# Patient Record
Sex: Female | Born: 1940 | Race: White | Hispanic: No | Smoking: Former smoker
Health system: Southern US, Community
[De-identification: ages and names within clinical notes are randomized; demographics above are authoritative.]

## PROBLEM LIST (undated history)

## (undated) DIAGNOSIS — J439 Emphysema, unspecified: Secondary | ICD-10-CM

## (undated) DIAGNOSIS — Z9981 Dependence on supplemental oxygen: Secondary | ICD-10-CM

## (undated) DIAGNOSIS — F329 Major depressive disorder, single episode, unspecified: Secondary | ICD-10-CM

## (undated) DIAGNOSIS — I10 Essential (primary) hypertension: Secondary | ICD-10-CM

## (undated) DIAGNOSIS — IMO0002 Reserved for concepts with insufficient information to code with codable children: Secondary | ICD-10-CM

## (undated) DIAGNOSIS — H9190 Unspecified hearing loss, unspecified ear: Secondary | ICD-10-CM

## (undated) DIAGNOSIS — C52 Malignant neoplasm of vagina: Secondary | ICD-10-CM

## (undated) DIAGNOSIS — K219 Gastro-esophageal reflux disease without esophagitis: Secondary | ICD-10-CM

## (undated) DIAGNOSIS — K449 Diaphragmatic hernia without obstruction or gangrene: Secondary | ICD-10-CM

## (undated) DIAGNOSIS — J449 Chronic obstructive pulmonary disease, unspecified: Secondary | ICD-10-CM

## (undated) DIAGNOSIS — R569 Unspecified convulsions: Secondary | ICD-10-CM

## (undated) DIAGNOSIS — D649 Anemia, unspecified: Secondary | ICD-10-CM

## (undated) DIAGNOSIS — E669 Obesity, unspecified: Secondary | ICD-10-CM

## (undated) DIAGNOSIS — F32A Depression, unspecified: Secondary | ICD-10-CM

## (undated) DIAGNOSIS — IMO0001 Reserved for inherently not codable concepts without codable children: Secondary | ICD-10-CM

## (undated) DIAGNOSIS — E785 Hyperlipidemia, unspecified: Secondary | ICD-10-CM

## (undated) DIAGNOSIS — I639 Cerebral infarction, unspecified: Secondary | ICD-10-CM

## (undated) DIAGNOSIS — K759 Inflammatory liver disease, unspecified: Secondary | ICD-10-CM

## (undated) DIAGNOSIS — K2270 Barrett's esophagus without dysplasia: Secondary | ICD-10-CM

## (undated) DIAGNOSIS — J189 Pneumonia, unspecified organism: Secondary | ICD-10-CM

## (undated) DIAGNOSIS — Z5189 Encounter for other specified aftercare: Secondary | ICD-10-CM

## (undated) DIAGNOSIS — M1990 Unspecified osteoarthritis, unspecified site: Secondary | ICD-10-CM

## (undated) DIAGNOSIS — G4730 Sleep apnea, unspecified: Secondary | ICD-10-CM

## (undated) DIAGNOSIS — T7840XA Allergy, unspecified, initial encounter: Secondary | ICD-10-CM

## (undated) HISTORY — DX: Gastro-esophageal reflux disease without esophagitis: K21.9

## (undated) HISTORY — DX: Essential (primary) hypertension: I10

## (undated) HISTORY — DX: Unspecified convulsions: R56.9

## (undated) HISTORY — DX: Chronic obstructive pulmonary disease, unspecified: J44.9

## (undated) HISTORY — DX: Depression, unspecified: F32.A

## (undated) HISTORY — DX: Diaphragmatic hernia without obstruction or gangrene: K44.9

## (undated) HISTORY — DX: Barrett's esophagus without dysplasia: K22.70

## (undated) HISTORY — DX: Inflammatory liver disease, unspecified: K75.9

## (undated) HISTORY — DX: Major depressive disorder, single episode, unspecified: F32.9

## (undated) HISTORY — PX: CHOLECYSTECTOMY: SHX55

## (undated) HISTORY — DX: Hyperlipidemia, unspecified: E78.5

## (undated) HISTORY — PX: HEMORRHOID SURGERY: SHX153

## (undated) HISTORY — DX: Reserved for concepts with insufficient information to code with codable children: IMO0002

## (undated) HISTORY — DX: Emphysema, unspecified: J43.9

## (undated) HISTORY — DX: Encounter for other specified aftercare: Z51.89

## (undated) HISTORY — DX: Pneumonia, unspecified organism: J18.9

## (undated) HISTORY — DX: Unspecified osteoarthritis, unspecified site: M19.90

## (undated) HISTORY — PX: ABDOMINAL HYSTERECTOMY: SHX81

## (undated) HISTORY — DX: Obesity, unspecified: E66.9

## (undated) HISTORY — DX: Cerebral infarction, unspecified: I63.9

## (undated) HISTORY — PX: JOINT REPLACEMENT: SHX530

## (undated) HISTORY — PX: TOTAL HIP ARTHROPLASTY: SHX124

## (undated) HISTORY — PX: OTHER SURGICAL HISTORY: SHX169

## (undated) HISTORY — DX: Sleep apnea, unspecified: G47.30

## (undated) HISTORY — DX: Reserved for inherently not codable concepts without codable children: IMO0001

## (undated) HISTORY — DX: Malignant neoplasm of vagina: C52

## (undated) HISTORY — PX: ORIF HIP FRACTURE: SHX2125

## (undated) HISTORY — DX: Allergy, unspecified, initial encounter: T78.40XA

## (undated) HISTORY — DX: Anemia, unspecified: D64.9

---

## 1996-07-28 HISTORY — PX: OTHER SURGICAL HISTORY: SHX169

## 2003-05-23 ENCOUNTER — Inpatient Hospital Stay (HOSPITAL_COMMUNITY): Admission: EM | Admit: 2003-05-23 | Discharge: 2003-06-02 | Payer: Self-pay | Admitting: *Deleted

## 2003-07-04 ENCOUNTER — Ambulatory Visit (HOSPITAL_COMMUNITY): Admission: RE | Admit: 2003-07-04 | Discharge: 2003-07-04 | Payer: Self-pay | Admitting: Endocrinology

## 2003-10-27 ENCOUNTER — Emergency Department (HOSPITAL_COMMUNITY): Admission: EM | Admit: 2003-10-27 | Discharge: 2003-10-27 | Payer: Self-pay | Admitting: Emergency Medicine

## 2004-07-18 ENCOUNTER — Ambulatory Visit: Payer: Self-pay | Admitting: Internal Medicine

## 2004-07-18 ENCOUNTER — Inpatient Hospital Stay (HOSPITAL_COMMUNITY): Admission: AD | Admit: 2004-07-18 | Discharge: 2004-07-21 | Payer: Self-pay | Admitting: Internal Medicine

## 2004-07-19 ENCOUNTER — Encounter (INDEPENDENT_AMBULATORY_CARE_PROVIDER_SITE_OTHER): Payer: Self-pay | Admitting: *Deleted

## 2004-07-19 ENCOUNTER — Encounter: Payer: Self-pay | Admitting: Gastroenterology

## 2004-07-19 ENCOUNTER — Encounter: Payer: Self-pay | Admitting: Internal Medicine

## 2004-07-19 DIAGNOSIS — K2270 Barrett's esophagus without dysplasia: Secondary | ICD-10-CM

## 2004-07-19 HISTORY — DX: Barrett's esophagus without dysplasia: K22.70

## 2004-07-20 ENCOUNTER — Encounter: Payer: Self-pay | Admitting: Internal Medicine

## 2004-08-01 ENCOUNTER — Ambulatory Visit: Payer: Self-pay | Admitting: Internal Medicine

## 2004-08-23 ENCOUNTER — Ambulatory Visit: Payer: Self-pay | Admitting: Internal Medicine

## 2004-11-22 ENCOUNTER — Ambulatory Visit: Payer: Self-pay | Admitting: Internal Medicine

## 2004-11-28 ENCOUNTER — Ambulatory Visit: Payer: Self-pay | Admitting: Internal Medicine

## 2004-12-16 ENCOUNTER — Ambulatory Visit: Payer: Self-pay | Admitting: Internal Medicine

## 2005-01-02 ENCOUNTER — Encounter: Admission: RE | Admit: 2005-01-02 | Discharge: 2005-01-02 | Payer: Self-pay | Admitting: Orthopedic Surgery

## 2005-01-08 ENCOUNTER — Ambulatory Visit: Payer: Self-pay | Admitting: Internal Medicine

## 2005-01-16 ENCOUNTER — Encounter: Admission: RE | Admit: 2005-01-16 | Discharge: 2005-01-16 | Payer: Self-pay | Admitting: Orthopedic Surgery

## 2005-02-21 ENCOUNTER — Ambulatory Visit: Payer: Self-pay | Admitting: Internal Medicine

## 2005-03-25 ENCOUNTER — Ambulatory Visit: Payer: Self-pay | Admitting: Internal Medicine

## 2005-05-29 ENCOUNTER — Ambulatory Visit: Payer: Self-pay | Admitting: Internal Medicine

## 2005-06-30 ENCOUNTER — Ambulatory Visit: Payer: Self-pay | Admitting: Internal Medicine

## 2005-07-02 ENCOUNTER — Encounter: Admission: RE | Admit: 2005-07-02 | Discharge: 2005-07-02 | Payer: Self-pay | Admitting: Internal Medicine

## 2005-07-11 ENCOUNTER — Ambulatory Visit: Payer: Self-pay | Admitting: Internal Medicine

## 2005-08-13 ENCOUNTER — Ambulatory Visit: Payer: Self-pay | Admitting: Internal Medicine

## 2005-08-22 ENCOUNTER — Ambulatory Visit: Payer: Self-pay | Admitting: Internal Medicine

## 2005-09-10 ENCOUNTER — Ambulatory Visit: Payer: Self-pay | Admitting: Internal Medicine

## 2005-09-24 ENCOUNTER — Ambulatory Visit: Payer: Self-pay | Admitting: Internal Medicine

## 2005-11-25 ENCOUNTER — Ambulatory Visit: Payer: Self-pay | Admitting: Internal Medicine

## 2005-11-28 ENCOUNTER — Ambulatory Visit: Payer: Self-pay

## 2005-12-04 ENCOUNTER — Ambulatory Visit (HOSPITAL_COMMUNITY): Admission: RE | Admit: 2005-12-04 | Discharge: 2005-12-04 | Payer: Self-pay | Admitting: Internal Medicine

## 2005-12-12 ENCOUNTER — Ambulatory Visit: Payer: Self-pay | Admitting: Internal Medicine

## 2005-12-12 ENCOUNTER — Inpatient Hospital Stay (HOSPITAL_COMMUNITY): Admission: RE | Admit: 2005-12-12 | Discharge: 2005-12-24 | Payer: Self-pay | Admitting: Orthopedic Surgery

## 2006-02-27 ENCOUNTER — Ambulatory Visit: Payer: Self-pay | Admitting: Internal Medicine

## 2006-04-21 ENCOUNTER — Ambulatory Visit: Payer: Self-pay | Admitting: Internal Medicine

## 2006-05-04 ENCOUNTER — Ambulatory Visit: Payer: Self-pay | Admitting: Internal Medicine

## 2006-05-04 DIAGNOSIS — M810 Age-related osteoporosis without current pathological fracture: Secondary | ICD-10-CM

## 2006-05-04 DIAGNOSIS — K219 Gastro-esophageal reflux disease without esophagitis: Secondary | ICD-10-CM

## 2006-05-04 DIAGNOSIS — I10 Essential (primary) hypertension: Secondary | ICD-10-CM | POA: Insufficient documentation

## 2006-05-04 DIAGNOSIS — R569 Unspecified convulsions: Secondary | ICD-10-CM | POA: Insufficient documentation

## 2006-05-04 DIAGNOSIS — E785 Hyperlipidemia, unspecified: Secondary | ICD-10-CM

## 2006-05-04 DIAGNOSIS — F329 Major depressive disorder, single episode, unspecified: Secondary | ICD-10-CM

## 2006-07-14 ENCOUNTER — Ambulatory Visit: Payer: Self-pay | Admitting: Internal Medicine

## 2006-07-14 LAB — CONVERTED CEMR LAB
ALT: 10 units/L (ref 0–40)
AST: 18 units/L (ref 0–37)
Albumin: 3.4 g/dL — ABNORMAL LOW (ref 3.5–5.2)
Alkaline Phosphatase: 75 units/L (ref 39–117)
BUN: 13 mg/dL (ref 6–23)
Basophils Absolute: 0.1 10*3/uL (ref 0.0–0.1)
Basophils Relative: 0.6 % (ref 0.0–1.0)
Bilirubin, Direct: 0.1 mg/dL (ref 0.0–0.3)
CO2: 34 meq/L — ABNORMAL HIGH (ref 19–32)
Calcium: 9.2 mg/dL (ref 8.4–10.5)
Chloride: 94 meq/L — ABNORMAL LOW (ref 96–112)
Chol/HDL Ratio, serum: 7.9
Cholesterol: 254 mg/dL (ref 0–200)
Creatinine, Ser: 0.8 mg/dL (ref 0.4–1.2)
Eosinophil percent: 1.6 % (ref 0.0–5.0)
GFR calc non Af Amer: 77 mL/min
Glomerular Filtration Rate, Af Am: 93 mL/min/{1.73_m2}
Glucose, Bld: 102 mg/dL — ABNORMAL HIGH (ref 70–99)
HCT: 37.7 % (ref 36.0–46.0)
HDL: 32.2 mg/dL — ABNORMAL LOW (ref >39.0)
Hemoglobin: 12.1 g/dL (ref 12.0–15.0)
LDL DIRECT: 141.5 mg/dL
Lymphocytes Relative: 33.7 % (ref 12.0–46.0)
MCHC: 32.2 g/dL (ref 30.0–36.0)
MCV: 92.4 fL (ref 78.0–100.0)
Monocytes Absolute: 0.8 10*3/uL — ABNORMAL HIGH (ref 0.2–0.7)
Monocytes Relative: 8.2 % (ref 3.0–11.0)
Neutro Abs: 5.2 10*3/uL (ref 1.4–7.7)
Neutrophils Relative %: 55.9 % (ref 43.0–77.0)
Platelets: 330 10*3/uL (ref 150–400)
Potassium: 3 meq/L — ABNORMAL LOW (ref 3.5–5.1)
RBC: 4.08 M/uL (ref 3.87–5.11)
RDW: 14 % (ref 11.5–14.6)
Sodium: 139 meq/L (ref 135–145)
Total Bilirubin: 0.5 mg/dL (ref 0.3–1.2)
Total Protein: 6.6 g/dL (ref 6.0–8.3)
Triglyceride fasting, serum: 384 mg/dL (ref 0–149)
VLDL: 77 mg/dL — ABNORMAL HIGH (ref 0–40)
WBC: 9.5 10*3/uL (ref 4.5–10.5)

## 2006-11-10 ENCOUNTER — Ambulatory Visit: Payer: Self-pay | Admitting: Internal Medicine

## 2006-12-18 ENCOUNTER — Ambulatory Visit: Payer: Self-pay | Admitting: Internal Medicine

## 2007-02-06 ENCOUNTER — Emergency Department (HOSPITAL_COMMUNITY): Admission: EM | Admit: 2007-02-06 | Discharge: 2007-02-06 | Payer: Self-pay | Admitting: Emergency Medicine

## 2007-03-08 ENCOUNTER — Encounter: Payer: Self-pay | Admitting: Internal Medicine

## 2007-03-15 ENCOUNTER — Ambulatory Visit: Payer: Self-pay | Admitting: Internal Medicine

## 2007-03-18 ENCOUNTER — Ambulatory Visit: Payer: Self-pay | Admitting: Cardiology

## 2007-07-08 ENCOUNTER — Ambulatory Visit: Payer: Self-pay | Admitting: Internal Medicine

## 2007-07-13 LAB — CONVERTED CEMR LAB
ALT: 10 units/L (ref 0–35)
AST: 17 units/L (ref 0–37)
Albumin: 3.8 g/dL (ref 3.5–5.2)
Alkaline Phosphatase: 70 units/L (ref 39–117)
BUN: 17 mg/dL (ref 6–23)
Basophils Absolute: 0.1 10*3/uL (ref 0.0–0.1)
Basophils Relative: 0.6 % (ref 0.0–1.0)
Bilirubin, Direct: 0.1 mg/dL (ref 0.0–0.3)
CO2: 34 meq/L — ABNORMAL HIGH (ref 19–32)
Calcium: 9.3 mg/dL (ref 8.4–10.5)
Chloride: 99 meq/L (ref 96–112)
Cholesterol: 243 mg/dL (ref 0–200)
Creatinine, Ser: 0.7 mg/dL (ref 0.4–1.2)
Direct LDL: 142.6 mg/dL
Eosinophils Absolute: 0.1 10*3/uL (ref 0.0–0.6)
Eosinophils Relative: 1.4 % (ref 0.0–5.0)
GFR calc Af Amer: 108 mL/min
GFR calc non Af Amer: 90 mL/min
Glucose, Bld: 70 mg/dL (ref 70–99)
HCT: 32.2 % — ABNORMAL LOW (ref 36.0–46.0)
HDL: 27.1 mg/dL — ABNORMAL LOW (ref >39.0)
Hemoglobin: 10.4 g/dL — ABNORMAL LOW (ref 12.0–15.0)
Lymphocytes Relative: 44.2 % (ref 12.0–46.0)
MCHC: 32.2 g/dL (ref 30.0–36.0)
MCV: 84.1 fL (ref 78.0–100.0)
Monocytes Absolute: 0.7 10*3/uL (ref 0.2–0.7)
Monocytes Relative: 8.5 % (ref 3.0–11.0)
Neutro Abs: 3.8 10*3/uL (ref 1.4–7.7)
Neutrophils Relative %: 45.3 % (ref 43.0–77.0)
Platelets: 412 10*3/uL — ABNORMAL HIGH (ref 150–400)
Potassium: 4.1 meq/L (ref 3.5–5.1)
RBC: 3.82 M/uL — ABNORMAL LOW (ref 3.87–5.11)
RDW: 16.3 % — ABNORMAL HIGH (ref 11.5–14.6)
Sodium: 143 meq/L (ref 135–145)
TSH: 1.84 microintl units/mL (ref 0.35–5.50)
Total Bilirubin: 0.4 mg/dL (ref 0.3–1.2)
Total CHOL/HDL Ratio: 9
Total Protein: 7.3 g/dL (ref 6.0–8.3)
Triglycerides: 338 mg/dL (ref 0–149)
VLDL: 68 mg/dL — ABNORMAL HIGH (ref 0–40)
WBC: 8.4 10*3/uL (ref 4.5–10.5)

## 2007-08-06 ENCOUNTER — Ambulatory Visit: Payer: Self-pay | Admitting: Internal Medicine

## 2007-08-06 LAB — CONVERTED CEMR LAB
Ferritin: 11.7 ng/mL (ref 10.0–291.0)
Iron: 28 ug/dL — ABNORMAL LOW (ref 42–145)
Saturation Ratios: 5.5 % — ABNORMAL LOW (ref 20.0–50.0)
Vitamin B-12: 150 pg/mL — ABNORMAL LOW (ref 211–911)

## 2007-08-13 ENCOUNTER — Telehealth: Payer: Self-pay | Admitting: Internal Medicine

## 2007-08-13 ENCOUNTER — Ambulatory Visit: Payer: Self-pay | Admitting: Internal Medicine

## 2007-08-13 DIAGNOSIS — E538 Deficiency of other specified B group vitamins: Secondary | ICD-10-CM | POA: Insufficient documentation

## 2007-08-16 ENCOUNTER — Ambulatory Visit: Payer: Self-pay | Admitting: Family Medicine

## 2007-08-16 ENCOUNTER — Encounter: Payer: Self-pay | Admitting: Internal Medicine

## 2007-08-27 ENCOUNTER — Ambulatory Visit: Payer: Self-pay | Admitting: Internal Medicine

## 2007-09-22 ENCOUNTER — Ambulatory Visit: Payer: Self-pay | Admitting: Internal Medicine

## 2007-12-09 ENCOUNTER — Encounter: Payer: Self-pay | Admitting: Internal Medicine

## 2007-12-24 ENCOUNTER — Ambulatory Visit: Payer: Self-pay | Admitting: Internal Medicine

## 2007-12-24 LAB — CONVERTED CEMR LAB
Bilirubin Urine: NEGATIVE
Nitrite: NEGATIVE
Specific Gravity, Urine: 1.015
Urobilinogen, UA: NEGATIVE

## 2008-01-20 ENCOUNTER — Ambulatory Visit: Payer: Self-pay | Admitting: Internal Medicine

## 2008-01-20 LAB — CONVERTED CEMR LAB
Basophils Absolute: 0.1 10*3/uL (ref 0.0–0.1)
Calcium: 9.3 mg/dL (ref 8.4–10.5)
Eosinophils Absolute: 0.1 10*3/uL (ref 0.0–0.7)
Folate: 5.1 ng/mL
GFR calc Af Amer: 93 mL/min
GFR calc non Af Amer: 77 mL/min
Lymphocytes Relative: 35.8 % (ref 12.0–46.0)
MCHC: 32.2 g/dL (ref 30.0–36.0)
MCV: 90.1 fL (ref 78.0–100.0)
Neutrophils Relative %: 53.5 % (ref 43.0–77.0)
Platelets: 343 10*3/uL (ref 150–400)
Sodium: 143 meq/L (ref 135–145)
Vitamin B-12: 256 pg/mL (ref 211–911)

## 2008-04-28 ENCOUNTER — Encounter: Payer: Self-pay | Admitting: Internal Medicine

## 2008-05-23 ENCOUNTER — Ambulatory Visit: Payer: Self-pay | Admitting: Internal Medicine

## 2008-05-25 LAB — CONVERTED CEMR LAB
ALT: 13 units/L (ref 0–35)
Albumin: 3.7 g/dL (ref 3.5–5.2)
Bilirubin, Direct: 0.1 mg/dL (ref 0.0–0.3)
Cholesterol: 278 mg/dL (ref 0–200)
HCT: 34.8 % — ABNORMAL LOW (ref 36.0–46.0)
MCHC: 32.7 g/dL (ref 30.0–36.0)
MCV: 87.7 fL (ref 78.0–100.0)
Platelets: 344 10*3/uL (ref 150–400)
RDW: 16.9 % — ABNORMAL HIGH (ref 11.5–14.6)
Total Protein: 8.4 g/dL — ABNORMAL HIGH (ref 6.0–8.3)
Triglycerides: 483 mg/dL (ref 0–149)
WBC: 10.4 10*3/uL (ref 4.5–10.5)

## 2008-06-13 ENCOUNTER — Ambulatory Visit: Payer: Self-pay | Admitting: Family Medicine

## 2008-06-13 LAB — CONVERTED CEMR LAB
Glucose, Urine, Semiquant: NEGATIVE
pH: 5.5

## 2008-06-27 ENCOUNTER — Ambulatory Visit: Payer: Self-pay | Admitting: Family Medicine

## 2008-06-27 LAB — CONVERTED CEMR LAB
Nitrite: NEGATIVE
WBC Urine, dipstick: NEGATIVE

## 2008-09-15 ENCOUNTER — Telehealth: Payer: Self-pay | Admitting: Internal Medicine

## 2008-10-09 ENCOUNTER — Ambulatory Visit: Payer: Self-pay | Admitting: Internal Medicine

## 2008-10-11 LAB — CONVERTED CEMR LAB
Alkaline Phosphatase: 70 units/L (ref 39–117)
Bilirubin, Direct: 0.1 mg/dL (ref 0.0–0.3)
GFR calc Af Amer: 93 mL/min
GFR calc non Af Amer: 77 mL/min
HDL: 29.4 mg/dL — ABNORMAL LOW (ref >39.0)
Potassium: 3.8 meq/L (ref 3.5–5.1)
Sodium: 142 meq/L (ref 135–145)
VLDL: 49 mg/dL — ABNORMAL HIGH (ref 0–40)

## 2009-01-10 ENCOUNTER — Ambulatory Visit: Payer: Self-pay | Admitting: Internal Medicine

## 2009-01-10 LAB — CONVERTED CEMR LAB
Bilirubin Urine: NEGATIVE
Glucose, Urine, Semiquant: NEGATIVE
pH: 5.5

## 2009-04-11 ENCOUNTER — Ambulatory Visit: Payer: Self-pay | Admitting: Internal Medicine

## 2009-04-11 LAB — CONVERTED CEMR LAB
Blood in Urine, dipstick: NEGATIVE
Glucose, Urine, Semiquant: NEGATIVE
Ketones, urine, test strip: NEGATIVE
Specific Gravity, Urine: 1.025
WBC Urine, dipstick: NEGATIVE
pH: 5

## 2009-04-17 ENCOUNTER — Encounter: Admission: RE | Admit: 2009-04-17 | Discharge: 2009-05-17 | Payer: Self-pay | Admitting: Internal Medicine

## 2009-04-17 ENCOUNTER — Encounter: Payer: Self-pay | Admitting: Internal Medicine

## 2009-04-25 LAB — CONVERTED CEMR LAB
BUN: 20 mg/dL (ref 6–23)
Basophils Absolute: 0.1 10*3/uL (ref 0.0–0.1)
Basophils Relative: 0.9 % (ref 0.0–3.0)
CO2: 32 meq/L (ref 19–32)
Calcium: 9.1 mg/dL (ref 8.4–10.5)
Chloride: 104 meq/L (ref 96–112)
Cholesterol: 204 mg/dL — ABNORMAL HIGH (ref 0–200)
Creatinine, Ser: 1 mg/dL (ref 0.4–1.2)
Direct LDL: 127.2 mg/dL
Eosinophils Absolute: 0.2 10*3/uL (ref 0.0–0.7)
Eosinophils Relative: 2.6 % (ref 0.0–5.0)
GFR calc non Af Amer: 58.96 mL/min (ref >60)
Glucose, Bld: 85 mg/dL (ref 70–99)
HCT: 31 % — ABNORMAL LOW (ref 36.0–46.0)
HDL: 33.7 mg/dL — ABNORMAL LOW (ref >39.00)
Hemoglobin: 9.7 g/dL — ABNORMAL LOW (ref 12.0–15.0)
Lymphocytes Relative: 40.4 % (ref 12.0–46.0)
Lymphs Abs: 3.2 10*3/uL (ref 0.7–4.0)
MCHC: 31.4 g/dL (ref 30.0–36.0)
MCV: 85 fL (ref 78.0–100.0)
Monocytes Absolute: 0.7 10*3/uL (ref 0.1–1.0)
Monocytes Relative: 8.8 % (ref 3.0–12.0)
Neutro Abs: 3.7 10*3/uL (ref 1.4–7.7)
Neutrophils Relative %: 47.3 % (ref 43.0–77.0)
Platelets: 292 10*3/uL (ref 150.0–400.0)
Potassium: 4.1 meq/L (ref 3.5–5.1)
RBC: 3.65 M/uL — ABNORMAL LOW (ref 3.87–5.11)
RDW: 19 % — ABNORMAL HIGH (ref 11.5–14.6)
Sodium: 143 meq/L (ref 135–145)
Total CHOL/HDL Ratio: 6
Triglycerides: 236 mg/dL — ABNORMAL HIGH (ref 0.0–149.0)
VLDL: 47.2 mg/dL — ABNORMAL HIGH (ref 0.0–40.0)
WBC: 7.9 10*3/uL (ref 4.5–10.5)

## 2009-04-30 ENCOUNTER — Ambulatory Visit: Payer: Self-pay | Admitting: Internal Medicine

## 2009-05-07 DIAGNOSIS — D509 Iron deficiency anemia, unspecified: Secondary | ICD-10-CM

## 2009-05-07 LAB — CONVERTED CEMR LAB
Eosinophils Absolute: 0.2 10*3/uL (ref 0.0–0.7)
HCT: 31 % — ABNORMAL LOW (ref 36.0–46.0)
Iron: 18 ug/dL — ABNORMAL LOW (ref 42–145)
Lymphs Abs: 3.5 10*3/uL (ref 0.7–4.0)
MCHC: 31.9 g/dL (ref 30.0–36.0)
MCV: 85.4 fL (ref 78.0–100.0)
Monocytes Absolute: 0.7 10*3/uL (ref 0.1–1.0)
Neutrophils Relative %: 52.7 % (ref 43.0–77.0)
Platelets: 374 10*3/uL (ref 150.0–400.0)
Transferrin: 326.5 mg/dL (ref 212.0–360.0)

## 2009-06-04 ENCOUNTER — Ambulatory Visit: Payer: Self-pay | Admitting: Gastroenterology

## 2009-06-04 DIAGNOSIS — K2270 Barrett's esophagus without dysplasia: Secondary | ICD-10-CM

## 2009-06-05 ENCOUNTER — Encounter: Payer: Self-pay | Admitting: Gastroenterology

## 2009-06-05 ENCOUNTER — Telehealth: Payer: Self-pay | Admitting: Gastroenterology

## 2009-06-05 ENCOUNTER — Ambulatory Visit: Payer: Self-pay | Admitting: Gastroenterology

## 2009-06-07 ENCOUNTER — Encounter: Payer: Self-pay | Admitting: Gastroenterology

## 2009-06-08 ENCOUNTER — Encounter: Payer: Self-pay | Admitting: Gastroenterology

## 2009-06-12 ENCOUNTER — Ambulatory Visit: Payer: Self-pay | Admitting: Internal Medicine

## 2009-06-12 LAB — CONVERTED CEMR LAB: Blood Glucose, Fingerstick: 98

## 2009-06-15 ENCOUNTER — Telehealth: Payer: Self-pay | Admitting: Gastroenterology

## 2009-06-19 ENCOUNTER — Ambulatory Visit: Payer: Self-pay | Admitting: Gastroenterology

## 2009-06-20 ENCOUNTER — Ambulatory Visit: Payer: Self-pay | Admitting: Gastroenterology

## 2009-06-20 LAB — CONVERTED CEMR LAB: Fecal Occult Bld: NEGATIVE

## 2009-06-25 ENCOUNTER — Telehealth: Payer: Self-pay | Admitting: Internal Medicine

## 2009-06-25 ENCOUNTER — Ambulatory Visit: Payer: Self-pay | Admitting: Internal Medicine

## 2009-06-25 LAB — CONVERTED CEMR LAB
Bilirubin Urine: NEGATIVE
Ketones, urine, test strip: NEGATIVE
Nitrite: NEGATIVE
Specific Gravity, Urine: 1.025
Urobilinogen, UA: 0.2

## 2009-06-26 ENCOUNTER — Encounter: Payer: Self-pay | Admitting: Internal Medicine

## 2009-06-27 ENCOUNTER — Telehealth: Payer: Self-pay | Admitting: Gastroenterology

## 2009-06-28 ENCOUNTER — Encounter: Payer: Self-pay | Admitting: Gastroenterology

## 2009-07-06 ENCOUNTER — Ambulatory Visit: Payer: Self-pay | Admitting: Gastroenterology

## 2009-07-16 ENCOUNTER — Ambulatory Visit: Payer: Self-pay | Admitting: Internal Medicine

## 2009-07-25 ENCOUNTER — Ambulatory Visit: Payer: Self-pay | Admitting: Gastroenterology

## 2009-07-25 LAB — CONVERTED CEMR LAB
Basophils Relative: 1.4 % (ref 0.0–3.0)
Eosinophils Relative: 1.7 % (ref 0.0–5.0)
Hemoglobin: 10.5 g/dL — ABNORMAL LOW (ref 12.0–15.0)
Lymphocytes Relative: 40.5 % (ref 12.0–46.0)
MCHC: 32.1 g/dL (ref 30.0–36.0)
Neutro Abs: 5.1 10*3/uL (ref 1.4–7.7)
Neutrophils Relative %: 47.2 % (ref 43.0–77.0)
RBC: 3.81 M/uL — ABNORMAL LOW (ref 3.87–5.11)
WBC: 10.9 10*3/uL — ABNORMAL HIGH (ref 4.5–10.5)

## 2009-09-11 ENCOUNTER — Ambulatory Visit: Payer: Self-pay | Admitting: Internal Medicine

## 2009-10-15 ENCOUNTER — Ambulatory Visit: Payer: Self-pay | Admitting: Internal Medicine

## 2009-11-12 ENCOUNTER — Ambulatory Visit: Payer: Self-pay | Admitting: Internal Medicine

## 2009-12-12 ENCOUNTER — Ambulatory Visit: Payer: Self-pay | Admitting: Internal Medicine

## 2009-12-27 ENCOUNTER — Telehealth: Payer: Self-pay | Admitting: Internal Medicine

## 2009-12-28 ENCOUNTER — Ambulatory Visit: Payer: Self-pay | Admitting: Internal Medicine

## 2009-12-29 ENCOUNTER — Encounter: Payer: Self-pay | Admitting: Internal Medicine

## 2010-01-31 ENCOUNTER — Ambulatory Visit: Payer: Self-pay | Admitting: Internal Medicine

## 2010-01-31 LAB — CONVERTED CEMR LAB
Bilirubin Urine: NEGATIVE
Ketones, urine, test strip: NEGATIVE
Nitrite: NEGATIVE
Urobilinogen, UA: 0.2

## 2010-02-01 ENCOUNTER — Encounter: Payer: Self-pay | Admitting: Internal Medicine

## 2010-02-03 LAB — CONVERTED CEMR LAB
Alkaline Phosphatase: 58 units/L (ref 39–117)
BUN: 23 mg/dL (ref 6–23)
Basophils Relative: 0.6 % (ref 0.0–3.0)
Bilirubin, Direct: 0.1 mg/dL (ref 0.0–0.3)
CO2: 28 meq/L (ref 19–32)
Chloride: 104 meq/L (ref 96–112)
Eosinophils Absolute: 0.2 10*3/uL (ref 0.0–0.7)
GFR calc non Af Amer: 64.76 mL/min (ref >60)
Glucose, Bld: 86 mg/dL (ref 70–99)
HCT: 28.9 % — ABNORMAL LOW (ref 36.0–46.0)
HDL: 36.4 mg/dL — ABNORMAL LOW (ref >39.00)
Hemoglobin: 9.1 g/dL — ABNORMAL LOW (ref 12.0–15.0)
Lymphocytes Relative: 45.1 % (ref 12.0–46.0)
Lymphs Abs: 3.4 10*3/uL (ref 0.7–4.0)
MCHC: 31.3 g/dL (ref 30.0–36.0)
Monocytes Relative: 8.9 % (ref 3.0–12.0)
Neutro Abs: 3.2 10*3/uL (ref 1.4–7.7)
Potassium: 4.6 meq/L (ref 3.5–5.1)
RBC: 3.54 M/uL — ABNORMAL LOW (ref 3.87–5.11)
Sodium: 143 meq/L (ref 135–145)
Total CHOL/HDL Ratio: 7
Total Protein: 7.3 g/dL (ref 6.0–8.3)
VLDL: 77.4 mg/dL — ABNORMAL HIGH (ref 0.0–40.0)

## 2010-02-05 ENCOUNTER — Ambulatory Visit: Payer: Self-pay | Admitting: Internal Medicine

## 2010-02-07 ENCOUNTER — Encounter: Payer: Self-pay | Admitting: Internal Medicine

## 2010-02-07 LAB — CONVERTED CEMR LAB
Ferritin: 5.9 ng/mL — ABNORMAL LOW (ref 10.0–291.0)
Iron: 27 ug/dL — ABNORMAL LOW (ref 42–145)
Saturation Ratios: 6.1 % — ABNORMAL LOW (ref 20.0–50.0)
Vitamin B-12: 739 pg/mL (ref 211–911)

## 2010-02-14 ENCOUNTER — Encounter (HOSPITAL_COMMUNITY): Admission: RE | Admit: 2010-02-14 | Discharge: 2010-02-15 | Payer: Self-pay | Admitting: Internal Medicine

## 2010-03-04 ENCOUNTER — Ambulatory Visit: Payer: Self-pay | Admitting: Internal Medicine

## 2010-03-15 ENCOUNTER — Telehealth: Payer: Self-pay | Admitting: Internal Medicine

## 2010-03-25 ENCOUNTER — Encounter: Payer: Self-pay | Admitting: Internal Medicine

## 2010-04-04 ENCOUNTER — Ambulatory Visit: Payer: Self-pay | Admitting: Internal Medicine

## 2010-04-08 ENCOUNTER — Telehealth: Payer: Self-pay | Admitting: Internal Medicine

## 2010-04-16 ENCOUNTER — Encounter: Payer: Self-pay | Admitting: Internal Medicine

## 2010-04-24 ENCOUNTER — Ambulatory Visit: Payer: Self-pay | Admitting: Family Medicine

## 2010-04-24 LAB — CONVERTED CEMR LAB
Nitrite: NEGATIVE
Urobilinogen, UA: 0.2
pH: 5

## 2010-04-25 ENCOUNTER — Encounter: Payer: Self-pay | Admitting: Internal Medicine

## 2010-06-04 ENCOUNTER — Ambulatory Visit: Payer: Self-pay | Admitting: Internal Medicine

## 2010-06-07 ENCOUNTER — Ambulatory Visit: Payer: Self-pay | Admitting: Cardiology

## 2010-06-10 ENCOUNTER — Encounter: Payer: Self-pay | Admitting: Internal Medicine

## 2010-06-11 LAB — CONVERTED CEMR LAB
ALT: 11 units/L (ref 0–35)
Basophils Absolute: 0.1 10*3/uL (ref 0.0–0.1)
Bilirubin, Direct: 0 mg/dL (ref 0.0–0.3)
Calcium: 9.8 mg/dL (ref 8.4–10.5)
Eosinophils Absolute: 0.1 10*3/uL (ref 0.0–0.7)
GFR calc non Af Amer: 74.93 mL/min (ref >60)
Glucose, Bld: 82 mg/dL (ref 70–99)
HCT: 38 % (ref 36.0–46.0)
HDL: 36.4 mg/dL — ABNORMAL LOW (ref >39.00)
Lymphs Abs: 2.7 10*3/uL (ref 0.7–4.0)
MCHC: 31.6 g/dL (ref 30.0–36.0)
MCV: 90.9 fL (ref 78.0–100.0)
Monocytes Absolute: 0.8 10*3/uL (ref 0.1–1.0)
Platelets: 333 10*3/uL (ref 150.0–400.0)
RDW: 18 % — ABNORMAL HIGH (ref 11.5–14.6)
Sodium: 144 meq/L (ref 135–145)
Total Protein: 7.1 g/dL (ref 6.0–8.3)
Triglycerides: 320 mg/dL — ABNORMAL HIGH (ref 0.0–149.0)

## 2010-07-03 ENCOUNTER — Ambulatory Visit: Payer: Self-pay | Admitting: Internal Medicine

## 2010-07-31 ENCOUNTER — Ambulatory Visit
Admission: RE | Admit: 2010-07-31 | Discharge: 2010-07-31 | Payer: Self-pay | Source: Home / Self Care | Attending: Internal Medicine | Admitting: Internal Medicine

## 2010-08-17 ENCOUNTER — Encounter: Payer: Self-pay | Admitting: Endocrinology

## 2010-08-27 NOTE — Op Note (Signed)
Summary: [facility]/[place] [facility]  [facility]/[place] [facility]   Imported By: Maryln Gottron 02/11/2010 15:02:03  _____________________________________________________________________  External Attachment:    Type:   Image     Comment:   External Document

## 2010-08-27 NOTE — Assessment & Plan Note (Signed)
Summary: ?bladder inf/sinus problem/njr/pt rescd per dr Halley Shepheard//ccm   Vital Signs:  Patient profile:   70 year old female Weight:      181 pounds Temp:     97.8 degrees F oral BP sitting:   120 / 70  (left arm)  Vitals Entered By: Kathrynn Speed CMA (December 28, 2009 9:55 AM) CC: Bladder Inf / Sinus Problems / COPD breathing problems   Primary Care Provider:  Birdie Sons, MD  CC:  Bladder Inf / Sinus Problems / COPD breathing problems.  History of Present Illness: several complaints Sinus congestion and wheezing for one week. no SOB. Says that her breathing feels "cut off at times".  no fever , chills cough, non productive  Complains of dysuria, urgency for one week no back pain, feer, chills  All other systems reviewed and were negative   Current Problems (verified): 1)  Barretts Esophagus  (ICD-530.85) 2)  Anemia, Iron Deficiency  (ICD-280.9) 3)  Vitamin B12 Deficiency  (ICD-266.2) 4)  Seizure Disorder  (ICD-780.39) 5)  Osteoporosis  (ICD-733.00) 6)  Hypertension  (ICD-401.9) 7)  Hyperlipidemia  (ICD-272.4) 8)  Gerd  (ICD-530.81) 9)  Depression  (ICD-311) 10)  COPD  (ICD-496)  Current Medications (verified): 1)  Lisinopril-Hydrochlorothiazide 20-25 Mg Tabs (Lisinopril-Hydrochlorothiazide) .Marland Kitchen.. 1 By Mouth Once Daily 2)  Omeprazole 20 Mg Cpdr (Omeprazole) .Marland Kitchen.. 1 By Mouth Once Daily 3)  Albuterol 90 Mcg/act Aers (Albuterol) .... Inhale 2 Puff As Directed Four Times A  Day As Needed 4)  Trazodone Hcl 100 Mg Tabs (Trazodone Hcl) .Marland Kitchen.. 1 By Mouth Once Daily 5)  Boniva 150 Mg Tabs (Ibandronate Sodium) .... Take 1 Tab By One Time Monthly 6)  Simvastatin 20 Mg Tabs (Simvastatin) .... One Tab Every Other Day 7)  Ferrous Sulfate 325 (65 Fe) Mg Tbec (Ferrous Sulfate) .... Take 1 Tablet By Mouth Three Times A Day 8)  Cyanocobalamin 1000 Mcg/ml Soln (Cyanocobalamin) .... Inject 1 Cc Evefry Month At Dr Cato Mulligan' Office 9)  Fluticasone Propionate 50 Mcg/act  Susp (Fluticasone  Propionate) .... 2 Sprays Each Nostril Once Daily  Allergies (verified): No Known Drug Allergies  Past History:  Past Medical History: Last updated: 06/04/2009 Allergic rhinitis COPD Depression GERD/barrett's esophagus Hyperlipidemia Hypertension Osteoporosis Seizure disorder ovarian cancer barrett's esophagus 07/19/2004 Anemia Obesity Pneumonia Stroke Sleep Apnea Other Hepatits  Past Surgical History: Last updated: 05/04/2006 hip fracture ORIF Cholecystectomy Hemorrhoidectomy Total hip replacement Hysterectomy Surgery for ? ovarian CA/cervical CA  Family History: Last updated: 07/25/2009 [place] Family History of Esophageal Cancer:Brother Family History of Stomach Cancer:Father Family History of Heart Disease: Mother Family History of Diabetes:Mother, Brother, Sister ,sons x2 No FH of Colon Cancer:  Social History: Last updated: 06/04/2009 Former Smoker Occupation: Retired Alcohol Use - no Daily Caffeine Use -6 Illicit Drug Use - no  Risk Factors: Smoking Status: quit > 6 months (09/11/2009)  Physical Exam  General:  Well-developed,well-nourished,in no acute distress; alert,appropriate and cooperative throughout examination Head:  normocephalic and atraumatic.   Eyes:  pupils equal and pupils round.   Ears:  R ear normal and L ear normal.   Neck:  No deformities, masses, or tenderness noted. Chest Wall:  No deformities, masses, or tenderness noted. Lungs:  normal respiratory effort and no accessory muscle use.   Heart:  normal rate and regular rhythm.   Abdomen:  Bowel sounds positive,abdomen soft and non-tender without masses, organomegaly or hernias noted. Genitalia:  no cva or suprapubic tenderness Msk:  No deformity or scoliosis noted of thoracic or  lumbar spine.   Neurologic:  cranial nerves II-XII intact and gait normal.     Impression & Recommendations:  Problem # 1:  UTI (ICD-599.0)  by hx trial ABX side effects discussed  Her updated  medication list for this problem includes:    Septra Ds 800-160 Mg Tabs (Sulfamethoxazole-trimethoprim) .Marland Kitchen... 1 by mouth twice daily  Orders: T-Culture, Urine ([id number])  Problem # 2:  URI (ICD-465.9) no evidence of bacterial infection. call for any concerns, increased sxs, fever, persistence of sxs, wheeze, SOB.    Complete Medication List: 1)  Lisinopril-hydrochlorothiazide 20-25 Mg Tabs (Lisinopril-hydrochlorothiazide) .Marland Kitchen.. 1 by mouth once daily 2)  Omeprazole 20 Mg Cpdr (Omeprazole) .Marland Kitchen.. 1 by mouth once daily 3)  Albuterol 90 Mcg/act Aers (Albuterol) .... Inhale 2 puff as directed four times a  day as needed 4)  Trazodone Hcl 100 Mg Tabs (Trazodone hcl) .Marland Kitchen.. 1 by mouth once daily 5)  Boniva 150 Mg Tabs (Ibandronate sodium) .... Take 1 tab by one time monthly 6)  Simvastatin 20 Mg Tabs (Simvastatin) .... One tab every other day 7)  Ferrous Sulfate 325 (65 Fe) Mg Tbec (Ferrous sulfate) .... Take 1 tablet by mouth three times a day 8)  Cyanocobalamin 1000 Mcg/ml Soln (Cyanocobalamin) .... Inject 1 cc evefry month at dr Lynda Wanninger' office 9)  Fluticasone Propionate 50 Mcg/act Susp (Fluticasone propionate) .... 2 sprays each nostril once daily 10)  Septra Ds 800-160 Mg Tabs (Sulfamethoxazole-trimethoprim) .Marland Kitchen.. 1 by mouth twice daily  Other Orders: UA Dipstick w/o Micro (automated)  (81003) Prescriptions: BONIVA 150 MG TABS (IBANDRONATE SODIUM) Take 1 tab by one time monthly  #3 x 3   Entered and Authorized by:   Birdie Sons MD   Signed by:   Birdie Sons MD on 12/28/2009   Method used:   Electronically to        [facility]  [address] [facility]* (retail)       [address]       [place], [place]  [address]       Ph: [phone] or [phone]       Fax: [phone]   RxID:   [id number] ALBUTEROL 90 MCG/ACT AERS (ALBUTEROL) Inhale 2 puff as directed four times a  day as needed  #1 x 0   Entered and Authorized by:   Birdie Sons MD   Signed by:   Birdie Sons  MD on 12/28/2009   Method used:   Electronically to        [facility]  [address] [facility]* (retail)       [address]       [place], [place]  [address]       Ph: [phone] or [phone]       Fax: [phone]   RxID:   [id number] SEPTRA DS 800-160 MG TABS (SULFAMETHOXAZOLE-TRIMETHOPRIM) 1 by mouth twice daily  #10 x 0   Entered and Authorized by:   Birdie Sons MD   Signed by:   Birdie Sons MD on 12/28/2009   Method used:   Electronically to        [facility]  [address] [facility]* (retail)       [address]       [place], [place]  [address]       Ph: [phone] or [phone]       Fax: [phone]  RxID:   [id number]   Appended Document: ?bladder inf/sinus problem/njr/pt rescd per dr Jazmarie Biever//ccm  Laboratory Results   Urine Tests    Routine Urinalysis   Color: yellow Appearance: Clear Glucose: negative   (Normal Range: Negative) Bilirubin: negative   (Normal Range: Negative) Ketone: negative   (Normal Range: Negative) Spec. Gravity: 1.020   (Normal Range: 1.003-1.035) Blood: negative   (Normal Range: Negative) pH: 5.0   (Normal Range: 5.0-8.0) Protein: negative   (Normal Range: Negative) Urobilinogen: 0.2   (Normal Range: 0-1) Nitrite: negative   (Normal Range: Negative) Leukocyte Esterace: negative   (Normal Range: Negative)    Comments: Rita Ohara  December 28, 2009 10:18 AM

## 2010-08-27 NOTE — Assessment & Plan Note (Signed)
Summary: B12 INJ/CB  Nurse Visit   Allergies: No Known Drug Allergies  Medication Administration  Injection # 1:    Medication: Vit B12 1000 mcg    Diagnosis: VITAMIN B12 DEFICIENCY (ICD-266.2)    Route: IM    Site: R deltoid    Exp Date: 08/29/2011    Lot #: [id number]    Mfr: American Regent    Patient tolerated injection without complications    Given by: Gladis Riffle, RN (March 04, 2010 10:04 AM)  Orders Added: 1)  Vit B12 1000 mcg [J3420] 2)  Admin of Therapeutic Inj  intramuscular or subcutaneous [96372]   Medication Administration  Injection # 1:    Medication: Vit B12 1000 mcg    Diagnosis: VITAMIN B12 DEFICIENCY (ICD-266.2)    Route: IM    Site: R deltoid    Exp Date: 08/29/2011    Lot #: [id number]    Mfr: American Regent    Patient tolerated injection without complications    Given by: Gladis Riffle, RN (March 04, 2010 10:04 AM)  Orders Added: 1)  Vit B12 1000 mcg [J3420] 2)  Admin of Therapeutic Inj  intramuscular or subcutaneous [[address]]

## 2010-08-27 NOTE — Miscellaneous (Signed)
Summary: Flu shot/[facility]  Flu shot/[facility]   Imported By: Maryln Gottron 05/02/2010 10:53:22  _____________________________________________________________________  External Attachment:    Type:   Image     Comment:   External Document

## 2010-08-27 NOTE — Assessment & Plan Note (Signed)
Summary: Follow up/cb   Vital Signs:  Patient profile:   70 year old female Weight:      180 pounds Temp:     97.9 degrees F oral Pulse rate:   68 / minute BP sitting:   126 / 74  (left arm) Cuff size:   large  Vitals Entered By: Alfred Levins, CMA (June 04, 2010 9:55 AM) CC: f/u, fasting   Primary Care Provider:  Birdie Sons, MD  CC:  f/u and fasting.  History of Present Illness:  Follow-Up Visit      This is a 70 year old woman who presents for Follow-up visit.  The patient denies chest pain and palpitations.  Since the last visit the patient notes no new problems or concerns except fro chronic pain of back. States she has collapsed vertebra resulting in chronic pain.  The patient reports taking meds as prescribed.  When questioned about possible medication side effects, the patient notes none.    All other systems reviewed and were negative except she has chronic SOB, related to COPD. Also has sensation of chronic sinus congestion---[place]. Also reports waxing and waning hearing.   Current Problems (verified): 1)  Barretts Esophagus  (ICD-530.85) 2)  Anemia, Iron Deficiency  (ICD-280.9) 3)  Vitamin B12 Deficiency  (ICD-266.2) 4)  Seizure Disorder  (ICD-780.39) 5)  Osteoporosis  (ICD-733.00) 6)  Hypertension  (ICD-401.9) 7)  Hyperlipidemia  (ICD-272.4) 8)  Gerd  (ICD-530.81) 9)  Depression  (ICD-311) 10)  COPD  (ICD-496)  Current Medications (verified): 1)  Lisinopril-Hydrochlorothiazide 20-25 Mg Tabs (Lisinopril-Hydrochlorothiazide) .Marland Kitchen.. 1 By Mouth Once Daily 2)  Omeprazole 20 Mg Cpdr (Omeprazole) .Marland Kitchen.. 1 By Mouth Once Daily 3)  Albuterol 90 Mcg/act Aers (Albuterol) .... Inhale 2 Puff As Directed Four Times A  Day As Needed 4)  Trazodone Hcl 100 Mg Tabs (Trazodone Hcl) .Marland Kitchen.. 1 By Mouth Once Daily 5)  Boniva 150 Mg Tabs (Ibandronate Sodium) .... Take 1 Tab By One Time Monthly 6)  Simvastatin 20 Mg Tabs (Simvastatin) .... One Tab Every  Other Day 7)  Ferrous Sulfate 325 (65 Fe) Mg Tbec (Ferrous Sulfate) .... Take 1 Tablet By Mouth Three Times A Day 8)  Cyanocobalamin 1000 Mcg/ml Soln (Cyanocobalamin) .... Inject 1 Cc Evefry Month At Dr Cato Mulligan' Office 9)  Fluticasone Propionate 50 Mcg/act  Susp (Fluticasone Propionate) .... 2 Sprays Each Nostril Once Daily 10)  Acetaminophen 500 Mg Caps (Acetaminophen) .... As Needed 11)  Lexapro 10 Mg Tabs (Escitalopram Oxalate) .Marland Kitchen.. 1 Once Daily 12)  Proair Hfa 108 (90 Base) Mcg/act  Aers (Albuterol Sulfate) .... 2 Inh Q4h As Needed Shortness of Breath  Allergies (verified): No Known Drug Allergies  Past History:  Past Medical History: Last updated: 06/04/2009 Allergic rhinitis COPD Depression GERD/barrett's esophagus Hyperlipidemia Hypertension Osteoporosis Seizure disorder ovarian cancer barrett's esophagus 07/19/2004 Anemia Obesity Pneumonia Stroke Sleep Apnea Other Hepatits  Past Surgical History: Last updated: 05/04/2006 hip fracture ORIF Cholecystectomy Hemorrhoidectomy Total hip replacement Hysterectomy Surgery for ? ovarian CA/cervical CA  Family History: Last updated: 07/25/2009 [place] Family History of Esophageal Cancer:Brother Family History of Stomach Cancer:Father Family History of Heart Disease: Mother Family History of Diabetes:Mother, Brother, Sister ,sons x2 No FH of Colon Cancer:  Social History: Last updated: 06/04/2009 Former Smoker Occupation: Retired Alcohol Use - no Daily Caffeine Use -6 Illicit Drug Use - no  Risk Factors: Smoking Status: quit > 6 months (01/31/2010)  Physical Exam  General:  elderly female in no  acute distress. HEENT exam atraumatic, normocephalic symmetric her muscles are intact. Tympanic membranes appear normal. External auditory canals are normal. Neck is supple without lymphadenopathy, thyromegaly. Chest clear to auscultation cardiac exam S1-S2 are regular. Extremities is no clubbing cyanosis or  edema.   Impression & Recommendations:  Problem # 1:  COPD (ICD-496) likely because of her shortness of breath. No wheezing currently. Continue current medications. May need PFTs. Her updated medication list for this problem includes:    Albuterol 90 Mcg/act Aers (Albuterol) ..... Inhale 2 puff as directed four times a  day as needed  Problem # 2:  ANEMIA, IRON DEFICIENCY (ICD-280.9)  check labs today Her updated medication list for this problem includes:    Ferrous Sulfate 325 (65 Fe) Mg Tbec (Ferrous sulfate) .Marland Kitchen... Take 1 tablet by mouth three times a day    Cyanocobalamin 1000 Mcg/ml Soln (Cyanocobalamin) ..... Inject 1 cc evefry month at dr Murriel Eidem' office  Hgb: 9.1 (01/31/2010)   Hct: 28.9 (01/31/2010)   Platelets: 355.0 (01/31/2010) RBC: 3.54 (01/31/2010)   RDW: 20.3 (01/31/2010)   WBC: 7.5 (01/31/2010) MCV: 81.8 (01/31/2010)   MCHC: 31.3 (01/31/2010) Ferritin: 5.9 (02/05/2010) Iron: 27 (02/05/2010)   % Sat: 6.1 (02/05/2010) B12: 739 (02/05/2010)   Folate: 6.7 (04/30/2009)   TSH: 3.55 (01/31/2010)  Orders: Venipuncture (95621) TLB-B12 + Folate Pnl (30865_78469-G29/BMW) TLB-CBC Platelet - w/Differential (85025-CBCD)  Problem # 3:  HYPERTENSION (ICD-401.9) controlled. Continue current medications. Her updated medication list for this problem includes:    Lisinopril-hydrochlorothiazide 20-25 Mg Tabs (Lisinopril-hydrochlorothiazide) .Marland Kitchen... 1 by mouth once daily  BP today: 126/74 Prior BP: 140/70 (04/24/2010)  Labs Reviewed: K+: 4.6 (01/31/2010) Creat: : 0.9 (01/31/2010)   Chol: 237 (01/31/2010)   HDL: 36.40 (01/31/2010)   LDL: DEL (10/09/2008)   TG: 387.0 (01/31/2010)  Orders: TLB-BMP (Basic Metabolic Panel-BMET) (80048-METABOL)  Problem # 4:  HYPERLIPIDEMIA (ICD-272.4)  Her updated medication list for this problem includes:    Simvastatin 20 Mg Tabs (Simvastatin) ..... One tab every other day  Labs Reviewed: SGOT: 19 (01/31/2010)   SGPT: 13 (01/31/2010)   HDL:36.40  (01/31/2010), 33.70 (04/11/2009)  LDL:DEL (10/09/2008), DEL (05/23/2008)  Chol:237 (01/31/2010), 204 (04/11/2009)  Trig:387.0 (01/31/2010), 236.0 (04/11/2009)  Orders: TLB-Lipid Panel (80061-LIPID) TLB-Hepatic/Liver Function Pnl (80076-HEPATIC)  Problem # 5:  HEARING LOSS, UNSPEC. (ICD-389.9)  Orders: Audiology (Audio)  Problem # 6:  SINUSITIS, CHRONIC (ICD-473.9)  The following medications were removed from the medication list:    Cipro 250 Mg Tabs (Ciprofloxacin hcl) ..... By mouth two times a day for 7 days Her updated medication list for this problem includes:    Fluticasone Propionate 50 Mcg/act Susp (Fluticasone propionate) .Marland Kitchen... 2 sprays each nostril once daily  Orders: Radiology Referral (Radiology)  Complete Medication List: 1)  Lisinopril-hydrochlorothiazide 20-25 Mg Tabs (Lisinopril-hydrochlorothiazide) .Marland Kitchen.. 1 by mouth once daily 2)  Omeprazole 20 Mg Cpdr (Omeprazole) .Marland Kitchen.. 1 by mouth once daily 3)  Albuterol 90 Mcg/act Aers (Albuterol) .... Inhale 2 puff as directed four times a  day as needed 4)  Trazodone Hcl 100 Mg Tabs (Trazodone hcl) .Marland Kitchen.. 1 by mouth once daily 5)  Boniva 150 Mg Tabs (Ibandronate sodium) .... Take 1 tab by one time monthly 6)  Simvastatin 20 Mg Tabs (Simvastatin) .... One tab every other day 7)  Ferrous Sulfate 325 (65 Fe) Mg Tbec (Ferrous sulfate) .... Take 1 tablet by mouth three times a day 8)  Cyanocobalamin 1000 Mcg/ml Soln (Cyanocobalamin) .... Inject 1 cc evefry month at dr Danyel Tobey' office 9)  Fluticasone  Propionate 50 Mcg/act Susp (Fluticasone propionate) .... 2 sprays each nostril once daily 10)  Acetaminophen 500 Mg Caps (Acetaminophen) .... As needed 11)  Lexapro 10 Mg Tabs (Escitalopram oxalate) .Marland Kitchen.. 1 once daily 12)  Proair Hfa 108 (90 Base) Mcg/act Aers (Albuterol sulfate) .... 2 inh q4h as needed shortness of breath  Patient Instructions: 1)  Please schedule a follow-up appointment in 1 month.   Orders Added: 1)  Radiology Referral  [Radiology] 2)  Audiology [Audio] 3)  Venipuncture [64403] 4)  TLB-B12 + Folate Pnl [82746_82607-B12/FOL] 5)  TLB-CBC Platelet - w/Differential [85025-CBCD] 6)  TLB-BMP (Basic Metabolic Panel-BMET) [80048-METABOL] 7)  TLB-Lipid Panel [80061-LIPID] 8)  TLB-Hepatic/Liver Function Pnl [80076-HEPATIC] 9)  Est. Patient Level IV [47425]  Appended Document: Orders Update    Clinical Lists Changes  Orders: Added new Service order of Specimen Handling (95638) - Signed      Appended Document: Orders Update    Clinical Lists Changes  Orders: Added new Service order of Vit B12 1000 mcg (V5643) - Signed Added new Service order of Admin of Therapeutic Inj  intramuscular or subcutaneous (32951) - Signed       Medication Administration  Injection # 1:    Medication: Vit B12 1000 mcg    Diagnosis: VITAMIN B12 DEFICIENCY (ICD-266.2)    Route: IM    Site: L deltoid    Exp Date: 7/13    Lot #: [id number]    Mfr: American Regent    Patient tolerated injection without complications    Given by: Alfred Levins, CMA (June 04, 2010 11:03 AM)  Orders Added: 1)  Vit B12 1000 mcg [J3420] 2)  Admin of Therapeutic Inj  intramuscular or subcutaneous [88416]

## 2010-08-27 NOTE — Consult Note (Signed)
Summary: [facility]  [facility]   Imported By: Maryln Gottron 06/14/2010 11:10:49  _____________________________________________________________________  External Attachment:    Type:   Image     Comment:   External Document

## 2010-08-27 NOTE — Assessment & Plan Note (Signed)
Summary: B12 INJ//CCM  Nurse Visit   Allergies: No Known Drug Allergies  Medication Administration  Injection # 1:    Medication: Vit B12 1000 mcg    Diagnosis: VITAMIN B12 DEFICIENCY (ICD-266.2)    Route: IM    Site: L deltoid    Exp Date: 03/29/2011    Lot #: [id number]    Mfr: [employer] Regent    Patient tolerated injection without complications    Given by: Gladis Riffle, RN (November 12, 2009 9:04 AM)  Orders Added: 1)  Vit B12 1000 mcg [J3420] 2)  Admin of Therapeutic Inj  intramuscular or subcutaneous [96372]   Medication Administration  Injection # 1:    Medication: Vit B12 1000 mcg    Diagnosis: VITAMIN B12 DEFICIENCY (ICD-266.2)    Route: IM    Site: L deltoid    Exp Date: 03/29/2011    Lot #: [id number]    Mfr: American Regent    Patient tolerated injection without complications    Given by: Gladis Riffle, RN (November 12, 2009 9:04 AM)  Orders Added: 1)  Vit B12 1000 mcg [J3420] 2)  Admin of Therapeutic Inj  intramuscular or subcutaneous [54098]

## 2010-08-27 NOTE — Miscellaneous (Signed)
Summary: BONE DENSITY  Clinical Lists Changes  Orders: Added new Test order of T-Bone Densitometry (77080) - Signed Added new Test order of T-Lumbar Vertebral Assessment (77082) - Signed 

## 2010-08-27 NOTE — Assessment & Plan Note (Signed)
Summary: POSSIBLE UTI//[facility]   Vital Signs:  Patient profile:   70 year old female Weight:      179 pounds Temp:     97.7 degrees F oral BP sitting:   140 / 70  (left arm) Cuff size:   regular  Vitals Entered By: Sid Falcon LPN (April 24, 2010 10:14 AM)  History of Present Illness: patient seen with 2 week history of some urine frequency and burning with urination. Small volume urination during the day and urine flow seems better at night. She denies any fever but has had some intermittent chills. Chronic low back pain unchanged. She reports history of some type of cancer (presumably ovarian) treated with pelvic radiation many years ago. No history of known urethral stricture.  Allergies (verified): No Known Drug Allergies  Past History:  Past Medical History: Last updated: 06/04/2009 Allergic rhinitis COPD Depression GERD/barrett's esophagus Hyperlipidemia Hypertension Osteoporosis Seizure disorder ovarian cancer barrett's esophagus 07/19/2004 Anemia Obesity Pneumonia Stroke Sleep Apnea Other Hepatits  Review of Systems  The patient denies anorexia, fever, weight loss, abdominal pain, and hematuria.    Physical Exam  General:  Well-developed,well-nourished,in no acute distress; alert,appropriate and cooperative throughout examination Lungs:  Normal respiratory effort, chest expands symmetrically. Lungs are clear to auscultation, no crackles or wheezes. Heart:  normal rate and regular rhythm.   Msk:  no CVA tenderness   Impression & Recommendations:  Problem # 1:  DYSURIA (ICD-788.1)  rule out UTI.  If urine culture negative consider urology referral. Question urethral stricture or other abnormality related to prior radiation Her updated medication list for this problem includes:    Cipro 250 Mg Tabs (Ciprofloxacin hcl) ..... By mouth two times a day for 7 days  Orders: UA Dipstick w/o Micro (manual) ([address]) T-Culture, Urine ([id number])  Complete  Medication List: 1)  Lisinopril-hydrochlorothiazide 20-25 Mg Tabs (Lisinopril-hydrochlorothiazide) .Marland Kitchen.. 1 by mouth once daily 2)  Omeprazole 20 Mg Cpdr (Omeprazole) .Marland Kitchen.. 1 by mouth once daily 3)  Albuterol 90 Mcg/act Aers (Albuterol) .... Inhale 2 puff as directed four times a  day as needed 4)  Trazodone Hcl 100 Mg Tabs (Trazodone hcl) .Marland Kitchen.. 1 by mouth once daily 5)  Boniva 150 Mg Tabs (Ibandronate sodium) .... Take 1 tab by one time monthly 6)  Simvastatin 20 Mg Tabs (Simvastatin) .... One tab every other day 7)  Ferrous Sulfate 325 (65 Fe) Mg Tbec (Ferrous sulfate) .... Take 1 tablet by mouth three times a day 8)  Cyanocobalamin 1000 Mcg/ml Soln (Cyanocobalamin) .... Inject 1 cc evefry month at dr swords' office 9)  Fluticasone Propionate 50 Mcg/act Susp (Fluticasone propionate) .... 2 sprays each nostril once daily 10)  Acetaminophen 500 Mg Caps (Acetaminophen) .... As needed 11)  Cipro 250 Mg Tabs (Ciprofloxacin hcl) .... By mouth two times a day for 7 days 12)  Lexapro 10 Mg Tabs (Escitalopram oxalate) .Marland Kitchen.. 1 once daily  Patient Instructions: 1)  Drink plenty of fluids up to 3-4 quarts a day. Cranberry juice is especially recommended in addition to large amounts of water. Avoid caffeine & carbonated drinks, they tend to irritate the bladder, Return in 3-5 days if you're not better: sooner if you're feeling worse.  Prescriptions: CIPRO 250 MG TABS (CIPROFLOXACIN HCL) by mouth two times a day for 7 days  #14 x 0   Entered and Authorized by:   Evelena Peat MD   Signed by:   Evelena Peat MD on 04/24/2010   Method used:   Electronically to  [facility]* (retail)       [address]       [address]       [place], [place]  [address]       Ph: [phone]       Fax: [phone]   RxID:   [id number]   Laboratory Results   Urine Tests    Routine Urinalysis   Color: lt. yellow Appearance: Hazy Glucose: negative   (Normal Range: Negative) Bilirubin:  negative   (Normal Range: Negative) Ketone: negative   (Normal Range: Negative) Spec. Gravity: <1.005   (Normal Range: 1.003-1.035) Blood: small   (Normal Range: Negative) pH: 5.0   (Normal Range: 5.0-8.0) Protein: trace   (Normal Range: Negative) Urobilinogen: 0.2   (Normal Range: 0-1) Nitrite: negative   (Normal Range: Negative) Leukocyte Esterace: .signmoderate   (Normal Range: Negative)

## 2010-08-27 NOTE — Progress Notes (Signed)
Summary: Returned call  Phone Note Call from Patient   Caller: Patient Summary of Call: Pt returning call she can be reached at [phone] Initial call taken by: Kathrynn Speed CMA,  December 27, 2009 9:57 AM  Follow-up for Phone Call        Pt told results of bone density.  Will continue boniva. Follow-up by: Gladis Riffle, RN,  December 27, 2009 11:02 AM

## 2010-08-27 NOTE — Assessment & Plan Note (Signed)
Summary: follow up/pt fasting/per Ellen/cjr   Vital Signs:  Patient profile:   70 year old female Weight:      176 pounds Temp:     98.4 degrees F oral Pulse rate:   80 / minute Pulse rhythm:   regular Resp:     14 per minute BP sitting:   100 / 58  (left arm) Cuff size:   regular  Vitals Entered By: Gladis Riffle, RN (January 31, 2010 8:30 AM) CC: FU , has had small amount coke only--c/o dribbling, burning, bladder pressure x 1 week and getting worse Is Patient Diabetic? No   Primary Care Provider:  Birdie Sons, MD  CC:  FU , has had small amount coke only--c/o dribbling, burning, and bladder pressure x 1 week and getting worse.  History of Present Illness:  Follow-Up Visit      This is a 70 year old woman who presents for Follow-up visit.  The patient denies chest pain and palpitations.  Since the last visit the patient notes no new problems or concerns---except has noted recurrent dyuria and urinary hesitancy.  The patient reports taking meds as prescribed.  When questioned about possible medication side effects, the patient notes none.    All other systems reviewed and were negative   Preventive Screening-Counseling & Management  Alcohol-Tobacco     Smoking Status: quit > 6 months     Year Started: 1958     Year Quit: 2004  Current Problems (verified): 1)  Uti  (ICD-599.0) 2)  Barretts Esophagus  (ICD-530.85) 3)  Anemia, Iron Deficiency  (ICD-280.9) 4)  Vitamin B12 Deficiency  (ICD-266.2) 5)  Seizure Disorder  (ICD-780.39) 6)  Osteoporosis  (ICD-733.00) 7)  Hypertension  (ICD-401.9) 8)  Hyperlipidemia  (ICD-272.4) 9)  Gerd  (ICD-530.81) 10)  Depression  (ICD-311) 11)  COPD  (ICD-496)  Current Medications (verified): 1)  Lisinopril-Hydrochlorothiazide 20-25 Mg Tabs (Lisinopril-Hydrochlorothiazide) .Marland Kitchen.. 1 By Mouth Once Daily 2)  Omeprazole 20 Mg Cpdr (Omeprazole) .Marland Kitchen.. 1 By Mouth Once Daily 3)  Albuterol 90 Mcg/act Aers (Albuterol) .... Inhale 2 Puff As Directed Four  Times A  Day As Needed 4)  Trazodone Hcl 100 Mg Tabs (Trazodone Hcl) .Marland Kitchen.. 1 By Mouth Once Daily 5)  Boniva 150 Mg Tabs (Ibandronate Sodium) .... Take 1 Tab By One Time Monthly 6)  Simvastatin 20 Mg Tabs (Simvastatin) .... One Tab Every Other Day 7)  Ferrous Sulfate 325 (65 Fe) Mg Tbec (Ferrous Sulfate) .... Take 1 Tablet By Mouth Three Times A Day 8)  Cyanocobalamin 1000 Mcg/ml Soln (Cyanocobalamin) .... Inject 1 Cc Evefry Month At Dr Cato Mulligan' Office 9)  Fluticasone Propionate 50 Mcg/act  Susp (Fluticasone Propionate) .... 2 Sprays Each Nostril Once Daily 10)  Acetaminophen 500 Mg Caps (Acetaminophen) .... As Needed  Allergies (verified): No Known Drug Allergies  Past History:  Past Medical History: Last updated: 06/04/2009 Allergic rhinitis COPD Depression GERD/barrett's esophagus Hyperlipidemia Hypertension Osteoporosis Seizure disorder ovarian cancer barrett's esophagus 07/19/2004 Anemia Obesity Pneumonia Stroke Sleep Apnea Other Hepatits  Past Surgical History: Last updated: 05/04/2006 hip fracture ORIF Cholecystectomy Hemorrhoidectomy Total hip replacement Hysterectomy Surgery for ? ovarian CA/cervical CA  Family History: Last updated: 07/25/2009 [place] Family History of Esophageal Cancer:Brother Family History of Stomach Cancer:Father Family History of Heart Disease: Mother Family History of Diabetes:Mother, Brother, Sister ,sons x2 No FH of Colon Cancer:  Social History: Last updated: 06/04/2009 Former Smoker Occupation: Retired Alcohol Use - no Daily Caffeine Use -6 Illicit Drug Use - no  Risk Factors: Smoking Status: quit > 6 months (01/31/2010)  Physical Exam  General:  alert and well-developed.   Head:  atraumatic and no abnormalities observed.   Eyes:  pupils equal and pupils round.   Ears:  R ear normal and L ear normal.   Neck:  No deformities, masses, or tenderness noted. Chest Wall:  No deformities, masses, or tenderness noted. Lungs:   normal respiratory effort and no accessory muscle use.   Heart:  normal rate and regular rhythm.   Abdomen:  Bowel sounds positive,abdomen soft and non-tender without masses, organomegaly or hernias noted. Msk:  No deformity or scoliosis noted of thoracic or lumbar spine.   Neurologic:  cranial nerves II-XII intact and gait normal.   Skin:  turgor normal and color normal.     Impression & Recommendations:  Problem # 1:  UTI (ICD-599.0)  check culture treat with antibiotic side effects discussed Orders: UA Dipstick w/o Micro (automated)  (81003) T-Culture, Urine ([id number])  Her updated medication list for this problem includes:    Cephalexin 500 Mg Tabs (Cephalexin) .Marland Kitchen... Take one by mouth 3 times daily  Problem # 2:  OSTEOPOROSIS (ICD-733.00) continue current medications  Her updated medication list for this problem includes:    Boniva 150 Mg Tabs (Ibandronate sodium) .Marland Kitchen... Take 1 tab by one time monthly  Problem # 3:  ANEMIA, IRON DEFICIENCY (ICD-280.9)  Her updated medication list for this problem includes:    Ferrous Sulfate 325 (65 Fe) Mg Tbec (Ferrous sulfate) .Marland Kitchen... Take 1 tablet by mouth three times a day    Cyanocobalamin 1000 Mcg/ml Soln (Cyanocobalamin) ..... Inject 1 cc evefry month at dr swords' office  Hgb: 10.5 (07/25/2009)   Hct: 32.6 (07/25/2009)   Platelets: 412.0 (07/25/2009) RBC: 3.81 (07/25/2009)   RDW: 16.5 (07/25/2009)   WBC: 10.9 (07/25/2009) MCV: 85.6 (07/25/2009)   MCHC: 32.1 (07/25/2009) Ferritin: 6.4 (04/30/2009) Iron: 18 (04/30/2009)   % Sat: 3.9 (04/30/2009) B12: 159 (04/30/2009)   Folate: 6.7 (04/30/2009)   TSH: 1.84 (10/09/2008)  Orders: TLB-CBC Platelet - w/Differential (85025-CBCD)  Problem # 4:  GERD (ICD-530.81) controlled continue current medications  Her updated medication list for this problem includes:    Omeprazole 20 Mg Cpdr (Omeprazole) .Marland Kitchen... 1 by mouth once daily  Problem # 5:  COPD (ICD-496) no meds needed at this  time Her updated medication list for this problem includes:    Albuterol 90 Mcg/act Aers (Albuterol) ..... Inhale 2 puff as directed four times a  day as needed  Complete Medication List: 1)  Lisinopril-hydrochlorothiazide 20-25 Mg Tabs (Lisinopril-hydrochlorothiazide) .Marland Kitchen.. 1 by mouth once daily 2)  Omeprazole 20 Mg Cpdr (Omeprazole) .Marland Kitchen.. 1 by mouth once daily 3)  Albuterol 90 Mcg/act Aers (Albuterol) .... Inhale 2 puff as directed four times a  day as needed 4)  Trazodone Hcl 100 Mg Tabs (Trazodone hcl) .Marland Kitchen.. 1 by mouth once daily 5)  Boniva 150 Mg Tabs (Ibandronate sodium) .... Take 1 tab by one time monthly 6)  Simvastatin 20 Mg Tabs (Simvastatin) .... One tab every other day 7)  Ferrous Sulfate 325 (65 Fe) Mg Tbec (Ferrous sulfate) .... Take 1 tablet by mouth three times a day 8)  Cyanocobalamin 1000 Mcg/ml Soln (Cyanocobalamin) .... Inject 1 cc evefry month at dr swords' office 9)  Fluticasone Propionate 50 Mcg/act Susp (Fluticasone propionate) .... 2 sprays each nostril once daily 10)  Acetaminophen 500 Mg Caps (Acetaminophen) .... As needed 11)  Cephalexin 500 Mg Tabs (Cephalexin) .... Take one by  mouth 3 times daily  Other Orders: Venipuncture ([address]109) TLB-BMP (Basic Metabolic Panel-BMET) (80048-METABOL) TLB-Lipid Panel (80061-LIPID) TLB-Hepatic/Liver Function Pnl (80076-HEPATIC) TLB-TSH (Thyroid Stimulating Hormone) (60454-UJW)  Patient Instructions: 1)  Please schedule a follow-up appointment in 4 months. Prescriptions: CEPHALEXIN 500 MG  TABS (CEPHALEXIN) take one by mouth 3 times daily  #21 x 0   Entered and Authorized by:   Birdie Sons MD   Signed by:   Birdie Sons MD on 01/31/2010   Method used:   Electronically to        [facility]  [address] # [facility]* (retail)       [address]       [place], [place]  [address]       Ph: [phone] or [phone]       Fax: [phone]   RxID:   [id number]   Laboratory Results   Urine  Tests    Routine Urinalysis   Color: yellow Appearance: Clear Glucose: negative   (Normal Range: Negative) Bilirubin: negative   (Normal Range: Negative) Ketone: negative   (Normal Range: Negative) Spec. Gravity: >=1.030   (Normal Range: 1.003-1.035) Blood: trace-lysed   (Normal Range: Negative) pH: 5.0   (Normal Range: 5.0-8.0) Protein: 1+   (Normal Range: Negative) Urobilinogen: 0.2   (Normal Range: 0-1) Nitrite: negative   (Normal Range: Negative) Leukocyte Esterace: 1+   (Normal Range: Negative)    Comments: Rita Ohara  January 31, 2010 8:48 AM     Appended Document: follow up/pt fasting/per Ellen/cjr   Medication Administration  Injection # 1:    Medication: Vit B12 1000 mcg    Diagnosis: VITAMIN B12 DEFICIENCY (ICD-266.2)    Route: IM    Site: L deltoid    Exp Date: 03/29/2011    Lot #: [id number]    Mfr: [employer] Regent    Patient tolerated injection without complications    Given by: Gladis Riffle, RN (January 31, 2010 10:43 AM)  Orders Added: 1)  Vit B12 1000 mcg [J3420] 2)  Admin of Therapeutic Inj  intramuscular or subcutaneous [56387]

## 2010-08-27 NOTE — Assessment & Plan Note (Signed)
Summary: 3 MNTH ROV//SLM.Marland KitchenPT R/S..SLM   Vital Signs:  Patient profile:   70 year old female Weight:      177 pounds BMI:     31.47 O2 Sat:      84 % on Room air Temp:     97.7 degrees F Pulse rate:   68 / minute Resp:     16 per minute BP sitting:   126 / 74  (left arm)  Vitals Entered By: Gladis Riffle, RN (September 11, 2009 10:10 AM)  Nutrition Counseling: Patient's BMI is greater than 25 and therefore counseled on weight management options.  O2 Flow:  Room air CC: 3 month rov-multiple medical problems Is Patient Diabetic? No Comments will restart B12 injections   Primary Care Provider:  Birdie Sons, MD  CC:  3 month rov-multiple medical problems.  History of Present Illness:  Follow-Up Visit      This is a 70 year old woman who presents for Follow-up visit.  The patient denies chest pain and palpitations.  Since the last visit the patient notes no new problems or concerns.  The patient reports taking meds as prescribed.  When questioned about possible medication side effects, the patient notes none.    All other systems reviewed and were negative . she has chronic SOB---unchanged she has noted some nasal congestion  Preventive Screening-Counseling & Management  Alcohol-Tobacco     Smoking Status: quit > 6 months     Year Started: 1958     Year Quit: 2004  Current Problems (verified): 1)  Barretts Esophagus  (ICD-530.85) 2)  Anemia, Iron Deficiency  (ICD-280.9) 3)  Back Pain  (ICD-724.5) 4)  Vitamin B12 Deficiency  (ICD-266.2) 5)  Seizure Disorder  (ICD-780.39) 6)  Osteoporosis  (ICD-733.00) 7)  Hypertension  (ICD-401.9) 8)  Hyperlipidemia  (ICD-272.4) 9)  Gerd  (ICD-530.81) 10)  Depression  (ICD-311) 11)  COPD  (ICD-496)  Medications Prior to Update: 1)  Lexapro 10 Mg Tabs (Escitalopram Oxalate) .... Take 1 Tablet By Mouth Once A Day 2)  Lisinopril-Hydrochlorothiazide 20-25 Mg Tabs (Lisinopril-Hydrochlorothiazide) .Marland Kitchen.. 1 By Mouth Once Daily 3)  Omeprazole  20 Mg Cpdr (Omeprazole) .Marland Kitchen.. 1 By Mouth Once Daily 4)  Albuterol 90 Mcg/act Aers (Albuterol) .... Inhale 2 Puff As Directed Four Times A  Day As Needed 5)  Trazodone Hcl 100 Mg Tabs (Trazodone Hcl) .Marland Kitchen.. 1 By Mouth Once Daily 6)  Boniva 150 Mg Tabs (Ibandronate Sodium) .... Take 1 Tab By One Time Monthly 7)  Simvastatin 20 Mg Tabs (Simvastatin) .... One Tab Every Other Day 8)  Fluticasone Propionate 50 Mcg/act  Susp (Fluticasone Propionate) .... 2 Sprays Each Nostril Once Daily 9)  Ferrous Sulfate 325 (65 Fe) Mg Tbec (Ferrous Sulfate) .... Take 1 Tablet By Mouth Three Times A Day 10)  Cyanocobalamin 1000 Mcg/ml Soln (Cyanocobalamin) .... Inject 1 Cc Evefry Month At Dr Cato Mulligan' Office  Allergies (verified): No Known Drug Allergies  Past History:  Past Medical History: Last updated: 06/04/2009 Allergic rhinitis COPD Depression GERD/barrett's esophagus Hyperlipidemia Hypertension Osteoporosis Seizure disorder ovarian cancer barrett's esophagus 07/19/2004 Anemia Obesity Pneumonia Stroke Sleep Apnea Other Hepatits  Past Surgical History: Last updated: 05/04/2006 hip fracture ORIF Cholecystectomy Hemorrhoidectomy Total hip replacement Hysterectomy Surgery for ? ovarian CA/cervical CA  Family History: Last updated: 07/25/2009 [place] Family History of Esophageal Cancer:Brother Family History of Stomach Cancer:Father Family History of Heart Disease: Mother Family History of Diabetes:Mother, Brother, Sister ,sons x2 No FH of Colon Cancer:  Social History: Last updated: 06/04/2009  Former Smoker Occupation: Retired Alcohol Use - no Daily Caffeine Use -6 Illicit Drug Use - no  Risk Factors: Smoking Status: quit > 6 months (09/11/2009)  Physical Exam  General:  Well-developed,well-nourished,in no acute distress; alert,appropriate and cooperative throughout examination Head:  normocephalic and atraumatic.   Eyes:  pupils equal and pupils round.   Ears:  R ear normal and  L ear normal.   Neck:  No deformities, masses, or tenderness noted. Chest Wall:  No deformities, masses, or tenderness noted. Lungs:  Normal respiratory effort, chest expands symmetrically. Lungs are clear to auscultation, no crackles or wheezes. Heart:  normal rate and regular rhythm.   Abdomen:  Bowel sounds positive,abdomen soft and non-tender without masses, organomegaly or hernias noted. Msk:  No deformity or scoliosis noted of thoracic or lumbar spine.   Pulses:  R radial normal and L radial normal.   Neurologic:  cranial nerves II-XII intact and gait normal.   Skin:  turgor normal and color normal.   Cervical Nodes:  no anterior cervical adenopathy and no posterior cervical adenopathy.   Psych:  normally interactive and good eye contact.     Impression & Recommendations:  Problem # 1:  BARRETTS ESOPHAGUS (ICD-530.85)  no sxs  Orders: Prescription Created Electronically [phone])  Problem # 2:  ANEMIA, IRON DEFICIENCY (ICD-280.9) continue current medications  Her updated medication list for this problem includes:    Ferrous Sulfate 325 (65 Fe) Mg Tbec (Ferrous sulfate) .Marland Kitchen... Take 1 tablet by mouth three times a day    Cyanocobalamin 1000 Mcg/ml Soln (Cyanocobalamin) ..... Inject 1 cc evefry month at dr swords' office  Problem # 3:  HYPERTENSION (ICD-401.9) well controlled continue current medications  Her updated medication list for this problem includes:    Lisinopril-hydrochlorothiazide 20-25 Mg Tabs (Lisinopril-hydrochlorothiazide) .Marland Kitchen... 1 by mouth once daily  BP today: 126/74 Prior BP: 128/72 (07/25/2009)  Labs Reviewed: K+: 4.1 (04/11/2009) Creat: : 1.0 (04/11/2009)   Chol: 204 (04/11/2009)   HDL: 33.70 (04/11/2009)   LDL: DEL (10/09/2008)   TG: 236.0 (04/11/2009)  Problem # 4:  OSTEOPOROSIS (ICD-733.00) continue current medications  Her updated medication list for this problem includes:    Boniva 150 Mg Tabs (Ibandronate sodium) .Marland Kitchen... Take 1 tab by one time  monthly  Problem # 5:  GERD (ICD-530.81) no sxs continue current medications  Her updated medication list for this problem includes:    Omeprazole 20 Mg Cpdr (Omeprazole) .Marland Kitchen... 1 by mouth once daily  EGD: DONE (06/05/2009)  Labs Reviewed: Hgb: 10.5 (07/25/2009)   Hct: 32.6 (07/25/2009)  Problem # 6:  COPD (ICD-496)  Her updated medication list for this problem includes:    Albuterol 90 Mcg/act Aers (Albuterol) ..... Inhale 2 puff as directed four times a  day as needed  Complete Medication List: 1)  Lexapro 10 Mg Tabs (Escitalopram oxalate) .... Take 1 tablet by mouth once a day 2)  Lisinopril-hydrochlorothiazide 20-25 Mg Tabs (Lisinopril-hydrochlorothiazide) .Marland Kitchen.. 1 by mouth once daily 3)  Omeprazole 20 Mg Cpdr (Omeprazole) .Marland Kitchen.. 1 by mouth once daily 4)  Albuterol 90 Mcg/act Aers (Albuterol) .... Inhale 2 puff as directed four times a  day as needed 5)  Trazodone Hcl 100 Mg Tabs (Trazodone hcl) .Marland Kitchen.. 1 by mouth once daily 6)  Boniva 150 Mg Tabs (Ibandronate sodium) .... Take 1 tab by one time monthly 7)  Simvastatin 20 Mg Tabs (Simvastatin) .... One tab every other day 8)  Ferrous Sulfate 325 (65 Fe) Mg Tbec (Ferrous sulfate) .... Take  1 tablet by mouth three times a day 9)  Cyanocobalamin 1000 Mcg/ml Soln (Cyanocobalamin) .... Inject 1 cc evefry month at dr swords' office 10)  Fluticasone Propionate 50 Mcg/act Susp (Fluticasone propionate) .... 2 sprays each nostril once daily  Other Orders: Vit B12 1000 mcg (J3420) Admin of Therapeutic Inj  intramuscular or subcutaneous (04540)  Patient Instructions: 1)  Please schedule a follow-up appointment in 4 months. 2)  lipids 272.4 3)  liver 995.2 4)  bmet--995.2 5)  cbcdiff---285.9 6)  esr---285.9 Prescriptions: FLUTICASONE PROPIONATE 50 MCG/ACT  SUSP (FLUTICASONE PROPIONATE) 2 sprays each nostril once daily  #1 vial x 3   Entered and Authorized by:   Birdie Sons MD   Signed by:   Birdie Sons MD on 09/11/2009   Method used:    Electronically to        [facility]  [address] # [facility]* (retail)       [address]       [place], [place]  [address]       Ph: [phone] or [phone]       Fax: [phone]   RxID:   [id number] TRAZODONE HCL 100 MG TABS (TRAZODONE HCL) 1 by mouth once daily  #90 x 3   Entered and Authorized by:   Birdie Sons MD   Signed by:   Birdie Sons MD on 09/11/2009   Method used:   Electronically to        [facility]  [address] # [facility]* (retail)       [address]       [place], [place]  [address]       Ph: [phone] or [phone]       Fax: [phone]   RxID:   [id number]    Medication Administration  Injection # 1:    Medication: Vit B12 1000 mcg    Diagnosis: VITAMIN B12 DEFICIENCY (ICD-266.2)    Route: IM    Site: L deltoid    Exp Date: 06/27/2010    Lot #: [id number]    Mfr: [employer] Regent    Patient tolerated injection without complications    Given by: Gladis Riffle, RN (September 11, 2009 10:19 AM)  Orders Added: 1)  Vit B12 1000 mcg [J3420] 2)  Admin of Therapeutic Inj  intramuscular or subcutaneous [96372] 3)  Prescription Created Electronically [G8553] 4)  Est. Patient Level IV [06237]   Prevention & Chronic Care Immunizations   Influenza vaccine: Fluvax 3+  (06/12/2009)    Tetanus booster: Not documented    Pneumococcal vaccine: Not documented    H. zoster vaccine: Not documented  Colorectal Screening   Hemoccult: Not documented    Colonoscopy: DONE  (06/20/2009)  Other Screening   Pap smear: Not documented    Mammogram: Not documented    DXA bone density scan: Not documented   Smoking status: quit > 6 months  (09/11/2009)  Lipids   Total Cholesterol: 204  (04/11/2009)   LDL: DEL  (10/09/2008)   LDL Direct: 127.2  (04/11/2009)   HDL: 33.70  (04/11/2009)   Triglycerides: 236.0  (04/11/2009)    SGOT (AST): 16  (10/09/2008)   SGPT (ALT): 10  (10/09/2008)   Alkaline phosphatase: 70   (10/09/2008)   Total bilirubin: 0.6  (10/09/2008)  Hypertension   Last Blood Pressure: 126 / 74  (09/11/2009)   Serum creatinine: 1.0  (04/11/2009)   Serum potassium 4.1  (  04/11/2009)  Self-Management Support :    Hypertension self-management support: Not documented    Lipid self-management support: Not documented

## 2010-08-27 NOTE — Progress Notes (Signed)
  Phone Note Call from Patient   Caller: Lab Call For: Birdie Sons MD Summary of Call: Urine culture correction......Marland Kitchenoriginal prelim was 30, 000 col gram Neg rods, and correct reading is 30, 000 colonies Multi-Species. Initial call taken by: Lynann Beaver CMA,  April 08, 2010 8:08 AM

## 2010-08-27 NOTE — Assessment & Plan Note (Signed)
Summary: B-12 INJ//ALP appt 130pm/njr  Nurse Visit   Allergies: No Known Drug Allergies  Medication Administration  Injection # 1:    Medication: Vit B12 1000 mcg    Diagnosis: VITAMIN B12 DEFICIENCY (ICD-266.2)    Route: IM    Site: L deltoid    Exp Date: 03/29/2011    Lot #: [id number]    Mfr: [employer] Regent    Patient tolerated injection without complications    Given by: Gladis Riffle, RN (Dec 12, 2009 11:58 AM)  Orders Added: 1)  Vit B12 1000 mcg [J3420] 2)  Admin of Therapeutic Inj  intramuscular or subcutaneous [96372]   Medication Administration  Injection # 1:    Medication: Vit B12 1000 mcg    Diagnosis: VITAMIN B12 DEFICIENCY (ICD-266.2)    Route: IM    Site: L deltoid    Exp Date: 03/29/2011    Lot #: [id number]    Mfr: American Regent    Patient tolerated injection without complications    Given by: Gladis Riffle, RN (Dec 12, 2009 11:58 AM)  Orders Added: 1)  Vit B12 1000 mcg [J3420] 2)  Admin of Therapeutic Inj  intramuscular or subcutaneous [54098]

## 2010-08-27 NOTE — Progress Notes (Signed)
Summary: REQUEST FOR LETTER  Phone Note Call from Patient   Caller: Patient Call For: Birdie Sons MD  /  Alvino Chapel, RN Reason for Call: Talk to Nurse, Talk to Doctor Summary of Call: Pt called to adv that she needs a letter (on [facility] letterhead) that provides the medical necessity /  documentation required for pt to be eligible for [employer] ([employer]).... Pt can be reached at [phone].  Initial call taken by: Debbra Riding,  March 15, 2010 12:01 PM  Follow-up for Phone Call        i will need to know what the requirements are. Follow-up by: Birdie Sons MD,  March 17, 2010 6:50 AM  Additional Follow-up for Phone Call Additional follow up Details #1::        unable to reach pt and no answering machine.  Will try again another time.Gladis Riffle, RN  March 18, 2010 2:40 PM  See next note for what required. Additional Follow-up by: Gladis Riffle, RN,  March 20, 2010 2:33 PM    Additional Follow-up for Phone Call Additional follow up Details #2::    Needs describe in detail her disability and why annot use city bus and if permanent. 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  She does use a cane at times, uses oxygen as needed, and does not drive, but needs transport to doctors' visits. Follow-up by: Gladis Riffle, RN,  March 20, 2010 2:36 PM  Additional Follow-up for Phone Call Additional follow up Details #3:: Details for Additional Follow-up Action Taken: ok to include in letter Additional Follow-up by: Birdie Sons MD,  March 20, 2010 4:34 PM   see letter.  Will be done on letterhead and pt notified when ready.

## 2010-08-27 NOTE — Assessment & Plan Note (Signed)
Summary: B12 INJ // RS  Nurse Visit  CC: Vitamin B-12 inj and pt c/o UTI./kb   Allergies: No Known Drug Allergies  Medication Administration  Injection # 1:    Medication: Vit B12 1000 mcg    Diagnosis: VITAMIN B12 DEFICIENCY (ICD-266.2)    Route: IM    Site: R deltoid    Exp Date: 01/26/2012    Lot #: [id number]    Mfr: American Regent    Patient tolerated injection without complications    Given by: Lucious Groves CMA (April 04, 2010 1:27 PM)  Orders Added: 1)  TLB-Udip w/ Micro [81001-URINE] 2)  T-Urine Culture (Spectrum Order) [[id number]] 3)  Vit B12 1000 mcg [J3420] 4)  Admin of Therapeutic Inj  intramuscular or subcutaneous [96372]  Appended Document: Orders Update    Clinical Lists Changes  Orders: Added new Service order of Specimen Handling (09811) - Signed Observations: Added new observation of COMMENTS: Wynona Canes, CMA  April 04, 2010 2:46 PM  (04/04/2010 13:29) Added new observation of PH URINE: 5.5  (04/04/2010 13:29) Added new observation of SPEC GR URIN: 1.025  (04/04/2010 13:29) Added new observation of APPEARANCE U: Clear  (04/04/2010 13:29) Added new observation of UA COLOR: yellow  (04/04/2010 13:29) Added new observation of WBC DIPSTK U: negative  (04/04/2010 13:29) Added new observation of NITRITE URN: negative  (04/04/2010 13:29) Added new observation of UROBILINOGEN: 0.2  (04/04/2010 13:29) Added new observation of PROTEIN, URN: negative  (04/04/2010 13:29) Added new observation of BLOOD UR DIP: negative  (04/04/2010 13:29) Added new observation of KETONES URN: negative  (04/04/2010 13:29) Added new observation of BILIRUBIN UR: negative  (04/04/2010 13:29) Added new observation of GLUCOSE, URN: negative  (04/04/2010 13:29)      Laboratory Results   Urine Tests  Date/Time Recieved: April 04, 2010 2:46 PM  Date/Time Reported: April 04, 2010 2:46 PM   Routine Urinalysis   Color: yellow Appearance: Clear Glucose:  negative   (Normal Range: Negative) Bilirubin: negative   (Normal Range: Negative) Ketone: negative   (Normal Range: Negative) Spec. Gravity: 1.025   (Normal Range: 1.003-1.035) Blood: negative   (Normal Range: Negative) pH: 5.5   (Normal Range: 5.0-8.0) Protein: negative   (Normal Range: Negative) Urobilinogen: 0.2   (Normal Range: 0-1) Nitrite: negative   (Normal Range: Negative) Leukocyte Esterace: negative   (Normal Range: Negative)    Comments: Wynona Canes, CMA  April 04, 2010 2:46 PM

## 2010-08-27 NOTE — Assessment & Plan Note (Signed)
Summary: b12 inj//ccm  Nurse Visit   Allergies: No Known Drug Allergies  Medication Administration  Injection # 1:    Medication: Vit B12 1000 mcg    Diagnosis: VITAMIN B12 DEFICIENCY (ICD-266.2)    Route: IM    Site: L deltoid    Exp Date: 03/29/2011    Lot #: [id number]    Mfr: American Regent    Patient tolerated injection without complications    Given by: Kern Reap CMA Duncan Dull) (October 15, 2009 2:09 PM)  Orders Added: 1)  Vit B12 1000 mcg [J3420] 2)  Admin of Therapeutic Inj  intramuscular or subcutaneous [[id number]]

## 2010-08-27 NOTE — Letter (Signed)
Summary: Generic Letter  [place] [facility]  [address] [place], [place] [address]   Phone: [phone]  Fax: [phone]    03/25/2010  Pamela Hampton [address] [place], [place]  [address]  To Whom It May Concern,  The above patient is in need of the [place] transportation as she is unable to walk to the city bus line.  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  She uses a cane at times and does not drive.  All her conditions are permanent.           Sincerely,     Sharaya Boruff H. Jaevian Shean, MD

## 2010-08-29 NOTE — Assessment & Plan Note (Signed)
Summary: b12 inj/njr  Nurse Visit   Allergies: No Known Drug Allergies  Medication Administration  Injection # 1:    Medication: Vit B12 1000 mcg    Diagnosis: ANEMIA, IRON DEFICIENCY (ICD-280.9)    Route: IM    Site: L deltoid    Exp Date: 7/13    Lot #: [id number]    Mfr: American Regent    Patient tolerated injection without complications    Given by: Alfred Levins, CMA (August 05, 2010 8:35 AM)  Orders Added: 1)  Vit B12 1000 mcg [J3420] 2)  Admin of Therapeutic Inj  intramuscular or subcutaneous [[address]]

## 2010-08-29 NOTE — Assessment & Plan Note (Signed)
Summary: 1 MONTH ROV/NJR ALSO B12 INJ/NJR   Vital Signs:  Patient profile:   70 year old female Height:      63 inches Weight:      180 pounds Temp:     97.9 degrees F oral Pulse rate:   72 / minute BP sitting:   130 / 72  (left arm)  Vitals Entered By: Jeremy Johann CMA (July 03, 2010 11:52 AM) CC: 1 month F/U, B12 injection   Primary Care Provider:  Birdie Sons, MD  CC:  1 month F/U and B12 injection.  History of Present Illness: She feels well she has started walking sinus congestion improved she has seen ENT---considering hearing aids  review of systems: No chest pain, shortness breath on PND or any other significant complaints.  Current Medications (verified): 1)  Lisinopril-Hydrochlorothiazide 20-25 Mg Tabs (Lisinopril-Hydrochlorothiazide) .Marland Kitchen.. 1 By Mouth Once Daily 2)  Omeprazole 20 Mg Cpdr (Omeprazole) .Marland Kitchen.. 1 By Mouth Once Daily 3)  Albuterol 90 Mcg/act Aers (Albuterol) .... Inhale 2 Puff As Directed Four Times A  Day As Needed 4)  Trazodone Hcl 100 Mg Tabs (Trazodone Hcl) .Marland Kitchen.. 1 By Mouth Once Daily 5)  Boniva 150 Mg Tabs (Ibandronate Sodium) .... Take 1 Tab By One Time Monthly 6)  Simvastatin 20 Mg Tabs (Simvastatin) .... One Tab Every Other Day 7)  Ferrous Sulfate 325 (65 Fe) Mg Tbec (Ferrous Sulfate) .... Take 1 Tablet By Mouth Three Times A Day 8)  Cyanocobalamin 1000 Mcg/ml Soln (Cyanocobalamin) .... Inject 1 Cc Evefry Month At Dr Cato Mulligan' Office 9)  Fluticasone Propionate 50 Mcg/act  Susp (Fluticasone Propionate) .... 2 Sprays Each Nostril Once Daily 10)  Acetaminophen 500 Mg Caps (Acetaminophen) .... As Needed 11)  Lexapro 10 Mg Tabs (Escitalopram Oxalate) .Marland Kitchen.. 1 Once Daily 12)  Proair Hfa 108 (90 Base) Mcg/act  Aers (Albuterol Sulfate) .... 2 Inh Q4h As Needed Shortness of Breath  Allergies (verified): No Known Drug Allergies  Past History:  Past Medical History: Last updated: 06/04/2009 Allergic rhinitis COPD Depression GERD/barrett's  esophagus Hyperlipidemia Hypertension Osteoporosis Seizure disorder ovarian cancer barrett's esophagus 07/19/2004 Anemia Obesity Pneumonia Stroke Sleep Apnea Other Hepatits  Past Surgical History: Last updated: 05/04/2006 hip fracture ORIF Cholecystectomy Hemorrhoidectomy Total hip replacement Hysterectomy Surgery for ? ovarian CA/cervical CA  Family History: Last updated: 07/25/2009 [place] Family History of Esophageal Cancer:Brother Family History of Stomach Cancer:Father Family History of Heart Disease: Mother Family History of Diabetes:Mother, Brother, Sister ,sons x2 No FH of Colon Cancer:  Social History: Last updated: 06/04/2009 Former Smoker Occupation: Retired Alcohol Use - no Daily Caffeine Use -6 Illicit Drug Use - no  Risk Factors: Smoking Status: quit > 6 months (01/31/2010)  Physical Exam  General:  overweight female in no acute distress. HEENT exam atraumatic, normocephalic symmetric her muscles are intact. Neck is supple without lymphadenopathy or jugular venous distention. Chest clear to auscultation abdominal exam active bowel sounds, soft, overweight. Extremities no edema.   Impression & Recommendations:  Problem # 1:  HYPERTENSION (ICD-401.9) reasonable control. Continue current medications. Her updated medication list for this problem includes:    Lisinopril-hydrochlorothiazide 20-25 Mg Tabs (Lisinopril-hydrochlorothiazide) .Marland Kitchen... 1 by mouth once daily  BP today: 130/72 Prior BP: 126/74 (06/04/2010)  Labs Reviewed: K+: 4.8 (06/04/2010) Creat: : 0.8 (06/04/2010)   Chol: 222 (06/04/2010)   HDL: 36.40 (06/04/2010)   LDL: DEL (10/09/2008)   TG: 320.0 (06/04/2010)  Problem # 2:  COPD (ICD-496) doing reasonably well The following medications were removed from the medication  list:    Albuterol 90 Mcg/act Aers (Albuterol) ..... Inhale 2 puff as directed four times a  day as needed Her updated medication list for this problem includes:     Proair Hfa 108 (90 Base) Mcg/act Aers (Albuterol sulfate) .Marland Kitchen... 2 inh q4h as needed shortness of breath  Complete Medication List: 1)  Lisinopril-hydrochlorothiazide 20-25 Mg Tabs (Lisinopril-hydrochlorothiazide) .Marland Kitchen.. 1 by mouth once daily 2)  Omeprazole 20 Mg Cpdr (Omeprazole) .Marland Kitchen.. 1 by mouth once daily 3)  Trazodone Hcl 100 Mg Tabs (Trazodone hcl) .Marland Kitchen.. 1 by mouth once daily 4)  Boniva 150 Mg Tabs (Ibandronate sodium) .... Take 1 tab by one time monthly 5)  Simvastatin 20 Mg Tabs (Simvastatin) .... One tab every other day 6)  Ferrous Sulfate 325 (65 Fe) Mg Tbec (Ferrous sulfate) .... Take 1 tablet by mouth three times a day 7)  Cyanocobalamin 1000 Mcg/ml Soln (Cyanocobalamin) .... Inject 1 cc evefry month at dr swords' office 8)  Fluticasone Propionate 50 Mcg/act Susp (Fluticasone propionate) .... 2 sprays each nostril once daily 9)  Acetaminophen 500 Mg Caps (Acetaminophen) .... As needed 10)  Lexapro 10 Mg Tabs (Escitalopram oxalate) .Marland Kitchen.. 1 once daily 11)  Proair Hfa 108 (90 Base) Mcg/act Aers (Albuterol sulfate) .... 2 inh q4h as needed shortness of breath  Other Orders: Admin of Therapeutic Inj  intramuscular or subcutaneous ([address]) Vit B12 1000 mcg (U0454)  Patient Instructions: 1)  Please schedule a follow-up appointment in 2 months. 2)  lipids 272.4 3)  liver 995.2   Medication Administration  Injection # 1:    Medication: Vit B12 1000 mcg    Diagnosis: VITAMIN B12 DEFICIENCY (ICD-266.2)    Route: IM    Site: R deltoid    Exp Date: 01/26/2012    Lot #: [id number]    Mfr: American Regent    Patient tolerated injection without complications    Given by: Jeremy Johann CMA (July 03, 2010 12:05 PM)  Orders Added: 1)  Admin of Therapeutic Inj  intramuscular or subcutaneous [96372] 2)  Vit B12 1000 mcg [J3420] 3)  Est. Patient Level III [09811]

## 2010-09-04 ENCOUNTER — Other Ambulatory Visit (INDEPENDENT_AMBULATORY_CARE_PROVIDER_SITE_OTHER): Payer: Medicare Other | Admitting: Internal Medicine

## 2010-09-04 DIAGNOSIS — E538 Deficiency of other specified B group vitamins: Secondary | ICD-10-CM

## 2010-09-04 DIAGNOSIS — E785 Hyperlipidemia, unspecified: Secondary | ICD-10-CM

## 2010-09-04 DIAGNOSIS — T887XXA Unspecified adverse effect of drug or medicament, initial encounter: Secondary | ICD-10-CM

## 2010-09-04 LAB — LDL CHOLESTEROL, DIRECT: Direct LDL: 170.6 mg/dL

## 2010-09-04 LAB — HEPATIC FUNCTION PANEL
ALT: 11 U/L (ref 0–35)
Albumin: 3.7 g/dL (ref 3.5–5.2)
Bilirubin, Direct: 0.1 mg/dL (ref 0.0–0.3)
Total Protein: 7.3 g/dL (ref 6.0–8.3)

## 2010-09-04 LAB — LIPID PANEL
Cholesterol: 282 mg/dL — ABNORMAL HIGH (ref 0–200)
HDL: 36.3 mg/dL — ABNORMAL LOW (ref >39.00)
Triglycerides: 355 mg/dL — ABNORMAL HIGH (ref 0.0–149.0)

## 2010-09-04 MED ORDER — CYANOCOBALAMIN 1000 MCG/ML IJ SOLN
1000.0000 ug | Freq: Once | INTRAMUSCULAR | Status: AC
  Administered 2010-09-04: 1000 ug via INTRAMUSCULAR

## 2010-09-04 NOTE — Progress Notes (Signed)
Addended byAlfred Levins on: 09/04/2010 09:28 AM   Modules accepted: Orders

## 2010-09-10 ENCOUNTER — Encounter: Payer: Self-pay | Admitting: Internal Medicine

## 2010-09-11 ENCOUNTER — Encounter: Payer: Self-pay | Admitting: Internal Medicine

## 2010-09-11 ENCOUNTER — Ambulatory Visit (INDEPENDENT_AMBULATORY_CARE_PROVIDER_SITE_OTHER): Payer: Medicare Other | Admitting: Internal Medicine

## 2010-09-11 DIAGNOSIS — E785 Hyperlipidemia, unspecified: Secondary | ICD-10-CM

## 2010-09-11 DIAGNOSIS — K219 Gastro-esophageal reflux disease without esophagitis: Secondary | ICD-10-CM

## 2010-09-11 DIAGNOSIS — K2270 Barrett's esophagus without dysplasia: Secondary | ICD-10-CM

## 2010-09-11 DIAGNOSIS — J449 Chronic obstructive pulmonary disease, unspecified: Secondary | ICD-10-CM

## 2010-09-11 DIAGNOSIS — J4489 Other specified chronic obstructive pulmonary disease: Secondary | ICD-10-CM

## 2010-09-11 DIAGNOSIS — I10 Essential (primary) hypertension: Secondary | ICD-10-CM

## 2010-09-11 MED ORDER — ROSUVASTATIN CALCIUM 20 MG PO TABS: 20.0000 mg | ORAL_TABLET | Freq: Every day | ORAL | Status: DC

## 2010-09-11 MED ORDER — IBANDRONATE SODIUM 150 MG PO TABS: ORAL_TABLET | ORAL | Status: DC

## 2010-09-11 MED ORDER — ESCITALOPRAM OXALATE 10 MG PO TABS: 10.0000 mg | ORAL_TABLET | Freq: Every day | ORAL | Status: DC

## 2010-09-11 NOTE — Assessment & Plan Note (Signed)
Controlled Continue meds 

## 2010-09-11 NOTE — Assessment & Plan Note (Signed)
Current non smoker She is tring to ride an exercise bike daily

## 2010-09-11 NOTE — Assessment & Plan Note (Signed)
Controlled Continue current meds 

## 2010-09-11 NOTE — Assessment & Plan Note (Signed)
Not controlled Will change meds---see list Side effects discussed

## 2010-09-15 NOTE — Progress Notes (Signed)
  Subjective:    Patient ID: Pamela Hampton, female    DOB: Aug 10, 1940, 70 y.o.   MRN: [id number]  HPI  F/u multiple medical problems including lipids, htn, copd She is generally doing well, SOB is chronic but unchanged. She is complaint with meds Not exercising  Past Medical History  Diagnosis Date  . Allergy     Rhinitis  . COPD (chronic obstructive pulmonary disease)   . Depression   . GERD (gastroesophageal reflux disease)     Barrett's esophagus  . Hyperlipidemia   . Hypertension   . Osteoporosis   . Seizures   . Cancer     Ovarian  . Barrett's esophagus 07/19/2004  . Anemia   . Obesity   . Pneumonia   . Stroke   . Sleep apnea   . Hepatitis    Past Surgical History  Procedure Date  . Orif hip fracture   . Cholecystectomy   . Hemorrhoid surgery   . Total hip arthroplasty   . Abdominal hysterectomy   . Surgery for ? ovarian cancer/cervical cancer     reports that she has quit smoking. She does not have any smokeless tobacco history on file. She reports that she does not drink alcohol or use illicit drugs. family history includes Cancer in her brother and father; Diabetes in her mother, sister, and sons; and Heart disease in her mother.    No Known Allergies    Review of Systems  patient denies chest pain, , orthopnea. Denies lower extremity edema, abdominal pain, change in appetite, change in bowel movements. Patient denies rashes, musculoskeletal complaints. No other specific complaints in a complete review of systems.      Objective:   Physical Exam  Well-developed well-nourished female in no acute distress. HEENT exam atraumatic, normocephalic, extraocular muscles are intact. Neck is supple. No jugular venous distention no thyromegaly. Chest clear to auscultation (prolonged expiratory time) without increased work of breathing. Cardiac exam S1 and S2 are regular. Abdominal exam active bowel sounds, soft, nontender. Extremities no edema. Neurologic exam she  is alert without any motor sensory deficits. Gait is normal.        Assessment & Plan:

## 2010-09-15 NOTE — Assessment & Plan Note (Signed)
Continue PPI ?

## 2010-09-23 ENCOUNTER — Other Ambulatory Visit: Payer: Self-pay | Admitting: Internal Medicine

## 2010-09-23 MED ORDER — FLUTICASONE PROPIONATE 50 MCG/ACT NA SUSP: 2.0000 | Freq: Every day | NASAL | Status: DC

## 2010-10-23 ENCOUNTER — Other Ambulatory Visit: Payer: Self-pay | Admitting: Internal Medicine

## 2010-10-23 ENCOUNTER — Other Ambulatory Visit (INDEPENDENT_AMBULATORY_CARE_PROVIDER_SITE_OTHER): Payer: Medicare Other | Admitting: Internal Medicine

## 2010-10-23 DIAGNOSIS — E785 Hyperlipidemia, unspecified: Secondary | ICD-10-CM

## 2010-10-23 LAB — LIPID PANEL
HDL: 38.8 mg/dL — ABNORMAL LOW (ref >39.00)
Total CHOL/HDL Ratio: 4
VLDL: 40.2 mg/dL — ABNORMAL HIGH (ref 0.0–40.0)

## 2010-10-23 LAB — HEPATIC FUNCTION PANEL
Albumin: 3.5 g/dL (ref 3.5–5.2)
Alkaline Phosphatase: 54 U/L (ref 39–117)
Bilirubin, Direct: 0 mg/dL (ref 0.0–0.3)
Total Bilirubin: 0.1 mg/dL — ABNORMAL LOW (ref 0.3–1.2)

## 2010-10-30 ENCOUNTER — Ambulatory Visit (INDEPENDENT_AMBULATORY_CARE_PROVIDER_SITE_OTHER): Payer: Medicare Other | Admitting: Internal Medicine

## 2010-10-30 ENCOUNTER — Encounter: Payer: Self-pay | Admitting: Internal Medicine

## 2010-10-30 VITALS — BP 116/62 | HR 84 | Temp 98.0°F | Wt 178.0 lb

## 2010-10-30 DIAGNOSIS — E538 Deficiency of other specified B group vitamins: Secondary | ICD-10-CM

## 2010-10-30 DIAGNOSIS — E785 Hyperlipidemia, unspecified: Secondary | ICD-10-CM

## 2010-10-30 DIAGNOSIS — N390 Urinary tract infection, site not specified: Secondary | ICD-10-CM

## 2010-10-30 DIAGNOSIS — R300 Dysuria: Secondary | ICD-10-CM

## 2010-10-30 LAB — POCT URINALYSIS DIPSTICK
Ketones, UA: NEGATIVE
Leukocytes, UA: NEGATIVE
Urobilinogen, UA: 0.2
pH, UA: 5

## 2010-10-30 MED ORDER — POTASSIUM CHLORIDE CRYS ER 20 MEQ PO TBCR: 20.0000 meq | EXTENDED_RELEASE_TABLET | Freq: Every day | ORAL | Status: DC | PRN

## 2010-10-30 MED ORDER — CYANOCOBALAMIN 1000 MCG/ML IJ SOLN
1000.0000 ug | INTRAMUSCULAR | Status: DC
  Administered 2010-10-30 – 2011-04-03 (×2): 1000 ug via INTRAMUSCULAR

## 2010-10-30 NOTE — Assessment & Plan Note (Signed)
Much improved. Continue current meds 

## 2010-10-30 NOTE — Progress Notes (Signed)
  Subjective:    Patient ID: Pamela Hampton, female    DOB: 09-12-1940, 70 y.o.   MRN: [id number]  HPI  F/u lipids---reviewed last note. She is tolerating her medications without difficulty.  Reviewed medication changes  Hypertension. Tolerating medications without difficulty. No home blood pressures are monitored.  COPD. Patient has a long history of COPD. She is doing reasonably well on current medications.    Past Medical History  Diagnosis Date  . Allergy     Rhinitis  . COPD (chronic obstructive pulmonary disease)   . Depression   . GERD (gastroesophageal reflux disease)     Barrett's esophagus  . Hyperlipidemia   . Hypertension   . Osteoporosis   . Seizures   . Cancer     Ovarian  . Barrett's esophagus 07/19/2004  . Anemia   . Obesity   . Pneumonia   . Stroke   . Sleep apnea   . Hepatitis    Past Surgical History  Procedure Date  . Orif hip fracture   . Cholecystectomy   . Hemorrhoid surgery   . Total hip arthroplasty   . Abdominal hysterectomy   . Surgery for ? ovarian cancer/cervical cancer     reports that she has quit smoking. She does not have any smokeless tobacco history on file. She reports that she does not drink alcohol or use illicit drugs. family history includes Cancer in her brother and father; Diabetes in her mother, sister, and sons; and Heart disease in her mother. No Known Allergies    Review of Systems  patient denies chest pain, shortness of breath, orthopnea. Denies lower extremity edema, abdominal pain, change in appetite, change in bowel movements. Patient denies rashes, musculoskeletal complaints. No other specific complaints in a complete review of systems.      Objective:   Physical Exam Elderly female in no acute distress. HEENT exam: Atraumatic, normocephalic, edentulous. Neck is supple without jugular venous distention. Chest clear to auscultation without increased work of breathing. There may be some decreased breath sounds  in both bases bilaterally. Prolonged expiratory time. Abdominal exam overweight, and bowel sounds, soft. Extremities no edema.       Assessment & Plan:  dysuria---check culture.

## 2010-11-01 LAB — URINE CULTURE

## 2010-11-04 ENCOUNTER — Encounter: Payer: Self-pay | Admitting: Internal Medicine

## 2010-11-27 ENCOUNTER — Ambulatory Visit: Payer: Medicare Other | Admitting: Internal Medicine

## 2010-12-05 ENCOUNTER — Ambulatory Visit (INDEPENDENT_AMBULATORY_CARE_PROVIDER_SITE_OTHER): Payer: Medicare Other | Admitting: Internal Medicine

## 2010-12-05 DIAGNOSIS — E538 Deficiency of other specified B group vitamins: Secondary | ICD-10-CM

## 2010-12-05 NOTE — Progress Notes (Signed)
B12 given

## 2010-12-13 NOTE — Discharge Summary (Signed)
NAME:  Pamela Hampton, Pamela Hampton                         ACCOUNT NO.:  [id number]   MEDICAL RECORD NO.:  [id number]                   PATIENT TYPE:  INP   LOCATION:  5024                                 FACILITY:  [facility]   PHYSICIAN:  Madlyn Frankel. Charlann Boxer, M.D.               DATE OF BIRTH:  1940/11/25   DATE OF ADMISSION:  05/23/2003  DATE OF DISCHARGE:  05/31/2003                                 DISCHARGE SUMMARY   ADMISSION DIAGNOSES:  1. Intertrochanteric fracture of the right hip with subtrochanteric     extension.  2. Gastroesophageal reflux disease.  3. Hypercholesterolemia.  4. Hypertension.  5. Depression.  6. Chronic obstructive pulmonary disease.  7. History of seizures.  8. History of ovarian cancer.  9. History of cerebrovascular accident.   DISCHARGE DIAGNOSES:  1. Intertrochanteric fracture of the right hip with subtrochanteric     extension status post long gamma nail to the right hip and femur.  2. Gastroesophageal reflux disease.  3. Hypercholesterolemia.  4. Hypertension.  5. Depression.  6. Chronic obstructive pulmonary disease.  7. History of seizures.  8. History of ovarian cancer.  9. History of cerebrovascular accident.  10.      Postop hemorrhagic anemia.   PROCEDURES:  The patient was taken to the operating room on May 25, 2003, and underwent long gamma nail to the right hip.  Surgeon was Dr.  Raylene Everts.  Assistant was [employer].  Surgery was done under  general anesthesia.   CONSULTATIONS:  1. Dr. Crista Curb with [facility] hospitalists.  2. Physical medicine rehabilitation with Dr. Riley Kill.  3. Physical therapy, occupational therapy and social work case management.   BRIEF HISTORY:  The patient is a 70 year old female status post a fall on  May 23, 2003.  She states that she kind of lost her balance and fell.  She fell and had immediate onset of right hip pain.  She was subsequently  brought to the [facility] emergency department  where she was noted to have  right intertrochanteric fracture with subtrochanteric extension.  Dr. Charlann Boxer  felt it was best to proceed with surgery.  Risks and benefits were discussed  with the patient and her family and they agreed to proceed.   LABORATORY DATA:  CBC on admission:  Hemoglobin 13.0, hematocrit 39.7, white  blood cell count 7.4, red blood cell count 4.20.  Serial H&H's were followed  throughout the hospital stay.  Hemoglobin and hematocrit did decline to 7.0  and 21.3 on May 27, 2003.  However, was back up to 9.7 and 28.6,  respectively, status post two units of packed red blood cells.  On May 30, 2003, hemoglobin and hematocrit declined again to 8.6 and 26.2.  Hemoglobin and hematocrit on November 3 are pending at this time.  Differential on admission was within normal limits.  Coagulation studies on  admission showed PT high at  15.2, PTT high at 49.  Routine chemistry on  admission showed sodium slightly low at 134, potassium low at 2.6, chloride  low at 94 and BUN high at 43.  Serial chemistries were followed throughout  hospital stay.  Potassium did decline to 3.6 on May 24, 2003, prior to  surgery.  Chloride remained low at 91.  CO2 high at 35 and BUN high at 32.  Sodium returned to normal at 136.  Follow up chemistry on May 26, 2003,  showed potassium stayed within normal limits.  Chloride returned to normal  limits at 104.  Glucose became high at 135, and BUN returned to normal at  10.  Cardiac monitors on May 23, 2003, showed CK 75, CK MB 1.5, troponin  less than 0.01.  Depakote level on May 23, 2003, was high at 141.  On  May 29, 2003, Depakote level was at 32.3.  Urinalysis on admission  showed 15 mg/dL of ketones, otherwise, normal.  Follow up urinalysis on  May 27, 2003, showed color amber, appearance cloudy, small amount of  hemoglobin, small amount of bilirubin, 15 mg/dL of ketones, 30 mg/dL of  protein, a small amount of  leukocyte esterase.  The patient's blood type was  O positive and antibody screen negative.  EKG on admission showed normal  sinus rhythm with nonspecific ST-T wave abnormality.  Preop x-rays of the  chest revealed no evidence of acute coronary or pulmonary disease.  Preop  head CT revealed no evidence of acute intracranial abnormality.  Preop x-  rays of the right hip revealed subtrochanteric right proximal femur  fracture.  Portable chest done on May 27, 2003, revealed bibasilar  atelectasis.   HOSPITAL COURSE:  The patient was admitted to [facility] and taken  to the operating room.  She underwent the above stated procedure without  complications.  The patient tolerated the procedure well, allowed to return  to recovery floor on orthopedic floor and continue postoperative care.  Dr.  Lendell Caprice of [facility] followed along with [place] monitoring the patient's  medications and adjusting them appropriately until she was stable for  discharge.  On May 24, 2003, the patient was to go to the OR after  having her potassium and electrolytes stabilized.  On May 25, 2003,  after being unable to stabilize her electrolytes on the previous day, the  patient's electrolytes were finally stabilized and she was able to proceed  to the operating room on May 25, 2003.  On October 29, postop day #1,  the patient was doing well, neurovascularly intact left lower extremity.  Lovenox for DVT prophylaxis.  Continued vancomycin for antibiotic  prophylaxis.  The patient was weightbearing as tolerated left lower  extremity.  On October 29, physical management and rehabilitation consult  was given with the recommendation that the patient return to skilled nursing  facility for placement.  On May 27, 2003, the patient was resting  comfortably, had a T max of 100.1.  The patient was continued on Lovenox.  She was neurovascularly intact to the right lower extremity.  Dressing was changed.   Incision was clean, dry and intact.  Hemoglobin and hematocrit  were noted to be 7.0 and 21.3.  She was subsequently transfused two units of  packed red blood cells.  The patient also had an episode of increased white  blood cell count and fever and hypotension.  The patient was subsequently  transferred to 3300 step-down unit per Dr. Rito Ehrlich and started on  IV Avelox  for prophylaxis.  On October 31, postop day #3, the patient's hemoglobin and  hematocrit rose to 9.5 and 28.1, respectively, status post two units of  packed red blood cells.  Incision remained clean, dry and intact.  The  patient's neurovascular status remained intact.  The patient seemed to be  improving after transferred to stepdown unit.  The patient was to continue  working with physical therapy/occupational therapy with disposition to  skilled nursing facility later in the week.  May 29, 2003, postop day  #4, the patient is resting comfortably, looking much better.  However,  hemoglobin and hematocrit were 8.9 and 26.6.  the patient was  neurovascularly intact right lower extremity.  Incision remained clean, dry  and intact.  The patient was to continue working physical therapy and  occupational therapy.  Social work consult was obtained for a skilled  nursing facility placement, as the patient is not a candidate for  rehabilitation or subacute unit.  On May 30, 2003, postop day #5, the  patient was awake.  Minimal confusion.  Was orthopedically stable at this  point. Neurovascularly intact in the right lower extremity.  Incisions were  clean, dry and intact.  The patient was waiting on transfer to the skilled  nursing facility.  On May 31, 2003, postop day #6, the patient was  resting comfortably, was disoriented to place and situation.  Neurovascularly intact right lower extremity.  Incisions were clean, dry and  intact.  Spoke with the medical doctor, Dr. Lendell Caprice, who states the patient  is okay for  transfer today.  All narcotics were discontinued at this point.  Pending CBC and hemoglobin and hematocrit, the patient will be transferred  to [facility] on this day.   DISPOSITION:  The patient was discharged to [facility] skilled nursing  facility on May 31, 2003.   DISCHARGE MEDICATIONS:  1. Lexapro 10 mg one p.o. daily.  2. Pravachol 40 mg one p.o. q.1800 hours.  3. Lisinopril 5 mg one p.o. daily.  4. Ventolin inhaler 2.5 mg per 3 mm nebulized solution, inhaled q.6h.  5. Atrovent 0.5 mg per 2.5 ml nebulized solution, inhaled q.6h.  6. Nicotine patch, 21 mg for 24 hours, transdermal.  7. Aspirin 81 mg p.o. daily.  8. Protonix 40 mg one p.o. daily.  9. Colace 100 mg one p.o. b.i.d.  10.      Trinsicon one caplet p.o. t.i.d.  11.      Depakote 500 mg one p.o. daily.  12.      Protective Barrier cream one application topical daily.  13.      Laxative of choice one unit p.o. p.r.n.  14.      Enema of choice one unit p.r.n. 15.      Reglan 10 mg p.o. q.8h. p.r.n. nausea.  16.      Phenergan 25 mg p.o. q.6h. p.r.n. nausea.  17.      Phenergan suppositories 25 mg q.6h. p.r.n. nausea.  18.      Tylenol 325-650 mg p.o. q.4-6h. p.r.n. pain.   DIET:  As tolerated.   ACTIVITY:  The patient is weightbearing as tolerated to the right lower  extremity.  No need for total hip precautions.  Wound care.  Continue daily  dressing changes until no drainage.  The patient may shower on postop day  #5.   FOLLOWUP:  The patient is to follow up with Dr. Charlann Boxer two weeks from the day  of  surgery.  The office is to be called for an appointment at [phone].   CONDITION ON DISCHARGE:  Stable and improved.      Clarene Reamer, P.A.-C.                   Madlyn Frankel Charlann Boxer, M.D.    SW/MEDQ  D:  05/31/2003  T:  05/31/2003  Job:  [id number]

## 2010-12-13 NOTE — H&P (Signed)
NAMEJOETTA, DELPRADO               ACCOUNT NO.:  [id number]   MEDICAL RECORD NO.:  [id number]          PATIENT TYPE:  INP   LOCATION:  4705                         FACILITY:  [facility]   PHYSICIAN:  Valetta Mole. Swords, M.D. [facility] OF BIRTH:  1941/05/29   DATE OF ADMISSION:  07/18/2004  DATE OF DISCHARGE:                                HISTORY & PHYSICAL   CHIEF COMPLAINT:  Weakness.   HISTORY OF PRESENT ILLNESS:  Ms. Nine is a 70 year old female with multiple  medical problems who comes in with a 2-3 week history of progressive  shortness of breath and weakness.  She has chronic obstructive pulmonary  disease, O2 dependent.  She, however, does not feel that this is a typical  exacerbation or COPD because there is no significant wheezing, no cough.  She denies any abdominal pain, nausea or vomiting.  She states she has had a  poor appetite, but no other specific complaints other than just weakness and  a feeling of lightheadedness when she stands.   PAST MEDICAL HISTORY:  Extensive.  Significant for:  1.  A seizure disorder.  2.  COPD.  3.  Depression.  4.  Gastroesophageal reflux disease.  5.  Hyperlipidemia.  6.  Allergic rhinitis.  7.  Osteoporosis.  8.  She has a history of ovarian or cervical cancer.  She cannot remember.      She is status post total abdominal hysterectomy and a bilateral salpingo-      oophorectomy.  She did receive radiation therapy and chemotherapy.  She      does not know the details.  9.  She has also had a hip fracture requiring surgery.  10. Cholecystectomy.  11. Hemorrhoidectomy.  12. Total abdominal hysterectomy.   CURRENT MEDICATIONS:  1.  Actonel 35 mg p.o. every week.  2.  Lexapro 10 mg p.o. daily.  3.  Trazodone 50 mg p.o. q.h.s.  4.  Aciphex 20 mg p.o. daily.  5.  Depakote-ER 500 mg 3 p.o. daily.   ALLERGIES:  She has no known drug allergies.   She denies any recent nonsteroidal anti-inflammatory drug use.   SOCIAL HISTORY:  She lives  in an apartment.  She is an ex-smoker.  She is  divorced.  She has four kids, all healthy.  She quit smoking approximately  one year ago.  She does not drink any alcohol, currently.   FAMILY HISTORY:  Mother deceased with an MI at 51.  Father deceased with  stomach cancer at age 66.  Brothers:  She has three brothers, one alive with  diabetes, one alive with  head and neck cancer.  She has four sisters, all  alive and well.   PHYSICAL EXAMINATION:  VITAL SIGNS:  Temperature 98.  Pulse 74.  Respirations 16.  Blood pressure 122/68.  GENERAL:  She appears as a chronically ill, elderly female in no acute  distress.  She appears older than her stated age of 42.  HEENT:  Atraumatic and normocephalic.  Extraocular muscles are intact.  Conjunctivae appear pale as does oral mucosa.  Oral mucosa is, otherwise  moist and without exudates.  NECK:  Supple without lymphadenopathy, thyromegaly, jugular vein distention.  CHEST:  Clear to auscultation without any increased work at breathing.  CARDIAC:  S1 and S2 are normal without murmurs or gallops.  ABDOMEN:  Active bowel sounds, soft, nontender.  There is no  hepatosplenomegaly.  No masses are palpated.  EXTREMITIES:  There is no clubbing, cyanosis or edema.  NEUROLOGIC:  She is alert and oriented without any motor or sensory  deficits.   LABORATORIES:  The only thing done in the office was a hemoglobin which is  8.5.   EKG demonstrates normal sinus rhythm.   ASSESSMENT AND PLAN:  1.  Anemia compared to previous hemoglobins which was found upon chart      review and computer review is significantly lower.  Her baseline      hemoglobin is 12.6.  I think she probably has an upper gastrointestinal      bleed (note, she states she had a colonoscopy approximately one to two      years ago in [place]).  She needs to be hospitalized.  I think she      ought to be transfused given her symptoms and especially in light of her      chronic  obstructive pulmonary disease.  I also think she needs further      gastroenterology evaluation.  We will put her empirically on Protonix 40      mg IV.  2.  Chronic obstructive pulmonary disease.  Her pulse oximetry in the office      ranged from 89 to 97%.  I do not think she needs anything other than her      usual oxygen.  3.  Fluid/electrolyte nutrition.  We will allow her to have a clear-liquid      diet tonight.  We will keep NPO after midnight.      BHS/MEDQ  D:  07/18/2004  T:  07/18/2004  Job:  [id number]   cc:   Barbette Hair. Arlyce Dice, M.D. [facility]

## 2010-12-13 NOTE — Op Note (Signed)
Pamela Hampton, Pamela Hampton               ACCOUNT NO.:  [id number]   MEDICAL RECORD NO.:  [id number]          PATIENT TYPE:  INP   LOCATION:  0159                         FACILITY:  [facility]   PHYSICIAN:  Madlyn Frankel. Charlann Boxer, M.D.  DATE OF BIRTH:  [birth year]   DATE OF PROCEDURE:  12/12/2005  DATE OF DISCHARGE:                                 OPERATIVE REPORT   PREOPERATIVE DIAGNOSIS:  Right hip femoral head avascular necrosis status  post open reduction and internal fixation of a right intertrochanteric femur  fracture with a long Gamma nail.  Note, that the intertrochanteric femur  fracture had healed and is approximately 89-month-old with late development  and onset of avascular necrosis of the femoral head.   POSTOPERATIVE DIAGNOSIS:  Right hip femoral head avascular necrosis status  post open reduction and internal fixation of a right intertrochanteric femur  fracture with a long Gamma nail.  Note, that the intertrochanteric femur  fracture had healed and is approximately 37-month-old with late development  and onset of avascular necrosis of the femoral head.   PROCEDURE:  Conversion of previous right hip surgery to a right total hip  replacement.   COMPONENTS USED:  DePuy hip system with a size 50 mm pinnacle cup, two  cancellous bone screws, 36 neutral metal liner.  S-ROM femoral component, 20  by 15 extra extra long stem with 20D small sleeve and a 36 plus 0 ball.  I  did utilize a femoral strut graft and four Osteonics cables distally.   SURGEON:  Madlyn Frankel. Charlann Boxer, M.D.   ASSISTANT:  Alexzandrew L. Perkins, P.A.-C.   ANESTHESIA:  Regional   BLOOD LOSS:  1 liter.   COMPLICATIONS:  The patient was noted to have an interoperative distal femur  fracture upon removal of the nail.   IV FLUIDS:  Included 3 units of packed red blood cells to maintain  hemoglobin greater than 11.   INDICATIONS:  Ms. Fenstermaker is a very pleasant 70 year old female who has been a  patient of mine since an  intertrochanteric femur fracture.  She had a long  Gamma nail fixation of this infection and subsequently developed healed  intertrochanteric fracture.  This was complicated by some lateral hip pain  felt due to significant compression through her lag screw.  This seemed to  dissipate.  She has also had some problems with some sciatic symptoms down  the right lower extremity.  Late developments were the onset of some groin  pain, a difficult time with mobility, pain with motion, and pain with weight  bearing.  Radiographs at this time revealed that she had femoral head  avascular necrosis with femoral head collapse and over the top of the lag  screw.   After these findings, we discussed the implications.  Nonoperative  interventions, at this point, would provide very little benefit to her at  all and so we discussed converting her to a total hip replacement.  The  risks and benefits were discussed including infection, dislocation,  component failure, persistent discomfort postoperatively.  Consent was  obtained.   PROCEDURE IN DETAIL:  The  patient was brought to the operative theater.  Once adequate anesthesia which was a spinal regional anesthesia, the patient  was positioned in the left lateral decubitus position with the right side  up.  The right lower extremity was prepped and draped in a sterile fashion  from the hip to the knee to remove lag screws.  Following this, a lateral  based incision was made at the hip for posterior approach to the hip.  The  hip exposure was obtained.  The hip was dislocated.  The lag screw hole was  identified.  At this point, I opened up the gluteus medius tendon at its  attachment on the trochanter to allow for exposure of the top portion of the  long nail.  Once this was identified, using an S-ROM box osteotome, I was  able to expose the proximal femur enough to loosen the screw that held the  lag screw in place.  Once this was done, I was able to  remove the lag screw  and the lag screw was removed through this same incision without  complication.   At this point, attention was redirected to the proximal femur to make sure  that I could attach the extraction device.  Once this was done, attention  was directed distally.  At this point, I removed a single lag screw. The  patient did have two lag screws in place, but I only removed one.  This was  not planned.  Following the removal of a single lag screw, I tried to remove  the nail and met resistance. It was at this point that we noticed that there  was a distal femur split as a result of the distal interlock screw that  remained.   Given this finding, I went ahead and extended the incision distally to  expose the fracture site.  The fracture site was exposed without  complication.  The remaining interlock screw was removed, the fracture site  identified, and held in position with wires initially.  Once this was  carried out, the plan was to strut this with a strut allograft and place  cables but utilize a longer stem to help support the fracture.  Once the  fracture was reduced anatomically and 16 gauge wires placed around the  fracture, attention was redirected back to the proximal femur.   At this point, with a stable femur, the hip was dislocated.  A neck  osteotomy was made based off the tip of the trochanter for an S-ROM.  The  proximal femoral debridement was carried out as necessary to allow for  exposure of the proximal femur.  I then sequentially reamed up to a 15 mm  straight reamer.  This helped to determine the size of my proximal femur for  position of the stem.  Following this, I went ahead and put a guidewire  distally and reamed distally with flexible reamers up to a 16 mm reamer.  This gave good chatter distally.  This would allow for stabilization of the fracture site, as well.  At this point, proximal reaming was carried out up  to an 20D.  I then milled the  proximal femur to an 20D small.  The 20D small  trial was then impacted and the trial reduced first with the extra long and  then the extra extra long.  Once I confirmed that this passed without  complication and without any problems at the distal femur, we proceeded to  the acetabulum.  Please  note that the D small sleeve was positioned in about  20-25 degrees of anteversion based on the perpendicular tibia.   Acetabular exposure was obtained routinely without complicating features.  Labrectomy was carried out.  Reaming commenced with a 43 reamer and then was  carried up sequentially to a 49 reamer.  I was then able to impact a 50 mm  pinnacle cup with excellent good initial scratch fit.  Following this, two  cancellous screws were placed and a trial liner was positioned.   At this point, trial reduction was carried out with at first the extra long  stem.  This was a 36 plus 8 offset.  The leg lengths looked equal and there  was some tightness of the anterior structures related to some anterior  capsular contracture from her previous surgical procedures.  This was noted  and then later a capsulotomy was carried out anteriorly to allow for some  further mobilization and decreased tension anteriorly.  I did feel leg  lengths were equal.  The hip was very stable.   I did obtain an interoperative radiograph this point which identified a  stable reduced fracture distally.  I chose to increase my stem length to the  extra extra long stem to bypass the screw holes to prevent stress riser.  Given this, the trial components were removed.  A central hole eliminator  was placed into the cup and the final 36 metal liner was impacted into the  cup.  At this point, the trial sleeve was removed and the final 20D small  sleeve was impacted to the level where the trial was placed. The final 20 x  15 extra extra long stem was then placed into a neutral orientation to the  sleeve, again at about 25  degrees of anteversion.  This was done under  direct visualization distally.  The fracture did not move its orientation  due to the wires present.   Following position of the stem and impacting it down without complication,  the hip was reduced again with trial reduction to make sure we were OK.  The  final 36 plus 0 ball was impacted on a clean and dry trunnion.  The hip was  reduced.  Note that a capsulotomy was carried out prior to placing the final  component.   At this point, attention was directed distally.  In order to stabilize the  fracture and provide a little bit more support to this area, I did place a  femoral cortical strut allograft on the lateral aspect of the femur.  Two of  the wires remained in place to hold the fracture together.  Following this  position, the use of a Verbrugge clamp was used to hold the graft in place and four cables were placed, two proximal and two distal to the fracture  area.  These were tensioned to 100 pounds pressure and crimped and cut.   Throughout the case and again at this point, the wound was copiously  irrigated with normal saline solution.  She had received a second dose of  prophylactic antibiotics interoperatively.  At this point, due to the  elevation of the vastus lateralis off of the iliotibial band, I  reapproximated the iliotibial band with #1 running Vicryl from the distal  aspect of the thigh to the vastus ridge.  Proximally at the hip, I did  reapproximate the posterior capsule using #1 Ethilon.  The gluteal fascia  and the remaining portion of the tensor fascia  and the iliotibial band were  reapproximation #1 Vicryl.  The remainder of the wound was closed in layers  with Vicryl and staples on the skin.  The hip was cleaned, dried, and  dressed sterilely with Adaptic dressing sponges and tape.  A medium Hemovac  drain was placed deep to allow for drainage of the hip joint.  The patient  was then brought to the recovery room  without complication in stable  condition.   POSTOPERATIVE PLAN:  Will call for the patient to be touchdown weight  bearing for the first six weeks with progression from that point.  Postoperative radiographs were reviewed in the recovery room and found that  the hip center looked anatomic, fracture site was anatomic, and components  distally including the allograft were positioned without further problems.      Madlyn Frankel Charlann Boxer, M.D.  Electronically Signed     MDO/MEDQ  D:  12/12/2005  T:  12/12/2005  Job:  [id number]

## 2010-12-13 NOTE — Discharge Summary (Signed)
Pamela Hampton, Pamela Hampton               ACCOUNT NO.:  [id number]   MEDICAL RECORD NO.:  [id number]          PATIENT TYPE:  INP   LOCATION:  4705                         FACILITY:  [facility]   PHYSICIAN:  Valetta Mole. Swords, M.D. [facility]   DATE OF BIRTH:   DATE OF ADMISSION:  07/18/2004  DATE OF DISCHARGE:  07/22/2004                                 DISCHARGE SUMMARY   ADMISSION DIAGNOSIS:  Severe anemia.   DISCHARGE DIAGNOSES:  1.  Severe anemia, corrected, after the transfusion of 2 units of packed red      blood cells.  2.  Status post colonoscopy, which was basically normal, and an      esophagogastroduodenoscopy which showed erosions in the hiatal hernia      and possibly Barrett's esophagus.  3.  Other medical problems are stable.   HISTORY OF PRESENT ILLNESS:  The patient is a 70 year old white female, with  multiple medical problems, who presented to Dr. Valetta Mole. Swords with a two  to three-week-history of progressive shortness of breath, felt not to be a  regular chronic obstructive pulmonary disease exacerbation, as the patient  was not having any significant respiratory problems.   PAST MEDICAL/SURGICAL HISTORY:  1.  Seizure disorder.  2.  Chronic obstructive pulmonary disease.  3.  Depression.  4.  Gastroesophageal reflux disease.  5.  High cholesterol.  6.  Allergic rhinitis.  7.  Osteoporosis.  8.  Gynecological cancer which she cannot remember exactly what it was.  9.  Hip fracture.  10. Cholecystectomy.  11. Hemorrhoidectomy.  12. Total abdominal hysterectomy.   PHYSICAL EXAMINATION:  VITAL SIGNS:  Temperature 98 degrees, pulse 74,  respirations 16, blood pressure 122/68.  GENERAL:  She appears chronically ill, an elderly female, in no acute  distress.  CHEST:  Clear to a auscultation bilaterally without any increased work of  breathing.  HEART:  Regular.  ABDOMEN:  Active bowel sounds, nontender.  No organomegaly.  EXTREMITIES:  No edema.   LABORATORY/X-RAY DATA:  A  portable chest x-ray showed borderline  cardiomegaly with some bronchitis changes.  Initial hemoglobin 7.8, with white count of 6.9, platelets 318.  Potassium  4.0, creatinine 0.6. AST and ALT normal.  Calcium slightly low at 8.2.  PT  was normal, PTT was also normal.  Hemoglobin after transfusion was stable at  around 11.   HOSPITAL COURSE:  #1 - SEVERE SYMPTOMATIC ANEMIA:  GI was consulted.  They  did a colonoscopy which was normal, and also an EGD that showed erosions in  the hiatal hernia sac and possible Barrett's esophagus.  After the  endoscopy, really no cause has been identified for anemia; however, we  suspect that this is due to chronic NSAID use that could also cause some  small bowel lesions.  At this point, we think that she got maximal hospital  benefit, and if she is able to tolerate her food, she will be able to go  home later on today.  The patient indeed said that she would be ready to go  home.  #2 - OTHER MEDICAL PROBLEMS:  The rest of her medical problems seem to be  stable at this time.   DISCHARGE INSTRUCTIONS/FOLLOWUP:  1.  Continue taking all her medications as before.  2.  Hold Actonel until she talks with her primary care doctor.  It may be a      good idea to hold it for a few weeks.  3.  Do not take any form of NSAID, including Advil, aspirin, Motrin or BC's.  4.  Start iron pills t.i.d.  5.  The patient was taking Aciphex once daily.  She is to take Aciphex      b.i.d.  6.  Come back to the hospital any time that there is fever, nausea,      vomiting, abdominal pain, or blood in the stools.  7.  Call Dr. Valetta Mole. Swords next week for followup within a few days.      JEP/MEDQ  D:  07/20/2004  T:  07/22/2004  Job:  [id number]

## 2010-12-13 NOTE — H&P (Signed)
NAME:  Pamela Hampton, Pamela Hampton                         ACCOUNT NO.:  [id number]   MEDICAL RECORD NO.:  [id number]                   PATIENT TYPE:  INP   LOCATION:  5524                                 FACILITY:  [facility]   PHYSICIAN:  Madlyn Frankel. Charlann Boxer, M.D.               DATE OF BIRTH:  [birth year]   DATE OF ADMISSION:  05/23/2003  DATE OF DISCHARGE:                                HISTORY & PHYSICAL   CHIEF COMPLAINT:  Pain in the right hip.   HISTORY OF PRESENT ILLNESS:  The patient is a 70 year old female status post  a fall on October 26.  However, she is a poor historian as to why she fell.  She does state though that she kind of lost her balance and fell.  She she  fell she had immediate onset of right hip pain.  She was subsequently  brought to the [facility] Emergency Department where it was noted that she  had a right intertrochanteric fracture with subtrochanteric extension.  Dr.  Charlann Boxer felt that it was best to proceed with surgery.  The patient and her  family agreed.  The risks and benefits of surgery have been discussed with  the patient and her family and they wish to proceed.   PAST MEDICAL HISTORY:  1. Gastroesophageal reflux disease.  2. Hypercholesterolemia.  3. Hypertension.  4. Depression.  5. COPD.  6. History of seizures.  7. Ovarian cancer.  8. History of cerebrovascular accident.   PAST SURGICAL HISTORY:  1. Cholecystectomy.  2. Splenectomy.  3. Exploratory laparotomies.  4. Hysterectomy.   MEDICATIONS:  1. Depakote 500 mg 1 p.o. t.i.d.  2. Aciphex 20 mg 1 p.o. daily.  3. Lexapro 10 mg 1 p.o. daily  4. Pravachol 40 mg 1 p.o. q.h.s.  5. Lisinopril 10 mg 1/2 p.o. daily.  6. Aspirin 1 p.o. daily.  7. Lasix dosage and schedule not known.   ALLERGIES:  No known drug allergies.   SOCIAL HISTORY:  The patient is positive for tobacco.  Smokes 2-3 packs per  day.  Denies any alcohol.  She is a resident of [facility].   FAMILY HISTORY:  Mother deceased with  hypertension and a myocardial  infarction.  Father deceased with lung disease.   REVIEW OF SYSTEMS:  Review of systems was unable to be accomplished due to  the patient being a poor historian.   PHYSICAL EXAMINATION:  VITAL SIGNS:  Blood pressure 106/58, pulse 70,  respirations 26.  GENERAL:  A well-developed, well-nourished, 70 year old female who is  slightly demented and a poor historian.  HEENT:  Normocephalic, atraumatic.  Pupils are equal, round, and reacted to  light.  NECK:  Supple.  No carotid bruits noted.  CHEST:  Equal breath sounds bilaterally and positive crackles in bilateral  lung bases.  CARDIOVASCULAR:  Heart regular rate and rhythm.  No murmurs, rubs, or  gallops.  ABDOMEN:  Soft,  nontender, nondistended.  Positive bowel sounds.  EXTREMITIES:  Right lower extremity was externally rotated and shortened.  NEUROLOGIC:  The patient is neurovascularly intact distally and has good  pulses.  SKIN:  No rashes or lesions.   X-ray reveals intertrochanteric fracture of the right hip with a  subtrochanteric extension.   IMPRESSION:  1. Intertrochanteric fracture of the right hip with subtrochanteric     extension.  2. Gastroesophageal reflux disease.  3. Hypercholesterolemia.  4. Hypertension.  5. Depression.  6. Chronic obstructive pulmonary disease.  7. History of seizures.  8. History of ovarian cancer.  9. History of cerebrovascular accident.   PLAN:  The patient will be admitted to [facility] for an IM rod to  the right hip by Dr. Durene Romans.      Clarene Reamer, P.A.-C.                   Madlyn Frankel Charlann Boxer, M.D.    SW/MEDQ  D:  05/26/2003  T:  05/26/2003  Job:  [id number]

## 2010-12-13 NOTE — H&P (Signed)
Pamela Hampton, Pamela Hampton               ACCOUNT NO.:  [id number]   MEDICAL RECORD NO.:  [id number]           PATIENT TYPE:   LOCATION:                                 FACILITY:   PHYSICIAN:  Madlyn Frankel. Charlann Boxer, M.D.  DATE OF BIRTH:  12-13-1940   DATE OF ADMISSION:  DATE OF DISCHARGE:                                HISTORY & PHYSICAL   DATE OF ADMISSION TO [facility]:  Dec 12, 2005   CHIEF COMPLAINT:  Pain in my right hip.   PRESENT ILLNESS:  This 70 year old lady has undergone an open reduction  internal fixation for intertrochanteric femoral fracture with an  intermedullary device in the not-too-distant past.  She has now developed an  osteoporotic insufficiency fracture with AVN of her right hip.  The  compression screw has backed out to the point where it is quite prominent  under the skin and she really can not even sleep on that side.  The lag  screw has penetrated the femoral head on x-ray.  After much discussion  including the risks and benefits of surgery and the fact this lady is having  nearly constant pain and finds her whole life affected by this problem, that  she would benefit with surgical intervention and being admitted for removal  of all the hardware and revision of current status to total hip replacement  arthroplasty of the right hip.   PAST MEDICAL HISTORY:  This patient is currently under the presurgical  workup and approval by Dr. Cato Mulligan.  He plans to do pulmonary studies as well  as stress test.  He also has the patient information form which I am told by  the patient he will fill out.  As I understand from the patient, this will  be faxed to [facility].  This, or the patient will bring it with her at the  time of her preoperative visit.   What past medical history I can glean from today:  The patient has COPD,  history of cervical cancer, hypertension, osteoporosis, hypokalemia.  She  also has had a hysterectomy in the past and a splenectomy secondary  to  complications from cholecystectomy.  She is currently taking various  medications for hypertension, osteoporosis and hypokalemia but does not have  that list with her today.  She cannot recall them as well.   SOCIAL HISTORY:  The patient neither smokes and drinks.   ALLERGIES:  No medical allergies.   FAMILY HISTORY:  Noncontributory.   REVIEW OF SYSTEMS:  CNS:  No seizure disorder, paralysis, numbness, or  double vision.  RESPIRATORY:  The patient has dyspnea on exertion, no  orthopnea.  GASTROINTESTINAL:  No nausea, vomiting, melena, bloody stools.  GENITOURINARY:  No discharge, dysuria, hematuria.  MUSCULOSKELETAL:  Primarily in present illness.   PHYSICAL EXAMINATION:  GENERAL:  Alert and cooperative, friendly 70 year old  white female who walks very carefully during examination, has extreme  difficulty coming to a standing position, favoring her right hip.  VITAL SIGNS:  Blood pressure 100/52 seated left arm, pulse 72 regular,  respirations 12.  HEENT:  Normocephalic  PERRLA, EOM intact, oropharynx is clear.  CHEST:  Clear to auscultation.  No rhonchi, no rales, no wheezes.  Breath  sounds are distant.  HEART:  Regular rate and rhythm.  No murmurs are heard.  She has somewhat of  a barrel chest.  ABDOMEN:  Soft, nontender, liver and spleen not felt.  GENITALIA, RECTAL, PELVIC, BREAST:  Not done, not pertinent to present  illness.  EXTREMITIES:  Painful internal and external rotation of the right hip.  She  also has pain with flexion.  Neurovascular is intact to the right lower  extremity.   ADMISSION DIAGNOSES:  1.  Avascular necrosis right hip status post open reduction internal      fixation with an intermedullary device.  2.  Hypertension.  3.  Hypokalemia.   PLAN:  After clearance from Dr. Cato Mulligan for the pulmonary studies as well as  the stress test, the patient will undergo right total hip replacement  arthroplasty.  In all probability she will probably need  skilled nursing  facility after her regular surgery as she lives alone.  After skilled  nursing rehabilitation then perhaps she can go home with Genevieve Norlander helping  with her further rehabilitation.      Dooley L. Cherlynn June.      Madlyn Frankel Charlann Boxer, M.D.  Electronically Signed    DLU/MEDQ  D:  11/27/2005  T:  11/27/2005  Job:  [id number]   cc:   Valetta Mole. Swords, M.D. [facility]  [address] [place]  [place] [address]

## 2010-12-13 NOTE — Consult Note (Signed)
NAME:  Pamela Hampton, Pamela Hampton                         ACCOUNT NO.:  [id number]   MEDICAL RECORD NO.:  [id number]                   PATIENT TYPE:  EMS   LOCATION:  MAJO                                 FACILITY:  [facility]   PHYSICIAN:  Madlyn Frankel. Charlann Boxer, M.D.               DATE OF BIRTH:  January 28, 1941   DATE OF CONSULTATION:  05/23/2003  DATE OF DISCHARGE:                                   CONSULTATION   CHIEF COMPLAINT:  Right hip fracture.   HISTORY OF PRESENT ILLNESS:  [name] is a 70 year old female with known  medical history including COPD, depression, history of smoking, and  questionable seizure disorder as she is on Depakote.  The patient is a  current resident in the [facility] with family living  locally as well as in [place] per report.  She was apparently trying to  go to the restroom from her bed when she fell, sustaining a right hip  fracture. She was brought to the [facility] Emergency Room for evaluation.   Evaluation in the emergency room reveals a female patient who is in no acute  distress.  She is rather slow from a mental standpoint with responses to  questions somewhat delayed.  She does report that she felt dizzy at the time  of her fall.  This will be worked up by Programme researcher, broadcasting/film/video as well as Lexicographer.  She has no other complaints of pain other than the right hip.  Her right hip  is positioned in a shortened and externally rotated position.  She has no  other complaints at this time.   PAST MEDICAL HISTORY:  1. COPD.  2. Depression.  3. Smoking history.  4. She requires 3 liters of oxygen.  5. Probable seizure disorder.  6. Hyperkalemia.  7. Gastroesophageal reflux disease.   PAST SURGICAL HISTORY:  Previous surgical history unknown.   SOCIAL HISTORY:  She is a resident of [facility].   MEDICATIONS:  Include Depakote, Aciphex, Lexapro, Pravachol, and either  fosinopril or lisinopril.   PHYSICAL  EXAMINATION:  GENERAL:  White female in no acute distress.  She is  awake, alert, and cooperative, though somewhat slow responses and apparently  a little confused with question answering, either that or decreased hearing.  EXTREMITIES:  Evaluation of her upper extremities reveals no injury,  deformity or bruising.  She has full motion of her wrists, elbows, and  shoulders.  Evaluation of left lower extremity reveals no bruising or  deformity.  She has no pain with ankle, hip, or knee motion.  Evaluation of  the right hip reveals a shortened and externally rotated extremities with  palpable pulses.  She has pain with any motion, though she has symmetric  pulses bilaterally.  There is some evidence of minor venous pooling in the  lower extremities with discoloration and venous engorgement.   LABORATORY DATA:  Radiographs AP and lateral of the hip reveal a  intratrochanteric fracture with subtrochanteric extension of the right hip.   ASSESSMENT:  Right hip intertrochanteric fracture with subtrochanteric  extension.   PLAN:  The patient will be evaluated by the hospitalists staff on call for  admission secondary to her medical condition including chronic obstructive  pulmonary disease as well as hypokalemia presented on emergency room  evaluation.  She will require surgical fixation of the hip fracture.  This  will need to be discussed with family when they are around.  Attempts at  contacting the family have been initiated and will try to be arranged for  the night of her admission provided she is stable enough for surgical  intervention.  At this time, she will be n.p.o.  Following surgical fixation  of her fracture, she will be weightbearing as tolerated.                                               Madlyn Frankel Charlann Boxer, M.D.    MDO/MEDQ  D:  05/23/2003  T:  05/23/2003  Job:  [id number]

## 2010-12-13 NOTE — Consult Note (Signed)
NAME:  Pamela Hampton, Pamela Hampton                         ACCOUNT NO.:  [id number]   MEDICAL RECORD NO.:  [id number]                   PATIENT TYPE:  INP   LOCATION:  5024                                 FACILITY:  [facility]   PHYSICIAN:  Deanna Artis. Sharene Skeans, M.D.           DATE OF BIRTH:  07/31/1940   DATE OF CONSULTATION:  06/01/2003  DATE OF DISCHARGE:                                   CONSULTATION   CHIEF COMPLAINT:  Altered mental status.   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  She  suffered a right subtrochanteric fracture with intertrochanteric extension  in the right femur.  The patient had a closed reduction, intramedallary  nailing of the femur with screw fixation.  She did not have a total hip  replacement.   The patient in the postoperative period, has had confusional state and has  had significant sundowning.  I had an opportunity to speak to her son who  suggested that there were times that she asked him whether she is being  measured for a burial shroud.  She said other things that were  inappropriate, and clearly result of not having insight into her current  situation.   This seems to have improved with time. Whether this represents increased  sleep, removal of the narcotic analgesics which were being used, or some  other factor is not clear.   PAST MEDICAL HISTORY:  Remarkable for gastroesophageal reflux disease,  hypercholesterolemia, hypertension, depression, chronic obstructive  pulmonary disease from long tobacco abuse, history of seizures which were  not well defined, ovarian cancer and a remote cerebrovascular accident.   PAST SURGICAL HISTORY:  1. Cholecystectomy.  2. Splenectomy.  3. Exploratory laparotomies.  4. Hysterectomy, oophorectomy.   REVIEW OF SYSTEMS:  The patient has not  had fever.  She has not had any  seizures in the hospital.  She has not shown any focal deficits other than  weakness in her right leg which was operated on.  Today she complains of  pain in her right big toe.  Prior to admission, the patient had problems  with irritable bowel syndrome.  She was edentulous and had dentures but did  not wear them as they do not fit well.  History of depression was also  suggested.  A 12-system review was otherwise unremarkable.   CURRENT MEDICATIONS:  1. Lexapro 10 mg per day.  2. Pravachol 40 mg in the evening.  3. Prinivil 5 mg per day.  4. Ventolin 2.5 mg per 3 cc q.6h.  5. Atrovent 0.5 mg or 2.5 cc q.6h.  6. Nicotine patch 21 mg.  7. Aspirin 81 mg.  8. Protonix 40 mg per day.  9. Colace 100 mg twice a day.  10.  Trinsicon one caplet three times a day.  11.      Lovenox 30 mg twice daily.  12.      Depakote 500 mg ER, one per day.  13.      P.r.n. medications included protective barrier to her skin, Lasix,     Ativan, Robaxin, and docusate.  The patient has been taken off of     morphine.   ALLERGIES:  PENICILLIN.   FAMILY HISTORY:  Unremarkable for this particular problem.  Mother had  hypertension and myocardial infarction.  Father had cancer of the lung.   SOCIAL HISTORY:  The patient is a resident of [facility].  I know the plans for now are to send her to [facility] and then possibly then back to [facility].  Her son has been  helping her with finances, but he says that she is independent and signs her  checks.  They just send them by mail to creditors.  He does not think that  she has had any significant problems with her memory over time and was  surprised and upset by the mental status changes that he noted in the  hospital in her.  For that reason, I was asked to see her.   PHYSICAL EXAMINATION:  VITAL SIGNS:  Temperature 97.3, pulse 76,  respirations 20, blood pressure 137/75,  pulse oximetry 95% on three liters.  HEENT:  No signs of infection, no bruits.  NECK:  Supple.  Slight decreased range of motion.  LUNGS:  Clear.  Distant breath sounds.  Barrel chest.  HEART:  No murmurs.  Pulses are normal.  ABDOMEN:  Soft, bowel sounds are normal.  No hepatosplenomegaly.  EXTREMITIES:  Painful right hip.  Tender right toe.  This made examination  of the right leg difficult.  NEUROLOGIC:  Mini-mental status examination 20 with 3 off for her  intermediate memory, 3 off for orientation, day, date and season, 3 off for  being unable to spell the word world backwards beyond DL.  She could not do  any calculations.  She was not able to tell me how many nickels were in a  dollar and a quarter.  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  Cranial nerves, left  eye amblyopia.  Extraocular movements are full but not conjugate.  Visual  fields are full.  Symmetric facial strength, edentulous.  Air conduction  greater than bone conduction bilaterally.  Motor examination, upper  extremities 5/5 without drift.  Fine motor movements were acceptable.  Left  lower extremity 5/5.  The right lower extremity could not be tested due to  pain.  She has an essential tremor in her upper extremities.  Sensation,  peripheral stocking neuropathy.  Good stereognosis and cerebellar  examination.  Good finger to nose. Gait can not be tested.  Deep tendon  reflexes are diminished.  The patient had bilateral flexor plantar  responses.   IMPRESSION:  1. Delirium.  239.0.  This is multifactorial.  It may be related to sleep     deprivation or medications.  2. Organic brain syndrome.  294.9.  I can not be certain how pervasive this     is.  The patient is having difficulty with calculations, but I think she     would be best served by reassessment of her mental status when she has    recovered.  If she continues to have mini mental status  in the 20 to 25     range, treatment with Aricept would be appropriate.  3. Remote infarction, left external capsule.  This is asymptomatic.  4. Remote history of seizures.   PLANS:  1. We will obtain labs for treatable causes of dementia and also uric acid.     The patient's valproic acid level is 32.  This does not need to be     checked.  It is subtherapeutic based on the population.  Since she has     not had any seizures, I would not try to make any changes at this time.     If she remained in delirium, an ammonia level would be appropriate, but     since it has been proved, it is not necessary.  2. She needs and EEG to screen for seizures and also look at the background     for assistance with her underlying encephalopathy.  3. We will review the CT scan of the brain.  4. I would use Haldol 1-2 mg and Risperdal 0.5 to 1 mg at bedtime for     sundowning behavior rather than Ativan.  5. Finally, I would hold her discharge until we can complete this workup.  I     appreciate the opportunity to participate in her care.  If you have     questions, do not hesitate to contact me.                                               Deanna Artis. Sharene Skeans, M.D.    [facility]  D:  06/01/2003  T:  06/01/2003  Job:  [id number]   cc:   Madlyn Frankel Charlann Boxer, M.D.  Fax: [phone]   Janae Bridgeman. Eloise Harman., M.D.  [address]  [place]  [place] [address]  Fax: [phone]

## 2010-12-13 NOTE — Discharge Summary (Signed)
NAMEBEVELY, HACKBART               ACCOUNT NO.:  [id number]   MEDICAL RECORD NO.:  [id number]          PATIENT TYPE:  INP   LOCATION:  1618                         FACILITY:  [facility]   PHYSICIAN:  Madlyn Frankel. Charlann Boxer, M.D.  DATE OF BIRTH:  Feb 01, 1941   DATE OF ADMISSION:  12/12/2005  DATE OF DISCHARGE:                                 DISCHARGE SUMMARY   ADMISSION DIAGNOSES:  1.  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  2.  Hypertension.  3.  Hypokalemia.   DISCHARGE DIAGNOSES:  1.  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  2.  Hypertension.  3.  Hypokalemia.  4.  Postoperative anemia treated with 9 units packed red blood cells.  5.  Postoperative confusion secondary to analgesics.  6.  Hyperkalemia (resolved).  7.  Chronic obstructive pulmonary disease.  8.  Hypovolemia (resolved).   OPERATION:  On Dec 12, 2005, the patient underwent inversion of previous  right hip surgery to right total hip replacement arthroplasty with  complication of intraoperative distal femoral fracture with removal of the  previously placed long gamma nail.  Avel Peace, P.A.-C. assisted.   CONSULTS:  Dr. Sherene Sires, pulmonary medicine.   BRIEF HISTORY:  This 70 year old lady underwent open reduction internal  fixation with gamma nail for an intertrochanteric femoral fracture about 18  months prior to this surgery.  She underwent considerable compression  through the lag screw of the gamma nail with lateral hip pain.  X-rays had  shown femoral head avascular necrosis with femoral head collapse.  After Dr.  Charlann Boxer discussed with the patient the risks and benefits of surgery, it was  decided she would benefit with the above procedure and was admitted for  same.   COURSE IN THE HOSPITAL:  The patient tolerated the surgical procedure quite  well but had some postoperative  complications secondary to cumulative  analgesics.  She is very confused.  Haldol had to be used for calming  effect.  Dr. Sherene Sires was called as the specialist for pulmonary as well as  critical care unit physician.  We fully evaluated the patient, started her  on some pulmonary toilet.  Hypovolemia was taken care of as well as her  acute postoperative hemorrhagic anemia with transfusions and careful  monitoring of I&Os as well as hematocrit and hemoglobin.   The patient had a history of COPD so Dr. Sherene Sires had her continuation with  oxygen which was used intermittently.  She had a mild spike in hyperkalemia.  This was checked several times and returned to normal.   The patient was placed on postoperative Coumadin for the prevention of DVT  as well as pulsed calf stockings as well as TED stockings.  Eventually, the  patient was transferred to the regular floor, participated in physical  therapy.  She was ambulating in her bedroom to the bathroom.  Bedside  commode was used early on and she was able to do that.  We did have to  restrict the weightbearing to the right lower extremity to touchdown  weightbearing only due to the fragility of the femoral fracture encountered  in surgery as mentioned above (please see operative report).   The patient had some tape blisters postoperatively on the thigh.  This was  covered with Tegaderm after verbal consult with skin care team.   We also had a nutritional consult who recommended diet of choice with  supplement feedings.   Dr. Sherene Sires felt the patient was stable.  We did as well.  It was thought that  she might go home with home health.  However, due to her very slow progress  with her physical therapy and with the lack of confidence with the right  lower extremity and the fact that she still had to have touchdown  weightbearing, it was felt that skilled nursing facility with the  rehabilitation service would be indicated.  We therefore contacted the   discharge planners and they worked diligently with the patient, patient's  family, as well as all concerned for skilled nursing facility.  The patient  was in agreement with that.   She had a moderate amount of swelling to the right thigh, a moderate amount  of erythema as well.  A large dissection had to be done for the repair of  the femur.  A moderate amount of serous drainage was noted from the proximal  wound.  Tegaderm was on the blisters.  Neurovascular is intact in the  operative extremity.  Urinalysis was pending at the time of this dictation.  I have ordered to have the results of the urinalysis sent to the nursing  facility.  We will also have a two-view chest x-ray which was recommended by  Dr. Kennith Center, radiologist, for an ill-defined right paratracheal density  probably related to vascular ectasia.  Those results, too, need to be sent  to the nursing home facility.   Dr. Birdie Sons is this patient's internal medicine physician and any  medical concerns, particularly as listed above, should be directed towards  him or the medical physician at the skilled nursing facility.   Laboratory values in the hospital showed that the C. difficile toxin was  negative (this was done for continuing diarrhea).  CBC preoperatively showed  a hemoglobin of 11.6, hematocrit was 35.5.  White count was 6.7.  Final  hematocrit and hemoglobin showed white count of 11.5, hemoglobin was 10.3,  hematocrit was 30.9.  For the transfusions which were done intraoperatively  as well as postoperatively, was done for a hemoglobin of 8.5 and then for  one when it dropped again to 8.4.  Blood chemistry showed a normal routine  chemistry preoperatively.  She did have some hyponatremia, dropping to 133,  134, 131 respectively; final was 135.  Her potassium dropped to 2.6 at one  point, then it came up to 5.3, and eventually settled in with a 3.9. Cortisol levels were 11.5.  Preoperative urinalysis was  negative.  One done  on Dec 12, 2005, showed a trace of hemoglobin and some hyaline casts (this  is repeated and results to be sent to nursing home facility).  Blood  cultures that were done showed no growth x5 days.  Urine culture showed no  growth.  A chest x-ray preoperatively showed hyperinflation without focal  consolidation or edema and a hiatal hernia.  Postoperative x-rays showed  satisfactory appearance of right total hip replacement/total hip conversion.  When repeated, it was same.  Chest x-ray done on  Dec 15, 2005, showed no  focal infiltrate or pulmonary edema but recommended again as mentioned above  by Dr. Molli Posey, followup two-view x-ray recommended for further evaluate  right paratracheal density and this was ordered with results to be sent to  the nursing care facility and Dr. Sherene Sires.  Electrocardiogram from Dec 12, 2005, showed normal ECG; that was done in Dr. Cato Mulligan' office.   CONDITION ON DISCHARGE:  Improved, stable.   PLAN:  The patient is transferred to skilled nursing facility with a  rehabilitation program so that she can continue with her postoperative  rehabilitation.  Due to the nature of the intraoperative fracture at the  removal of the gamma nail and cerclage wires of the right femur, as well as  large dissection, she will be touchdown weightbearing for at least 6-8 weeks  after the date of surgery.   Staples may be removed at 2 to 2-and-a-half weeks after surgery and Steri-  Strips applied.   DVT/PE prophylaxis needs to be continued for at least 4 weeks after the date  of surgery utilizing Coumadin protocol to keep the INR  at 2.0.  Continue  with TED hose as well until the patient is fully ambulated, which will  probably be several months.  Skin care is imperative.   Dry dressing may be used to the right hip on an as-needed basis.  Tegaderm  to be used over the tape blister sites until healed.   Follow up with Dr. Charlann Boxer at [phone] at 2 to  2-and-a-half weeks after the  date of surgery.   Any medical questions should be directed towards Dr. Birdie Sons for his  medical consideration and expertise.   Medication at discharge, and can be continued at Dr. Cato Mulligan or the medical  physician at the skilled nursing facility.  She is on:  1.  Ferrous sulfate 325 mg one b.i.d.  2.  Evista 60 mg tablet daily.  3.  Lexapro 10 mg tablet daily.  4.  Desyrel 100 mg tablet q.h.s.  5.  Protonix 10 mg EC tablet b.i.d. w.c.  6.  Spiriva Aerolizer 18 mcg capsule daily.  7.  Multivitamin daily.  8.  Hydrocodone p.r.n. discomfort.  9.  Robaxin 500 mg p.r.n. muscle spasm.  10. Maalox p.r.n.  11. Imodium 2 mg capsule b.i.d. p.r.n. diarrhea.      Dooley L. Cherlynn June.      Madlyn Frankel Charlann Boxer, M.D.  Electronically Signed    DLU/MEDQ  D:  12/24/2005  T:  12/24/2005  Job:  [id number]   cc:   Valetta Mole. Swords, M.D. [facility]  [address] [place]  [place] [address]  Charlaine Dalton. Sherene Sires, M.D. [facility]  [address]  [place]  [place] [address]

## 2010-12-13 NOTE — Op Note (Signed)
NAME:  Pamela Hampton, Pamela Hampton                         ACCOUNT NO.:  [id number]   MEDICAL RECORD NO.:  [id number]                   PATIENT TYPE:  INP   LOCATION:  5524                                 FACILITY:  [facility]   PHYSICIAN:  Madlyn Frankel. Charlann Boxer, M.D.               DATE OF BIRTH:  Feb 19, 1941   DATE OF PROCEDURE:  05/25/2003  DATE OF DISCHARGE:                                 OPERATIVE REPORT   PREOPERATIVE DIAGNOSIS:  Right intertrochanteric with subtrochanteric femur  fracture.   POSTOPERATIVE DIAGNOSIS:  Right intertrochanteric with subtrochanteric femur  fracture.   PROCEDURE:  Closed reduction and intramedullary nailing of right femur with  a Halmatica Gamma nail 11 x 360 mm with two distal screw fixation and a 95  mm lag screw.   SURGEON:  Madlyn Frankel. Charlann Boxer, M.D.   ASSISTANT:  Clarene Reamer, P.A.-C.   ANESTHESIA:  General.   ESTIMATED BLOOD LOSS:  100 mL.   IV FLUIDS:  600 mL.   DRAINS:  None.   COMPLICATIONS:  None apparent.   INDICATIONS FOR PROCEDURE:  The patient is a pleasant 70 year old white  female with a significant medical history including seizure disorder, who  fell while at home.  She was brought to the [facility] emergency  room where evaluation with radiographs and clinical examination revealed  shortened and externally rotated extremity with pain with movement with  radiographs revealing a subtrochanteric fracture with intertrochanteric  extension of the right femur.  After discussing the risks and benefits with  her and her son, her son, who is her guardian consented for surgical  intervention.   DESCRIPTION OF PROCEDURE:  The patient was brought to the operating room.  Once adequate anesthesia and preoperative antibiotics were administered, the  patient was positioned supine on the fracture table.  The right lower  extremity was placed into the fracture boot for traction.  The left lower  extremity was then flexed, abducted, and externally  rotated in position for  fluoroscopy.  Fracture was reduced under fluoroscopic guidance.  Once the  fracture was reduced, intramedullary nailing was carried out per protocol  without complications.  Reaming was carried out to a 12.5 reamer.  Distal  measuring was to a 360 mm nail.  An 11 x 360 mm Gamma nail was then inserted  without difficulty.  The fracture was nicely reduced on the fluoroscopy.  At  this point, the proximal lag screw was placed per protocol without  difficulty. It was measured to be a 95 mm screw.  We used 125 mm Gamma nail  and the screw was placed in the center in the lateral view and the inferior  center portion of the AP view.  Following this, the nail was locked in this  position until it would not allow for any slide as this had  intertrochanteric extension.  At this point, attention was directed to the  two  distal interlocking screws which were performed under appropriate  surgical technique with fluoroscopy.  The proximal screws were 40 and the  distal screws were 45.  At this point, all of the wounds were irrigated.  The deep fascia was reapproximated with a 0 Vicryl proximally.  The  subcutaneous layers with 2-0 Vicryl and the skin was reapproximated using  staples.  The patient tolerated the procedure without complications and was  extubated.  Her wounds were clean, dry, and dressed sterilely with Adaptic  dressing, sponges, and tape.                                               Madlyn Frankel Charlann Boxer, M.D.    MDO/MEDQ  D:  05/25/2003  T:  05/26/2003  Job:  [id number]

## 2010-12-13 NOTE — Discharge Summary (Signed)
   NAME:  Pamela Hampton, Pamela Hampton                         ACCOUNT NO.:  [id number]   MEDICAL RECORD NO.:  [id number]                   PATIENT TYPE:  INP   LOCATION:  5024                                 FACILITY:  [facility]   PHYSICIAN:  Madlyn Frankel. Charlann Boxer, M.D.               DATE OF BIRTH:  13-Nov-1940   DATE OF ADMISSION:  05/23/2003  DATE OF DISCHARGE:  06/02/2003                                 DISCHARGE SUMMARY   ADDENDUM:  Could you please add to the consults Dr. Ellison Carwin,  neurology. 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   FURTHER HOSPITAL COURSE:  The patient was delayed discharge on May 31, 2003 due to increased bout of confusion for which we felt there needed to be  an evaluation of. We subsequently consulted neurology for this. 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 Otherwise, he felt that this was  stable and that the patient would be able to be discharged on June 02, 2003. No further tests are warranted at this time for the confusion. She is  alert and oriented with no problems, and she is ready to be discharged to  [facility] nursing facility on this date.   DISPOSITION:  The patient will be discharged today to [facility] nursing  facility on June 02, 2003.   FOLLOW UP:  Followup remains the same. She is to followup with Dr. Charlann Boxer two  weeks from the date of surgery. She is to be set up with an appointment in  the office, number [phone].   CONDITION ON DISCHARGE:  Her condition on discharge remains stable and  improved.      Clarene Reamer, P.A.-C.                   Madlyn Frankel Charlann Boxer, M.D.    SW/MEDQ  D:  06/02/2003  T:  06/02/2003  Job:  [id number]

## 2010-12-30 ENCOUNTER — Encounter: Payer: Self-pay | Admitting: Internal Medicine

## 2011-01-02 ENCOUNTER — Ambulatory Visit: Payer: Medicare Other | Admitting: Internal Medicine

## 2011-01-31 ENCOUNTER — Encounter: Payer: Self-pay | Admitting: Internal Medicine

## 2011-01-31 ENCOUNTER — Ambulatory Visit (INDEPENDENT_AMBULATORY_CARE_PROVIDER_SITE_OTHER): Payer: Medicare Other | Admitting: Internal Medicine

## 2011-01-31 VITALS — BP 100/60 | Temp 98.3°F

## 2011-01-31 DIAGNOSIS — R350 Frequency of micturition: Secondary | ICD-10-CM

## 2011-01-31 DIAGNOSIS — E538 Deficiency of other specified B group vitamins: Secondary | ICD-10-CM

## 2011-01-31 DIAGNOSIS — J449 Chronic obstructive pulmonary disease, unspecified: Secondary | ICD-10-CM

## 2011-01-31 DIAGNOSIS — I10 Essential (primary) hypertension: Secondary | ICD-10-CM

## 2011-01-31 LAB — POCT URINALYSIS DIPSTICK
Spec Grav, UA: 1.03
Urobilinogen, UA: 1

## 2011-01-31 MED ORDER — CIPROFLOXACIN HCL 500 MG PO TABS: 500.0000 mg | ORAL_TABLET | Freq: Two times a day (BID) | ORAL | Status: AC

## 2011-01-31 NOTE — Progress Notes (Signed)
  Subjective:    Patient ID: Pamela Hampton, female    DOB: 1940-08-15, 70 y.o.   MRN: [id number]  HPI  70 year old patient who has a history of advanced COPD oxygen dependent as well as hypertension. She has had a long history of recurrent UTIs. The past several days she has had increasing urinary frequency dysuria and urgency. No fever chills or flank pain. No constitutional symptoms.   Review of Systems  Constitutional: Negative.   HENT: Negative for hearing loss, congestion, sore throat, rhinorrhea, dental problem, sinus pressure and tinnitus.   Eyes: Negative for pain, discharge and visual disturbance.  Respiratory: Positive for shortness of breath. Negative for cough.   Cardiovascular: Negative for chest pain, palpitations and leg swelling.  Gastrointestinal: Negative for nausea, vomiting, abdominal pain, diarrhea, constipation, blood in stool and abdominal distention.  Genitourinary: Positive for dysuria, urgency and frequency. Negative for hematuria, flank pain, vaginal bleeding, vaginal discharge, difficulty urinating, vaginal pain and pelvic pain.  Musculoskeletal: Negative for joint swelling, arthralgias and gait problem.  Skin: Negative for rash.  Neurological: Negative for dizziness, syncope, speech difficulty, weakness, numbness and headaches.  Hematological: Negative for adenopathy.  Psychiatric/Behavioral: Negative for behavioral problems, dysphoric mood and agitation. The patient is not nervous/anxious.        Objective:   Physical Exam  Constitutional: She is oriented to person, place, and time. She appears well-developed and well-nourished.  HENT:  Head: Normocephalic.  Right Ear: External ear normal.  Left Ear: External ear normal.  Mouth/Throat: Oropharynx is clear and moist.  Eyes: Conjunctivae and EOM are normal. Pupils are equal, round, and reactive to light.  Neck: Normal range of motion. Neck supple. No thyromegaly present.  Cardiovascular: Normal rate,  regular rhythm, normal heart sounds and intact distal pulses.   Pulmonary/Chest: Effort normal and breath sounds normal.       O2 saturation at rest 87% Place on supplemental O2 up to 94%. Pulse rate 88  Abdominal: Soft. Bowel sounds are normal. She exhibits no mass. There is no tenderness.  Musculoskeletal: Normal range of motion.  Lymphadenopathy:    She has no cervical adenopathy.  Neurological: She is alert and oriented to person, place, and time.  Skin: Skin is warm and dry. No rash noted.  Psychiatric: She has a normal mood and affect. Her behavior is normal.          Assessment & Plan:   UTI. Will treat with Cipro for 7 days Oxygen-dependent COPD. Compliance stressed Hypertension well controlled

## 2011-01-31 NOTE — Patient Instructions (Signed)
Limit your sodium (Salt) intake  Use oxygen therapy at all times  Call or return to clinic prn if these symptoms worsen or fail to improve as anticipated.

## 2011-02-06 ENCOUNTER — Ambulatory Visit: Payer: Medicare Other | Admitting: Internal Medicine

## 2011-03-19 ENCOUNTER — Other Ambulatory Visit: Payer: Self-pay | Admitting: *Deleted

## 2011-03-19 MED ORDER — LISINOPRIL-HYDROCHLOROTHIAZIDE 20-25 MG PO TABS: 1.0000 | ORAL_TABLET | Freq: Every day | ORAL | Status: DC

## 2011-04-03 ENCOUNTER — Ambulatory Visit (INDEPENDENT_AMBULATORY_CARE_PROVIDER_SITE_OTHER): Payer: Medicare Other | Admitting: Internal Medicine

## 2011-04-03 DIAGNOSIS — D509 Iron deficiency anemia, unspecified: Secondary | ICD-10-CM

## 2011-04-03 DIAGNOSIS — E538 Deficiency of other specified B group vitamins: Secondary | ICD-10-CM

## 2011-04-30 ENCOUNTER — Ambulatory Visit: Payer: Medicare Other | Admitting: Internal Medicine

## 2011-05-07 ENCOUNTER — Other Ambulatory Visit: Payer: Self-pay | Admitting: *Deleted

## 2011-05-07 MED ORDER — LISINOPRIL-HYDROCHLOROTHIAZIDE 20-25 MG PO TABS: 1.0000 | ORAL_TABLET | Freq: Every day | ORAL | Status: DC

## 2011-05-07 MED ORDER — FLUTICASONE PROPIONATE 50 MCG/ACT NA SUSP: 2.0000 | Freq: Every day | NASAL | Status: DC

## 2011-05-09 ENCOUNTER — Ambulatory Visit: Payer: Medicare Other | Admitting: Internal Medicine

## 2011-05-09 ENCOUNTER — Encounter: Payer: Self-pay | Admitting: Internal Medicine

## 2011-05-09 ENCOUNTER — Ambulatory Visit (INDEPENDENT_AMBULATORY_CARE_PROVIDER_SITE_OTHER): Payer: Medicare Other | Admitting: Internal Medicine

## 2011-05-09 DIAGNOSIS — J449 Chronic obstructive pulmonary disease, unspecified: Secondary | ICD-10-CM

## 2011-05-09 DIAGNOSIS — K219 Gastro-esophageal reflux disease without esophagitis: Secondary | ICD-10-CM

## 2011-05-09 DIAGNOSIS — E785 Hyperlipidemia, unspecified: Secondary | ICD-10-CM

## 2011-05-09 DIAGNOSIS — I10 Essential (primary) hypertension: Secondary | ICD-10-CM

## 2011-05-09 MED ORDER — BUDESONIDE-FORMOTEROL FUMARATE 160-4.5 MCG/ACT IN AERO: 2.0000 | INHALATION_SPRAY | Freq: Two times a day (BID) | RESPIRATORY_TRACT | Status: DC

## 2011-05-09 NOTE — Progress Notes (Signed)
  Subjective:    Patient ID: Pamela Hampton, female    DOB: [birth year], 70 y.o.   MRN: [id number]  HPI  70 year old patient who is seen today for followup. She has a history of advanced COPD she is requesting a pulmonary referral. Pulmonary medications include albuterol only as she takes on the average of only twice daily. It is fairly high also his requirements at 4 L per minute. O2 saturation was only 80% after ambulating to the examining room. Denies any active pulmonary complaints. She has had no increase in cough but does have intermittent wheezing she has occasional ankle edema but not chronically. Stable medical problems include dyslipidemia and gastroesophageal reflux disease with Barrett's esophagus    Review of Systems  Constitutional: Positive for fatigue.  HENT: Negative for hearing loss, congestion, sore throat, rhinorrhea, dental problem, sinus pressure and tinnitus.   Eyes: Negative for pain, discharge and visual disturbance.  Respiratory: Positive for shortness of breath and wheezing. Negative for cough.   Cardiovascular: Negative for chest pain, palpitations and leg swelling.  Gastrointestinal: Negative for nausea, vomiting, abdominal pain, diarrhea, constipation, blood in stool and abdominal distention.  Genitourinary: Negative for dysuria, urgency, frequency, hematuria, flank pain, vaginal bleeding, vaginal discharge, difficulty urinating, vaginal pain and pelvic pain.  Musculoskeletal: Negative for joint swelling, arthralgias and gait problem.  Skin: Negative for rash.  Neurological: Negative for dizziness, syncope, speech difficulty, weakness, numbness and headaches.  Hematological: Negative for adenopathy.  Psychiatric/Behavioral: Negative for behavioral problems, dysphoric mood and agitation. The patient is not nervous/anxious.        Objective:   Physical Exam  Constitutional: She is oriented to person, place, and time. She appears well-developed and well-nourished.  No distress.       Overweight Nasal oxygen in place Blood pressure 126/80  HENT:  Head: Normocephalic.  Right Ear: External ear normal.  Left Ear: External ear normal.  Mouth/Throat: Oropharynx is clear and moist.  Eyes: Conjunctivae and EOM are normal. Pupils are equal, round, and reactive to light.  Neck: Normal range of motion. Neck supple. No thyromegaly present.  Cardiovascular: Normal rate, regular rhythm, normal heart sounds and intact distal pulses.   Pulmonary/Chest: Effort normal.       Breath sounds are diminished with a prolonged expiratory phase A few rhonchi noted on the right No active wheezing  O2 saturation at rest 88% on 4 L of oxygen  Abdominal: Soft. Bowel sounds are normal. She exhibits no mass. There is no tenderness.  Musculoskeletal: Normal range of motion. She exhibits no edema.  Lymphadenopathy:    She has no cervical adenopathy.  Neurological: She is alert and oriented to person, place, and time.  Skin: Skin is warm and dry. No rash noted.  Psychiatric: She has a normal mood and affect. Her behavior is normal.          Assessment & Plan:   Advanced oxygen-dependent COPD. We'll set up for pulmonary referral will place on Symbicort twice daily and ask the patient to take albuterol every 6 hours. Pulmonary will consider nebulizer treatments and/or Spiriva Gastroesophageal reflux disease. Will continue omeprazole Dyslipidemia. We'll continue Crestor 20 mg daily  Flu vaccine will be encouraged

## 2011-05-09 NOTE — Patient Instructions (Signed)
Limit your sodium (Salt) intake  Pulmonary referral as discussed  Use albuterol every 6 hours  Return in 4 months for follow-up

## 2011-05-13 LAB — CBC
HCT: 36.7
Platelets: 322
RBC: 4.06
WBC: 9.8

## 2011-05-13 LAB — DIFFERENTIAL
Eosinophils Relative: 2
Lymphocytes Relative: 32
Lymphs Abs: 3.1
Monocytes Relative: 6

## 2011-05-13 LAB — PROTIME-INR
INR: 0.9
Prothrombin Time: 12.6

## 2011-05-29 ENCOUNTER — Encounter: Payer: Self-pay | Admitting: Internal Medicine

## 2011-05-29 ENCOUNTER — Ambulatory Visit (INDEPENDENT_AMBULATORY_CARE_PROVIDER_SITE_OTHER): Payer: Medicare Other | Admitting: Internal Medicine

## 2011-05-29 VITALS — BP 120/72 | HR 80 | Temp 98.8°F | Ht 64.0 in | Wt 186.4 lb

## 2011-05-29 DIAGNOSIS — R0609 Other forms of dyspnea: Secondary | ICD-10-CM

## 2011-05-29 DIAGNOSIS — J449 Chronic obstructive pulmonary disease, unspecified: Secondary | ICD-10-CM

## 2011-05-29 DIAGNOSIS — R0600 Dyspnea, unspecified: Secondary | ICD-10-CM

## 2011-05-29 NOTE — Progress Notes (Signed)
Subjective:    Patient ID: Pamela Hampton, female    DOB: [birth year], 70 y.o.   MRN: [id number]  HPI  IOV 05/29/11: 70 year old female. 60 pack smoker quit 2004. Frequent UTI. 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 > 30# weight gain since past 2 years  Carries diagnosis of copd. Seen by Dr Lesia Hausen 05/09/11 and referred here for copd with hypoxemia needing 4L O2. Patient main complaint is dyspnea. Dyspnea onset was in 2002 and diagnosed with copd at that time. Initially started on nocutrnal o2 at that time. This is chronic, insidious onset, and progressive past 5 months. Since 5 months using o2 all the time. Rates dyspnea as severe and sometimes associated with panic. Denies associated cough other than very mild occassional cough and mild white sputum. Has associated mild pedal edema on and off past few months. Last spirometry 2007 per hx: details not known. CT scan sinus Nov 2011: normal. CXR may 2008 - clear but large hiatal hernia +  Reports associated chest pain - describes it as ache in back of sternum for past 3-4 months. Insidious onset. Has gotten better past few months. More at rest. Less with exertion. Denies known associated CAD. Recollects chemical cardiac stress test 3 years ago which she thinks was normal  Noted she is on ace inhibitor.    Review of Systems  Constitutional: Positive for unexpected weight change. Negative for fever.  HENT: Positive for nosebleeds, congestion, rhinorrhea, sneezing, postnasal drip and sinus pressure. Negative for sore throat, trouble swallowing and dental problem.   Eyes: Negative for redness and itching.  Respiratory: Positive for cough, chest tightness, shortness of breath and wheezing.   Cardiovascular: Negative for palpitations and leg swelling.  Gastrointestinal: Negative for nausea and vomiting.  Genitourinary: Negative for dysuria.  Musculoskeletal: Positive for joint swelling.  Skin: Negative for rash.  Neurological: Positive for headaches.    Hematological: Bruises/bleeds easily.  Psychiatric/Behavioral: Positive for dysphoric mood. The patient is not nervous/anxious.        Objective:   Physical Exam  Vitals reviewed. Constitutional: She is oriented to person, place, and time. She appears well-developed and well-nourished. No distress.       Obese Looks chronically unwell  HENT:  Head: Normocephalic and atraumatic.  Right Ear: External ear normal.  Left Ear: External ear normal.  Mouth/Throat: Oropharynx is clear and moist. No oropharyngeal exudate.  Eyes: Conjunctivae and EOM are normal. Pupils are equal, round, and reactive to light. Right eye exhibits no discharge. Left eye exhibits no discharge. No scleral icterus.  Neck: Normal range of motion. Neck supple. No JVD present. No tracheal deviation present. No thyromegaly present.  Cardiovascular: Normal rate, regular rhythm, normal heart sounds and intact distal pulses.  Exam reveals no gallop and no friction rub.   No murmur heard. Pulmonary/Chest: Effort normal and breath sounds normal. No respiratory distress. She has no wheezes. She has no rales. She exhibits no tenderness.       barrell chest Prolonged expiration No wheeze No resp distress Chest is tight Hyper-resonant percussion  Abdominal: Soft. Bowel sounds are normal. She exhibits no distension and no mass. There is no tenderness. There is no rebound and no guarding.  Musculoskeletal: Normal range of motion. She exhibits no edema and no tenderness.  Lymphadenopathy:    She has no cervical adenopathy.  Neurological: She is alert and oriented to person, place, and time. She has normal reflexes. No cranial nerve deficit. She exhibits normal muscle tone. Coordination normal.  Skin: Skin is warm and dry. No rash noted. She is not diaphoretic. No erythema. No pallor.  Psychiatric: She has a normal mood and affect. Her behavior is normal. Judgment and thought content normal.          Assessment & Plan:

## 2011-05-29 NOTE — Patient Instructions (Addendum)
Please have full PFT breathing test and return Please have CXR PA and lateral at time of followup Please have alpha 1 genetic test for copd at followup Your worsening shortness of breath could be due to copd, weight gain, deconditioning, medication issues, other related causes like heart, ace inhibitor intake After breathing test we can determine how to proceed

## 2011-05-30 ENCOUNTER — Encounter: Payer: Self-pay | Admitting: Internal Medicine

## 2011-05-30 NOTE — Assessment & Plan Note (Deleted)
Please have full PFT breathing test and return Please have CXR PA and lateral at time of followup Please have alpha 1 genetic test for copd at followup Your worsening shortness of breath could be due to copd, weight gain, deconditioning, medication issues, other related causes like heart, ace inhibitor intake After breathing test we can determine how to proceed 

## 2011-05-30 NOTE — Assessment & Plan Note (Signed)
Your worsening shortness of breath could be due to copd, weight gain, deconditioning, medication issues, other related causes like heart, ace inhibitor intake  PLAN Please have full PFT breathing test and return Please have CXR PA and lateral at time of followup Please have alpha 1 genetic test for copd at followup After breathing test we can determine how to proceed

## 2011-06-25 ENCOUNTER — Ambulatory Visit: Payer: Medicare Other | Admitting: Internal Medicine

## 2011-06-26 ENCOUNTER — Encounter: Payer: Self-pay | Admitting: Gastroenterology

## 2011-07-07 ENCOUNTER — Encounter: Payer: Self-pay | Admitting: Internal Medicine

## 2011-07-07 ENCOUNTER — Ambulatory Visit (INDEPENDENT_AMBULATORY_CARE_PROVIDER_SITE_OTHER): Payer: Medicare Other | Admitting: Internal Medicine

## 2011-07-07 VITALS — BP 110/70 | HR 71 | Temp 98.2°F | Ht 64.0 in | Wt 184.0 lb

## 2011-07-07 DIAGNOSIS — R0600 Dyspnea, unspecified: Secondary | ICD-10-CM

## 2011-07-07 DIAGNOSIS — Z23 Encounter for immunization: Secondary | ICD-10-CM

## 2011-07-07 DIAGNOSIS — J4489 Other specified chronic obstructive pulmonary disease: Secondary | ICD-10-CM

## 2011-07-07 DIAGNOSIS — R0609 Other forms of dyspnea: Secondary | ICD-10-CM

## 2011-07-07 DIAGNOSIS — J449 Chronic obstructive pulmonary disease, unspecified: Secondary | ICD-10-CM

## 2011-07-07 LAB — PULMONARY FUNCTION TEST

## 2011-07-07 MED ORDER — LOSARTAN POTASSIUM 25 MG PO TABS: 25.0000 mg | ORAL_TABLET | Freq: Every day | ORAL | Status: DC

## 2011-07-07 MED ORDER — TIOTROPIUM BROMIDE MONOHYDRATE 18 MCG IN CAPS: 18.0000 ug | ORAL_CAPSULE | Freq: Every day | RESPIRATORY_TRACT | Status: DC

## 2011-07-07 NOTE — Patient Instructions (Signed)
You have moderate-severe copd We will change your oxygen to portable system Please continue symbicort Start spiriva - take sample, script and show technique Continue oxygen Will make referral to pulmonary rehab Have pneumonia vaccine AT fu do alpha  1 test STop lisinopril - can make you cough. Take losartan generic 25mg once daily instead; take script Returin in 2 months  

## 2011-07-07 NOTE — Progress Notes (Signed)
PFT done today. 

## 2011-07-07 NOTE — Progress Notes (Signed)
  Subjective:    Patient ID: Pamela Hampton, female    DOB: 1940/12/15, 70 y.o.   MRN: [id number]  HPI OV 05/29/11: 70 year old female. 60 pack smoker quit 2004. Frequent UTI. 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 > 30# weight gain since past 2 years  Carries diagnosis of copd. Seen by Dr Lesia Hausen 05/09/11 and referred here for copd with hypoxemia needing 4L O2. Patient main complaint is dyspnea. Dyspnea onset was in 2002 and diagnosed with copd at that time. Initially started on nocutrnal o2 at that time. This is chronic, insidious onset, and progressive past 5 months. Since 5 months using o2 all the time. Rates dyspnea as severe and sometimes associated with panic. Denies associated cough other than very mild occassional cough and mild white sputum. Has associated mild pedal edema on and off past few months. Last spirometry 2007 per hx: details not known. CT scan sinus Nov 2011: normal. CXR may 2008 - clear but large hiatal hernia +  Reports associated chest pain - describes it as ache in back of sternum for past 3-4 months. Insidious onset. Has gotten better past few months. More at rest. Less with exertion. Denies known associated CAD. Recollects chemical cardiac stress test 3 years ago which she thinks was normal  Noted she is on ace inhibitor.   OV 07/07/2011 Followup after PFT. PFTs shows fev1 1.23L/60% with 12%BD response oon FVC. Ratip53, TLC 125%.,  No new symptoms  Review of Systems    Positive for dyspnea, panic, cough, ace inhibitor intake Rest 11 point ROS neative Objective:   Physical Exam  Body mass index is 31.58 kg/(m^2). Obese Looks well On o2 - wants prortable system Expiration prolonged. No distress.. No wheeze. No accessory muscle use. No crackles Normal percussion Normal S1S2+ Abd: obese, soft, no tenderness Ext: no cyanosis, no clubbing, no edema CNS: AxOX3. Movesl all4s. Speech normal      Assessment & Plan:

## 2011-07-08 ENCOUNTER — Encounter: Payer: Self-pay | Admitting: Internal Medicine

## 2011-07-08 NOTE — Assessment & Plan Note (Signed)
You have moderate-severe copd We will change your oxygen to portable system Please continue symbicort Start spiriva - take sample, script and show technique Continue oxygen Will make referral to pulmonary rehab Have pneumonia vaccine AT fu do alpha  1 test STop lisinopril - can make you cough. Take losartan generic 25mg  once daily instead; take script Returin in 2 months

## 2011-07-09 ENCOUNTER — Telehealth: Payer: Self-pay | Admitting: Internal Medicine

## 2011-07-09 NOTE — Telephone Encounter (Signed)
[id number] x1 

## 2011-07-10 NOTE — Telephone Encounter (Signed)
Called and spoke with pt.  Pt states she had PFTs on 07/07/11 and had f/u with MR afterwards to discuss results.  Pt states she know she has COPD but wanted to know the results of the PFT in "detail that she could understand."  MR, please advise. Thanks.

## 2011-07-10 NOTE — Telephone Encounter (Signed)
Clarified results and counseled

## 2011-07-11 ENCOUNTER — Other Ambulatory Visit: Payer: Self-pay | Admitting: *Deleted

## 2011-07-11 MED ORDER — OMEPRAZOLE 20 MG PO CPDR: 20.0000 mg | DELAYED_RELEASE_CAPSULE | Freq: Every day | ORAL | Status: DC

## 2011-08-04 ENCOUNTER — Other Ambulatory Visit: Payer: Self-pay | Admitting: *Deleted

## 2011-08-04 MED ORDER — ESCITALOPRAM OXALATE 10 MG PO TABS: 10.0000 mg | ORAL_TABLET | Freq: Every day | ORAL | Status: DC

## 2011-08-04 MED ORDER — ROSUVASTATIN CALCIUM 20 MG PO TABS: 20.0000 mg | ORAL_TABLET | ORAL | Status: DC

## 2011-08-11 ENCOUNTER — Other Ambulatory Visit (INDEPENDENT_AMBULATORY_CARE_PROVIDER_SITE_OTHER): Payer: Medicare Other

## 2011-08-11 DIAGNOSIS — N390 Urinary tract infection, site not specified: Secondary | ICD-10-CM

## 2011-08-11 LAB — POCT URINALYSIS DIPSTICK
Bilirubin, UA: NEGATIVE
Ketones, UA: NEGATIVE
pH, UA: 6

## 2011-08-13 ENCOUNTER — Telehealth: Payer: Self-pay | Admitting: *Deleted

## 2011-08-13 NOTE — Telephone Encounter (Signed)
See note

## 2011-08-13 NOTE — Telephone Encounter (Signed)
Urine for culture and then she can start taking septra ds bid for 7 days

## 2011-08-13 NOTE — Telephone Encounter (Signed)
Pt had UA on 08/11/2011, and is still having dysuria and frequency.  Would like RX called to pharmacy.

## 2011-08-14 ENCOUNTER — Telehealth: Payer: Self-pay | Admitting: *Deleted

## 2011-08-14 MED ORDER — SULFAMETHOXAZOLE-TRIMETHOPRIM 800-160 MG PO TABS: 1.0000 | ORAL_TABLET | Freq: Two times a day (BID) | ORAL | Status: AC

## 2011-08-14 NOTE — Telephone Encounter (Signed)
Needs Urine culture and meds have been called in.

## 2011-08-14 NOTE — Telephone Encounter (Signed)
Left pt message to come by and leave [place] another UA, and then she can take the Septra.

## 2011-08-14 NOTE — Telephone Encounter (Signed)
Pt need to bring in Urine for Culture.

## 2011-09-04 ENCOUNTER — Other Ambulatory Visit: Payer: Self-pay | Admitting: *Deleted

## 2011-09-05 ENCOUNTER — Other Ambulatory Visit: Payer: Self-pay

## 2011-09-05 NOTE — Telephone Encounter (Signed)
This was dc's 07/07/11 at [facility] appt. - placed on losartan Refill denied - faxed back to [facility]

## 2011-09-09 ENCOUNTER — Encounter: Payer: Self-pay | Admitting: Internal Medicine

## 2011-09-09 ENCOUNTER — Ambulatory Visit (INDEPENDENT_AMBULATORY_CARE_PROVIDER_SITE_OTHER): Payer: Medicare Other | Admitting: Internal Medicine

## 2011-09-09 VITALS — BP 140/80 | HR 79 | Temp 99.1°F | Ht 64.0 in | Wt 189.6 lb

## 2011-09-09 DIAGNOSIS — R0609 Other forms of dyspnea: Secondary | ICD-10-CM

## 2011-09-09 DIAGNOSIS — R0600 Dyspnea, unspecified: Secondary | ICD-10-CM

## 2011-09-09 DIAGNOSIS — Z129 Encounter for screening for malignant neoplasm, site unspecified: Secondary | ICD-10-CM

## 2011-09-09 DIAGNOSIS — I10 Essential (primary) hypertension: Secondary | ICD-10-CM

## 2011-09-09 DIAGNOSIS — J449 Chronic obstructive pulmonary disease, unspecified: Secondary | ICD-10-CM

## 2011-09-09 DIAGNOSIS — R0602 Shortness of breath: Secondary | ICD-10-CM

## 2011-09-09 NOTE — Patient Instructions (Signed)
#  COPD  - You have moderate-severe copd - continue oxygen portable system, symbicort and spiriva - have asked pulmonary rehab why they have not contacted you; once I find out will call you back - will ensure your pneumonia vaccine and flu vaccine status is updated  - today or at followup do alpha  1 test #BP - conitnue losartan generic 25mg  once daily  #Shortness of breath  - will refer you to cardiology given concern that some of your shortness of breath might be due to heart issue #Cancer risk and screening  - will do CT chest without contrast (low radiation dose protocol) for lung cancer screening; might need insurance approval #Followup Returin in 2 months

## 2011-09-09 NOTE — Progress Notes (Signed)
Subjective:    Patient ID: Pamela Hampton, female    DOB: [birth year], 71 y.o.   MRN: [id number]  HPI IOV 05/29/11: 71 year old female. 60 pack smoker quit 2004. Frequent UTI. 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 > 30# weight gain since past 2 years  Carries diagnosis of copd. Seen by Dr Lesia Hausen 05/09/11 and referred here for copd with hypoxemia needing 4L O2. Patient main complaint is dyspnea. Dyspnea onset was in 2002 and diagnosed with copd at that time. Initially started on nocutrnal o2 at that time. This is chronic, insidious onset, and progressive past 5 months. Since 5 months using o2 all the time. Rates dyspnea as severe and sometimes associated with panic. Denies associated cough other than very mild occassional cough and mild white sputum. Has associated mild pedal edema on and off past few months. Last spirometry 2007 per hx: details not known. CT scan sinus Nov 2011: normal. CXR may 2008 - clear but large hiatal hernia +  Reports associated chest pain - describes it as ache in back of sternum for past 3-4 months. Insidious onset. Has gotten better past few months. More at rest. Less with exertion. Denies known associated CAD. Recollects chemical cardiac stress test 3 years ago which she thinks was normal  Noted she is on ace inhibitor.   REC You have moderate-severe copd  We will change your oxygen to portable system  Please continue symbicort  Start spiriva - take sample, script and show technique  Continue oxygen  Will make referral to pulmonary rehab  Have pneumonia vaccine  AT fu do alpha 1 test  STop lisinopril - can make you cough. Take losartan generic 25mg  once daily instead; take script  Returin in 2 months    OV 09/09/2011  FU COPD.   Was advised to continue symbicort and spiriva added at last visit. She is using o2 all the time. She has stopped lisinopril. PFT 07/06/12 showed Gold stage 2 copd. FEv1 1.2L/60% with 8%BD response. But still not feeling any better in terms  of dyspnea. FEels that there is thick mucus inside chest and unable to bring it out. IN terms of chest pain, this is resolved but she is concerned that dyspnea could be due to heart issues She is yet to hear from rehab and very concerned about it  Past, Family, Social reviewed: She is concerned about family hx and is expressing it again - cancer and mI. Mom - died of MI. Father died of stomach cancer. Brother died of throat cancer. Immediate Elder sister died of lung cancer. Baby brother died of brain cancer. Eldest sister recently diagnosed with lung cancer - prognosis of 4-5 months. She is asking about her odds for lung cancer (esp with her past hx of hysterectomy 1978, and had cancer in 1998 in spot of uterus - s/p XRT). Very keen to know this and wants any test possible for it.   Review of Systems  Constitutional: Negative for fever and unexpected weight change.  HENT: Negative for ear pain, nosebleeds, congestion, sore throat, rhinorrhea, sneezing, trouble swallowing, dental problem, postnasal drip and sinus pressure.   Eyes: Negative for redness and itching.  Respiratory: Positive for shortness of breath. Negative for cough, chest tightness and wheezing.   Cardiovascular: Negative for palpitations and leg swelling.  Gastrointestinal: Negative for nausea and vomiting.  Genitourinary: Negative for dysuria.  Musculoskeletal: Negative for joint swelling.  Skin: Negative for rash.  Neurological: Negative for headaches.  Hematological: Does not  bruise/bleed easily.  Psychiatric/Behavioral: Negative for dysphoric mood. The patient is not nervous/anxious.        Objective:   Physical Exam  Counseling visiit Oriented and communicative      Assessment & Plan:

## 2011-09-10 ENCOUNTER — Encounter: Payer: Self-pay | Admitting: Internal Medicine

## 2011-09-10 DIAGNOSIS — Z129 Encounter for screening for malignant neoplasm, site unspecified: Secondary | ICD-10-CM | POA: Insufficient documentation

## 2011-09-10 NOTE — Assessment & Plan Note (Signed)
#  COPD  - You have moderate-severe copd - continue oxygen portable system, symbicort and spiriva - have asked pulmonary rehab why they have not contacted you; once I find out will call you back - will ensure your pneumonia vaccine and flu vaccine status is updated  - today or at followup do alpha  1 test

## 2011-09-10 NOTE — Assessment & Plan Note (Signed)
#  BP - conitnue losartan generic 25mg  once daily

## 2011-09-10 NOTE — Assessment & Plan Note (Signed)
#  Shortness of breath  - will refer you to cardiology given concern that some of your shortness of breath might be due to heart issue especially given family history

## 2011-09-10 NOTE — Assessment & Plan Note (Addendum)
Multiple family members with cancer. She is at high risk for lung cancer due to age, smoking hx, copd hx and family hx. Good evidence in recent literature for role for CT in early lung cancer detection and mortality prevention. Will get Non contrast ct chest. I did warn however about false positives and she is ok with that  > 50% of this > 25 min visit spent in face to face counseling (15 min visit converted to >25 min visit) including components of dyspnea, cancer screening and copd

## 2011-09-15 ENCOUNTER — Ambulatory Visit
Admission: RE | Admit: 2011-09-15 | Payer: Medicare Other | Source: Ambulatory Visit | Attending: Internal Medicine | Admitting: Internal Medicine

## 2011-09-15 ENCOUNTER — Ambulatory Visit (INDEPENDENT_AMBULATORY_CARE_PROVIDER_SITE_OTHER): Payer: Medicare Other | Admitting: Internal Medicine

## 2011-09-15 ENCOUNTER — Encounter: Payer: Self-pay | Admitting: Internal Medicine

## 2011-09-15 VITALS — BP 122/70 | HR 83 | Temp 97.9°F | Wt 189.0 lb

## 2011-09-15 DIAGNOSIS — R350 Frequency of micturition: Secondary | ICD-10-CM

## 2011-09-15 DIAGNOSIS — IMO0001 Reserved for inherently not codable concepts without codable children: Secondary | ICD-10-CM

## 2011-09-15 LAB — POCT URINALYSIS DIPSTICK
Bilirubin, UA: NEGATIVE
Blood, UA: NEGATIVE
Glucose, UA: NEGATIVE
Ketones, UA: NEGATIVE
Spec Grav, UA: 1.025

## 2011-09-15 NOTE — Progress Notes (Signed)
Patient ID: Pamela Hampton, female   DOB: 1941/02/08, 71 y.o.   MRN: [id number] 1 week hx of urniary frequency and urgency.. Some better past 2 days No fever or back pain She has hx of UTI-recurrent i questioned her about GYN cancer and surgery---updated PMH  Past Medical History  Diagnosis Date  . Allergy     Rhinitis  . COPD (chronic obstructive pulmonary disease)   . Depression   . GERD (gastroesophageal reflux disease)     Barrett's esophagus  . Hyperlipidemia   . Hypertension   . Osteoporosis   . Seizures   . Cancer     Ovarian  . Barrett's esophagus 07/19/2004  . Anemia   . Obesity   . Pneumonia   . Stroke   . Sleep apnea   . Hepatitis     History   Social History  . Marital Status: Divorced    Spouse Name: N/A    Number of Children: 4  . Years of Education: N/A   Occupational History  .     Social History Main Topics  . Smoking status: Former Smoker -- 2.0 packs/day for 30 years    Quit date: 07/28/2002  . Smokeless tobacco: Never Used  . Alcohol Use: No  . Drug Use: No  . Sexually Active: Not on file   Other Topics Concern  . Not on file   Social History Narrative  . No narrative on file    Past Surgical History  Procedure Date  . Orif hip fracture   . Cholecystectomy   . Hemorrhoid surgery   . Total hip arthroplasty   . Abdominal hysterectomy   . 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 Heart disease Mother   . Diabetes Mother   . Cancer Father     Stomach  . Diabetes Sister   . Cancer Brother     Esophageal   . Diabetes Son   . Diabetes Son     No Known Allergies  Current Outpatient Prescriptions on File Prior to Visit  Medication Sig Dispense Refill  . albuterol (PROAIR HFA) 108 (90 BASE) MCG/ACT inhaler Inhale 2 puffs into the lungs every 4 (four) hours as needed. For SOB       . budesonide-formoterol (SYMBICORT)  160-4.5 MCG/ACT inhaler Inhale 2 puffs into the lungs 2 (two) times daily.  1 Inhaler  12  . cyanocobalamin (,VITAMIN B-12,) 1000 MCG/ML injection Inject 1,000 mcg into the muscle once. Inject 1 cc every month at Dr Cato Mulligan office       . escitalopram (LEXAPRO) 10 MG tablet Take 1 tablet (10 mg total) by mouth daily.  30 tablet  5  . ferrous sulfate 325 (65 FE) MG tablet Take 325 mg by mouth 3 (three) times daily.        . fluticasone (FLONASE) 50 MCG/ACT nasal spray Place 2 sprays into the nose daily as needed.        . ibandronate (BONIVA) 150 MG tablet Take in the morning with a full glass of water, on an empty stomach,  4 tablet  3  . losartan (COZAAR) 25 MG tablet Take 1 tablet (25 mg total) by mouth daily.  30 tablet  6  . omeprazole (PRILOSEC) 20 MG capsule Take 1 capsule (20 mg total) by mouth daily.  30 capsule  5  . potassium chloride SA (K-DUR,KLOR-CON) 20  MEQ tablet Take 1 tablet (20 mEq total) by mouth daily as needed (muscle cramping).  60 tablet  11  . rosuvastatin (CRESTOR) 20 MG tablet Take 1 tablet (20 mg total) by mouth every other day.  30 tablet  5  . tiotropium (SPIRIVA HANDIHALER) 18 MCG inhalation capsule Place 1 capsule (18 mcg total) into inhaler and inhale daily.  30 capsule  6  . traZODone (DESYREL) 100 MG tablet Take 100 mg by mouth at bedtime.         Current Facility-Administered Medications on File Prior to Visit  Medication Dose Route Frequency Provider Last Rate Last Dose  . cyanocobalamin ((VITAMIN B-12)) injection 1,000 mcg  1,000 mcg Intramuscular Q30 days Judie Petit, MD   1,000 mcg at 04/03/11 1144     patient denies chest pain, shortness of breath, orthopnea. Denies lower extremity edema, abdominal pain, change in appetite, change in bowel movements. Patient denies rashes, musculoskeletal complaints. No other specific complaints in a complete review of systems.   BP 122/70  Pulse 83  Temp(Src) 97.9 F (36.6 C) (Oral)  Wt 189 lb (85.73 kg)  SpO2  93% Elderly female in no acute distress. Wearing oxygen. HEENT exam atraumatic, normocephalic, extraocular muscles are intact. Neck is supple. No jugular venous distention no thyromegaly. Chest clear to auscultation without increased work of breathing. Cardiac exam S1 and S2 are regular. Abdominal exam active bowel sounds, soft, nontender.   ? UTI---send for culture. If culture negative then consider urology evaluation

## 2011-09-16 ENCOUNTER — Ambulatory Visit (INDEPENDENT_AMBULATORY_CARE_PROVIDER_SITE_OTHER)
Admission: RE | Admit: 2011-09-16 | Discharge: 2011-09-16 | Disposition: A | Discharge status: Home / Self Care | Payer: Medicare Other | Source: Ambulatory Visit | Attending: Internal Medicine | Admitting: Internal Medicine

## 2011-09-16 DIAGNOSIS — R0602 Shortness of breath: Secondary | ICD-10-CM

## 2011-09-17 LAB — URINE CULTURE: Organism ID, Bacteria: NO GROWTH

## 2011-09-19 ENCOUNTER — Telehealth: Payer: Self-pay | Admitting: Internal Medicine

## 2011-09-19 NOTE — Telephone Encounter (Signed)
Pt scheduled to see Dr. Arlyce Dice 10/20/11@3pm . Pt aware of appt date and time.

## 2011-09-19 NOTE — Telephone Encounter (Signed)
Bonita Quin, Let's make an appointment for this patient.

## 2011-09-19 NOTE — Telephone Encounter (Signed)
Pamela Hampton   Please let her know that ct chest does not show any evidence of lung cancer. Shows emphysema/copd. Will see her in 2 months as scheduled  CT does shows hiatal hernia which means the stomach is pulled up into chest. This can cause acid reflux. We do not know much about this other than mentioned. She will need to discuss this with PMD. But, I will copy this message to dR Arlyce Dice who is her GI doc in case he needs to see her for this  Thanks MR

## 2011-09-22 ENCOUNTER — Ambulatory Visit (INDEPENDENT_AMBULATORY_CARE_PROVIDER_SITE_OTHER): Payer: Medicare Other | Admitting: Cardiovascular Disease

## 2011-09-22 ENCOUNTER — Encounter: Payer: Self-pay | Admitting: Cardiovascular Disease

## 2011-09-22 DIAGNOSIS — I2789 Other specified pulmonary heart diseases: Secondary | ICD-10-CM

## 2011-09-22 DIAGNOSIS — J4489 Other specified chronic obstructive pulmonary disease: Secondary | ICD-10-CM

## 2011-09-22 DIAGNOSIS — I272 Pulmonary hypertension, unspecified: Secondary | ICD-10-CM

## 2011-09-22 DIAGNOSIS — J449 Chronic obstructive pulmonary disease, unspecified: Secondary | ICD-10-CM

## 2011-09-22 DIAGNOSIS — E785 Hyperlipidemia, unspecified: Secondary | ICD-10-CM

## 2011-09-22 DIAGNOSIS — K219 Gastro-esophageal reflux disease without esophagitis: Secondary | ICD-10-CM

## 2011-09-22 DIAGNOSIS — R0602 Shortness of breath: Secondary | ICD-10-CM

## 2011-09-22 DIAGNOSIS — I10 Essential (primary) hypertension: Secondary | ICD-10-CM

## 2011-09-22 NOTE — Progress Notes (Addendum)
71 yo referred by pulmonary.,  Long standing dyspnea on oxgyen since 2002.  Recently worse the last 6 months after UTI.  CT shows no PE but bad emphysema.  Pulmonary note indicates SSCP atypical but she denies this to me today.  ? Normal myovue 3 years ago No history of CAD.  Has not had recent echo.  Dyspnea chronic Now on 3L oxygen.  No cough sputum or hemoptysis.  Compliant with meds  ROS: Denies fever, malais, weight loss, blurry vision, decreased visual acuity, cough, sputum, SOB, hemoptysis, pleuritic pain, palpitaitons, heartburn, abdominal pain, melena, lower extremity edema, claudication, or rash.  All other systems reviewed and negative   General: Affect appropriate Chronically ill on oxygen HEENT: normal Neck supple with no adenopathy JVP normal no bruits no thyromegaly Lungs Poor breath sounds no wheezing and good diaphragmatic motion Heart:  S1/S2 no murmur,rub, gallop or click PMI normal Abdomen: benighn, BS positve, no tenderness, no AAA no bruit.  No HSM or HJR Distal pulses intact with no bruits No edema Neuro non-focal Skin warm and dry No muscular weakness  Medications Current Outpatient Prescriptions  Medication Sig Dispense Refill  . albuterol (PROAIR HFA) 108 (90 BASE) MCG/ACT inhaler Inhale 2 puffs into the lungs every 4 (four) hours as needed. For SOB       . budesonide-formoterol (SYMBICORT) 160-4.5 MCG/ACT inhaler Inhale 2 puffs into the lungs 2 (two) times daily.  1 Inhaler  12  . escitalopram (LEXAPRO) 10 MG tablet Take 1 tablet (10 mg total) by mouth daily.  30 tablet  5  . fluticasone (FLONASE) 50 MCG/ACT nasal spray Place 2 sprays into the nose daily as needed.        . ibandronate (BONIVA) 150 MG tablet Take in the morning with a full glass of water, on an empty stomach,  4 tablet  3  . losartan (COZAAR) 25 MG tablet Take 1 tablet (25 mg total) by mouth daily.  30 tablet  6  . omeprazole (PRILOSEC) 20 MG capsule Take 1 capsule (20 mg total) by mouth  daily.  30 capsule  5  . potassium chloride SA (K-DUR,KLOR-CON) 20 MEQ tablet Take 1 tablet (20 mEq total) by mouth daily as needed (muscle cramping).  60 tablet  11  . rosuvastatin (CRESTOR) 20 MG tablet Take 1 tablet (20 mg total) by mouth every other day.  30 tablet  5  . tiotropium (SPIRIVA HANDIHALER) 18 MCG inhalation capsule Place 1 capsule (18 mcg total) into inhaler and inhale daily.  30 capsule  6  . traZODone (DESYREL) 100 MG tablet Take 100 mg by mouth at bedtime.         Current Facility-Administered Medications  Medication Dose Route Frequency Provider Last Rate Last Dose  . cyanocobalamin ((VITAMIN B-12)) injection 1,000 mcg  1,000 mcg Intramuscular Q30 days Judie Petit, MD   1,000 mcg at 04/03/11 1144    Allergies Review of patient's allergies indicates no known allergies.  Family History: Family History  Problem Relation Age of Onset  . Heart disease Mother   . Diabetes Mother   . Cancer Father     Stomach  . Diabetes Sister   . Cancer Brother     Esophageal   . Diabetes Son   . Diabetes Son     Social History: History   Social History  . Marital Status: Divorced    Spouse Name: N/A    Number of Children: 4  . Years of Education: N/A  Occupational History  .     Social History Main Topics  . Smoking status: Former Smoker -- 2.0 packs/day for 30 years    Quit date: 07/28/2002  . Smokeless tobacco: Never Used  . Alcohol Use: No  . Drug Use: No  . Sexually Active: Not on file   Other Topics Concern  . Not on file   Social History Narrative  . No narrative on file    Electrocardiogram:  NSR rate 85 nonspecific inferior ST changes from artifact.  Computer incorrectly reads as flutter  Assessment and Plan

## 2011-09-22 NOTE — Assessment & Plan Note (Signed)
This is primary issue.  F/U echo to R/O component of pulmonary HTN and make sure EF normal

## 2011-09-22 NOTE — Assessment & Plan Note (Signed)
Well controlled.  Continue current medications and low sodium Dash type diet.    

## 2011-09-22 NOTE — Assessment & Plan Note (Signed)
Large hiatal hernia on CT  Has F/U with Arlyce Dice already

## 2011-09-22 NOTE — Assessment & Plan Note (Signed)
Cholesterol is at goal.  Continue current dose of statin and diet Rx.  No myalgias or side effects.  F/U  LFT's in 6 months. No results found for this basename: LDLCALC             

## 2011-09-22 NOTE — Patient Instructions (Signed)
Your physician recommends that you schedule a follow-up appointment in: AS NEEDED Your physician has requested that you have an echocardiogram. Echocardiography is a painless test that uses sound waves to create images of your heart. It provides your doctor with information about the size and shape of your heart and how well your heart's chambers and valves are working. This procedure takes approximately one hour. There are no restrictions for this procedure. DX PULMONARY HYPERTENSION Your physician recommends that you continue on your current medications as directed. Please refer to the Current Medication list given to you today.

## 2011-09-22 NOTE — Telephone Encounter (Signed)
Pt aware. Jennifer Castillo, CMA  

## 2011-10-01 ENCOUNTER — Other Ambulatory Visit: Payer: Self-pay

## 2011-10-01 ENCOUNTER — Ambulatory Visit (HOSPITAL_COMMUNITY): Payer: Medicare Other | Attending: Cardiology

## 2011-10-01 DIAGNOSIS — E669 Obesity, unspecified: Secondary | ICD-10-CM | POA: Insufficient documentation

## 2011-10-01 DIAGNOSIS — Z9981 Dependence on supplemental oxygen: Secondary | ICD-10-CM | POA: Insufficient documentation

## 2011-10-01 DIAGNOSIS — R0609 Other forms of dyspnea: Secondary | ICD-10-CM | POA: Insufficient documentation

## 2011-10-01 DIAGNOSIS — R0989 Other specified symptoms and signs involving the circulatory and respiratory systems: Secondary | ICD-10-CM | POA: Insufficient documentation

## 2011-10-01 DIAGNOSIS — E785 Hyperlipidemia, unspecified: Secondary | ICD-10-CM | POA: Insufficient documentation

## 2011-10-01 DIAGNOSIS — I10 Essential (primary) hypertension: Secondary | ICD-10-CM | POA: Insufficient documentation

## 2011-10-01 DIAGNOSIS — R0602 Shortness of breath: Secondary | ICD-10-CM

## 2011-10-01 DIAGNOSIS — I272 Pulmonary hypertension, unspecified: Secondary | ICD-10-CM

## 2011-10-03 ENCOUNTER — Other Ambulatory Visit: Payer: Self-pay | Admitting: *Deleted

## 2011-10-03 MED ORDER — IBANDRONATE SODIUM 150 MG PO TABS: ORAL_TABLET | ORAL | Status: DC

## 2011-10-03 MED ORDER — TRAZODONE HCL 100 MG PO TABS: 100.0000 mg | ORAL_TABLET | Freq: Every day | ORAL | Status: DC

## 2011-10-20 ENCOUNTER — Ambulatory Visit (INDEPENDENT_AMBULATORY_CARE_PROVIDER_SITE_OTHER): Payer: Medicare Other | Admitting: Gastroenterology

## 2011-10-20 ENCOUNTER — Encounter: Payer: Self-pay | Admitting: Gastroenterology

## 2011-10-20 VITALS — BP 114/60 | HR 88 | Ht 64.0 in | Wt 191.4 lb

## 2011-10-20 DIAGNOSIS — K219 Gastro-esophageal reflux disease without esophagitis: Secondary | ICD-10-CM

## 2011-10-20 DIAGNOSIS — K222 Esophageal obstruction: Secondary | ICD-10-CM | POA: Insufficient documentation

## 2011-10-20 MED ORDER — RABEPRAZOLE SODIUM 20 MG PO TBEC: 20.0000 mg | DELAYED_RELEASE_TABLET | Freq: Every day | ORAL | Status: DC

## 2011-10-20 NOTE — Assessment & Plan Note (Signed)
She is symptomatic despite therapy with omeprazole.  Recommendations #1 trial of AcipHex 20 mg daily

## 2011-10-20 NOTE — Patient Instructions (Signed)
Upper GI Endoscopy Upper GI endoscopy means using a flexible scope to look at the esophagus, stomach, and upper small bowel. This is done to make a diagnosis in people with heartburn, abdominal pain, or abnormal bleeding. Sometimes an endoscope is needed to remove foreign bodies or food that become stuck in the esophagus; it can also be used to take biopsy samples. For the best results, do not eat or drink for 8 hours before having your upper endoscopy.  To perform the endoscopy, you will probably be sedated and your throat will be numbed with a special spray. The endoscope is then slowly passed down your throat (this will not interfere with your breathing). An endoscopy exam takes 15 to 30 minutes to complete and there is no real pain. Patients rarely remember much about the procedure. The results of the test may take several days if a biopsy or other test is taken.  You may have a sore throat after an endoscopy exam. Serious complications are very rare. Stick to liquids and soft foods until your pain is better. Do not drive a car or operate any dangerous equipment for at least 24 hours after being sedated. SEEK IMMEDIATE MEDICAL CARE IF:   You have severe throat pain.   You have shortness of breath.   You have bleeding problems.   You have a fever.   You have difficulty recovering from your sedation.  Document Released: 08/21/2004 Document Revised: 07/03/2011 Document Reviewed: 07/16/2008 [employer] Patient Information 2012 [employer]. 

## 2011-10-20 NOTE — Assessment & Plan Note (Signed)
Plan repeat endoscopy with dilatation because of recurrent dysphagia.

## 2011-10-20 NOTE — Progress Notes (Signed)
History of Present Illness:  [name] is a 71 year old white female with oxygen-dependent COPD, Barrett's esophagus,  and esophageal stricture here because of complaints of recurrent dysphagia and regurgitation of gastric contents. In November, 2010 she underwent balloon dilatation of a distal esophageal stricture. Barrett's esophagus was identified. Despite taking omeprazole daily she has frequent regurgitation of gastric contents and throat burning. She is also complaining of dysphagia to solids.   Review of Systems: She complains of pain in her shoulders and neck. She also develops swelling of her legs with prolonged standing. Pertinent positive and negative review of systems were noted in the above HPI section. All other review of systems were otherwise negative.    Current Medications, Allergies, Past Medical History, Past Surgical History, Family History and Social History were reviewed in [employer] electronic medical record  Vital signs were reviewed in today's medical record. Physical Exam: General: Chronically ill-appearing Head: Normocephalic and atraumatic Eyes:  sclerae anicteric, EOMI Ears: Normal auditory acuity Mouth: No deformity or lesions Lungs: Clear throughout to auscultation Heart: Regular rate and rhythm; no murmurs, rubs or bruits Abdomen: Soft, non tender and non distended. No masses, hepatosplenomegaly or hernias noted. Normal Bowel sounds Rectal:deferred Musculoskeletal: Symmetrical with no gross deformities  Pulses:  Normal pulses noted Extremities: No clubbing, cyanosis, edema or deformities noted Neurological: Alert oriented x 4, grossly nonfocal Psychological:  Alert and cooperative. Normal mood and affect

## 2011-10-23 ENCOUNTER — Ambulatory Visit (AMBULATORY_SURGERY_CENTER): Payer: Medicare Other | Admitting: Gastroenterology

## 2011-10-23 ENCOUNTER — Other Ambulatory Visit: Payer: Self-pay | Admitting: Gastroenterology

## 2011-10-23 ENCOUNTER — Encounter: Payer: Self-pay | Admitting: Gastroenterology

## 2011-10-23 VITALS — BP 128/80 | HR 73 | Temp 96.0°F | Resp 22 | Ht 64.0 in | Wt 191.0 lb

## 2011-10-23 DIAGNOSIS — K2270 Barrett's esophagus without dysplasia: Secondary | ICD-10-CM

## 2011-10-23 DIAGNOSIS — K219 Gastro-esophageal reflux disease without esophagitis: Secondary | ICD-10-CM

## 2011-10-23 DIAGNOSIS — K222 Esophageal obstruction: Secondary | ICD-10-CM

## 2011-10-23 MED ORDER — SODIUM CHLORIDE 0.9 % IV SOLN: 500.0000 mL | INTRAVENOUS | Status: DC

## 2011-10-23 NOTE — Patient Instructions (Addendum)
DR. Marzetta Board OFFICE WILL CALL YOU WITH AN APPOINTMENT FOR AN UPPER G.I. SERIES.  CONTINUE YOUR ACIPHEX.  CALL DR. KAPLAN'S OFFICE TO ARRANGE AN APPOINTMENT IN 3 WEEKS.YOU HAD AN ENDOSCOPIC PROCEDURE TODAY AT THE [facility]: Refer to the procedure report that was given to you for any specific questions about what was found during the examination.  If the procedure report does not answer your questions, please call your gastroenterologist to clarify.  If you requested that your care partner not be given the details of your procedure findings, then the procedure report has been included in a sealed envelope for you to review at your convenience later.  YOU SHOULD EXPECT: Some feelings of bloating in the abdomen. Passage of more gas than usual.  Walking can help get rid of the air that was put into your GI tract during the procedure and reduce the bloating. If you had a lower endoscopy (such as a colonoscopy or flexible sigmoidoscopy) you may notice spotting of blood in your stool or on the toilet paper. If you underwent a bowel prep for your procedure, then you may not have a normal bowel movement for a few days.  DIET: Your first meal following the procedure should be a light meal and then it is ok to progress to your normal diet.  A half-sandwich or bowl of soup is an example of a good first meal.  Heavy or fried foods are harder to digest and may make you feel nauseous or bloated.  Likewise meals heavy in dairy and vegetables can cause extra gas to form and this can also increase the bloating.  Drink plenty of fluids but you should avoid alcoholic beverages for 24 hours.  ACTIVITY: Your care partner should take you home directly after the procedure.  You should plan to take it easy, moving slowly for the rest of the day.  You can resume normal activity the day after the procedure however you should NOT DRIVE or use heavy machinery for 24 hours (because of the sedation medicines used during  the test).    SYMPTOMS TO REPORT IMMEDIATELY: A gastroenterologist can be reached at any hour.  During normal business hours, 8:30 AM to 5:00 PM Monday through Friday, call [phone].  After hours and on weekends, please call the GI answering service at [phone] who will take a message and have the physician on call contact you.   Following lower endoscopy (colonoscopy or flexible sigmoidoscopy):  Excessive amounts of blood in the stool  Significant tenderness or worsening of abdominal pains  Swelling of the abdomen that is new, acute  Fever of 100F or higher  Following upper endoscopy (EGD)  Vomiting of blood or coffee ground material  New chest pain or pain under the shoulder blades  Painful or persistently difficult swallowing  New shortness of breath  Fever of 100F or higher  Black, tarry-looking stools  FOLLOW UP: If any biopsies were taken you will be contacted by phone or by letter within the next 1-3 weeks.  Call your gastroenterologist if you have not heard about the biopsies in 3 weeks.  Our staff will call the home number listed on your records the next business day following your procedure to check on you and address any questions or concerns that you may have at that time regarding the information given to you following your procedure. This is a courtesy call and so if there is no answer at the home number and we have not  heard from you through the emergency physician on call, we will assume that you have returned to your regular daily activities without incident.  SIGNATURES/CONFIDENTIALITY: You and/or your care partner have signed paperwork which will be entered into your electronic medical record.  These signatures attest to the fact that that the information above on your After Visit Summary has been reviewed and is understood.  Full responsibility of the confidentiality of this discharge information lies with you and/or your care-partner. YOU HAD AN ENDOSCOPIC  PROCEDURE TODAY AT THE [facility]: Refer to the procedure report that was given to you for any specific questions about what was found during the examination.  If the procedure report does not answer your questions, please call your gastroenterologist to clarify.  If you requested that your care partner not be given the details of your procedure findings, then the procedure report has been included in a sealed envelope for you to review at your convenience later.  YOU SHOULD EXPECT: Some feelings of bloating in the abdomen. Passage of more gas than usual.  Walking can help get rid of the air that was put into your GI tract during the procedure and reduce the bloating. If you had a lower endoscopy (such as a colonoscopy or flexible sigmoidoscopy) you may notice spotting of blood in your stool or on the toilet paper. If you underwent a bowel prep for your procedure, then you may not have a normal bowel movement for a few days.  DIET: Your first meal following the procedure should be a light meal and then it is ok to progress to your normal diet.  A half-sandwich or bowl of soup is an example of a good first meal.  Heavy or fried foods are harder to digest and may make you feel nauseous or bloated.  Likewise meals heavy in dairy and vegetables can cause extra gas to form and this can also increase the bloating.  Drink plenty of fluids but you should avoid alcoholic beverages for 24 hours.  ACTIVITY: Your care partner should take you home directly after the procedure.  You should plan to take it easy, moving slowly for the rest of the day.  You can resume normal activity the day after the procedure however you should NOT DRIVE or use heavy machinery for 24 hours (because of the sedation medicines used during the test).    SYMPTOMS TO REPORT IMMEDIATELY: A gastroenterologist can be reached at any hour.  During normal business hours, 8:30 AM to 5:00 PM Monday through Friday, call [phone].   After hours and on weekends, please call the GI answering service at [phone] who will take a message and have the physician on call contact you.   Following lower endoscopy (colonoscopy or flexible sigmoidoscopy):  Excessive amounts of blood in the stool  Significant tenderness or worsening of abdominal pains  Swelling of the abdomen that is new, acute  Fever of 100F or higher  Following upper endoscopy (EGD)  Vomiting of blood or coffee ground material  New chest pain or pain under the shoulder blades  Painful or persistently difficult swallowing  New shortness of breath  Fever of 100F or higher  Black, tarry-looking stools  FOLLOW UP: If any biopsies were taken you will be contacted by phone or by letter within the next 1-3 weeks.  Call your gastroenterologist if you have not heard about the biopsies in 3 weeks.  Our staff will call the home number listed on your records  the next business day following your procedure to check on you and address any questions or concerns that you may have at that time regarding the information given to you following your procedure. This is a courtesy call and so if there is no answer at the home number and we have not heard from you through the emergency physician on call, we will assume that you have returned to your regular daily activities without incident.  SIGNATURES/CONFIDENTIALITY: You and/or your care partner have signed paperwork which will be entered into your electronic medical record.  These signatures attest to the fact that that the information above on your After Visit Summary has been reviewed and is understood.  Full responsibility of the confidentiality of this discharge information lies with you and/or your care-partner.

## 2011-10-23 NOTE — Progress Notes (Signed)
Patient did not experience any of the following events: a burn prior to discharge; a fall within the facility; wrong site/side/patient/procedure/implant event; or a hospital transfer or hospital admission upon discharge from the facility. (G8907) Patient did not have preoperative order for IV antibiotic SSI prophylaxis. (G8918)  

## 2011-10-23 NOTE — Progress Notes (Signed)
Propofol per s camp crna. ewm

## 2011-10-23 NOTE — Op Note (Signed)
 [facility] [address] [place], [place]  [address]  ENDOSCOPY PROCEDURE REPORT  PATIENT:  Pamela Hampton, Pamela Hampton  MR#:  [id number] BIRTHDATE:  11/19/1940, 70 yrs. old  GENDER:  female  ENDOSCOPIST:  Barbette Hair. Arlyce Dice, MD Referred by:  PROCEDURE DATE:  10/23/2011 PROCEDURE:  EGD with biopsy, 19147 ASA CLASS:  Class II INDICATIONS:  GERD, dysphagia, h/o Barrett's Esophagus  MEDICATIONS:   MAC sedation, administered by CRNA propofol 80mg IV, glycopyrrolate (Robinal) 0.2 mg IV, 0.6cc simethancone 0.6 cc PO TOPICAL ANESTHETIC:  DESCRIPTION OF PROCEDURE:   After the risks and benefits of the procedure were explained, informed consent was obtained.  The Pentax [id number] [id number] endoscope was introduced through the mouth and advanced to the third portion of the duodenum.  The instrument was slowly withdrawn as the mucosa was fully examined. <<PROCEDUREIMAGES>>  Barrett's esophagus was found. Z line at 24cm. Barrett's in mid and distal esophagus. Bxs taken at several different levels (see image5, image6, and image1).  A hiatal hernia was found at the gastroesophageal junction. 12cm hiatal hernia  Otherwise the examination was normal (see image3 and image4).    Retroflexed views revealed no abnormalities.    The scope was then withdrawn from the patient and the procedure completed.  COMPLICATIONS:  None  ENDOSCOPIC IMPRESSION: 1) Barrett's esophagus 2) Hiatal hernia at the gastroesophageal junction 3) Otherwise normal examination RECOMMENDATIONS: 1) continue PPI - aciphex . 2) My office will arrange for you to have a Barium UGI test. This is a radiology test to examine your UGI tract. 3) Call office next 2-3 days to schedule an office appointment for 3 weeks  ______________________________ Barbette Hair. Arlyce Dice, MD  CC:  Lindley Magnus, MD  n. Rosalie DoctorBarbette Hair. Raylee Strehl at 10/23/2011 03:20 PM  Joice Lofts, [phone]

## 2011-10-27 ENCOUNTER — Telehealth: Payer: Self-pay | Admitting: *Deleted

## 2011-10-27 NOTE — Telephone Encounter (Signed)
  Follow up Call-  Call back number 10/23/2011  Post procedure Call Back phone  # [phone] cell   Permission to leave phone message Yes     No answer at # given.  Message left on voicemail.

## 2011-10-30 ENCOUNTER — Other Ambulatory Visit: Payer: Self-pay | Admitting: Gastroenterology

## 2011-10-30 ENCOUNTER — Telehealth: Payer: Self-pay

## 2011-10-30 NOTE — Telephone Encounter (Signed)
Pt scheduled for UGI at [facility] 10/31/11 arrival time 9:15am for a 9:30am appt. Pt aware of appt date and time.

## 2011-10-31 ENCOUNTER — Encounter: Payer: Self-pay | Admitting: Gastroenterology

## 2011-10-31 ENCOUNTER — Ambulatory Visit (HOSPITAL_COMMUNITY)
Admission: RE | Admit: 2011-10-31 | Discharge: 2011-10-31 | Disposition: A | Discharge status: Home / Self Care | Payer: Medicare Other | Source: Ambulatory Visit | Attending: Gastroenterology | Admitting: Gastroenterology

## 2011-10-31 DIAGNOSIS — R1310 Dysphagia, unspecified: Secondary | ICD-10-CM | POA: Insufficient documentation

## 2011-10-31 DIAGNOSIS — R109 Unspecified abdominal pain: Secondary | ICD-10-CM | POA: Insufficient documentation

## 2011-10-31 DIAGNOSIS — K449 Diaphragmatic hernia without obstruction or gangrene: Secondary | ICD-10-CM | POA: Insufficient documentation

## 2011-11-13 ENCOUNTER — Encounter (HOSPITAL_COMMUNITY)
Admission: RE | Admit: 2011-11-13 | Discharge: 2011-11-13 | Disposition: A | Discharge status: Home / Self Care | Payer: Medicare Other | Source: Ambulatory Visit | Attending: Internal Medicine | Admitting: Internal Medicine

## 2011-11-13 ENCOUNTER — Encounter (HOSPITAL_COMMUNITY): Payer: Self-pay

## 2011-11-13 DIAGNOSIS — J449 Chronic obstructive pulmonary disease, unspecified: Secondary | ICD-10-CM | POA: Insufficient documentation

## 2011-11-13 DIAGNOSIS — I10 Essential (primary) hypertension: Secondary | ICD-10-CM | POA: Insufficient documentation

## 2011-11-13 DIAGNOSIS — K219 Gastro-esophageal reflux disease without esophagitis: Secondary | ICD-10-CM | POA: Insufficient documentation

## 2011-11-13 DIAGNOSIS — J4489 Other specified chronic obstructive pulmonary disease: Secondary | ICD-10-CM | POA: Insufficient documentation

## 2011-11-13 DIAGNOSIS — E785 Hyperlipidemia, unspecified: Secondary | ICD-10-CM | POA: Insufficient documentation

## 2011-11-13 DIAGNOSIS — Z5189 Encounter for other specified aftercare: Secondary | ICD-10-CM | POA: Insufficient documentation

## 2011-11-13 NOTE — Progress Notes (Signed)
Pamela Hampton came in today for orientation to Pulmonary Rehab.  We reviewed her health history, medications and did a nurse assessment.  Demonstration and practice of PLB using pulse oximeter.  Patient able to return demonstration satisfactorily. Safety and hand hygiene in the exercise area reviewed with patient.  Patient voices understanding.  A six minute walk test was done, she did experience back pain, after 2 minute walking and needed to sit down at 71min35 sec into the test.  She was able to resume walking and did complete the test.  Oxygen levels were  92 % to 95% with walking.  Her pain level was 9/10 at the rest break.  We discussed goals of being able to build stamina, decrease shortness of breath and to be able to increase walking distance.  We also discussed using a rolling walker so she could walk more, being able to stop to rest when she needed for back pain.  She is relieved when she sits down after a minute and half rest.  We will continue to encourage and support this nice lady.

## 2011-11-17 ENCOUNTER — Encounter: Payer: Self-pay | Admitting: Internal Medicine

## 2011-11-17 ENCOUNTER — Ambulatory Visit (INDEPENDENT_AMBULATORY_CARE_PROVIDER_SITE_OTHER): Payer: Medicare Other | Admitting: Internal Medicine

## 2011-11-17 VITALS — BP 152/80 | HR 103 | Temp 98.0°F | Ht 64.0 in | Wt 191.6 lb

## 2011-11-17 DIAGNOSIS — I10 Essential (primary) hypertension: Secondary | ICD-10-CM

## 2011-11-17 DIAGNOSIS — Z129 Encounter for screening for malignant neoplasm, site unspecified: Secondary | ICD-10-CM

## 2011-11-17 DIAGNOSIS — J449 Chronic obstructive pulmonary disease, unspecified: Secondary | ICD-10-CM

## 2011-11-17 MED ORDER — OMEPRAZOLE 20 MG PO CPDR: 20.0000 mg | DELAYED_RELEASE_CAPSULE | Freq: Every day | ORAL | Status: DC

## 2011-11-17 NOTE — Assessment & Plan Note (Signed)
#  BP - conitnue losartan generic 25mg  once daily   #Cancer risk and screening  - no lung cancer on CT chest feb 2013  - we will discuss feb 2014 if you need another CT chest ; based on NEJM study  #Followup Returin in 4 months or sooner if needed

## 2011-11-17 NOTE — Progress Notes (Signed)
Subjective:    Patient ID: Pamela Hampton, female    DOB: 05/24/1941, 71 y.o.   MRN: [id number]  HPI IOV 05/29/11: 71 year old female. 60 pack smoker quit 2004. Frequent UTI. 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 > 30# weight gain since past 2 years  Carries diagnosis of copd. Seen by Dr Lesia Hausen 05/09/11 and referred here for copd with hypoxemia needing 4L O2. Patient main complaint is dyspnea. Dyspnea onset was in 2002 and diagnosed with copd at that time. Initially started on nocutrnal o2 at that time. This is chronic, insidious onset, and progressive past 5 months. Since 5 months using o2 all the time. Rates dyspnea as severe and sometimes associated with panic. Denies associated cough other than very mild occassional cough and mild white sputum. Has associated mild pedal edema on and off past few months. Last spirometry 2007 per hx: details not known. CT scan sinus Nov 2011: normal. CXR may 2008 - clear but large hiatal hernia +  Reports associated chest pain - describes it as ache in back of sternum for past 3-4 months. Insidious onset. Has gotten better past few months. More at rest. Less with exertion. Denies known associated CAD. Recollects chemical cardiac stress test 3 years ago which she thinks was normal  Noted she is on ace inhibitor.   REC You have moderate-severe copd  We will change your oxygen to portable system  Please continue symbicort  Start spiriva - take sample, script and show technique  Continue oxygen  Will make referral to pulmonary rehab  Have pneumonia vaccine  AT fu do alpha 1 test  STop lisinopril - can make you cough. Take losartan generic 25mg  once daily instead; take script  Returin in 2 months    OV 09/09/2011  FU COPD.   Was advised to continue symbicort and spiriva added at last visit. She is using o2 all the time. She has stopped lisinopril. PFT 07/06/12 showed Gold stage 2 copd. FEv1 1.2L/60% with 8%BD response. But still not feeling any better in terms  of dyspnea. FEels that there is thick mucus inside chest and unable to bring it out. IN terms of chest pain, this is resolved but she is concerned that dyspnea could be due to heart issues She is yet to hear from rehab and very concerned about it  Past, Family, Social reviewed: She is concerned about family hx and is expressing it again - cancer and mI. Mom - died of MI. Father died of stomach cancer. Brother died of throat cancer. Immediate Elder sister died of lung cancer. Baby brother died of brain cancer. Eldest sister recently diagnosed with lung cancer - prognosis of 4-5 months. She is asking about her odds for lung cancer (esp with her past hx of hysterectomy 1978, and had cancer in 1998 in spot of uterus - s/p XRT). Very keen to know this and wants any test possible for it.  #COPD  - You have moderate-severe copd  - continue oxygen portable system, symbicort and spiriva  - have asked pulmonary rehab why they have not contacted you; once I find out will call you back  - will ensure your pneumonia vaccine and flu vaccine status is updated  - today or at followup do alpha 1 test  #BP  - conitnue losartan generic 25mg  once daily  #Shortness of breath  - will refer you to cardiology given concern that some of your shortness of breath might be due to heart issue  #Cancer  risk and screening  - will do CT chest without contrast (low radiation dose protocol) for lung cancer screening; might need insurance approval  #Followup  Returin in 2 months  OV 11/17/2011 Followup Moderate-Severe COPD, Lung cancer screen, and Hypertension.   bp 150 sbp - did not take med today  feels well but still class 3 dyspnea. Will start rehab tomorrow. No cough. Anxious to get better. SAw Dr Eden Emms cards feb 2013 - dyspnea thought to be due to pulm issues  Relieved ct has no cancer; personally reviewed CT Wants stroller with wheels through DME Wants aciphex changed to omeprazole script   ECHO 10/01/11: PASP  slightly elevated 47, Gr 1 diast dysfn  CT chest 09/16/11:  No lung mass is identified.  IMPRESSION:  1. Severe emphysema.  2. Large hiatal hernia.  3. Atherosclerosis.  Original Report Authenticated By: Dellia Cloud, M.D.   Past, Family, Social reviewed: no change since last visit   Current outpatient prescriptions:albuterol (PROAIR HFA) 108 (90 BASE) MCG/ACT inhaler, Inhale 2 puffs into the lungs every 4 (four) hours as needed. For SOB , Disp: , Rfl: ;  budesonide-formoterol (SYMBICORT) 160-4.5 MCG/ACT inhaler, Inhale 2 puffs into the lungs 2 (two) times daily., Disp: 1 Inhaler, Rfl: 12;  escitalopram (LEXAPRO) 10 MG tablet, Take 1 tablet (10 mg total) by mouth daily., Disp: 30 tablet, Rfl: 5 fluticasone (FLONASE) 50 MCG/ACT nasal spray, Place 2 sprays into the nose daily as needed.  , Disp: , Rfl: ;  ibandronate (BONIVA) 150 MG tablet, Take in the morning with a full glass of water, on an empty stomach,, Disp: 4 tablet, Rfl: 3;  losartan (COZAAR) 25 MG tablet, Take 1 tablet (25 mg total) by mouth daily., Disp: 30 tablet, Rfl: 6;  potassium chloride SA (K-DUR,KLOR-CON) 20 MEQ tablet, Take 20 mEq by mouth daily as needed., Disp: , Rfl:  RABEprazole (ACIPHEX) 20 MG tablet, Take 1 tablet (20 mg total) by mouth daily., Disp: 30 tablet, Rfl: 1;  rosuvastatin (CRESTOR) 20 MG tablet, Take 1 tablet (20 mg total) by mouth every other day., Disp: 30 tablet, Rfl: 5;  tiotropium (SPIRIVA HANDIHALER) 18 MCG inhalation capsule, Place 1 capsule (18 mcg total) into inhaler and inhale daily., Disp: 30 capsule, Rfl: 6 traZODone (DESYREL) 100 MG tablet, Take 1 tablet (100 mg total) by mouth at bedtime., Disp: 90 tablet, Rfl: 1;  DISCONTD: potassium chloride SA (K-DUR,KLOR-CON) 20 MEQ tablet, Take 1 tablet (20 mEq total) by mouth daily as needed (muscle cramping)., Disp: 60 tablet, Rfl: 11;  DISCONTD: omeprazole (PRILOSEC) 20 MG capsule, Take 1 capsule (20 mg total) by mouth daily., Disp: 30 capsule, Rfl:  5 Current facility-administered medications:DISCONTD: cyanocobalamin ((VITAMIN B-12)) injection 1,000 mcg, 1,000 mcg, Intramuscular, Q30 days, Lindley Magnus, MD, 1,000 mcg at 04/03/11 1144   Review of Systems  Constitutional: Negative for fever and unexpected weight change.  HENT: Negative for ear pain, nosebleeds, congestion, sore throat, rhinorrhea, sneezing, trouble swallowing, dental problem, postnasal drip and sinus pressure.   Eyes: Negative for redness and itching.  Respiratory: Negative for cough, chest tightness, shortness of breath and wheezing.   Cardiovascular: Negative for palpitations and leg swelling.  Gastrointestinal: Negative for nausea and vomiting.  Genitourinary: Negative for dysuria.  Musculoskeletal: Negative for joint swelling.  Skin: Negative for rash.  Neurological: Negative for headaches.  Hematological: Does not bruise/bleed easily.  Psychiatric/Behavioral: Negative for dysphoric mood. The patient is not nervous/anxious.        Objective:   Physical Exam Body  mass index is 31.58 kg/(m^2). Obese Looks well On o2 - wants prortable system Expiration prolonged. No distress.. No wheeze. No accessory muscle use. No crackles Normal percussion Normal S1S2+ Abd: obese, soft, no tenderness Ext: no cyanosis, no clubbing, no edema CNS: AxOX3. Movesl all4s. Speech normal        Assessment & Plan:

## 2011-11-17 NOTE — Patient Instructions (Signed)
#  COPD and shortness of breath  - You have moderate-severe copd - Dr Eden Emms cardiology thinks is all due to lungs and fitness - continue oxygen portable system, symbicort and spiriva - start pulmonary rehab  - today or at followup do alpha  1 test  #BP - conitnue losartan generic 25mg  once daily   #Cancer risk and screening  - no lung cancer on CT chest feb 2013  - we will discuss feb 2014 if you need another CT chest   #Followup Returin in 4 months or sooner if needed

## 2011-11-17 NOTE — Assessment & Plan Note (Signed)
bp high today. sbp 150. But did not taker her am med  Plan Continue but be compliant with losartan

## 2011-11-17 NOTE — Assessment & Plan Note (Signed)
#  COPD and shortness of breath  - You have moderate-severe copd - Dr Eden Emms cardiology thinks is all due to lungs and fitness - continue oxygen portable system, symbicort and spiriva - start pulmonary rehab  - today or at followup do alpha  1 test

## 2011-11-18 ENCOUNTER — Other Ambulatory Visit: Payer: Self-pay | Admitting: *Deleted

## 2011-11-18 ENCOUNTER — Encounter (HOSPITAL_COMMUNITY)
Admission: RE | Admit: 2011-11-18 | Discharge: 2011-11-18 | Disposition: A | Discharge status: Home / Self Care | Payer: Medicare Other | Source: Ambulatory Visit | Attending: Internal Medicine | Admitting: Internal Medicine

## 2011-11-18 DIAGNOSIS — J302 Other seasonal allergic rhinitis: Secondary | ICD-10-CM

## 2011-11-18 MED ORDER — POTASSIUM CHLORIDE CRYS ER 20 MEQ PO TBCR: 20.0000 meq | EXTENDED_RELEASE_TABLET | Freq: Every day | ORAL | Status: DC | PRN

## 2011-11-18 MED ORDER — FLUTICASONE PROPIONATE 50 MCG/ACT NA SUSP: 2.0000 | Freq: Every day | NASAL | Status: DC | PRN

## 2011-11-18 MED ORDER — ALBUTEROL SULFATE HFA 108 (90 BASE) MCG/ACT IN AERS: 2.0000 | INHALATION_SPRAY | RESPIRATORY_TRACT | Status: DC | PRN

## 2011-11-18 NOTE — Progress Notes (Signed)
Ist day of exercise in Pulm Rehab.  Tolerated well.  She was oriented to equipment use and safety, RPE and dyspnea scale. Demonstration and practice of PLB on each exercise station.  She was able to return demonstration, we will encourage and support.  Vital signs stable, oxygen saturations were above 90 % during exercise.   

## 2011-11-20 ENCOUNTER — Encounter (HOSPITAL_COMMUNITY)
Admission: RE | Admit: 2011-11-20 | Discharge: 2011-11-20 | Disposition: A | Discharge status: Home / Self Care | Payer: Medicare Other | Source: Ambulatory Visit | Attending: Internal Medicine | Admitting: Internal Medicine

## 2011-11-25 ENCOUNTER — Encounter (HOSPITAL_COMMUNITY)
Admission: RE | Admit: 2011-11-25 | Discharge: 2011-11-25 | Disposition: A | Discharge status: Home / Self Care | Payer: Medicare Other | Source: Ambulatory Visit | Attending: Internal Medicine | Admitting: Internal Medicine

## 2011-11-26 ENCOUNTER — Encounter: Payer: Self-pay | Admitting: Gastroenterology

## 2011-11-26 ENCOUNTER — Ambulatory Visit (INDEPENDENT_AMBULATORY_CARE_PROVIDER_SITE_OTHER): Payer: Medicare Other | Admitting: Gastroenterology

## 2011-11-26 ENCOUNTER — Other Ambulatory Visit (INDEPENDENT_AMBULATORY_CARE_PROVIDER_SITE_OTHER): Payer: Medicare Other

## 2011-11-26 ENCOUNTER — Other Ambulatory Visit: Payer: Self-pay | Admitting: Gastroenterology

## 2011-11-26 VITALS — BP 138/64 | HR 88 | Ht 63.0 in | Wt 190.4 lb

## 2011-11-26 DIAGNOSIS — Z8774 Personal history of (corrected) congenital malformations of heart and circulatory system: Secondary | ICD-10-CM

## 2011-11-26 DIAGNOSIS — Z8679 Personal history of other diseases of the circulatory system: Secondary | ICD-10-CM

## 2011-11-26 DIAGNOSIS — D509 Iron deficiency anemia, unspecified: Secondary | ICD-10-CM

## 2011-11-26 DIAGNOSIS — Q279 Congenital malformation of peripheral vascular system, unspecified: Secondary | ICD-10-CM

## 2011-11-26 DIAGNOSIS — K2270 Barrett's esophagus without dysplasia: Secondary | ICD-10-CM

## 2011-11-26 DIAGNOSIS — Q2730 Arteriovenous malformation, site unspecified: Secondary | ICD-10-CM | POA: Insufficient documentation

## 2011-11-26 DIAGNOSIS — D649 Anemia, unspecified: Secondary | ICD-10-CM

## 2011-11-26 LAB — CBC WITH DIFFERENTIAL/PLATELET
Basophils Absolute: 0.1 10*3/uL (ref 0.0–0.1)
Eosinophils Absolute: 0.2 10*3/uL (ref 0.0–0.7)
Lymphocytes Relative: 34 % (ref 12.0–46.0)
MCHC: 30.3 g/dL (ref 30.0–36.0)
Monocytes Relative: 10 % (ref 3.0–12.0)
Neutrophils Relative %: 53.3 % (ref 43.0–77.0)
RDW: 21.3 % — ABNORMAL HIGH (ref 11.5–14.6)

## 2011-11-26 MED ORDER — FERROUS SULFATE DRIED 200 (65 FE) MG PO TABS: 200.0000 mg | ORAL_TABLET | Freq: Every day | ORAL | Status: DC

## 2011-11-26 NOTE — Patient Instructions (Addendum)
Call back as needed You will go to the basement for labs today Please return your IFOB kit within 2 weeks

## 2011-11-26 NOTE — Assessment & Plan Note (Signed)
Probable source for iron deficiency anemia.  Plan follow CBC

## 2011-11-26 NOTE — Progress Notes (Signed)
History of Present Illness:  Pamela Hampton has returned following upper endoscopy. 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 She has a large hiatal hernia. An upper GI series demonstrated a large sliding hiatal hernia.. She has occasional dysphagia. She otherwise is feeling well from a GI standpoint.    Review of Systems: She has oxygen-dependent COPD and baseline dyspnea on exertion Pertinent positive and negative review of systems were noted in the above HPI section. All other review of systems were otherwise negative.    Current Medications, Allergies, Past Medical History, Past Surgical History, Family History and Social History were reviewed in [facility] electronic medical record  Vital signs were reviewed in today's medical record. Physical Exam: General: Well developed , well nourished, no acute distress

## 2011-11-26 NOTE — Assessment & Plan Note (Addendum)
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 Presumably this is the source for chronic GI blood loss.  Recommendations #1 check CBC #2 followup Hemoccults

## 2011-11-26 NOTE — Assessment & Plan Note (Signed)
No dysplasia is seen on recent biopsies.  Recommendations #1 followup endoscopy in 3 years

## 2011-11-27 ENCOUNTER — Encounter (HOSPITAL_COMMUNITY)
Admission: RE | Admit: 2011-11-27 | Discharge: 2011-11-27 | Disposition: A | Discharge status: Home / Self Care | Payer: Medicare Other | Source: Ambulatory Visit | Attending: Internal Medicine | Admitting: Internal Medicine

## 2011-11-27 DIAGNOSIS — J4489 Other specified chronic obstructive pulmonary disease: Secondary | ICD-10-CM | POA: Insufficient documentation

## 2011-11-27 DIAGNOSIS — I10 Essential (primary) hypertension: Secondary | ICD-10-CM | POA: Insufficient documentation

## 2011-11-27 DIAGNOSIS — E785 Hyperlipidemia, unspecified: Secondary | ICD-10-CM | POA: Insufficient documentation

## 2011-11-27 DIAGNOSIS — K219 Gastro-esophageal reflux disease without esophagitis: Secondary | ICD-10-CM | POA: Insufficient documentation

## 2011-11-27 DIAGNOSIS — Z5189 Encounter for other specified aftercare: Secondary | ICD-10-CM | POA: Insufficient documentation

## 2011-11-27 DIAGNOSIS — J449 Chronic obstructive pulmonary disease, unspecified: Secondary | ICD-10-CM | POA: Insufficient documentation

## 2011-12-02 ENCOUNTER — Encounter (HOSPITAL_COMMUNITY)
Admission: RE | Admit: 2011-12-02 | Discharge: 2011-12-02 | Disposition: A | Discharge status: Home / Self Care | Payer: Medicare Other | Source: Ambulatory Visit | Attending: Internal Medicine | Admitting: Internal Medicine

## 2011-12-04 ENCOUNTER — Encounter (HOSPITAL_COMMUNITY)
Admission: RE | Admit: 2011-12-04 | Discharge: 2011-12-04 | Disposition: A | Discharge status: Home / Self Care | Payer: Medicare Other | Source: Ambulatory Visit | Attending: Internal Medicine | Admitting: Internal Medicine

## 2011-12-09 ENCOUNTER — Encounter (HOSPITAL_COMMUNITY)
Admission: RE | Admit: 2011-12-09 | Discharge: 2011-12-09 | Disposition: A | Discharge status: Home / Self Care | Payer: Medicare Other | Source: Ambulatory Visit | Attending: Internal Medicine | Admitting: Internal Medicine

## 2011-12-09 NOTE — Progress Notes (Signed)
Completed home exercise with patient. Reviewed exercise progression, routine, exercising at a comfortable pace, RPE/Dyspnea scales, how important it is to own a pulse oximeter and how to use one, weather conditions, warning signs and symptoms with exercise, and CP/NTG. We discussed when to call MD. Patient voices understanding. Patient has a goal to breathe better while doing ADLs. Will continue to encourage and support.  Kesha Hurrell L. Brown, MS, NASM, CES 

## 2011-12-11 ENCOUNTER — Encounter (HOSPITAL_COMMUNITY): Payer: Medicare Other

## 2011-12-16 ENCOUNTER — Encounter (HOSPITAL_COMMUNITY)
Admission: RE | Admit: 2011-12-16 | Discharge: 2011-12-16 | Disposition: A | Discharge status: Home / Self Care | Payer: Medicare Other | Source: Ambulatory Visit | Attending: Internal Medicine | Admitting: Internal Medicine

## 2011-12-18 ENCOUNTER — Encounter (HOSPITAL_COMMUNITY)
Admission: RE | Admit: 2011-12-18 | Discharge: 2011-12-18 | Disposition: A | Discharge status: Home / Self Care | Payer: Medicare Other | Source: Ambulatory Visit | Attending: Internal Medicine | Admitting: Internal Medicine

## 2011-12-23 ENCOUNTER — Encounter (HOSPITAL_COMMUNITY): Payer: Medicare Other

## 2011-12-23 ENCOUNTER — Telehealth: Payer: Self-pay | Admitting: *Deleted

## 2011-12-23 NOTE — Telephone Encounter (Signed)
Pt called c/o nose bleed-no more infor-called pt back with no answere--Left message on machine To return call

## 2011-12-23 NOTE — Telephone Encounter (Signed)
Stop flonase and no OTC meds per Dr Cato Mulligan

## 2011-12-24 NOTE — Telephone Encounter (Signed)
Pt aware.

## 2011-12-25 ENCOUNTER — Encounter (HOSPITAL_COMMUNITY)
Admission: RE | Admit: 2011-12-25 | Discharge: 2011-12-25 | Disposition: A | Discharge status: Home / Self Care | Payer: Medicare Other | Source: Ambulatory Visit | Attending: Internal Medicine | Admitting: Internal Medicine

## 2011-12-30 ENCOUNTER — Encounter (HOSPITAL_COMMUNITY): Payer: Medicare Other

## 2011-12-31 ENCOUNTER — Encounter: Payer: Self-pay | Admitting: Internal Medicine

## 2011-12-31 ENCOUNTER — Ambulatory Visit (INDEPENDENT_AMBULATORY_CARE_PROVIDER_SITE_OTHER): Payer: Medicare Other | Admitting: Internal Medicine

## 2011-12-31 VITALS — BP 130/74 | Temp 98.0°F | Wt 193.0 lb

## 2011-12-31 DIAGNOSIS — J449 Chronic obstructive pulmonary disease, unspecified: Secondary | ICD-10-CM

## 2011-12-31 DIAGNOSIS — R300 Dysuria: Secondary | ICD-10-CM

## 2011-12-31 DIAGNOSIS — I10 Essential (primary) hypertension: Secondary | ICD-10-CM

## 2011-12-31 LAB — POCT URINALYSIS DIPSTICK
Bilirubin, UA: NEGATIVE
Glucose, UA: NEGATIVE
Urobilinogen, UA: 0.2

## 2011-12-31 MED ORDER — CIPROFLOXACIN HCL 500 MG PO TABS: 500.0000 mg | ORAL_TABLET | Freq: Two times a day (BID) | ORAL | Status: AC

## 2011-12-31 NOTE — Patient Instructions (Signed)
Drink as much fluid as you  can tolerate over the next few days  Take your antibiotic as prescribed until ALL of it is gone, but stop if you develop a rash, swelling, or any side effects of the medication.  Contact our office as soon as possible if  there are side effects of the medication.  Return in 4 months for follow-up  Pulmonary followup as scheduled

## 2011-12-31 NOTE — Progress Notes (Signed)
  Subjective:    Patient ID: Pamela Hampton, female    DOB: [birth year], 71 y.o.   MRN: [id number]  HPI 71 year old patient who is seen today for followup. Medical problems include treated hypertension and COPD. She is on chronic oxygen therapy. She presents with a chief complaint of burning dysuria and frequency over the past few days. No fever chills or flank pain   Review of Systems  Constitutional: Negative for fever, chills and fatigue.  Genitourinary: Positive for dysuria and frequency. Negative for flank pain.       Objective:   Physical Exam  Constitutional: She appears well-developed and well-nourished. No distress.       No distress. Wheelchair-bound. Blood pressure normal. O2 saturation 91%.  Pulmonary/Chest:       Diminished breath sounds          Assessment & Plan:   Acute UTI. Will treat with Cipro for 3 days COPD Hypertension  Continue pulmonary rehabilitation Return office visit 4 months Pulmonary followup as scheduled

## 2012-01-01 ENCOUNTER — Encounter (HOSPITAL_COMMUNITY): Payer: Medicare Other

## 2012-01-05 ENCOUNTER — Other Ambulatory Visit: Payer: Self-pay | Admitting: *Deleted

## 2012-01-05 MED ORDER — ESCITALOPRAM OXALATE 10 MG PO TABS: 10.0000 mg | ORAL_TABLET | Freq: Every day | ORAL | Status: DC

## 2012-01-06 ENCOUNTER — Encounter (HOSPITAL_COMMUNITY): Payer: Medicare Other

## 2012-01-08 ENCOUNTER — Encounter (HOSPITAL_COMMUNITY): Payer: Medicare Other

## 2012-01-13 ENCOUNTER — Encounter (HOSPITAL_COMMUNITY): Payer: Medicare Other

## 2012-01-15 ENCOUNTER — Other Ambulatory Visit: Payer: Self-pay | Admitting: Internal Medicine

## 2012-01-15 ENCOUNTER — Encounter (HOSPITAL_COMMUNITY)
Admission: RE | Admit: 2012-01-15 | Discharge: 2012-01-15 | Disposition: A | Discharge status: Home / Self Care | Payer: Medicare Other | Source: Ambulatory Visit | Attending: Internal Medicine | Admitting: Internal Medicine

## 2012-01-15 DIAGNOSIS — J4489 Other specified chronic obstructive pulmonary disease: Secondary | ICD-10-CM | POA: Insufficient documentation

## 2012-01-15 DIAGNOSIS — E785 Hyperlipidemia, unspecified: Secondary | ICD-10-CM | POA: Insufficient documentation

## 2012-01-15 DIAGNOSIS — J449 Chronic obstructive pulmonary disease, unspecified: Secondary | ICD-10-CM | POA: Insufficient documentation

## 2012-01-15 DIAGNOSIS — I10 Essential (primary) hypertension: Secondary | ICD-10-CM | POA: Insufficient documentation

## 2012-01-15 DIAGNOSIS — K219 Gastro-esophageal reflux disease without esophagitis: Secondary | ICD-10-CM | POA: Insufficient documentation

## 2012-01-15 DIAGNOSIS — Z5189 Encounter for other specified aftercare: Secondary | ICD-10-CM | POA: Insufficient documentation

## 2012-01-15 MED ORDER — TIOTROPIUM BROMIDE MONOHYDRATE 18 MCG IN CAPS: 18.0000 ug | ORAL_CAPSULE | Freq: Every day | RESPIRATORY_TRACT | Status: DC

## 2012-01-15 MED ORDER — LOSARTAN POTASSIUM 25 MG PO TABS: 25.0000 mg | ORAL_TABLET | Freq: Every day | ORAL | Status: DC

## 2012-01-15 NOTE — Telephone Encounter (Signed)
Last ov with MR [id number] Received refill request from [facility] for losartan 25mg  and spiriva.  Pt changed to losartan 25mg  at 07-07-11 ov from lisinopril.  Will fill the losartan 25mg  x1 with a note to pharmacy to defer future refills to PCP.  spiriva refilled.

## 2012-01-20 ENCOUNTER — Telehealth: Payer: Self-pay | Admitting: Internal Medicine

## 2012-01-20 ENCOUNTER — Encounter (HOSPITAL_COMMUNITY)
Admission: RE | Admit: 2012-01-20 | Discharge: 2012-01-20 | Disposition: A | Discharge status: Home / Self Care | Payer: Medicare Other | Source: Ambulatory Visit | Attending: Internal Medicine | Admitting: Internal Medicine

## 2012-01-20 NOTE — Telephone Encounter (Signed)
Pt called and is req to change pcps from Dr Cato Mulligan to Dr Amador Cunas, due to availability issues. Pls advise if ok?

## 2012-01-20 NOTE — Telephone Encounter (Signed)
Okay 

## 2012-01-21 NOTE — Telephone Encounter (Signed)
Still waiting on reply from Dr Cato Mulligan.

## 2012-01-22 ENCOUNTER — Encounter (HOSPITAL_COMMUNITY): Payer: Medicare Other

## 2012-01-27 ENCOUNTER — Encounter (HOSPITAL_COMMUNITY)
Admission: RE | Admit: 2012-01-27 | Discharge: 2012-01-27 | Disposition: A | Discharge status: Home / Self Care | Payer: Medicare Other | Source: Ambulatory Visit | Attending: Internal Medicine | Admitting: Internal Medicine

## 2012-01-27 DIAGNOSIS — I10 Essential (primary) hypertension: Secondary | ICD-10-CM | POA: Insufficient documentation

## 2012-01-27 DIAGNOSIS — Z5189 Encounter for other specified aftercare: Secondary | ICD-10-CM | POA: Insufficient documentation

## 2012-01-27 DIAGNOSIS — K219 Gastro-esophageal reflux disease without esophagitis: Secondary | ICD-10-CM | POA: Insufficient documentation

## 2012-01-27 DIAGNOSIS — J449 Chronic obstructive pulmonary disease, unspecified: Secondary | ICD-10-CM | POA: Insufficient documentation

## 2012-01-27 DIAGNOSIS — E785 Hyperlipidemia, unspecified: Secondary | ICD-10-CM | POA: Insufficient documentation

## 2012-01-27 DIAGNOSIS — J4489 Other specified chronic obstructive pulmonary disease: Secondary | ICD-10-CM | POA: Insufficient documentation

## 2012-01-27 NOTE — Telephone Encounter (Signed)
ok 

## 2012-01-27 NOTE — Telephone Encounter (Signed)
Called Pamela Hampton and notified her that both doctors have agreed to change of pcp. Pts banner has been changed from Dr Cato Mulligan to Dr Amador Cunas as pcp, as noted.

## 2012-01-27 NOTE — Telephone Encounter (Signed)
Pamela Hampton, this one's for you. Thank you!

## 2012-01-29 ENCOUNTER — Encounter (HOSPITAL_COMMUNITY): Payer: Medicare Other

## 2012-02-03 ENCOUNTER — Other Ambulatory Visit: Payer: Self-pay | Admitting: *Deleted

## 2012-02-03 ENCOUNTER — Encounter (HOSPITAL_COMMUNITY)
Admission: RE | Admit: 2012-02-03 | Discharge: 2012-02-03 | Disposition: A | Discharge status: Home / Self Care | Payer: Medicare Other | Source: Ambulatory Visit | Attending: Internal Medicine | Admitting: Internal Medicine

## 2012-02-04 ENCOUNTER — Other Ambulatory Visit: Payer: Self-pay | Admitting: *Deleted

## 2012-02-04 MED ORDER — LOSARTAN POTASSIUM 25 MG PO TABS: 25.0000 mg | ORAL_TABLET | Freq: Every day | ORAL | Status: DC

## 2012-02-05 ENCOUNTER — Encounter (HOSPITAL_COMMUNITY)
Admission: RE | Admit: 2012-02-05 | Discharge: 2012-02-05 | Disposition: A | Discharge status: Home / Self Care | Payer: Medicare Other | Source: Ambulatory Visit | Attending: Internal Medicine | Admitting: Internal Medicine

## 2012-02-09 ENCOUNTER — Ambulatory Visit (INDEPENDENT_AMBULATORY_CARE_PROVIDER_SITE_OTHER): Payer: Medicare Other | Admitting: Internal Medicine

## 2012-02-09 ENCOUNTER — Encounter: Payer: Self-pay | Admitting: Internal Medicine

## 2012-02-09 VITALS — BP 128/80 | Temp 98.3°F | Wt 193.0 lb

## 2012-02-09 DIAGNOSIS — I10 Essential (primary) hypertension: Secondary | ICD-10-CM

## 2012-02-09 DIAGNOSIS — J449 Chronic obstructive pulmonary disease, unspecified: Secondary | ICD-10-CM

## 2012-02-09 DIAGNOSIS — N390 Urinary tract infection, site not specified: Secondary | ICD-10-CM

## 2012-02-09 LAB — POCT URINALYSIS DIPSTICK
Bilirubin, UA: NEGATIVE
Glucose, UA: NEGATIVE
Nitrite, UA: NEGATIVE
Spec Grav, UA: >=1.03
Urobilinogen, UA: 0.2

## 2012-02-09 MED ORDER — NITROFURANTOIN MACROCRYSTAL 25 MG PO CAPS: 50.0000 mg | ORAL_CAPSULE | Freq: Four times a day (QID) | ORAL | Status: AC

## 2012-02-09 NOTE — Patient Instructions (Addendum)
Take your antibiotic as prescribed until ALL of it is gone, but stop if you develop a rash, swelling, or any side effects of the medication.  Contact our office as soon as possible if  there are side effects of the medication.  Call or return to clinic prn if these symptoms worsen or fail to improve as anticipated.  

## 2012-02-09 NOTE — Progress Notes (Signed)
  Subjective:    Patient ID: Pamela Hampton, female    DOB: 05-Apr-1941, 71 y.o.   MRN: [id number]  HPI 71 year old patient who has a history of oxygen-dependent COPD. She has treated hypertension and dyslipidemia. She presents with a 2-3 day history of burning dysuria. She has had UTIs in the past. No known allergies. No fever chills or flank pain.    Review of Systems  Genitourinary: Positive for dysuria and frequency.       Objective:   Physical Exam  Constitutional: She is oriented to person, place, and time. She appears well-developed and well-nourished. No distress.  HENT:  Head: Normocephalic.  Right Ear: External ear normal.  Left Ear: External ear normal.  Mouth/Throat: Oropharynx is clear and moist.  Eyes: Conjunctivae and EOM are normal. Pupils are equal, round, and reactive to light.  Neck: Normal range of motion. Neck supple. No thyromegaly present.  Cardiovascular: Normal rate, regular rhythm, normal heart sounds and intact distal pulses.   Pulmonary/Chest: Effort normal.       Diminished breath sounds Nasal oxygen in place  Abdominal: Soft. Bowel sounds are normal. She exhibits no mass. There is no tenderness.  Musculoskeletal: Normal range of motion.  Lymphadenopathy:    She has no cervical adenopathy.  Neurological: She is alert and oriented to person, place, and time.  Skin: Skin is warm and dry. No rash noted.  Psychiatric: She has a normal mood and affect. Her behavior is normal.          Assessment & Plan:   Acute UTI. Will treat with antibiotic therapy COPD stable Hypertension stable

## 2012-02-10 ENCOUNTER — Encounter (HOSPITAL_COMMUNITY)
Admission: RE | Admit: 2012-02-10 | Discharge: 2012-02-10 | Disposition: A | Discharge status: Home / Self Care | Payer: Medicare Other | Source: Ambulatory Visit | Attending: Internal Medicine | Admitting: Internal Medicine

## 2012-02-12 ENCOUNTER — Encounter (HOSPITAL_COMMUNITY)
Admission: RE | Admit: 2012-02-12 | Discharge: 2012-02-12 | Disposition: A | Discharge status: Home / Self Care | Payer: Medicare Other | Source: Ambulatory Visit | Attending: Internal Medicine | Admitting: Internal Medicine

## 2012-02-17 ENCOUNTER — Encounter (HOSPITAL_COMMUNITY)
Admission: RE | Admit: 2012-02-17 | Discharge: 2012-02-17 | Disposition: A | Discharge status: Home / Self Care | Payer: Medicare Other | Source: Ambulatory Visit | Attending: Internal Medicine | Admitting: Internal Medicine

## 2012-02-19 ENCOUNTER — Encounter (HOSPITAL_COMMUNITY)
Admission: RE | Admit: 2012-02-19 | Discharge: 2012-02-19 | Disposition: A | Discharge status: Home / Self Care | Payer: Medicare Other | Source: Ambulatory Visit | Attending: Internal Medicine | Admitting: Internal Medicine

## 2012-02-24 ENCOUNTER — Encounter (HOSPITAL_COMMUNITY)
Admission: RE | Admit: 2012-02-24 | Discharge: 2012-02-24 | Disposition: A | Discharge status: Home / Self Care | Payer: Medicare Other | Source: Ambulatory Visit | Attending: Internal Medicine | Admitting: Internal Medicine

## 2012-02-26 ENCOUNTER — Encounter (HOSPITAL_COMMUNITY): Payer: Medicare Other

## 2012-03-02 ENCOUNTER — Encounter (HOSPITAL_COMMUNITY)
Admission: RE | Admit: 2012-03-02 | Discharge: 2012-03-02 | Disposition: A | Discharge status: Home / Self Care | Payer: Medicare Other | Source: Ambulatory Visit | Attending: Internal Medicine | Admitting: Internal Medicine

## 2012-03-02 DIAGNOSIS — J4489 Other specified chronic obstructive pulmonary disease: Secondary | ICD-10-CM | POA: Insufficient documentation

## 2012-03-02 DIAGNOSIS — I10 Essential (primary) hypertension: Secondary | ICD-10-CM | POA: Insufficient documentation

## 2012-03-02 DIAGNOSIS — K219 Gastro-esophageal reflux disease without esophagitis: Secondary | ICD-10-CM | POA: Insufficient documentation

## 2012-03-02 DIAGNOSIS — E785 Hyperlipidemia, unspecified: Secondary | ICD-10-CM | POA: Insufficient documentation

## 2012-03-02 DIAGNOSIS — J449 Chronic obstructive pulmonary disease, unspecified: Secondary | ICD-10-CM | POA: Insufficient documentation

## 2012-03-02 DIAGNOSIS — Z5189 Encounter for other specified aftercare: Secondary | ICD-10-CM | POA: Insufficient documentation

## 2012-03-04 ENCOUNTER — Encounter (HOSPITAL_COMMUNITY)
Admission: RE | Admit: 2012-03-04 | Discharge: 2012-03-04 | Disposition: A | Discharge status: Home / Self Care | Payer: Medicare Other | Source: Ambulatory Visit | Attending: Internal Medicine | Admitting: Internal Medicine

## 2012-03-09 ENCOUNTER — Encounter (HOSPITAL_COMMUNITY)
Admission: RE | Admit: 2012-03-09 | Discharge: 2012-03-09 | Disposition: A | Discharge status: Home / Self Care | Payer: Medicare Other | Source: Ambulatory Visit | Attending: Internal Medicine | Admitting: Internal Medicine

## 2012-03-11 ENCOUNTER — Encounter (HOSPITAL_COMMUNITY)
Admission: RE | Admit: 2012-03-11 | Discharge: 2012-03-11 | Disposition: A | Discharge status: Home / Self Care | Payer: Medicare Other | Source: Ambulatory Visit | Attending: Internal Medicine | Admitting: Internal Medicine

## 2012-03-15 ENCOUNTER — Telehealth: Payer: Self-pay | Admitting: Internal Medicine

## 2012-03-15 MED ORDER — CIPROFLOXACIN HCL 500 MG PO TABS: 500.0000 mg | ORAL_TABLET | Freq: Two times a day (BID) | ORAL | Status: AC

## 2012-03-15 NOTE — Telephone Encounter (Signed)
Please advise - call out abx or to see tomorrow- will be a double book appt

## 2012-03-15 NOTE — Telephone Encounter (Signed)
Pt aware rx sent to [facility]

## 2012-03-15 NOTE — Telephone Encounter (Signed)
cipro 500 #14 one BID 

## 2012-03-15 NOTE — Progress Notes (Signed)
Pulmonary Rehabilitation Program Outcomes Report   Orientation:  11/13/2011  Discharge Date:  03/11/2012 # of sessions completed: 24  Pulmonologist: Ramaswamy Class Time:  1:30pm  A.  Exercise Program:  Tolerates exercise @ 1.5 METS for 45 minutes, Walk Test Results:  Pre: 437ft and Post: 41ft, Improved functional capacity  10.00 %, No Change  muscular strength  0.00 %, Improved dyspnea score 57.14 %, Improved education score 33.33 %, Exercise limited by dyspnea, Discharged to home exercise program.  Anticipated compliance:  fair and Discharged  B.  Mental Health:  Health related anxiety and Quality of Life (QOL)  changes:  Overall  -2.25 %, Health/Functioning +0.14 %, Socioeconomics -3.57 %, Psych/Spiritual -12.12 %, Family +15.91 %    C.  Education/Instruction/Skills  Uses Perceived Exertion Scale and/or Dyspnea Scale and Attended 10 education classes  Demonstrates accurate diaphragmatic breathing and pursed lip breathing. Home exercise Given: 12/09/2011   D.  Nutrition/Weight Control/Body Composition:  Pt wt maintained during rehab.  BMI 34.7 Section Completed by: Mickle Plumb, M.Ed, RD, LDN, CDE    E.  Blood Lipids  Lab Results  Component Value Date   CHOL 142 10/23/2010   HDL 38.80* 10/23/2010   LDLDIRECT 73.9 10/23/2010   TRIG 201.0* 10/23/2010   CHOLHDL 4 10/23/2010   F.  Lifestyle Changes:  Making positive lifestyle changes  G.  Symptoms noted with exercise:  Shortness of breath, Dizziness, Fatigue and Exertional hypertension  Report Completed By:  Frederik Schmidt. Manson Passey, MS, NASM, CES  Comments:  Patient has completed the pulmonary rehab program. Patient tolerated well and improved with her PLB and work tolerance. All vital signs stable. Maintained good oxygen sats on 4L NCC.  Patient states "she will miss [place] and all we have done for her". Patient explained that this program helped her understand what COPD really meant and the progression of it.  Patient accomplished all program and personal goals. Great to work with and very compliant with her attendance.  Courtney L. Manson Passey, MS, NASM, CES      Agree with the above note Cathie Olden RN

## 2012-03-15 NOTE — Telephone Encounter (Signed)
Caller: [name]/Patient; Patient Name: Pamela Hampton; PCP: Eleonore Chiquito; Best Callback Phone Number: [phone]. Caller reports she was seen and treated for uti appx 2 weeks ago, 7/15 per EPIC record. Sxs resolved and 2-3 days ago, she developed urgency and freq, pressure and dysuria and low back pain. This am, blood noted in urine. Afebrile. Per Bloody Urine Protocol, Urinary Tract Symptoms and low back pain, See in 4 hrs Protocol. No open appts in EPIC. Caller advised a note will be sent re her being worked in this afternoon. She can be reached at above #. PLEASE CALL [facility] RE AN APPOINTMENT FOR THIS AFTERNOON.

## 2012-03-16 ENCOUNTER — Encounter (HOSPITAL_COMMUNITY): Payer: Medicare Other

## 2012-03-18 ENCOUNTER — Encounter (HOSPITAL_COMMUNITY): Payer: Medicare Other

## 2012-03-23 ENCOUNTER — Encounter (HOSPITAL_COMMUNITY): Payer: Medicare Other

## 2012-03-23 ENCOUNTER — Other Ambulatory Visit: Payer: Self-pay | Admitting: Internal Medicine

## 2012-03-25 ENCOUNTER — Encounter (HOSPITAL_COMMUNITY): Admission: RE | Admit: 2012-03-25 | Payer: Medicare Other | Source: Ambulatory Visit

## 2012-04-14 ENCOUNTER — Ambulatory Visit (INDEPENDENT_AMBULATORY_CARE_PROVIDER_SITE_OTHER): Payer: Medicare Other | Admitting: Internal Medicine

## 2012-04-14 ENCOUNTER — Encounter: Payer: Self-pay | Admitting: Internal Medicine

## 2012-04-14 VITALS — BP 138/62 | HR 82 | Temp 97.9°F | Ht 64.0 in | Wt 193.0 lb

## 2012-04-14 DIAGNOSIS — Z23 Encounter for immunization: Secondary | ICD-10-CM

## 2012-04-14 DIAGNOSIS — J449 Chronic obstructive pulmonary disease, unspecified: Secondary | ICD-10-CM

## 2012-04-14 MED ORDER — PREDNISONE 10 MG PO TABS: ORAL_TABLET | ORAL | Status: DC

## 2012-04-14 MED ORDER — DOXYCYCLINE HYCLATE 100 MG PO TABS: 100.0000 mg | ORAL_TABLET | Freq: Two times a day (BID) | ORAL | Status: DC

## 2012-04-14 NOTE — Progress Notes (Signed)
Subjective:    Patient ID: Pamela Hampton, female    DOB: 11-29-1940, 71 y.o.   MRN: [id number]  HPI  OV 05/29/11: 71 year old female. 60 pack smoker quit 2004. Frequent UTI. 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 > 30# weight gain since past 2 years  Carries diagnosis of copd. Seen by Dr Lesia Hausen 05/09/11 and referred here for copd with hypoxemia needing 4L O2. Patient main complaint is dyspnea. Dyspnea onset was in 2002 and diagnosed with copd at that time. Initially started on nocutrnal o2 at that time. This is chronic, insidious onset, and progressive past 5 months. Since 5 months using o2 all the time. Rates dyspnea as severe and sometimes associated with panic. Denies associated cough other than very mild occassional cough and mild white sputum. Has associated mild pedal edema on and off past few months. Last spirometry 2007 per hx: details not known. CT scan sinus Nov 2011: normal. CXR may 2008 - clear but large hiatal hernia +  Reports associated chest pain - describes it as ache in back of sternum for past 3-4 months. Insidious onset. Has gotten better past few months. More at rest. Less with exertion. Denies known associated CAD. Recollects chemical cardiac stress test 3 years ago which she thinks was normal  Noted she is on ace inhibitor.   REC You have moderate-severe copd  We will change your oxygen to portable system  Please continue symbicort  Start spiriva - take sample, script and show technique  Continue oxygen  Will make referral to pulmonary rehab  Have pneumonia vaccine  AT fu do alpha 1 test  STop lisinopril - can make you cough. Take losartan generic 25mg  once daily instead; take script  Returin in 2 months    OV 09/09/2011  FU COPD.   Was advised to continue symbicort and spiriva added at last visit. She is using o2 all the time. She has stopped lisinopril. PFT 07/06/12 showed Gold stage 2 copd. FEv1 1.2L/60% with 8%BD response. But still not feeling any better in  terms of dyspnea. FEels that there is thick mucus inside chest and unable to bring it out. IN terms of chest pain, this is resolved but she is concerned that dyspnea could be due to heart issues She is yet to hear from rehab and very concerned about it  Past, Family, Social reviewed: She is concerned about family hx and is expressing it again - cancer and mI. Mom - died of MI. Father died of stomach cancer. Brother died of throat cancer. Immediate Elder sister died of lung cancer. Baby brother died of brain cancer. Eldest sister recently diagnosed with lung cancer - prognosis of 4-5 months. She is asking about her odds for lung cancer (esp with her past hx of hysterectomy 1978, and had cancer in 1998 in spot of uterus - s/p XRT). Very keen to know this and wants any test possible for it.  #COPD  - You have moderate-severe copd  - continue oxygen portable system, symbicort and spiriva  - have asked pulmonary rehab why they have not contacted you; once I find out will call you back  - will ensure your pneumonia vaccine and flu vaccine status is updated  - today or at followup do alpha 1 test  #BP  - conitnue losartan generic 25mg  once daily  #Shortness of breath  - will refer you to cardiology given concern that some of your shortness of breath might be due to heart issue  #  Cancer risk and screening  - will do CT chest without contrast (low radiation dose protocol) for lung cancer screening; might need insurance approval  #Followup  Returin in 2 months  OV 11/17/2011 Followup Moderate-Severe COPD, Lung cancer screen, and Hypertension.   bp 150 sbp - did not take med today  feels well but still class 3 dyspnea. Will start rehab tomorrow. No cough. Anxious to get better. SAw Dr Eden Emms cards feb 2013 - dyspnea thought to be due to pulm issues  Relieved ct has no cancer; personally reviewed CT Wants stroller with wheels through DME Wants aciphex changed to omeprazole script   ECHO 10/01/11: PASP  slightly elevated 47, Gr 1 diast dysfn  CT chest 09/16/11:  No lung mass is identified.  IMPRESSION:  1. Severe emphysema.  2. Large hiatal hernia.  3. Atherosclerosis.  Original Report Authenticated By: Dellia Cloud, M.D.   Past, Family, Social reviewed: no change since last visit  REC COPD and shortness of breath  - You have moderate-severe copd - Dr Eden Emms cardiology thinks is all due to lungs and fitness - continue oxygen portable system, symbicort and spiriva - start pulmonary rehab  - today or at followup do alpha  1 test  #BP - conitnue losartan generic 25mg  once daily   #Cancer risk and screening  - no lung cancer on CT chest feb 2013  - we will discuss feb 2014 if you need another CT chest   #Followup Returin in 4 months or sooner if needed   OV 04/14/2012  Son with her today - wants to understand her disease  Last 2 days incereased congestion, sputum volume, wheeze but no change in dyspnea or cough. Relates it to weather. Mild-mod in serverity. Progressive. Not had flu shot yet. CAT score is 21  Wants to know if she should get shingles vaccine   CAT COPD Symptom & Quality of Life Score ([employer] trademark) 0 is no burden. 5 is highest burden 04/14/2012   Never Cough -> Cough all the time 1  No phlegm in chest -> Chest is full of phlegm 5  No chest tightness -> Chest feels very tight 1  No dyspnea for 1 flight stairs/hill -> Very dyspneic for 1 flight of stairs 5  No limitations for ADL at home -> Very limited with ADL at home 3  Confident leaving home -> Not at all confident leaving home 0  Sleep soundly -> Do not sleep soundly because of lung condition 3  Lots of Energy -> No energy at all 3  TOTAL Score (max 40)  21   Past, Family, Social reviewed: UTI month ago and too antibioticsds   Review of Systems  Constitutional: Negative for fever, chills, diaphoresis, activity change, appetite change, fatigue and unexpected weight change.  HENT: Positive  for sore throat and postnasal drip. Negative for hearing loss, ear pain, nosebleeds, congestion, facial swelling, rhinorrhea, sneezing, mouth sores, trouble swallowing, neck pain, neck stiffness, dental problem, voice change, sinus pressure, tinnitus and ear discharge.   Eyes: Negative for photophobia, discharge, itching and visual disturbance.  Respiratory: Positive for shortness of breath and wheezing. Negative for apnea, cough, choking, chest tightness and stridor.   Cardiovascular: Positive for leg swelling. Negative for chest pain and palpitations.  Gastrointestinal: Negative for nausea, vomiting, abdominal pain, constipation, blood in stool and abdominal distention.  Genitourinary: Positive for hematuria ( Pt states 3 weeks ago severe bladder infx---blood in urine). Negative for dysuria, urgency, frequency, flank pain, decreased urine  volume and difficulty urinating.  Musculoskeletal: Positive for back pain ( vetebral problems). Negative for myalgias, joint swelling, arthralgias and gait problem.  Skin: Negative for color change, pallor and rash.  Neurological: Positive for dizziness. Negative for tremors, seizures, syncope, speech difficulty, weakness, light-headedness, numbness and headaches.  Hematological: Negative for adenopathy. Does not bruise/bleed easily.  Psychiatric/Behavioral: Negative for confusion, disturbed wake/sleep cycle and agitation. The patient is not nervous/anxious.        Objective:   Physical Exam  Body mass index is 31.58 kg/(m^2). Obese Looks well On o2 - wants prortable system Expiration prolonged. No distress.. No wheeze. No accessory muscle use. No crackles Normal percussion Normal S1S2+ Abd: obese, soft, no tenderness Ext: no cyanosis, no clubbing, no edema CNS: AxOX3. Movesl all4s. Speech normal       Assessment & Plan:

## 2012-04-14 NOTE — Patient Instructions (Addendum)
#  COPD and shortness of breath  - You have moderate-severe copd - Have flu shot today 04/14/2012 - Today 04/14/2012 there is a mild flare of the disease  - Take doxycycline 100mg po twice daily x 5 days; take after meals and avoid sunlight - Take prednisone 40 mg daily x 2 days, then 20mg daily x 2 days, then 10mg daily x 2 days, then 5mg daily x 2 days and stop - Next time you have copd acting up call us straight away - continue oxygen portable system, symbicort and spiriva -continue pulmonary rehab  - some potin at followup will do alpha  1 test    #Cancer risk and screening  - no lung cancer on CT chest feb 2013  - we will discuss feb 2014 if you need another CT chest   #Health maintenance  - flu shot today 04/14/2012  - The shingles vaccine is recommended for all people age > 60 per my reading. Please talk to [email or web address],PETER FRANK, MD your PMD aboutt his  #Followup  - 6 months or sooner if needed    

## 2012-04-15 ENCOUNTER — Other Ambulatory Visit: Payer: Self-pay

## 2012-04-15 MED ORDER — FLUTICASONE PROPIONATE 50 MCG/ACT NA SUSP: 2.0000 | Freq: Every day | NASAL | Status: DC | PRN

## 2012-04-15 MED ORDER — POTASSIUM CHLORIDE CRYS ER 20 MEQ PO TBCR: 20.0000 meq | EXTENDED_RELEASE_TABLET | Freq: Every day | ORAL | Status: DC | PRN

## 2012-04-15 MED ORDER — ALBUTEROL SULFATE HFA 108 (90 BASE) MCG/ACT IN AERS: 2.0000 | INHALATION_SPRAY | RESPIRATORY_TRACT | Status: DC | PRN

## 2012-04-18 NOTE — Assessment & Plan Note (Signed)
#  COPD and shortness of breath  - You have moderate-severe copd - Have flu shot today 04/14/2012 - Today 04/14/2012 there is a mild flare of the disease  - Take doxycycline 100mg  po twice daily x 5 days; take after meals and avoid sunlight - Take prednisone 40 mg daily x 2 days, then 20mg  daily x 2 days, then 10mg  daily x 2 days, then 5mg  daily x 2 days and stop - Next time you have copd acting up call us straight away - continue oxygen portable system, symbicort and spiriva -continue pulmonary rehab  - some potin at followup will do alpha  1 test    #Cancer risk and screening  - no lung cancer on CT chest feb 2013  - we will discuss feb 2014 if you need another CT chest   #Health maintenance  - flu shot today 04/14/2012  - The shingles vaccine is recommended for all people age > 60 per my reading. Please talk to Rogelia Boga, MD your PMD aboutt his  #Followup  - 6 months or sooner if needed

## 2012-04-19 ENCOUNTER — Ambulatory Visit (INDEPENDENT_AMBULATORY_CARE_PROVIDER_SITE_OTHER): Payer: Medicare Other | Admitting: Internal Medicine

## 2012-04-19 ENCOUNTER — Encounter: Payer: Self-pay | Admitting: Internal Medicine

## 2012-04-19 VITALS — BP 140/82 | Temp 98.4°F | Wt 190.0 lb

## 2012-04-19 DIAGNOSIS — Z23 Encounter for immunization: Secondary | ICD-10-CM

## 2012-04-19 DIAGNOSIS — I10 Essential (primary) hypertension: Secondary | ICD-10-CM

## 2012-04-19 DIAGNOSIS — Z0000 Encounter for general adult medical examination without abnormal findings: Secondary | ICD-10-CM

## 2012-04-19 DIAGNOSIS — J449 Chronic obstructive pulmonary disease, unspecified: Secondary | ICD-10-CM

## 2012-04-19 MED ORDER — ROSUVASTATIN CALCIUM 20 MG PO TABS: 20.0000 mg | ORAL_TABLET | ORAL | Status: DC

## 2012-04-19 NOTE — Progress Notes (Signed)
Subjective:    Patient ID: Pamela Hampton, female    DOB: [birth year], 71 y.o.   MRN: [id number]  HPI  71 year old patient who is seen today for followup. She has a history of hypertension and dyslipidemia. She is followed by pulmonary medicine for severe COPD. Her palmar status has improved since last week when she was evaluated and treated for an acute exacerbation. She is completing antibiotic therapy in a prednisone taper.  Past Medical History  Diagnosis Date  . Allergy     Rhinitis  . Depression   . GERD (gastroesophageal reflux disease)     Barrett's esophagus  . Hyperlipidemia   . Hypertension   . Osteoporosis   . Seizures   . Barrett's esophagus 07/19/2004  . Anemia   . Obesity   . Pneumonia   . Stroke   . Sleep apnea   . Hepatitis   . Arthritis   . Blood transfusion   . COPD (chronic obstructive pulmonary disease)     wears 3 liters of o2 continious  . Emphysema of lung   . Ulcer   . Vaginal cancer   . Hiatal hernia     History   Social History  . Marital Status: Divorced    Spouse Name: N/A    Number of Children: 4  . Years of Education: N/A   Occupational History  . disaled    Social History Main Topics  . Smoking status: Former Smoker -- 2.0 packs/day for 30 years    Types: Cigarettes    Quit date: 07/28/2002  . Smokeless tobacco: Never Used  . Alcohol Use: No  . Drug Use: No  . Sexually Active: Not on file   Other Topics Concern  . Not on file   Social History Narrative  . No narrative on file    Past Surgical History  Procedure Date  . Orif hip fracture     right  . Cholecystectomy   . Hemorrhoid surgery   . Total hip arthroplasty     right  . Abdominal hysterectomy   . 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 Abdominal laproscopy surgery     Family History  Problem Relation Age of Onset  . Heart disease Mother     MI  . Diabetes Mother   . Uterine cancer Mother   .  Anemia Mother   . Hypertension Mother   . Stomach cancer Father   . Esophageal cancer Father   . Hypertension Father   . Diabetes Sister   . Anemia Sister   . Esophageal cancer Brother   . Hypertension Brother   . Diabetes Son   . Diabetes Son   . Brain cancer Brother   . Lung cancer Sister   . Liver cancer Sister   . Colon cancer Neg Hx   . Rectal cancer Neg Hx     No Known Allergies  Current Outpatient Prescriptions on File Prior to Visit  Medication Sig Dispense Refill  . albuterol (PROAIR HFA) 108 (90 BASE) MCG/ACT inhaler Inhale 2 puffs into the lungs every 4 (four) hours as needed. For SOB  3 Inhaler  5  . budesonide-formoterol (SYMBICORT) 160-4.5 MCG/ACT inhaler Inhale 2 puffs into the lungs 2 (two) times daily.  1 Inhaler  12  . doxycycline (VIBRA-TABS) 100 MG tablet Take 1 tablet (100 mg total) by mouth 2 (two) times daily.  10 tablet  0  . escitalopram (LEXAPRO) 10  MG tablet Take 1 tablet (10 mg total) by mouth daily.  30 tablet  5  . Ferrous Sulfate Dried 200 (65 FE) MG TABS Take 200 mg by mouth daily.  30 tablet  6  . fluticasone (FLONASE) 50 MCG/ACT nasal spray Place 2 sprays into the nose daily as needed.  16 g  5  . ibandronate (BONIVA) 150 MG tablet Take in the morning with a full glass of water, on an empty stomach,  4 tablet  3  . losartan (COZAAR) 25 MG tablet Take 1 tablet (25 mg total) by mouth daily.  90 tablet  2  . omeprazole (PRILOSEC) 20 MG capsule Take 1 capsule (20 mg total) by mouth daily.  30 capsule  6  . potassium chloride SA (K-DUR,KLOR-CON) 20 MEQ tablet Take 1 tablet (20 mEq total) by mouth daily as needed.  30 tablet  5  . predniSONE (DELTASONE) 10 MG tablet Take 4 tablets daily for 2 days, then 2 tablets daily x 2 days, then 1 tablet daily x 2 days, then 1/2 tablet daily x 2 days, then stop.  15 tablet  0  . rosuvastatin (CRESTOR) 20 MG tablet Take 1 tablet (20 mg total) by mouth every other day.  30 tablet  5  . tiotropium (SPIRIVA HANDIHALER)  18 MCG inhalation capsule Place 1 capsule (18 mcg total) into inhaler and inhale daily.  30 capsule  6  . traZODone (DESYREL) 100 MG tablet TAKE 1 TABLET BY MOUTH AT BEDTIME.  90 tablet  1    BP 140/82  Temp 98.4 F (36.9 C) (Oral)  Wt 190 lb (86.183 kg)  SpO2 93%       Review of Systems  Constitutional: Negative.   HENT: Negative for hearing loss, congestion, sore throat, rhinorrhea, dental problem, sinus pressure and tinnitus.   Eyes: Negative for pain, discharge and visual disturbance.  Respiratory: Positive for shortness of breath. Negative for cough.   Cardiovascular: Negative for chest pain, palpitations and leg swelling.  Gastrointestinal: Negative for nausea, vomiting, abdominal pain, diarrhea, constipation, blood in stool and abdominal distention.  Genitourinary: Negative for dysuria, urgency, frequency, hematuria, flank pain, vaginal bleeding, vaginal discharge, difficulty urinating, vaginal pain and pelvic pain.  Musculoskeletal: Positive for gait problem. Negative for joint swelling and arthralgias.  Skin: Negative for rash.  Neurological: Negative for dizziness, syncope, speech difficulty, weakness, numbness and headaches.  Hematological: Negative for adenopathy.  Psychiatric/Behavioral: Negative for behavioral problems, dysphoric mood and agitation. The patient is not nervous/anxious.        Objective:   Physical Exam  Constitutional: She is oriented to person, place, and time. She appears well-developed and well-nourished.       No acute distress  Blood pressure 140/80  HENT:  Head: Normocephalic.  Right Ear: External ear normal.  Left Ear: External ear normal.  Mouth/Throat: Oropharynx is clear and moist.  Eyes: Conjunctivae normal and EOM are normal. Pupils are equal, round, and reactive to light.  Neck: Normal range of motion. Neck supple. No thyromegaly present.  Cardiovascular: Normal rate, regular rhythm, normal heart sounds and intact distal pulses.     Pulmonary/Chest: Effort normal. No respiratory distress. She has no wheezes. She has no rales.       Diminished breath sounds O2 saturation 94% on 4 L of oxygen per minute  Abdominal: Soft. Bowel sounds are normal. She exhibits no mass. There is no tenderness.  Musculoskeletal: Normal range of motion.  Lymphadenopathy:    She has no cervical  adenopathy.  Neurological: She is alert and oriented to person, place, and time.  Skin: Skin is warm and dry. No rash noted.  Psychiatric: She has a normal mood and affect. Her behavior is normal.          Assessment & Plan:   COPD. Medicines unchanged and refill Hypertension well controlled Dyslipidemia Crestor refilled samples provided  Followup pulmonary Return here 6 months or as needed

## 2012-04-19 NOTE — Patient Instructions (Signed)
Limit your sodium (Salt) intake  Continue pulmonary rehabilitation  Followup 6 months or as needed

## 2012-05-10 ENCOUNTER — Telehealth: Payer: Self-pay | Admitting: Internal Medicine

## 2012-05-10 MED ORDER — DOXYCYCLINE HYCLATE 100 MG PO TABS: 100.0000 mg | ORAL_TABLET | Freq: Two times a day (BID) | ORAL | Status: DC

## 2012-05-10 MED ORDER — PREDNISONE 10 MG PO TABS: ORAL_TABLET | ORAL | Status: DC

## 2012-05-10 NOTE — Telephone Encounter (Signed)
Take doxycycline 100mg  po twice daily x 5 days; take after meals and avoid sunlight  Please take Take prednisone 40mg  once daily x 3 days, then 30mg  once daily x 3 days, then 20mg  once daily x 3 days, then prednisone 10mg  once daily  x 3 days and stop or go to baseline pred dose if patient is on it

## 2012-05-10 NOTE — Telephone Encounter (Signed)
Last OV 04-14-12 for COPD. Pt is c/o having increased productive cough with clear phlegm, chest tightness, and increased SOB since Saturday. Pt denies any fever, wheezing, or change in color of phlegm at this time. Please advise.Pamela Hampton, CMA No Known Allergies

## 2012-05-10 NOTE — Telephone Encounter (Signed)
I spoke with pt and is aware of MR recs. I have sent RX into the pharmacy. Pt voiced her understanding of rx directions. Nothing further was needed

## 2012-05-19 ENCOUNTER — Other Ambulatory Visit: Payer: Self-pay | Admitting: *Deleted

## 2012-05-19 MED ORDER — OMEPRAZOLE 20 MG PO CPDR: 20.0000 mg | DELAYED_RELEASE_CAPSULE | Freq: Every day | ORAL | Status: DC

## 2012-07-07 ENCOUNTER — Other Ambulatory Visit: Payer: Self-pay | Admitting: *Deleted

## 2012-07-07 MED ORDER — ESCITALOPRAM OXALATE 10 MG PO TABS: 10.0000 mg | ORAL_TABLET | Freq: Every day | ORAL | Status: DC

## 2012-07-07 MED ORDER — ROSUVASTATIN CALCIUM 20 MG PO TABS: 20.0000 mg | ORAL_TABLET | ORAL | Status: DC

## 2012-07-07 NOTE — Telephone Encounter (Signed)
Received fax from pharmacy for refills for Crestor and Lexapro. Rx's sent to pharmacy.

## 2012-07-19 ENCOUNTER — Encounter: Payer: Self-pay | Admitting: Internal Medicine

## 2012-07-19 ENCOUNTER — Ambulatory Visit (INDEPENDENT_AMBULATORY_CARE_PROVIDER_SITE_OTHER): Payer: Medicare Other | Admitting: Internal Medicine

## 2012-07-19 VITALS — BP 110/60 | HR 95 | Temp 97.9°F | Resp 20 | Wt 190.0 lb

## 2012-07-19 DIAGNOSIS — E785 Hyperlipidemia, unspecified: Secondary | ICD-10-CM

## 2012-07-19 DIAGNOSIS — J449 Chronic obstructive pulmonary disease, unspecified: Secondary | ICD-10-CM

## 2012-07-19 DIAGNOSIS — I10 Essential (primary) hypertension: Secondary | ICD-10-CM

## 2012-07-19 MED ORDER — FUROSEMIDE 40 MG PO TABS: 40.0000 mg | ORAL_TABLET | Freq: Every day | ORAL | Status: DC

## 2012-07-19 MED ORDER — TOBRAMYCIN 0.3 % OP SOLN: 1.0000 [drp] | OPHTHALMIC | Status: DC

## 2012-07-19 NOTE — Progress Notes (Signed)
Subjective:    Patient ID: Pamela Hampton, female    DOB: 03/25/1941, 71 y.o.   MRN: [id number]  HPI  71 year old patient who has a history of advanced oxygen-dependent COPD. She is followed by pulmonary medicine. She presents with a one-week history of increasing lower extremity edema episodic epistaxis and more recently redness and excessive tearing from the left eye. No change in her pulmonary status. No significant shortness of breath or wheezing. She is on inhalational medications. Denies any fever or sputum production. No change in chronic dyspnea  Past Medical History  Diagnosis Date  . Allergy     Rhinitis  . Depression   . GERD (gastroesophageal reflux disease)     Barrett's esophagus  . Hyperlipidemia   . Hypertension   . Osteoporosis   . Seizures   . Barrett's esophagus 07/19/2004  . Anemia   . Obesity   . Pneumonia   . Stroke   . Sleep apnea   . Hepatitis   . Arthritis   . Blood transfusion   . COPD (chronic obstructive pulmonary disease)     wears 3 liters of o2 continious  . Emphysema of lung   . Ulcer   . Vaginal cancer   . Hiatal hernia     History   Social History  . Marital Status: Divorced    Spouse Name: N/A    Number of Children: 4  . Years of Education: N/A   Occupational History  . disaled    Social History Main Topics  . Smoking status: Former Smoker -- 2.0 packs/day for 30 years    Types: Cigarettes    Quit date: 07/28/2002  . Smokeless tobacco: Never Used  . Alcohol Use: No  . Drug Use: No  . Sexually Active: Not on file   Other Topics Concern  . Not on file   Social History Narrative  . No narrative on file    Past Surgical History  Procedure Date  . Orif hip fracture     right  . Cholecystectomy   . Hemorrhoid surgery   . Total hip arthroplasty     right  . Abdominal hysterectomy   . 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 Abdominal laproscopy surgery      Family History  Problem Relation Age of Onset  . Heart disease Mother     MI  . Diabetes Mother   . Uterine cancer Mother   . Anemia Mother   . Hypertension Mother   . Stomach cancer Father   . Esophageal cancer Father   . Hypertension Father   . Diabetes Sister   . Anemia Sister   . Esophageal cancer Brother   . Hypertension Brother   . Diabetes Son   . Diabetes Son   . Brain cancer Brother   . Lung cancer Sister   . Liver cancer Sister   . Colon cancer Neg Hx   . Rectal cancer Neg Hx     No Known Allergies  Current Outpatient Prescriptions on File Prior to Visit  Medication Sig Dispense Refill  . albuterol (PROAIR HFA) 108 (90 BASE) MCG/ACT inhaler Inhale 2 puffs into the lungs every 4 (four) hours as needed. For SOB  3 Inhaler  5  . budesonide-formoterol (SYMBICORT) 160-4.5 MCG/ACT inhaler Inhale 2 puffs into the lungs 2 (two) times daily.  1 Inhaler  12  . doxycycline (VIBRA-TABS) 100 MG tablet Take 1 tablet (100 mg  total) by mouth 2 (two) times daily.  10 tablet  0  . escitalopram (LEXAPRO) 10 MG tablet Take 1 tablet (10 mg total) by mouth daily.  30 tablet  1  . Ferrous Sulfate Dried 200 (65 FE) MG TABS Take 200 mg by mouth daily.  30 tablet  6  . fluticasone (FLONASE) 50 MCG/ACT nasal spray Place 2 sprays into the nose daily as needed.  16 g  5  . ibandronate (BONIVA) 150 MG tablet Take in the morning with a full glass of water, on an empty stomach,  4 tablet  3  . losartan (COZAAR) 25 MG tablet Take 1 tablet (25 mg total) by mouth daily.  90 tablet  2  . omeprazole (PRILOSEC) 20 MG capsule Take 1 capsule (20 mg total) by mouth daily.  30 capsule  6  . potassium chloride SA (K-DUR,KLOR-CON) 20 MEQ tablet Take 1 tablet (20 mEq total) by mouth daily as needed.  30 tablet  5  . predniSONE (DELTASONE) 10 MG tablet Take 40 mg daily x 3 days, 30 mg daily x 3 days, 20 mg daily x 3 days, 10 mg daily x 3 days then stop  30 tablet  0  . rosuvastatin (CRESTOR) 20 MG tablet  Take 1 tablet (20 mg total) by mouth every other day.  30 tablet  1  . tiotropium (SPIRIVA HANDIHALER) 18 MCG inhalation capsule Place 1 capsule (18 mcg total) into inhaler and inhale daily.  30 capsule  6  . traZODone (DESYREL) 100 MG tablet TAKE 1 TABLET BY MOUTH AT BEDTIME.  90 tablet  1    BP 110/60  Pulse 95  Temp 97.9 F (36.6 C) (Oral)  Resp 20  Wt 190 lb (86.183 kg)  SpO2 83%       Review of Systems  Constitutional: Positive for fatigue.  HENT: Positive for nosebleeds and congestion. Negative for hearing loss, sore throat, rhinorrhea, dental problem, sinus pressure and tinnitus.   Eyes: Positive for discharge, redness and itching. Negative for pain and visual disturbance.  Respiratory: Positive for shortness of breath. Negative for cough.   Cardiovascular: Positive for leg swelling. Negative for chest pain and palpitations.  Gastrointestinal: Negative for nausea, vomiting, abdominal pain, diarrhea, constipation, blood in stool and abdominal distention.  Genitourinary: Negative for dysuria, urgency, frequency, hematuria, flank pain, vaginal bleeding, vaginal discharge, difficulty urinating, vaginal pain and pelvic pain.  Musculoskeletal: Negative for joint swelling, arthralgias and gait problem.  Skin: Negative for rash.  Neurological: Negative for dizziness, syncope, speech difficulty, weakness, numbness and headaches.  Hematological: Negative for adenopathy.  Psychiatric/Behavioral: Negative for behavioral problems, dysphoric mood and agitation. The patient is not nervous/anxious.        Objective:   Physical Exam  Constitutional: She is oriented to person, place, and time. She appears well-developed and well-nourished.  HENT:  Head: Normocephalic.  Right Ear: External ear normal.  Left Ear: External ear normal.  Mouth/Throat: Oropharynx is clear and moist.       Left eye conjunctival injection Nasal mucosa swelling  Eyes: Conjunctivae normal and EOM are normal.  Pupils are equal, round, and reactive to light.  Neck: Normal range of motion. Neck supple. No thyromegaly present.  Cardiovascular: Normal rate, regular rhythm, normal heart sounds and intact distal pulses.   Pulmonary/Chest: Effort normal.       Scattered coarse rhonchi. No active wheezing. No increased work of breathing O2 saturation 91%  Abdominal: Soft. Bowel sounds are normal. She exhibits no mass.  There is no tenderness.  Musculoskeletal: Normal range of motion. She exhibits edema.       +2 edema  Lymphadenopathy:    She has no cervical adenopathy.  Neurological: She is alert and oriented to person, place, and time.  Skin: Skin is warm and dry. No rash noted.  Psychiatric: She has a normal mood and affect. Her behavior is normal.          Assessment & Plan:   COPD Pedal edema we'll restrict salt and treat with short-term diuretic therapy Conjunctivitis. We'll treat with the Tobrex ophthalmic drops Hypertension stable  Recheck 3 months

## 2012-07-19 NOTE — Patient Instructions (Signed)
Limit your sodium (Salt) intake  Furosemide 40 mg daily until swelling has resolved then used as needed only  Keep legs elevated as much as possible  Return in 3 months for follow-up

## 2012-07-20 ENCOUNTER — Emergency Department (HOSPITAL_COMMUNITY): Payer: Medicare Other

## 2012-07-20 ENCOUNTER — Encounter (HOSPITAL_COMMUNITY): Payer: Self-pay | Admitting: Emergency Medicine

## 2012-07-20 ENCOUNTER — Emergency Department (HOSPITAL_COMMUNITY)
Admission: EM | Admit: 2012-07-20 | Discharge: 2012-07-21 | Disposition: A | Discharge status: Home / Self Care | Payer: Medicare Other | Attending: Emergency Medicine | Admitting: Emergency Medicine

## 2012-07-20 DIAGNOSIS — K219 Gastro-esophageal reflux disease without esophagitis: Secondary | ICD-10-CM | POA: Insufficient documentation

## 2012-07-20 DIAGNOSIS — Z79899 Other long term (current) drug therapy: Secondary | ICD-10-CM | POA: Insufficient documentation

## 2012-07-20 DIAGNOSIS — G4730 Sleep apnea, unspecified: Secondary | ICD-10-CM | POA: Insufficient documentation

## 2012-07-20 DIAGNOSIS — E876 Hypokalemia: Secondary | ICD-10-CM

## 2012-07-20 DIAGNOSIS — Z8719 Personal history of other diseases of the digestive system: Secondary | ICD-10-CM | POA: Insufficient documentation

## 2012-07-20 DIAGNOSIS — D649 Anemia, unspecified: Secondary | ICD-10-CM | POA: Insufficient documentation

## 2012-07-20 DIAGNOSIS — Z8701 Personal history of pneumonia (recurrent): Secondary | ICD-10-CM | POA: Insufficient documentation

## 2012-07-20 DIAGNOSIS — E785 Hyperlipidemia, unspecified: Secondary | ICD-10-CM | POA: Insufficient documentation

## 2012-07-20 DIAGNOSIS — J441 Chronic obstructive pulmonary disease with (acute) exacerbation: Secondary | ICD-10-CM | POA: Insufficient documentation

## 2012-07-20 DIAGNOSIS — Z8739 Personal history of other diseases of the musculoskeletal system and connective tissue: Secondary | ICD-10-CM | POA: Insufficient documentation

## 2012-07-20 DIAGNOSIS — I10 Essential (primary) hypertension: Secondary | ICD-10-CM | POA: Insufficient documentation

## 2012-07-20 DIAGNOSIS — Z8709 Personal history of other diseases of the respiratory system: Secondary | ICD-10-CM | POA: Insufficient documentation

## 2012-07-20 DIAGNOSIS — Z8544 Personal history of malignant neoplasm of other female genital organs: Secondary | ICD-10-CM | POA: Insufficient documentation

## 2012-07-20 DIAGNOSIS — Z8673 Personal history of transient ischemic attack (TIA), and cerebral infarction without residual deficits: Secondary | ICD-10-CM | POA: Insufficient documentation

## 2012-07-20 DIAGNOSIS — Z87891 Personal history of nicotine dependence: Secondary | ICD-10-CM | POA: Insufficient documentation

## 2012-07-20 DIAGNOSIS — R0602 Shortness of breath: Secondary | ICD-10-CM | POA: Insufficient documentation

## 2012-07-20 DIAGNOSIS — E039 Hypothyroidism, unspecified: Secondary | ICD-10-CM | POA: Insufficient documentation

## 2012-07-20 DIAGNOSIS — E669 Obesity, unspecified: Secondary | ICD-10-CM | POA: Insufficient documentation

## 2012-07-20 LAB — CBC
HCT: 29.4 % — ABNORMAL LOW (ref 36.0–46.0)
MCH: 25.4 pg — ABNORMAL LOW (ref 26.0–34.0)
MCHC: 28.9 g/dL — ABNORMAL LOW (ref 30.0–36.0)
MCV: 88 fL (ref 78.0–100.0)
RDW: 17.5 % — ABNORMAL HIGH (ref 11.5–15.5)

## 2012-07-20 MED ORDER — IPRATROPIUM BROMIDE 0.02 % IN SOLN
0.5000 mg | Freq: Once | RESPIRATORY_TRACT | Status: AC
  Administered 2012-07-21: 0.5 mg via RESPIRATORY_TRACT
  Filled 2012-07-20: qty 2.5

## 2012-07-20 MED ORDER — PREDNISONE 20 MG PO TABS
60.0000 mg | ORAL_TABLET | Freq: Once | ORAL | Status: AC
  Administered 2012-07-21: 60 mg via ORAL
  Filled 2012-07-20: qty 3

## 2012-07-20 MED ORDER — ALBUTEROL SULFATE (5 MG/ML) 0.5% IN NEBU
5.0000 mg | INHALATION_SOLUTION | Freq: Once | RESPIRATORY_TRACT | Status: AC
  Administered 2012-07-21: 5 mg via RESPIRATORY_TRACT
  Filled 2012-07-20: qty 1

## 2012-07-20 MED ORDER — ALBUTEROL SULFATE (5 MG/ML) 0.5% IN NEBU
5.0000 mg | INHALATION_SOLUTION | Freq: Once | RESPIRATORY_TRACT | Status: AC
  Administered 2012-07-20: 5 mg via RESPIRATORY_TRACT
  Filled 2012-07-20: qty 1

## 2012-07-20 NOTE — ED Notes (Signed)
Bed:WA09<BR> Expected date:<BR> Expected time:<BR> Means of arrival:<BR> Comments:<BR> EMS

## 2012-07-20 NOTE — ED Notes (Signed)
Brought in by EMS from home with c/o SOB.  Per EMS pt woke up short of breath with her O2 tank depleted, pt reported that she is on continuous O2 for her COPD, family members let her walked down to a fire station before calling EMS.  Per EMS, pt's O2 sat was 83% on their arrival--- EMS administered O2 at 4LPM via [place] and Albuterol 5mg  neb tx--- pt's O2 sat went up to 98%.  Pt requested to be brought to ED for further evaluation---pt reports that she has been coughing for 3 days now, states she wants to be checked if she is having pneumonia.

## 2012-07-21 LAB — TROPONIN I: Troponin I: <0.3 ng/mL (ref <0.30)

## 2012-07-21 LAB — BASIC METABOLIC PANEL
BUN: 13 mg/dL (ref 6–23)
Calcium: 9.2 mg/dL (ref 8.4–10.5)
Chloride: 100 mEq/L (ref 96–112)
Creatinine, Ser: 0.85 mg/dL (ref 0.50–1.10)
GFR calc Af Amer: 79 mL/min — ABNORMAL LOW (ref >90)

## 2012-07-21 LAB — PRO B NATRIURETIC PEPTIDE: Pro B Natriuretic peptide (BNP): 523.8 pg/mL — ABNORMAL HIGH (ref 0–125)

## 2012-07-21 MED ORDER — POTASSIUM CHLORIDE CRYS ER 20 MEQ PO TBCR
40.0000 meq | EXTENDED_RELEASE_TABLET | Freq: Once | ORAL | Status: AC
  Administered 2012-07-21: 40 meq via ORAL
  Filled 2012-07-21: qty 2

## 2012-07-21 MED ORDER — PREDNISONE 20 MG PO TABS: ORAL_TABLET | ORAL | Status: DC

## 2012-07-21 MED ORDER — POTASSIUM CHLORIDE CRYS ER 20 MEQ PO TBCR: 20.0000 meq | EXTENDED_RELEASE_TABLET | Freq: Two times a day (BID) | ORAL | Status: DC

## 2012-07-21 MED ORDER — AMOXICILLIN 500 MG PO CAPS: 500.0000 mg | ORAL_CAPSULE | Freq: Three times a day (TID) | ORAL | Status: DC

## 2012-07-21 NOTE — ED Provider Notes (Addendum)
History     CSN: [id number]  Arrival date & time 07/20/12  2227   First MD Initiated Contact with Patient 07/20/12 2301      Chief Complaint  Patient presents with  . Shortness of Breath  . Cough    (Consider location/radiation/quality/duration/timing/severity/associated sxs/prior treatment) HPI  Patient reports she had regularly scheduled appointment yesterday, December 23 with her primary care doctor. She coincidentally had redness of her let eye  and today has developed redness of her right eye. She was started on eye ointment and she had a bloody nose over the weekend and has had swelling of her feet. She was devised to cut out the salt in her diet and elevate her feet. She reports today she was visiting family for the holidays and she ran out of her portable oxygen about 8 PM. She reports worsening shortness of breath and increasing cough. She reports her family was driving her home and they stopped at the fire department because of her shortness of breath. She states her cough is dry. She states she has wheezing and used her inhaler however but much earlier in the day. She denies any fever.  PCP Dr. Amador Cunas Pulmonologist Dr. Marchelle Gearing  Past Medical History  Diagnosis Date  . Allergy     Rhinitis  . Depression   . GERD (gastroesophageal reflux disease)     Barrett's esophagus  . Hyperlipidemia   . Hypertension   . Osteoporosis   . Seizures   . Barrett's esophagus 07/19/2004  . Anemia   . Obesity   . Pneumonia   . Stroke   . Sleep apnea   . Hepatitis   . Arthritis   . Blood transfusion   . COPD (chronic obstructive pulmonary disease)     wears 3 liters of o2 continious  . Emphysema of lung   . Ulcer   . Vaginal cancer   . Hiatal hernia     Past Surgical History  Procedure Date  . Orif hip fracture     right  . Cholecystectomy   . Hemorrhoid surgery   . Total hip arthroplasty     right  . Abdominal hysterectomy   . 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 Abdominal laproscopy surgery     Family History  Problem Relation Age of Onset  . Heart disease Mother     MI  . Diabetes Mother   . Uterine cancer Mother   . Anemia Mother   . Hypertension Mother   . Stomach cancer Father   . Esophageal cancer Father   . Hypertension Father   . Diabetes Sister   . Anemia Sister   . Esophageal cancer Brother   . Hypertension Brother   . Diabetes Son   . Diabetes Son   . Brain cancer Brother   . Lung cancer Sister   . Liver cancer Sister   . Colon cancer Neg Hx   . Rectal cancer Neg Hx     History  Substance Use Topics  . Smoking status: Former Smoker -- 2.0 packs/day for 30 years    Types: Cigarettes    Quit date: 07/28/2002  . Smokeless tobacco: Never Used  . Alcohol Use: No  Lives at home Lives alone Home oxygen 4 lpm [place]  OB History    Grav Para Term Preterm Abortions TAB SAB Ect Mult Living  Review of Systems  All other systems reviewed and are negative.    Allergies  Review of patient's allergies indicates no known allergies.  Home Medications   Current Outpatient Rx  Name  Route  Sig  Dispense  Refill  . ALBUTEROL SULFATE HFA 108 (90 BASE) MCG/ACT IN AERS   Inhalation   Inhale 2 puffs into the lungs every 4 (four) hours as needed. For SOB   3 Inhaler   5   . BUDESONIDE-FORMOTEROL FUMARATE 160-4.5 MCG/ACT IN AERO   Inhalation   Inhale 2 puffs into the lungs 2 (two) times daily. scheduled         . ESCITALOPRAM OXALATE 10 MG PO TABS   Oral   Take 1 tablet (10 mg total) by mouth daily.   30 tablet   1   . FERROUS SULFATE DRIED 200 (65 FE) MG PO TABS   Oral   Take 200 mg by mouth daily.   30 tablet   6   . FLUTICASONE PROPIONATE 50 MCG/ACT NA SUSP   Nasal   Place 2 sprays into the nose daily as needed. For nasal drainage         . FUROSEMIDE 40 MG PO TABS   Oral   Take 1 tablet (40 mg total) by mouth daily.   30  tablet   3   . IBANDRONATE SODIUM 150 MG PO TABS      Take in the morning with a full glass of water, on an empty stomach,   4 tablet   3   . LOSARTAN POTASSIUM 25 MG PO TABS   Oral   Take 1 tablet (25 mg total) by mouth daily.   90 tablet   2     Please defer future refills to pt's PCP, thanks.   . OMEPRAZOLE 20 MG PO CPDR   Oral   Take 1 capsule (20 mg total) by mouth daily.   30 capsule   6   . POTASSIUM CHLORIDE CRYS ER 20 MEQ PO TBCR   Oral   Take 20 mEq by mouth daily.         Marland Kitchen ROSUVASTATIN CALCIUM 20 MG PO TABS   Oral   Take 1 tablet (20 mg total) by mouth every other day.   30 tablet   1   . TIOTROPIUM BROMIDE MONOHYDRATE 18 MCG IN CAPS   Inhalation   Place 1 capsule (18 mcg total) into inhaler and inhale daily.   30 capsule   6   . TOBRAMYCIN SULFATE 0.3 % OP SOLN   Left Eye   Place 1 drop into the left eye every 4 (four) hours.   5 mL   0   . TRAZODONE HCL 100 MG PO TABS   Oral   Take 100 mg by mouth at bedtime as needed. For sleep           BP 139/63  Pulse 97  Temp 98.6 F (37 C) (Oral)  Resp 13  SpO2 92%  Vital signs normal    Physical Exam  Nursing note and vitals reviewed. Constitutional: She is oriented to person, place, and time. She appears well-developed and well-nourished.  Non-toxic appearance. She does not appear ill. No distress.  HENT:  Head: Normocephalic and atraumatic.  Right Ear: External ear normal.  Left Ear: External ear normal.  Nose: Nose normal. No mucosal edema or rhinorrhea.  Mouth/Throat: Oropharynx is clear and moist and mucous membranes are normal. No dental abscesses or  uvula swelling.  Eyes: Conjunctivae normal and EOM are normal. Pupils are equal, round, and reactive to light.  Neck: Normal range of motion and full passive range of motion without pain. Neck supple.  Cardiovascular: Normal rate, regular rhythm and normal heart sounds.  Exam reveals no gallop and no friction rub.   No murmur  heard. Pulmonary/Chest: She is in respiratory distress. She has decreased breath sounds. She has wheezes. She has rhonchi. She has no rales. She exhibits no tenderness and no crepitus.  Abdominal: Soft. Normal appearance and bowel sounds are normal. She exhibits no distension. There is no tenderness. There is no rebound and no guarding.  Musculoskeletal: Normal range of motion. She exhibits no edema and no tenderness.       Moves all extremities well.   Neurological: She is alert and oriented to person, place, and time. She has normal strength. No cranial nerve deficit.  Skin: Skin is warm, dry and intact. No rash noted. No erythema. No pallor.  Psychiatric: She has a normal mood and affect. Her speech is normal and behavior is normal. Her mood appears not anxious.    ED Course  Procedures (including critical care time)  Medications  albuterol (PROVENTIL) (5 MG/ML) 0.5% nebulizer solution 5 mg (5 mg Nebulization Given 07/20/12 2319)  albuterol (PROVENTIL) (5 MG/ML) 0.5% nebulizer solution 5 mg (5 mg Nebulization Given 07/21/12 0004)  ipratropium (ATROVENT) nebulizer solution 0.5 mg (0.5 mg Nebulization Given 07/21/12 0004)  predniSONE (DELTASONE) tablet 60 mg (60 mg Oral Given 07/21/12 0003)  potassium chloride SA (K-DUR,KLOR-CON) CR tablet 40 mEq (40 mEq Oral Given 07/21/12 0051)    Recheck 00:30 has improved air movement, no wheezing but has rhonchi and is still coughing.   States she is feeling better.   Results for orders placed during the hospital encounter of 07/20/12  CBC      Component Value Range   WBC 13.9 (*) 4.0 - 10.5 K/uL   RBC 3.34 (*) 3.87 - 5.11 MIL/uL   Hemoglobin 8.5 (*) 12.0 - 15.0 g/dL   HCT 46.9 (*) 62.9 - 52.8 %   MCV 88.0  78.0 - 100.0 fL   MCH 25.4 (*) 26.0 - 34.0 pg   MCHC 28.9 (*) 30.0 - 36.0 g/dL   RDW 41.3 (*) 24.4 - 01.0 %   Platelets 384  150 - 400 K/uL  BASIC METABOLIC PANEL      Component Value Range   Sodium 141  135 - 145 mEq/L   Potassium  3.1 (*) 3.5 - 5.1 mEq/L   Chloride 100  96 - 112 mEq/L   CO2 31  19 - 32 mEq/L   Glucose, Bld 135 (*) 70 - 99 mg/dL   BUN 13  6 - 23 mg/dL   Creatinine, Ser 2.72  0.50 - 1.10 mg/dL   Calcium 9.2  8.4 - 53.6 mg/dL   GFR calc non Af Amer 68 (*) >90 mL/min   GFR calc Af Amer 79 (*) >90 mL/min  TROPONIN I      Component Value Range   Troponin I <0.30  <0.30 ng/mL  PRO B NATRIURETIC PEPTIDE      Component Value Range   Pro B Natriuretic peptide (BNP) 523.8 (*) 0 - 125 pg/mL   Laboratory interpretation all normal except hypokalemia, stable anemia, leukocytosis   Dg Chest 2 View  07/20/2012  *RADIOLOGY REPORT*  Clinical Data: Shortness of breath and cough for 1 week  CHEST - 2 VIEW  Comparison: 12/18/2006  Findings: Enlargement of cardiac silhouette. Atherosclerotic calcification aorta. Large hiatal hernia. Pulmonary vascular congestion. Minimal bibasilar atelectasis. No acute infiltrate, pleural effusion or pneumothorax. Bones demineralized.  IMPRESSION: Enlargement of cardiac silhouette. Large hiatal hernia. Minimal bibasilar atelectasis.   Original Report Authenticated By: Ulyses Southward, M.D.      1. COPD with acute exacerbation    New Prescriptions   AMOXICILLIN (AMOXIL) 500 MG CAPSULE    Take 1 capsule (500 mg total) by mouth 3 (three) times daily.   POTASSIUM CHLORIDE SA (K-DUR,KLOR-CON) 20 MEQ TABLET    Take 1 tablet (20 mEq total) by mouth 2 (two) times daily.   PREDNISONE (DELTASONE) 20 MG TABLET    Take 3 po QD x 2d starting tomorrow, then 2 po QD x 3d then 1 po QD x 3d    Plan discharge  Devoria Albe, MD, FACEP    MDM          Ward Givens, MD 07/21/12 1610  Ward Givens, MD 08/13/12 1831

## 2012-07-27 ENCOUNTER — Encounter: Payer: Self-pay | Admitting: Internal Medicine

## 2012-07-30 ENCOUNTER — Other Ambulatory Visit: Payer: Self-pay | Admitting: *Deleted

## 2012-07-30 MED ORDER — TIOTROPIUM BROMIDE MONOHYDRATE 18 MCG IN CAPS: 18.0000 ug | ORAL_CAPSULE | Freq: Every day | RESPIRATORY_TRACT | Status: DC

## 2012-08-03 ENCOUNTER — Ambulatory Visit (INDEPENDENT_AMBULATORY_CARE_PROVIDER_SITE_OTHER): Payer: Medicare Other | Admitting: Internal Medicine

## 2012-08-03 ENCOUNTER — Other Ambulatory Visit: Payer: Medicare Other

## 2012-08-03 ENCOUNTER — Encounter: Payer: Self-pay | Admitting: Internal Medicine

## 2012-08-03 VITALS — BP 122/70 | HR 75 | Temp 98.2°F | Ht 64.0 in | Wt 195.6 lb

## 2012-08-03 DIAGNOSIS — J449 Chronic obstructive pulmonary disease, unspecified: Secondary | ICD-10-CM

## 2012-08-03 DIAGNOSIS — Z129 Encounter for screening for malignant neoplasm, site unspecified: Secondary | ICD-10-CM

## 2012-08-03 MED ORDER — BUDESONIDE-FORMOTEROL FUMARATE 160-4.5 MCG/ACT IN AERO: 2.0000 | INHALATION_SPRAY | Freq: Two times a day (BID) | RESPIRATORY_TRACT | Status: DC

## 2012-08-03 NOTE — Patient Instructions (Addendum)
#  COPD and shortness of breath  - You have moderate-severe copd - - Next time you have copd acting up call us straight away - continue oxygen portable system, symbicort and spiriva; CMA Will ensure refills esp of symbicort  - do not allow medications or oxygen to run out; this is why you are having flare up and ER vsitis -continue pulmonary exercises; have ordered home PT to see if they can help you  - consider LIFE ALERT systems for home monitoring  - TOday 08/03/2012 will do alpha  1 test    #Cancer risk and screening  - no lung cancer on CT chest feb 2013  - we will reivew summer 2014 about repeat CT chest for lung cancer screen; we are not doing it in jan/feb 2014 (the 1 year time point) due to cost    #Followup  - 6 months or sooner if needed

## 2012-08-03 NOTE — Progress Notes (Signed)
Subjective:    Patient ID: Pamela Hampton, female    DOB: 05/01/1941, 72 y.o.   MRN: [id number]  HPI OV 05/29/11: 72 year old female. 60 pack smoker quit 2004. Frequent UTI. 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 > 30# weight gain since past 2 years  Carries diagnosis of copd. Seen by Dr Lesia Hausen 05/09/11 and referred here for copd with hypoxemia needing 4L O2. Patient main complaint is dyspnea. Dyspnea onset was in 2002 and diagnosed with copd at that time. Initially started on nocutrnal o2 at that time. This is chronic, insidious onset, and progressive past 5 months. Since 5 months using o2 all the time. Rates dyspnea as severe and sometimes associated with panic. Denies associated cough other than very mild occassional cough and mild white sputum. Has associated mild pedal edema on and off past few months. Last spirometry 2007 per hx: details not known. CT scan sinus Nov 2011: normal. CXR may 2008 - clear but large hiatal hernia +  Reports associated chest pain - describes it as ache in back of sternum for past 3-4 months. Insidious onset. Has gotten better past few months. More at rest. Less with exertion. Denies known associated CAD. Recollects chemical cardiac stress test 3 years ago which she thinks was normal  Noted she is on ace inhibitor.   REC You have moderate-severe copd  We will change your oxygen to portable system  Please continue symbicort  Start spiriva - take sample, script and show technique  Continue oxygen  Will make referral to pulmonary rehab  Have pneumonia vaccine  AT fu do alpha 1 test  STop lisinopril - can make you cough. Take losartan generic 25mg  once daily instead; take script  Returin in 2 months    OV 09/09/2011  FU COPD.   Was advised to continue symbicort and spiriva added at last visit. She is using o2 all the time. She has stopped lisinopril. PFT 07/06/12 showed Gold stage 2 copd. FEv1 1.2L/60% with 8%BD response. But still not feeling any better in terms  of dyspnea. FEels that there is thick mucus inside chest and unable to bring it out. IN terms of chest pain, this is resolved but she is concerned that dyspnea could be due to heart issues She is yet to hear from rehab and very concerned about it  Past, Family, Social reviewed: She is concerned about family hx and is expressing it again - cancer and mI. Mom - died of MI. Father died of stomach cancer. Brother died of throat cancer. Immediate Elder sister died of lung cancer. Baby brother died of brain cancer. Eldest sister recently diagnosed with lung cancer - prognosis of 4-5 months. She is asking about her odds for lung cancer (esp with her past hx of hysterectomy 1978, and had cancer in 1998 in spot of uterus - s/p XRT). Very keen to know this and wants any test possible for it.  #COPD  - You have moderate-severe copd  - continue oxygen portable system, symbicort and spiriva  - have asked pulmonary rehab why they have not contacted you; once I find out will call you back  - will ensure your pneumonia vaccine and flu vaccine status is updated  - today or at followup do alpha 1 test  #BP  - conitnue losartan generic 25mg  once daily  #Shortness of breath  - will refer you to cardiology given concern that some of your shortness of breath might be due to heart issue  #Cancer  risk and screening  - will do CT chest without contrast (low radiation dose protocol) for lung cancer screening; might need insurance approval  #Followup  Returin in 2 months  OV 11/17/2011 Followup Moderate-Severe COPD, Lung cancer screen, and Hypertension.   bp 150 sbp - did not take med today  feels well but still class 3 dyspnea. Will start rehab tomorrow. No cough. Anxious to get better. SAw Dr Eden Emms cards feb 2013 - dyspnea thought to be due to pulm issues  Relieved ct has no cancer; personally reviewed CT Wants stroller with wheels through DME Wants aciphex changed to omeprazole script   ECHO 10/01/11: PASP  slightly elevated 47, Gr 1 diast dysfn  CT chest 09/16/11:  No lung mass is identified.  IMPRESSION:  1. Severe emphysema.  2. Large hiatal hernia.  3. Atherosclerosis.  Original Report Authenticated By: Dellia Cloud, M.D.   Past, Family, Social reviewed: no change since last visit  REC COPD and shortness of breath  - You have moderate-severe copd - Dr Eden Emms cardiology thinks is all due to lungs and fitness - continue oxygen portable system, symbicort and spiriva - start pulmonary rehab  - today or at followup do alpha  1 test  #BP - conitnue losartan generic 25mg  once daily   #Cancer risk and screening  - no lung cancer on CT chest feb 2013  - we will discuss feb 2014 if you need another CT chest   #Followup Returin in 4 months or sooner if needed   OV 04/14/2012  Son with her today - wants to understand her disease  Last 2 days incereased congestion, sputum volume, wheeze but no change in dyspnea or cough. Relates it to weather. Mild-mod in serverity. Progressive. Not had flu shot yet. CAT score is 21  Wants to know if she should get shingles vaccine    Past, Family, Social reviewed: UTI month ago and too antibioticsds  #COPD and shortness of breath  - You have moderate-severe copd  - Have flu shot today 04/14/2012  - Today 04/14/2012 there is a mild flare of the disease  - Take doxycycline 100mg  po twice daily x 5 days; take after meals and avoid sunlight  - Take prednisone 40 mg daily x 2 days, then 20mg  daily x 2 days, then 10mg  daily x 2 days, then 5mg  daily x 2 days and stop  - Next time you have copd acting up call us straight away  - continue oxygen portable system, symbicort and spiriva  -continue pulmonary rehab  - some potin at followup will do alpha 1 test  #Cancer risk and screening  - no lung cancer on CT chest feb 2013  - we will discuss feb 2014 if you need another CT chest  #Health maintenance  - flu shot today 04/14/2012  - The shingles  vaccine is recommended for all people age > 60 per my reading. Please talk to Rogelia Boga, MD your PMD aboutt his  #Followup  - 6 months or sooner if needed    OV 08/03/2012  FU COPD - presents with Daughter who lives in [place], [place]. Daughter very concerned about projection of disease course and long term future though patient currently able to ADLs   - xmas eve 2013: went to ER after running out of O2. CXR clear except hiatal hernia. Rx prednisone and abx. Then got better. Now baseline though CAT score is 28 and worse than pripor visit when she was in reported aECopd. She  is compliant with oxygen at 4L [place] and spiriva but not with symbicort because she is ran out of symbicort (now says she is out of it for 2 months). She continues to use stroller with wheels to get around. She has completed pulmonary rehab august/sept 2013.    Past, Family, Social reviewed: no change since last visit. UTI of last visit is resolved. Did get shingles vaccine    CAT COPD Symptom & Quality of Life Score (GSK trademark) 0 is no burden. 5 is highest burden 04/14/2012  08/03/2012   Never Cough -> Cough all the time 1 4, baseline  No phlegm in chest -> Chest is full of phlegm 5 3, baseline  No chest tightness -> Chest feels very tight 1 0, /imrpoved  No dyspnea for 1 flight stairs/hill -> Very dyspneic for 1 flight of stairs 5 5, "improved"  No limitations for ADL at home -> Very limited with ADL at home 3 5  Confident leaving home -> Not at all confident leaving home 0 2  Sleep soundly -> Do not sleep soundly because of lung condition 3 4  Lots of Energy -> No energy at all 3 5  TOTAL Score (max 40)  21 28    Review of Systems  Constitutional: Negative for fever and unexpected weight change.  HENT: Negative for ear pain, nosebleeds, congestion, sore throat, rhinorrhea, sneezing, trouble swallowing, dental problem, postnasal drip and sinus pressure.   Eyes: Negative for redness and itching.    Respiratory: Positive for cough and shortness of breath. Negative for chest tightness and wheezing.   Cardiovascular: Negative for palpitations and leg swelling.  Gastrointestinal: Negative for nausea and vomiting.  Genitourinary: Negative for dysuria.  Musculoskeletal: Negative for joint swelling.  Skin: Negative for rash.  Neurological: Negative for headaches.  Hematological: Does not bruise/bleed easily.  Psychiatric/Behavioral: Negative for dysphoric mood. The patient is not nervous/anxious.       Family History  Problem Relation Age of Onset  . Heart disease Mother     MI  . Diabetes Mother   . Uterine cancer Mother   . Anemia Mother   . Hypertension Mother   . Stomach cancer Father   . Esophageal cancer Father   . Hypertension Father   . Diabetes Sister   . Anemia Sister   . Esophageal cancer Brother   . Hypertension Brother   . Diabetes Son   . Diabetes Son   . Brain cancer Brother   . Lung cancer Sister   . Liver cancer Sister   . Colon cancer Neg Hx   . Rectal cancer Neg Hx      History   Social History  . Marital Status: Divorced    Spouse Name: N/A    Number of Children: 4  . Years of Education: N/A   Occupational History  . disaled    Social History Main Topics  . Smoking status: Former Smoker -- 2.0 packs/day for 30 years    Types: Cigarettes    Quit date: 07/28/2002  . Smokeless tobacco: Never Used  . Alcohol Use: No  . Drug Use: No  . Sexually Active: Not on file   Other Topics Concern  . Not on file   Social History Narrative  . No narrative on file     No Known Allergies   Outpatient Prescriptions Prior to Visit  Medication Sig Dispense Refill  . albuterol (PROAIR HFA) 108 (90 BASE) MCG/ACT inhaler Inhale 2 puffs into the lungs  every 4 (four) hours as needed. For SOB  3 Inhaler  5  . escitalopram (LEXAPRO) 10 MG tablet Take 1 tablet (10 mg total) by mouth daily.  30 tablet  1  . Ferrous Sulfate Dried 200 (65 FE) MG TABS Take 200  mg by mouth daily.  30 tablet  6  . fluticasone (FLONASE) 50 MCG/ACT nasal spray Place 2 sprays into the nose daily as needed. For nasal drainage      . furosemide (LASIX) 40 MG tablet Take 1 tablet (40 mg total) by mouth daily.  30 tablet  3  . ibandronate (BONIVA) 150 MG tablet Take in the morning with a full glass of water, on an empty stomach,  4 tablet  3  . losartan (COZAAR) 25 MG tablet Take 1 tablet (25 mg total) by mouth daily.  90 tablet  2  . omeprazole (PRILOSEC) 20 MG capsule Take 1 capsule (20 mg total) by mouth daily.  30 capsule  6  . potassium chloride SA (K-DUR,KLOR-CON) 20 MEQ tablet Take 20 mEq by mouth daily.      . rosuvastatin (CRESTOR) 20 MG tablet Take 1 tablet (20 mg total) by mouth every other day.  30 tablet  1  . tiotropium (SPIRIVA HANDIHALER) 18 MCG inhalation capsule Place 1 capsule (18 mcg total) into inhaler and inhale daily.  30 capsule  6  . tobramycin (TOBREX) 0.3 % ophthalmic solution Place 1 drop into the left eye every 4 (four) hours.  5 mL  0  . traZODone (DESYREL) 100 MG tablet Take 100 mg by mouth at bedtime as needed. For sleep      . budesonide-formoterol (SYMBICORT) 160-4.5 MCG/ACT inhaler Inhale 2 puffs into the lungs 2 (two) times daily. scheduled      . [DISCONTINUED] amoxicillin (AMOXIL) 500 MG capsule Take 1 capsule (500 mg total) by mouth 3 (three) times daily.  21 capsule  0  . [DISCONTINUED] potassium chloride SA (K-DUR,KLOR-CON) 20 MEQ tablet Take 1 tablet (20 mEq total) by mouth 2 (two) times daily.  10 tablet  0  . [DISCONTINUED] predniSONE (DELTASONE) 20 MG tablet Take 3 po QD x 2d starting tomorrow, then 2 po QD x 3d then 1 po QD x 3d  15 tablet  0   Last reviewed on 08/03/2012  4:07 PM by Darrell Jewel, CMA      Objective:   Physical Exam Filed Vitals:   08/03/12 1608  BP: 122/70  Pulse: 75  Temp: 98.2 F (36.8 C)  TempSrc: Oral  Height: 5\' 4"  (1.626 m)  Weight: 195 lb 9.6 oz (88.724 kg)  SpO2: 88%    Obese Looks  well On o2 - wants prortable system Expiration prolonged. No distress.. No wheeze. No accessory muscle use. No crackles Normal percussion Normal S1S2+ Abd: obese, soft, no tenderness Ext: no cyanosis, no clubbing, no edema CNS: AxOX3. Movesl all4s. Speech normal          Assessment & Plan:

## 2012-08-06 ENCOUNTER — Telehealth: Payer: Self-pay | Admitting: Internal Medicine

## 2012-08-06 NOTE — Telephone Encounter (Signed)
Pamela Hampton have you seen form? Pls advise thanks

## 2012-08-06 NOTE — Telephone Encounter (Signed)
Spoke with Corrie Dandy She states faxed a face to face sheet for MR to sign on 08/05/11 Wants to know the status of this Please advise, thanks!

## 2012-08-06 NOTE — Telephone Encounter (Signed)
No clue. Do not remember.  I do not know. I do not remember JC telling me about such a paper work. Happy to sign it and can document if need be when I am in office on Monday 08/10/11 if JC/you guys give me the paper work

## 2012-08-09 NOTE — Telephone Encounter (Signed)
Form completed and given back to alida. Carron Curie, CMA

## 2012-08-09 NOTE — Telephone Encounter (Signed)
Pamela Hampton, [facility], would like the status of their fact to fact form.  Advised that [name] gave this form for MR's signature this AM.  Requests this be completed asap.  Pamela Hampton can be reached at [phone].  Thanks.  [name]

## 2012-08-09 NOTE — Telephone Encounter (Signed)
Have face to face form ,filled out and given to Dr Marchelle Gearing for signature  .Kandice Hams

## 2012-08-09 NOTE — Telephone Encounter (Signed)
This form was faxed to [facility] this afternoon after Dr Marchelle Gearing signature.  Spoke with [facility] she has received it. Kandice Hams

## 2012-08-10 LAB — ALPHA-1 ANTITRYPSIN PHENOTYPE

## 2012-08-12 DIAGNOSIS — Z129 Encounter for screening for malignant neoplasm, site unspecified: Secondary | ICD-10-CM | POA: Insufficient documentation

## 2012-08-12 NOTE — Assessment & Plan Note (Signed)
#  COPD and shortness of breath  - You have moderate-severe copd - - Next time you have copd acting up call us straight away - continue oxygen portable system, symbicort and spiriva; CMA Will ensure refills esp of symbicort  - do not allow medications or oxygen to run out; this is why you are having flare up and ER vsitis -continue pulmonary exercises; have ordered home PT to see if they can help you  - consider [employer] systems for home monitoring  - TOday 08/03/2012 will do alpha  1 test       #Followup  - 6 months or sooner if needed

## 2012-08-12 NOTE — Assessment & Plan Note (Signed)
Discussed screening Ct chest for early detection of lung cancer Explained that in age 70-75 and smoking history, annual low dose CT chest can pick up lung cancer early and has potential to save lives and cure lung cancer This is similar to screening mammogram, colonoscopies and pap smears Explained Ct scan is low dose radiation Explained early lung cancer is curable but asymptomatic and best way to detect is CT scan Explained CT superior to CXR Explained that false positives are present and can incur cost and workup like biopsies, additional scan but benefit outweighs risk Explained currently out of pocket $300   She has had CT chest for without evidence of cancer in FEb 2013. We discussed repeat CT chest now for lung cancer screen based on above discussion but due to cost considerations she will defer thought on this till summer 2014

## 2012-08-19 ENCOUNTER — Other Ambulatory Visit: Payer: Self-pay | Admitting: Internal Medicine

## 2012-09-10 ENCOUNTER — Other Ambulatory Visit: Payer: Self-pay | Admitting: Internal Medicine

## 2012-09-13 ENCOUNTER — Other Ambulatory Visit: Payer: Self-pay | Admitting: *Deleted

## 2012-09-13 MED ORDER — POTASSIUM CHLORIDE CRYS ER 20 MEQ PO TBCR: 20.0000 meq | EXTENDED_RELEASE_TABLET | Freq: Every day | ORAL | Status: DC

## 2012-10-18 ENCOUNTER — Other Ambulatory Visit: Payer: Self-pay | Admitting: *Deleted

## 2012-10-18 MED ORDER — IBANDRONATE SODIUM 150 MG PO TABS: ORAL_TABLET | ORAL | Status: DC

## 2012-10-19 ENCOUNTER — Other Ambulatory Visit: Payer: Self-pay | Admitting: *Deleted

## 2012-10-19 MED ORDER — FLUTICASONE PROPIONATE 50 MCG/ACT NA SUSP: 2.0000 | Freq: Every day | NASAL | Status: DC | PRN

## 2012-10-28 ENCOUNTER — Telehealth: Payer: Self-pay | Admitting: Internal Medicine

## 2012-10-28 ENCOUNTER — Other Ambulatory Visit: Payer: Self-pay | Admitting: *Deleted

## 2012-10-28 MED ORDER — TRAZODONE HCL 100 MG PO TABS: ORAL_TABLET | ORAL | Status: DC

## 2012-10-28 MED ORDER — PREDNISONE 10 MG PO TABS: ORAL_TABLET | ORAL | Status: DC

## 2012-10-28 NOTE — Telephone Encounter (Signed)
I spoke with pt. Aware of cdy recs. rx called in.

## 2012-10-28 NOTE — Telephone Encounter (Signed)
Per CY-give Prednisone 10 mg #20 take 4 x 2 days, 3 x 2 days, 2 x 2 days,1 x 2 days then stop no refills.

## 2012-10-28 NOTE — Telephone Encounter (Signed)
I spoke with pt. She c/o increase SOB, chest tx, slight cough w/ clear phlem, nasal congestion, PND, watery/itchy eyes, sneezing x yesterday. Denies any wheezing. She states this is d/t the pollen bc she has a lot of trees in her front yard. Since MR is scheduled for 11 PM ELINK will send to [facility] ON CALL. Please advise Dr. Maple Hudson thanks Last OV 08/03/12 No Known Allergies

## 2012-11-04 ENCOUNTER — Telehealth: Payer: Self-pay | Admitting: Internal Medicine

## 2012-11-04 DIAGNOSIS — J449 Chronic obstructive pulmonary disease, unspecified: Secondary | ICD-10-CM

## 2012-11-04 NOTE — Telephone Encounter (Signed)
Spoke with Melissa  Order sent to [facility] for o2 3 lpm 24/7 cont Nothing further needed

## 2012-11-04 NOTE — Telephone Encounter (Signed)
Spoke with Melissa and she is going to check to see how often she uses the o2 with [facility] and call us back  Snapshot is unclear, states 3 lpm but not how often  Will await a call back and then send order

## 2012-11-05 ENCOUNTER — Other Ambulatory Visit: Payer: Self-pay | Admitting: Internal Medicine

## 2012-11-06 ENCOUNTER — Other Ambulatory Visit: Payer: Self-pay | Admitting: Internal Medicine

## 2012-12-03 ENCOUNTER — Other Ambulatory Visit: Payer: Self-pay | Admitting: Internal Medicine

## 2012-12-09 ENCOUNTER — Encounter: Payer: Medicare HMO | Admitting: Internal Medicine

## 2012-12-14 ENCOUNTER — Encounter: Payer: Self-pay | Admitting: Internal Medicine

## 2012-12-14 ENCOUNTER — Ambulatory Visit (INDEPENDENT_AMBULATORY_CARE_PROVIDER_SITE_OTHER): Payer: Medicare HMO | Admitting: Internal Medicine

## 2012-12-14 VITALS — BP 130/70 | HR 108 | Temp 98.4°F | Resp 18 | Wt 204.0 lb

## 2012-12-14 DIAGNOSIS — M810 Age-related osteoporosis without current pathological fracture: Secondary | ICD-10-CM

## 2012-12-14 DIAGNOSIS — F329 Major depressive disorder, single episode, unspecified: Secondary | ICD-10-CM

## 2012-12-14 DIAGNOSIS — I10 Essential (primary) hypertension: Secondary | ICD-10-CM

## 2012-12-14 DIAGNOSIS — F3289 Other specified depressive episodes: Secondary | ICD-10-CM

## 2012-12-14 DIAGNOSIS — E785 Hyperlipidemia, unspecified: Secondary | ICD-10-CM

## 2012-12-14 DIAGNOSIS — J449 Chronic obstructive pulmonary disease, unspecified: Secondary | ICD-10-CM

## 2012-12-14 NOTE — Progress Notes (Signed)
Subjective:    Patient ID: Pamela Hampton, female    DOB: 1940-10-09, 72 y.o.   MRN: [id number]  HPI  72 year old patient who is seen today for followup. She has a history of moderate severe COPD and is also followed by pulmonary medicine.  She is on chronic oxygen therapy and not limited by dyspnea on exertion.  A chest CT was performed earlier in the year that revealed severe emphysematous changes only. She has had a cardiology evaluation performed due 2 nonspecific chest pain. She is requesting additional assistance to help at home with aspects of daily living. She has a history depression and has been off Lexapro for a couple of weeks. She states she has become much more emotional. Stable medical problems include hypertension osteoporosis. She has remote history of a seizure disorder and has been on Depakote in the past.  Past Medical History  Diagnosis Date  . Allergy     Rhinitis  . Depression   . GERD (gastroesophageal reflux disease)     Barrett's esophagus  . Hyperlipidemia   . Hypertension   . Osteoporosis   . Seizures   . Barrett's esophagus 07/19/2004  . Anemia   . Obesity   . Pneumonia   . Stroke   . Sleep apnea   . Hepatitis   . Arthritis   . Blood transfusion   . COPD (chronic obstructive pulmonary disease)     wears 3 liters of o2 continious  . Emphysema of lung   . Ulcer   . Vaginal cancer   . Hiatal hernia     History   Social History  . Marital Status: Divorced    Spouse Name: N/A    Number of Children: 4  . Years of Education: N/A   Occupational History  . disaled    Social History Main Topics  . Smoking status: Former Smoker -- 2.00 packs/day for 30 years    Types: Cigarettes    Quit date: 07/28/2002  . Smokeless tobacco: Never Used  . Alcohol Use: No  . Drug Use: No  . Sexually Active: Not on file   Other Topics Concern  . Not on file   Social History Narrative  . No narrative on file    Past Surgical History  Procedure Laterality  Date  . Orif hip fracture      right  . Cholecystectomy    . Hemorrhoid surgery    . Total hip arthroplasty      right  . Abdominal hysterectomy    . 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 Abdominal laproscopy surgery      Family History  Problem Relation Age of Onset  . Heart disease Mother     MI  . Diabetes Mother   . Uterine cancer Mother   . Anemia Mother   . Hypertension Mother   . Stomach cancer Father   . Esophageal cancer Father   . Hypertension Father   . Diabetes Sister   . Anemia Sister   . Esophageal cancer Brother   . Hypertension Brother   . Diabetes Son   . Diabetes Son   . Brain cancer Brother   . Lung cancer Sister   . Liver cancer Sister   . Colon cancer Neg Hx   . Rectal cancer Neg Hx     No Known Allergies  Current Outpatient Prescriptions on File Prior to Visit  Medication Sig Dispense Refill  .  budesonide-formoterol (SYMBICORT) 160-4.5 MCG/ACT inhaler Inhale 2 puffs into the lungs 2 (two) times daily. scheduled  1 Inhaler  6  . escitalopram (LEXAPRO) 10 MG tablet Take 1 tablet (10 mg total) by mouth daily.  30 tablet  1  . Ferrous Sulfate Dried 200 (65 FE) MG TABS Take 200 mg by mouth daily.  30 tablet  6  . fluticasone (FLONASE) 50 MCG/ACT nasal spray Place 2 sprays into the nose daily as needed. For nasal drainage  16 g  6  . furosemide (LASIX) 40 MG tablet TAKE 1 TABLET (40 MG TOTAL) BY MOUTH DAILY.  30 tablet  3  . ibandronate (BONIVA) 150 MG tablet Take in the morning with a full glass of water, on an empty stomach,  4 tablet  3  . losartan (COZAAR) 25 MG tablet TAKE 1 TABLET BY MOUTH DAILY.  90 tablet  2  . omeprazole (PRILOSEC) 20 MG capsule TAKE 1 CAPSULE BY MOUTH ONCE DAILY.  30 capsule  6  . potassium chloride SA (K-DUR,KLOR-CON) 20 MEQ tablet Take 1 tablet (20 mEq total) by mouth daily.  30 tablet  2  . PROAIR HFA 108 (90 BASE) MCG/ACT inhaler INHALE 2 PUFFS INTO THE LUNGS  EVERY 4 HOLURS AS NEEDED FOR SHORTNESS OF BREATH  8.5 g  5  . rosuvastatin (CRESTOR) 20 MG tablet Take 1 tablet (20 mg total) by mouth every other day.  30 tablet  1  . tiotropium (SPIRIVA HANDIHALER) 18 MCG inhalation capsule Place 1 capsule (18 mcg total) into inhaler and inhale daily.  30 capsule  6  . tobramycin (TOBREX) 0.3 % ophthalmic solution Place 1 drop into the left eye every 4 (four) hours.  5 mL  0  . traZODone (DESYREL) 100 MG tablet TAKE 1 TABLET BY MOUTH AT BEDTIME.  90 tablet  1   No current facility-administered medications on file prior to visit.    BP 130/70  Pulse 108  Temp(Src) 98.4 F (36.9 C) (Oral)  Resp 18  Wt 204 lb (92.534 kg)  BMI 35 kg/m2  SpO2 93%       Review of Systems  Constitutional: Positive for fatigue.  HENT: Negative for hearing loss, congestion, sore throat, rhinorrhea, dental problem, sinus pressure and tinnitus.   Eyes: Negative for pain, discharge and visual disturbance.  Respiratory: Positive for shortness of breath. Negative for cough.   Cardiovascular: Negative for chest pain, palpitations and leg swelling.  Gastrointestinal: Negative for nausea, vomiting, abdominal pain, diarrhea, constipation, blood in stool and abdominal distention.  Genitourinary: Negative for dysuria, urgency, frequency, hematuria, flank pain, vaginal bleeding, vaginal discharge, difficulty urinating, vaginal pain and pelvic pain.  Musculoskeletal: Positive for gait problem. Negative for joint swelling and arthralgias.  Skin: Negative for rash.  Neurological: Positive for weakness. Negative for dizziness, syncope, speech difficulty, numbness and headaches.  Hematological: Negative for adenopathy.  Psychiatric/Behavioral: Positive for dysphoric mood. Negative for behavioral problems and agitation. The patient is not nervous/anxious.        Objective:   Physical Exam  Constitutional: She is oriented to person, place, and time. She appears well-developed and  well-nourished.  Sitting in wheelchair Weight 204 Blood pressure 130/70  HENT:  Head: Normocephalic.  Right Ear: External ear normal.  Left Ear: External ear normal.  Mouth/Throat: Oropharynx is clear and moist.  Eyes: Conjunctivae and EOM are normal. Pupils are equal, round, and reactive to light.  Neck: Normal range of motion. Neck supple. No thyromegaly present.  Cardiovascular: Normal rate,  regular rhythm, normal heart sounds and intact distal pulses.   Pulmonary/Chest: Effort normal.  Diminished breath sounds but clear O2 saturation 94% on 4 L of oxygen per minute  Pulse rate 93  Abdominal: Soft. Bowel sounds are normal. She exhibits no mass. There is no tenderness.  Musculoskeletal: Normal range of motion. She exhibits edema.  Lymphadenopathy:    She has no cervical adenopathy.  Neurological: She is alert and oriented to person, place, and time.  Skin: Skin is warm and dry. No rash noted.  Psychiatric: She has a normal mood and affect. Her behavior is normal.          Assessment & Plan:   Advanced COPD oxygen dependent Dyslipidemia. Continue Crestor hypertension stable Osteoporosis  We'll set up for additional assistance with her home health agency Medicines refilled Recheck 6 months for flu vaccine

## 2012-12-14 NOTE — Patient Instructions (Signed)
Limit your sodium (Salt) intake  You need to lose weight.  Consider a lower calorie diet and regular exercise.  Return in 6 months for follow-up   

## 2012-12-16 ENCOUNTER — Other Ambulatory Visit: Payer: Self-pay | Admitting: Internal Medicine

## 2012-12-31 ENCOUNTER — Other Ambulatory Visit: Payer: Self-pay | Admitting: Internal Medicine

## 2013-01-06 ENCOUNTER — Encounter: Payer: Self-pay | Admitting: Internal Medicine

## 2013-01-06 ENCOUNTER — Ambulatory Visit (INDEPENDENT_AMBULATORY_CARE_PROVIDER_SITE_OTHER): Payer: Medicare HMO | Admitting: Internal Medicine

## 2013-01-06 VITALS — BP 120/66 | HR 91 | Temp 98.6°F | Resp 20

## 2013-01-06 DIAGNOSIS — J449 Chronic obstructive pulmonary disease, unspecified: Secondary | ICD-10-CM

## 2013-01-06 DIAGNOSIS — E538 Deficiency of other specified B group vitamins: Secondary | ICD-10-CM

## 2013-01-06 DIAGNOSIS — Q279 Congenital malformation of peripheral vascular system, unspecified: Secondary | ICD-10-CM

## 2013-01-06 DIAGNOSIS — D509 Iron deficiency anemia, unspecified: Secondary | ICD-10-CM

## 2013-01-06 DIAGNOSIS — J4489 Other specified chronic obstructive pulmonary disease: Secondary | ICD-10-CM

## 2013-01-06 DIAGNOSIS — Q2730 Arteriovenous malformation, site unspecified: Secondary | ICD-10-CM

## 2013-01-06 DIAGNOSIS — I10 Essential (primary) hypertension: Secondary | ICD-10-CM

## 2013-01-06 DIAGNOSIS — E785 Hyperlipidemia, unspecified: Secondary | ICD-10-CM

## 2013-01-06 DIAGNOSIS — Z0000 Encounter for general adult medical examination without abnormal findings: Secondary | ICD-10-CM

## 2013-01-06 LAB — CBC WITH DIFFERENTIAL/PLATELET
Eosinophils Relative: 1.7 % (ref 0.0–5.0)
HCT: 29.1 % — ABNORMAL LOW (ref 36.0–46.0)
Hemoglobin: 8.6 g/dL — ABNORMAL LOW (ref 12.0–15.0)
Lymphs Abs: 3.7 10*3/uL (ref 0.7–4.0)
Monocytes Relative: 8.6 % (ref 3.0–12.0)
Neutro Abs: 5.8 10*3/uL (ref 1.4–7.7)
WBC: 10.6 10*3/uL — ABNORMAL HIGH (ref 4.5–10.5)

## 2013-01-06 LAB — COMPREHENSIVE METABOLIC PANEL
ALT: 9 U/L (ref 0–35)
Alkaline Phosphatase: 58 U/L (ref 39–117)
CO2: 40 mEq/L — ABNORMAL HIGH (ref 19–32)
Creatinine, Ser: 0.9 mg/dL (ref 0.4–1.2)
GFR: 63.4 mL/min (ref >60.00)
Glucose, Bld: 85 mg/dL (ref 70–99)
Total Bilirubin: 0.5 mg/dL (ref 0.3–1.2)

## 2013-01-06 LAB — LIPID PANEL
HDL: 47.8 mg/dL (ref >39.00)
Total CHOL/HDL Ratio: 3
Triglycerides: 197 mg/dL — ABNORMAL HIGH (ref 0.0–149.0)
VLDL: 39.4 mg/dL (ref 0.0–40.0)

## 2013-01-06 MED ORDER — ROSUVASTATIN CALCIUM 20 MG PO TABS: 20.0000 mg | ORAL_TABLET | ORAL | Status: DC

## 2013-01-06 NOTE — Progress Notes (Signed)
Subjective:    Patient ID: Pamela Hampton, female    DOB: 1940-10-11, 72 y.o.   MRN: [id number]  HPI  72 year old patient who is seen today for a preventive health examination. She has modest severe COPD and is followed by pulmonary medicine. She is also been seen by cardiology. She has oxygen-dependent COPD. Doing reasonably well today.  Past Medical History  Diagnosis Date  . Allergy     Rhinitis  . Depression   . GERD (gastroesophageal reflux disease)     Barrett's esophagus  . Hyperlipidemia   . Hypertension   . Osteoporosis   . Seizures   . Barrett's esophagus 07/19/2004  . Anemia   . Obesity   . Pneumonia   . Stroke   . Sleep apnea   . Hepatitis   . Arthritis   . Blood transfusion   . COPD (chronic obstructive pulmonary disease)     wears 3 liters of o2 continious  . Emphysema of lung   . Ulcer   . Vaginal cancer   . Hiatal hernia     History   Social History  . Marital Status: Divorced    Spouse Name: N/A    Number of Children: 4  . Years of Education: N/A   Occupational History  . disaled    Social History Main Topics  . Smoking status: Former Smoker -- 2.00 packs/day for 30 years    Types: Cigarettes    Quit date: 07/28/2002  . Smokeless tobacco: Never Used  . Alcohol Use: No  . Drug Use: No  . Sexually Active: Not on file   Other Topics Concern  . Not on file   Social History Narrative  . No narrative on file    Past Surgical History  Procedure Laterality Date  . Orif hip fracture      right  . Cholecystectomy    . Hemorrhoid surgery    . Total hip arthroplasty      right  . Abdominal hysterectomy    . 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 Abdominal laproscopy surgery      Family History  Problem Relation Age of Onset  . Heart disease Mother     MI  . Diabetes Mother   . Uterine cancer Mother   . Anemia Mother   . Hypertension Mother   . Stomach cancer Father   .  Esophageal cancer Father   . Hypertension Father   . Diabetes Sister   . Anemia Sister   . Esophageal cancer Brother   . Hypertension Brother   . Diabetes Son   . Diabetes Son   . Brain cancer Brother   . Lung cancer Sister   . Liver cancer Sister   . Colon cancer Neg Hx   . Rectal cancer Neg Hx     No Known Allergies  Current Outpatient Prescriptions on File Prior to Visit  Medication Sig Dispense Refill  . budesonide-formoterol (SYMBICORT) 160-4.5 MCG/ACT inhaler Inhale 2 puffs into the lungs 2 (two) times daily. scheduled  1 Inhaler  6  . escitalopram (LEXAPRO) 10 MG tablet TAKE 1 TABLET BY MOUTH DAILY.  30 tablet  2  . Ferrous Sulfate Dried 200 (65 FE) MG TABS Take 200 mg by mouth daily.  30 tablet  6  . fluticasone (FLONASE) 50 MCG/ACT nasal spray Place 2 sprays into the nose daily as needed. For nasal drainage  16 g  6  .  furosemide (LASIX) 40 MG tablet TAKE 1 TABLET (40 MG TOTAL) BY MOUTH DAILY.  30 tablet  3  . ibandronate (BONIVA) 150 MG tablet Take in the morning with a full glass of water, on an empty stomach,  4 tablet  3  . losartan (COZAAR) 25 MG tablet TAKE 1 TABLET BY MOUTH DAILY.  90 tablet  2  . omeprazole (PRILOSEC) 20 MG capsule TAKE 1 CAPSULE BY MOUTH ONCE DAILY.  30 capsule  6  . potassium chloride SA (K-DUR,KLOR-CON) 20 MEQ tablet TAKE 1 TABLET BY MOUTH DAILY.  30 tablet  2  . PROAIR HFA 108 (90 BASE) MCG/ACT inhaler INHALE 2 PUFFS INTO THE LUNGS EVERY 4 HOLURS AS NEEDED FOR SHORTNESS OF BREATH  8.5 g  5  . tiotropium (SPIRIVA HANDIHALER) 18 MCG inhalation capsule Place 1 capsule (18 mcg total) into inhaler and inhale daily.  30 capsule  6  . traZODone (DESYREL) 100 MG tablet TAKE 1 TABLET BY MOUTH AT BEDTIME.  90 tablet  1   No current facility-administered medications on file prior to visit.    BP 120/66  Pulse 91  Temp(Src) 98.6 F (37 C) (Oral)  Resp 20  SpO2 95%   1. Risk factors, based on past  M,S,F history-  cardiovascular risk factors include  hypertension and dyslipidemia.  2.  Physical activities: Markedly impaired due to  advanced COPD. Basically is the wheelchair-bound except for minimal walking of short lengths  3.  Depression/mood: History depression. Presently well controlled on Lexapro  4.  Hearing: Mild to moderate deficits  5.  ADL's: Requires assistance in most aspects of daily living  6.  Fall risk: Moderate do to pulmonary disease and general deconditioning  7.  Home safety: No problems identified  8.  Height weight, and visual acuity; height and weight stable patient does have a history of osteoporosis complains of diminished visual acuity  9.  Counseling: Heart healthy diet modest weight loss encouraged  10. Lab orders based on risk factors: Laboratory profile including lipid panel will be reviewed  11. Referral : Followup pulmonary medicine  12. Care plan: Followup pulmonary medicine. Low-salt diet weight loss encouraged  13. Cognitive assessment: Alert in order with normal affect. No cognitive dysfunction       Review of Systems  Constitutional: Negative.   HENT: Positive for hearing loss. Negative for congestion, sore throat, rhinorrhea, dental problem, sinus pressure and tinnitus.   Eyes: Positive for visual disturbance. Negative for pain and discharge.  Respiratory: Positive for shortness of breath. Negative for cough.   Cardiovascular: Negative for chest pain, palpitations and leg swelling.  Gastrointestinal: Negative for nausea, vomiting, abdominal pain, diarrhea, constipation, blood in stool and abdominal distention.  Genitourinary: Negative for dysuria, urgency, frequency, hematuria, flank pain, vaginal bleeding, vaginal discharge, difficulty urinating, vaginal pain and pelvic pain.  Musculoskeletal: Positive for back pain and arthralgias. Negative for joint swelling and gait problem.  Skin: Negative for rash.  Neurological: Negative for dizziness, syncope, speech difficulty, weakness,  numbness and headaches.  Hematological: Negative for adenopathy.  Psychiatric/Behavioral: Negative for behavioral problems, dysphoric mood and agitation. The patient is not nervous/anxious.        Objective:   Physical Exam  Constitutional: She is oriented to person, place, and time. She appears well-developed and well-nourished.  Obese Nasal cannula oxygen in place Blood pressure low normal Weight 202  Appears older than stated age  HENT:  Head: Normocephalic.  Right Ear: External ear normal.  Left Ear: External  ear normal.  Mouth/Throat: Oropharynx is clear and moist.  Eyes: Conjunctivae and EOM are normal. Pupils are equal, round, and reactive to light.  Neck: Normal range of motion. Neck supple. No thyromegaly present.  Cardiovascular: Normal rate, regular rhythm and normal heart sounds.   Pedal pulses not easily palpable  Pulmonary/Chest: Effort normal.  Breath sounds diminished O2 saturation 95% on 3 L per minute  Abdominal: Soft. Bowel sounds are normal. She exhibits no mass. There is no tenderness.  Musculoskeletal: Normal range of motion.  Lymphadenopathy:    She has no cervical adenopathy.  Neurological: She is alert and oriented to person, place, and time.  Skin: Skin is warm and dry. No rash noted.  Psychiatric: She has a normal mood and affect. Her behavior is normal.          Assessment & Plan:   Preventive health examination Hypertension stable COPD History depression stable  Pulmonary followup Lab update Recheck 6 months with flu vaccine

## 2013-01-06 NOTE — Patient Instructions (Signed)
Limit your sodium (Salt) intake  Pulmonary follow-up as scheduled  Return in 6 months for follow-up 

## 2013-01-13 ENCOUNTER — Telehealth: Payer: Self-pay | Admitting: Internal Medicine

## 2013-01-13 MED ORDER — PREDNISONE 10 MG PO TABS: ORAL_TABLET | ORAL | Status: DC

## 2013-01-13 NOTE — Telephone Encounter (Signed)
Per CY-give Prednisone 10 mg #20 take 4 x 2 days, 3 x 2 days, 2 x 2 days, 1 x 2 days, then stop no refills. If fever, may need abx.

## 2013-01-13 NOTE — Telephone Encounter (Signed)
Called and spoke with pt and she is aware of CY recs and is aware that the prednisone has been sent to her pharmacy. Nothing further is needed.

## 2013-01-13 NOTE — Telephone Encounter (Signed)
Called and spoke with pt and she stated that she has had a cough x 2 days with green sputum.  Stated that MR normally gives her prednisone and this clears up.  Pt is not currently taking abx.  Pt is requesting that something be called in to the pharmacy for her.  CY please advise. Thanks  No Known Allergies   Last ov--08/03/2012 Next ov--02/14/2013

## 2013-02-03 ENCOUNTER — Other Ambulatory Visit: Payer: Self-pay | Admitting: Internal Medicine

## 2013-02-14 ENCOUNTER — Encounter: Payer: Self-pay | Admitting: Internal Medicine

## 2013-02-14 ENCOUNTER — Ambulatory Visit (INDEPENDENT_AMBULATORY_CARE_PROVIDER_SITE_OTHER): Payer: Medicare HMO | Admitting: Internal Medicine

## 2013-02-14 VITALS — BP 102/62 | HR 98 | Temp 98.0°F | Ht 64.0 in | Wt 206.0 lb

## 2013-02-14 DIAGNOSIS — J441 Chronic obstructive pulmonary disease with (acute) exacerbation: Secondary | ICD-10-CM

## 2013-02-14 MED ORDER — LEVOFLOXACIN 500 MG PO TABS: 500.0000 mg | ORAL_TABLET | Freq: Every day | ORAL | Status: DC

## 2013-02-14 MED ORDER — PREDNISONE 10 MG PO TABS: ORAL_TABLET | ORAL | Status: DC

## 2013-02-14 NOTE — Patient Instructions (Addendum)
Unclear why symptoms are a bit worse Continue oxygen and copd medications You might be having a mild flare up of copd  take levaquin 500mg once daily  X 5 days Take prednisone 40 mg daily x 2 days, then 20mg daily x 2 days, then 10mg daily x 2 days, then 5mg daily x 2 days and stop Flu shot in the fall REturn to see me in 3 months 

## 2013-02-14 NOTE — Progress Notes (Signed)
Subjective:    Patient ID: Pamela Hampton, female    DOB: April 24, 1941, 72 y.o.   MRN: [id number] PCP Rogelia Boga, MD  HPI OV 05/29/11: 72 year old female. 60 pack smoker quit 2004. Frequent UTI. 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 > 30# weight gain since past 2 years  Carries diagnosis of copd. Seen by Dr Lesia Hausen 05/09/11 and referred here for copd with hypoxemia needing 4L O2. Patient main complaint is dyspnea. Dyspnea onset was in 2002 and diagnosed with copd at that time. Initially started on nocutrnal o2 at that time. This is chronic, insidious onset, and progressive past 5 months. Since 5 months using o2 all the time. Rates dyspnea as severe and sometimes associated with panic. Denies associated cough other than very mild occassional cough and mild white sputum. Has associated mild pedal edema on and off past few months. Last spirometry 2007 per hx: details not known. CT scan sinus Nov 2011: normal. CXR may 2008 - clear but large hiatal hernia +  Reports associated chest pain - describes it as ache in back of sternum for past 3-4 months. Insidious onset. Has gotten better past few months. More at rest. Less with exertion. Denies known associated CAD. Recollects chemical cardiac stress test 3 years ago which she thinks was normal  Noted she is on ace inhibitor.   REC You have moderate-severe copd  We will change your oxygen to portable system  Please continue symbicort  Start spiriva - take sample, script and show technique  Continue oxygen  Will make referral to pulmonary rehab  Have pneumonia vaccine  AT fu do alpha 1 test  STop lisinopril - can make you cough. Take losartan generic 25mg  once daily instead; take script  Returin in 2 months    OV 09/09/2011  FU COPD.   Was advised to continue symbicort and spiriva added at last visit. She is using o2 all the time. She has stopped lisinopril. PFT 07/06/12 showed Gold stage 2 copd. FEv1 1.2L/60% with 8%BD response. But still  not feeling any better in terms of dyspnea. FEels that there is thick mucus inside chest and unable to bring it out. IN terms of chest pain, this is resolved but she is concerned that dyspnea could be due to heart issues She is yet to hear from rehab and very concerned about it  Past, Family, Social reviewed: She is concerned about family hx and is expressing it again - cancer and mI. Mom - died of MI. Father died of stomach cancer. Brother died of throat cancer. Immediate Elder sister died of lung cancer. Baby brother died of brain cancer. Eldest sister recently diagnosed with lung cancer - prognosis of 4-5 months. She is asking about her odds for lung cancer (esp with her past hx of hysterectomy 1978, and had cancer in 1998 in spot of uterus - s/p XRT). Very keen to know this and wants any test possible for it.  #COPD  - You have moderate-severe copd  - continue oxygen portable system, symbicort and spiriva  - have asked pulmonary rehab why they have not contacted you; once I find out will call you back  - will ensure your pneumonia vaccine and flu vaccine status is updated  - today or at followup do alpha 1 test  #BP  - conitnue losartan generic 25mg  once daily  #Shortness of breath  - will refer you to cardiology given concern that some of your shortness of breath might be due to  heart issue  #Cancer risk and screening  - will do CT chest without contrast (low radiation dose protocol) for lung cancer screening; might need insurance approval  #Followup  Returin in 2 months  OV 11/17/2011 Followup Moderate-Severe COPD, Lung cancer screen, and Hypertension.   bp 150 sbp - did not take med today  feels well but still class 3 dyspnea. Will start rehab tomorrow. No cough. Anxious to get better. SAw Dr Eden Emms cards feb 2013 - dyspnea thought to be due to pulm issues  Relieved ct has no cancer; personally reviewed CT Wants stroller with wheels through DME Wants aciphex changed to omeprazole  script   ECHO 10/01/11: PASP slightly elevated 47, Gr 1 diast dysfn  CT chest 09/16/11:  No lung mass is identified.  IMPRESSION:  1. Severe emphysema.  2. Large hiatal hernia.  3. Atherosclerosis.  Original Report Authenticated By: Dellia Cloud, M.D.   Past, Family, Social reviewed: no change since last visit  REC COPD and shortness of breath  - You have moderate-severe copd - Dr Eden Emms cardiology thinks is all due to lungs and fitness - continue oxygen portable system, symbicort and spiriva - start pulmonary rehab  - today or at followup do alpha  1 test  #BP - conitnue losartan generic 25mg  once daily   #Cancer risk and screening  - no lung cancer on CT chest feb 2013  - we will discuss feb 2014 if you need another CT chest   #Followup Returin in 4 months or sooner if needed   OV 04/14/2012  Son with her today - wants to understand her disease  Last 2 days incereased congestion, sputum volume, wheeze but no change in dyspnea or cough. Relates it to weather. Mild-mod in serverity. Progressive. Not had flu shot yet. CAT score is 21  Wants to know if she should get shingles vaccine    Past, Family, Social reviewed: UTI month ago and too antibioticsds  #COPD and shortness of breath  - You have moderate-severe copd  - Have flu shot today 04/14/2012  - Today 04/14/2012 there is a mild flare of the disease  - Take doxycycline 100mg  po twice daily x 5 days; take after meals and avoid sunlight  - Take prednisone 40 mg daily x 2 days, then 20mg  daily x 2 days, then 10mg  daily x 2 days, then 5mg  daily x 2 days and stop  - Next time you have copd acting up call us straight away  - continue oxygen portable system, symbicort and spiriva  -continue pulmonary rehab  - some potin at followup will do alpha 1 test  #Cancer risk and screening  - no lung cancer on CT chest feb 2013  - we will discuss feb 2014 if you need another CT chest  #Health maintenance  - flu shot  today 04/14/2012  - The shingles vaccine is recommended for all people age > 60 per my reading. Please talk to Rogelia Boga, MD your PMD aboutt his  #Followup  - 6 months or sooner if needed    OV 08/03/2012  FU COPD - presents with Daughter who lives in [place], [place]. Daughter very concerned about projection of disease course and long term future though patient currently able to ADLs   - xmas eve 2013: went to ER after running out of O2. CXR clear except hiatal hernia. Rx prednisone and abx. Then got better. Now baseline though CAT score is 28 and worse than pripor visit when she was  in reported aECopd. She is compliant with oxygen at 4L [place] and spiriva but not with symbicort because she is ran out of symbicort (now says she is out of it for 2 months). She continues to use stroller with wheels to get around. She has completed pulmonary rehab august/sept 2013.    Past, Family, Social reviewed: no change since last visit. UTI of last visit is resolved. Did get shingles vaccine   #COPD and shortness of breath  - You have moderate-severe copd  - - Next time you have copd acting up call us straight away  - continue oxygen portable system, symbicort and spiriva; CMA Will ensure refills esp of symbicort  - do not allow medications or oxygen to run out; this is why you are having flare up and ER vsitis  -continue pulmonary exercises; have ordered home PT to see if they can help you  - consider [employer] systems for home monitoring  - TOday 08/03/2012 will do alpha 1 test  #Cancer risk and screening  - no lung cancer on CT chest feb 2013  - we will reivew summer 2014 about repeat CT chest for lung cancer screen; we are not doing it in jan/feb 2014 (the 1 year time point) due to cost  #Followup  - 6 months or sooner if needed    OV 02/14/2013 FU COPD and chronic resp failure, cor pulmonale. O2 dependent  Last seen Jan 2014. Since then no hospital visits or ER or urgent care visits. No  acute medical problems.  Currently not on antibiotics. Lives at home. Was doing ADLS with o2 and some PT kind of exercises and has kept herself busy. However, last 2 months more tired and able to get out only 2 times per week. THere is associated fatigue. Also last 2 - 3 months feeling worse with increased congestion, with light green phlegm though lately white.  No change in edema  CAT COPD Symptom & Quality of Life Score (GSK trademark) 0 is no burden. 5 is highest burden 04/14/2012  08/03/2012  02/14/2013   Never Cough -> Cough all the time 1 4, baseline 2  No phlegm in chest -> Chest is full of phlegm 5 3, baseline 5  No chest tightness -> Chest feels very tight 1 0, /imrpoved 4  No dyspnea for 1 flight stairs/hill -> Very dyspneic for 1 flight of stairs 5 5, "improved" 5  No limitations for ADL at home -> Very limited with ADL at home 3 5 5   Confident leaving home -> Not at all confident leaving home 0 2 3  Sleep soundly -> Do not sleep soundly because of lung condition 3 4 4   Lots of Energy -> No energy at all 3 5 5   TOTAL Score (max 40)  21 28 32         Review of Systems  Constitutional: Positive for fatigue. Negative for fever and unexpected weight change.  HENT: Negative for ear pain, nosebleeds, congestion, sore throat, rhinorrhea, sneezing, trouble swallowing, dental problem, postnasal drip and sinus pressure.   Eyes: Negative for redness and itching.  Respiratory: Positive for shortness of breath. Negative for cough, chest tightness and wheezing.   Cardiovascular: Negative for palpitations and leg swelling.  Gastrointestinal: Negative for nausea and vomiting.  Genitourinary: Negative for dysuria.  Musculoskeletal: Negative for joint swelling.  Skin: Negative for rash.  Neurological: Negative for headaches.  Hematological: Does not bruise/bleed easily.  Psychiatric/Behavioral: Negative for dysphoric mood. The patient is not  nervous/anxious.        Past, Family, Social  reviewed: no change since last visit  Objective:   Physical Exam  Vitals reviewed. Constitutional: She is oriented to person, place, and time. She appears well-developed and well-nourished. No distress.  Obese. Body mass index is 35.34 kg/(m^2). Sitting in wheel chair. O2 on  HENT:  Head: Normocephalic and atraumatic.  Right Ear: External ear normal.  Left Ear: External ear normal.  Mouth/Throat: Oropharynx is clear and moist. No oropharyngeal exudate.  Eyes: Conjunctivae and EOM are normal. Pupils are equal, round, and reactive to light. Right eye exhibits no discharge. Left eye exhibits no discharge. No scleral icterus.  Neck: Normal range of motion. Neck supple. No JVD present. No tracheal deviation present. No thyromegaly present.  Cardiovascular: Normal rate, regular rhythm, normal heart sounds and intact distal pulses.  Exam reveals no gallop and no friction rub.   No murmur heard. Pulmonary/Chest: Effort normal and breath sounds normal. No respiratory distress. She has no wheezes. She has no rales. She exhibits no tenderness.  barrell chest  Abdominal: Soft. Bowel sounds are normal. She exhibits no distension and no mass. There is no tenderness. There is no rebound and no guarding.  Musculoskeletal: Normal range of motion. She exhibits no edema and no tenderness.  Lymphadenopathy:    She has no cervical adenopathy.  Neurological: She is alert and oriented to person, place, and time. She has normal reflexes. No cranial nerve deficit. She exhibits normal muscle tone. Coordination normal.  Skin: Skin is warm and dry. No rash noted. She is not diaphoretic. No erythema. No pallor.  Psychiatric: She has a normal mood and affect. Her behavior is normal. Judgment and thought content normal.   Filed Vitals:   02/14/13 1151 02/14/13 1154  BP: 102/62   Pulse: 98   Temp: 98 F (36.7 C)   TempSrc: Oral   Height: 5\' 4"  (1.626 m)   Weight: 206 lb (93.441 kg)   SpO2: 86% 93%                                       Assessment & Plan:

## 2013-02-24 DIAGNOSIS — J441 Chronic obstructive pulmonary disease with (acute) exacerbation: Secondary | ICD-10-CM | POA: Insufficient documentation

## 2013-02-24 NOTE — Assessment & Plan Note (Signed)
Unclear why symptoms are a bit worse Continue oxygen and copd medications You might be having a mild flare up of copd  take levaquin 500mg  once daily  X 5 days Take prednisone 40 mg daily x 2 days, then 20mg  daily x 2 days, then 10mg  daily x 2 days, then 5mg  daily x 2 days and stop Flu shot in the fall REturn to see me in 3 months

## 2013-02-28 ENCOUNTER — Other Ambulatory Visit: Payer: Self-pay | Admitting: Internal Medicine

## 2013-03-01 ENCOUNTER — Other Ambulatory Visit: Payer: Self-pay | Admitting: *Deleted

## 2013-03-01 MED ORDER — FLUTICASONE PROPIONATE 50 MCG/ACT NA SUSP: 2.0000 | Freq: Every day | NASAL | Status: DC | PRN

## 2013-03-01 MED ORDER — FUROSEMIDE 40 MG PO TABS: ORAL_TABLET | ORAL | Status: DC

## 2013-03-29 ENCOUNTER — Other Ambulatory Visit: Payer: Self-pay | Admitting: Internal Medicine

## 2013-04-05 ENCOUNTER — Other Ambulatory Visit: Payer: Self-pay | Admitting: Internal Medicine

## 2013-04-08 ENCOUNTER — Ambulatory Visit: Payer: Medicare HMO | Admitting: Internal Medicine

## 2013-04-12 ENCOUNTER — Encounter: Payer: Self-pay | Admitting: Internal Medicine

## 2013-04-12 ENCOUNTER — Ambulatory Visit (INDEPENDENT_AMBULATORY_CARE_PROVIDER_SITE_OTHER): Payer: Medicare HMO | Admitting: Internal Medicine

## 2013-04-12 VITALS — BP 110/60 | HR 101 | Temp 98.1°F | Resp 22 | Wt 202.0 lb

## 2013-04-12 DIAGNOSIS — J4489 Other specified chronic obstructive pulmonary disease: Secondary | ICD-10-CM

## 2013-04-12 DIAGNOSIS — Q2730 Arteriovenous malformation, site unspecified: Secondary | ICD-10-CM

## 2013-04-12 DIAGNOSIS — Q279 Congenital malformation of peripheral vascular system, unspecified: Secondary | ICD-10-CM

## 2013-04-12 DIAGNOSIS — I10 Essential (primary) hypertension: Secondary | ICD-10-CM

## 2013-04-12 DIAGNOSIS — D509 Iron deficiency anemia, unspecified: Secondary | ICD-10-CM

## 2013-04-12 DIAGNOSIS — J449 Chronic obstructive pulmonary disease, unspecified: Secondary | ICD-10-CM

## 2013-04-12 DIAGNOSIS — Z23 Encounter for immunization: Secondary | ICD-10-CM

## 2013-04-12 DIAGNOSIS — E785 Hyperlipidemia, unspecified: Secondary | ICD-10-CM

## 2013-04-12 NOTE — Patient Instructions (Signed)
Limit your sodium (Salt) intake  You need to lose weight.  Consider a lower calorie diet and regular exercise.  Return in 4 months for follow-up  

## 2013-04-12 NOTE — Progress Notes (Signed)
Subjective:    Patient ID: Pamela Hampton, female    DOB: July 09, 1941, 72 y.o.   MRN: [id number]  HPI  72 year old patient who is seen today for followup. She is followed closely by pulmonary medicine do to moderately severe COPD. She is oxygen dependent and this has been stable. She has a history of hypertension and dyslipidemia. 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 Hemoglobin has been in the range of 8.5 g percent. In general doing fairly well today.  Past Medical History  Diagnosis Date  . Allergy     Rhinitis  . Depression   . GERD (gastroesophageal reflux disease)     Barrett's esophagus  . Hyperlipidemia   . Hypertension   . Osteoporosis   . Seizures   . Barrett's esophagus 07/19/2004  . Anemia   . Obesity   . Pneumonia   . Stroke   . Sleep apnea   . Hepatitis   . Arthritis   . Blood transfusion   . COPD (chronic obstructive pulmonary disease)     wears 3 liters of o2 continious  . Emphysema of lung   . Ulcer   . Vaginal cancer   . Hiatal hernia     History   Social History  . Marital Status: Divorced    Spouse Name: N/A    Number of Children: 4  . Years of Education: N/A   Occupational History  . disaled    Social History Main Topics  . Smoking status: Former Smoker -- 2.00 packs/day for 30 years    Types: Cigarettes    Quit date: 07/28/2002  . Smokeless tobacco: Never Used  . Alcohol Use: No  . Drug Use: No  . Sexual Activity: Not on file   Other Topics Concern  . Not on file   Social History Narrative  . No narrative on file    Past Surgical History  Procedure Laterality Date  . Orif hip fracture      right  . Cholecystectomy    . Hemorrhoid surgery    . Total hip arthroplasty      right  . Abdominal hysterectomy    . 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 Abdominal laproscopy surgery      Family History  Problem  Relation Age of Onset  . Heart disease Mother     MI  . Diabetes Mother   . Uterine cancer Mother   . Anemia Mother   . Hypertension Mother   . Stomach cancer Father   . Esophageal cancer Father   . Hypertension Father   . Diabetes Sister   . Anemia Sister   . Esophageal cancer Brother   . Hypertension Brother   . Diabetes Son   . Diabetes Son   . Brain cancer Brother   . Lung cancer Sister   . Liver cancer Sister   . Colon cancer Neg Hx   . Rectal cancer Neg Hx     No Known Allergies  Current Outpatient Prescriptions on File Prior to Visit  Medication Sig Dispense Refill  . escitalopram (LEXAPRO) 10 MG tablet TAKE 1 TABLET BY MOUTH DAILY.  30 tablet  2  . Ferrous Sulfate Dried 200 (65 FE) MG TABS Take 200 mg by mouth daily.  30 tablet  6  . fluticasone (FLONASE) 50 MCG/ACT nasal spray Place 2 sprays into the nose daily  as needed. For nasal drainage  16 g  6  . furosemide (LASIX) 40 MG tablet TAKE 1 TABLET (40 MG TOTAL) BY MOUTH DAILY.  30 tablet  5  . ibandronate (BONIVA) 150 MG tablet Take in the morning with a full glass of water, on an empty stomach,  4 tablet  3  . losartan (COZAAR) 25 MG tablet TAKE 1 TABLET BY MOUTH DAILY.  90 tablet  2  . omeprazole (PRILOSEC) 20 MG capsule TAKE 1 CAPSULE BY MOUTH ONCE DAILY.  30 capsule  6  . potassium chloride SA (K-DUR,KLOR-CON) 20 MEQ tablet TAKE 1 TABLET BY MOUTH DAILY.  30 tablet  2  . PROAIR HFA 108 (90 BASE) MCG/ACT inhaler INHALE 2 PUFFS INTO THE LUNGS EVERY 4 HOURS AS NEEDED FOR SHORTNESS OF BREATH  8.5 g  5  . rosuvastatin (CRESTOR) 20 MG tablet Take 1 tablet (20 mg total) by mouth every other day.  90 tablet  1  . SPIRIVA HANDIHALER 18 MCG inhalation capsule INHALE THE CONTENTS OF 1 CAPSULE BY MOUTH VIA HANDIHALER ONCE DAILY.  30 capsule  6  . SYMBICORT 160-4.5 MCG/ACT inhaler INHALE 2 PUFFS BY MOUTH 2 TIMES A DAY.  10.2 g  6  . traZODone (DESYREL) 100 MG tablet TAKE 1 TABLET BY MOUTH AT BEDTIME.  90 tablet  1   No current  facility-administered medications on file prior to visit.    BP 110/60  Pulse 101  Temp(Src) 98.1 F (36.7 C) (Oral)  Resp 22  Wt 202 lb (91.627 kg)  BMI 34.66 kg/m2  SpO2 91%       Review of Systems  Constitutional: Positive for fatigue.  HENT: Negative for hearing loss, congestion, sore throat, rhinorrhea, dental problem, sinus pressure and tinnitus.   Eyes: Negative for pain, discharge and visual disturbance.  Respiratory: Positive for shortness of breath. Negative for cough.   Cardiovascular: Negative for chest pain, palpitations and leg swelling.  Gastrointestinal: Negative for nausea, vomiting, abdominal pain, diarrhea, constipation, blood in stool and abdominal distention.  Genitourinary: Negative for dysuria, urgency, frequency, hematuria, flank pain, vaginal bleeding, vaginal discharge, difficulty urinating, vaginal pain and pelvic pain.  Musculoskeletal: Negative for joint swelling, arthralgias and gait problem.  Skin: Negative for rash.  Neurological: Positive for weakness. Negative for dizziness, syncope, speech difficulty, numbness and headaches.  Hematological: Negative for adenopathy.  Psychiatric/Behavioral: Negative for behavioral problems, dysphoric mood and agitation. The patient is not nervous/anxious.        Objective:   Physical Exam  Constitutional: She is oriented to person, place, and time. She appears well-developed and well-nourished.  Overweight Chronically ill Nasal cannula oxygen in place No distress Blood pressure low normal O2 saturation 95%  HENT:  Head: Normocephalic.  Right Ear: External ear normal.  Left Ear: External ear normal.  Mouth/Throat: Oropharynx is clear and moist.  Eyes: Conjunctivae and EOM are normal. Pupils are equal, round, and reactive to light.  Neck: Normal range of motion. Neck supple. No thyromegaly present.  Cardiovascular: Normal rate, regular rhythm, normal heart sounds and intact distal pulses.    Pulmonary/Chest: Effort normal.  Diminished breath sounds. No active wheezing  Abdominal: Soft. Bowel sounds are normal. She exhibits no mass. There is no tenderness.  Musculoskeletal: Normal range of motion. She exhibits no edema.  Lymphadenopathy:    She has no cervical adenopathy.  Neurological: She is alert and oriented to person, place, and time.  Skin: Skin is warm and dry. No rash noted.  Psychiatric:  She has a normal mood and affect. Her behavior is normal.          Assessment & Plan:   Chronic GI bleeding with secondary anemia. We'll check a CBC. We'll continue iron supplementation COPD  pulmonary followup as scheduled Hypertension well controlled Dyslipidemia

## 2013-04-13 LAB — CBC WITH DIFFERENTIAL/PLATELET
Eosinophils Absolute: 0.2 10*3/uL (ref 0.0–0.7)
Eosinophils Relative: 2 % (ref 0.0–5.0)
MCHC: 30.1 g/dL (ref 30.0–36.0)
MCV: 80.5 fl (ref 78.0–100.0)
Monocytes Absolute: 1 10*3/uL (ref 0.1–1.0)
Neutrophils Relative %: 52.9 % (ref 43.0–77.0)
Platelets: 383 10*3/uL (ref 150.0–400.0)
WBC: 9.1 10*3/uL (ref 4.5–10.5)

## 2013-04-19 ENCOUNTER — Telehealth: Payer: Self-pay | Admitting: Internal Medicine

## 2013-04-19 MED ORDER — PREDNISONE 10 MG PO TABS: ORAL_TABLET | ORAL | Status: DC

## 2013-04-19 MED ORDER — LEVOFLOXACIN 500 MG PO TABS: 500.0000 mg | ORAL_TABLET | Freq: Every day | ORAL | Status: DC

## 2013-04-19 NOTE — Telephone Encounter (Signed)
Spoke with patient- Patient complaints of increased head congestion, prod cough with green mucus, and slightly worse difficulty breathing from her baseline. Denies any F/C/S Patient requesting prednisone be sent to [facility]-- Pred taper last sent: 02/14/13 ---prednisone 40 mg daily x 2 days, then 20mg  daily x 2 days, then 10mg  daily x 2 days, then 5mg  daily x 2 days and stop  Last OV: 02/14/13 Next OV: 06/01/13  No Known Allergies

## 2013-04-19 NOTE — Telephone Encounter (Signed)
You have mild attack of copd called COPD exacerbation Pleas  take levaquin 500mg  once daily  X 5 days Take prednisone 40 mg daily x 2 days, then 20mg  daily x 2 days, then 10mg  daily x 2 days, then 5mg  daily x 2 days and stop   Dr. Kalman Shan, M.D., [facility].C.P Pulmonary and Critical Care Medicine Staff Physician [facility] [facility] Pager: [phone], If no answer or between  15:00h - 7:00h: call [phone]  04/19/2013 3:31 PM

## 2013-04-19 NOTE — Telephone Encounter (Signed)
Spoke with patient-- Patient aware of recs per MR below Nothing further needed at this time

## 2013-05-11 ENCOUNTER — Telehealth: Payer: Self-pay | Admitting: Internal Medicine

## 2013-05-12 MED ORDER — PREDNISONE 10 MG PO TABS: ORAL_TABLET | ORAL | Status: DC

## 2013-05-12 NOTE — Telephone Encounter (Signed)
Last visit 02/14/13, next visit 06/10/13. MR COPD patient called and is c/o having productive cough with clear phlegm, chest tightness, chest congestion, and increased SOB with activity x 2 days,. She states she went to the beach recently and feels the change in climate has caused this. She feels like she needs an rx for prednisone. Please advise. Carron Curie, CMA No Known Allergies

## 2013-05-12 NOTE — Telephone Encounter (Signed)
I spoke with nurse and aware of recs. RX has been sent and nothing further needed

## 2013-05-12 NOTE — Telephone Encounter (Signed)
Per CY-give Prednisone 10 mg #20 take 4 x 2 days, 3 x 2 days, 2 x 2 days, 1 x 2 days, then stop no refills.  

## 2013-06-01 ENCOUNTER — Ambulatory Visit: Payer: Medicare HMO | Admitting: Internal Medicine

## 2013-06-02 ENCOUNTER — Other Ambulatory Visit: Payer: Self-pay

## 2013-06-10 ENCOUNTER — Ambulatory Visit (INDEPENDENT_AMBULATORY_CARE_PROVIDER_SITE_OTHER): Payer: Medicare HMO | Admitting: Internal Medicine

## 2013-06-10 ENCOUNTER — Encounter: Payer: Self-pay | Admitting: Internal Medicine

## 2013-06-10 VITALS — BP 132/78 | HR 78 | Ht 64.0 in | Wt 203.2 lb

## 2013-06-10 DIAGNOSIS — J449 Chronic obstructive pulmonary disease, unspecified: Secondary | ICD-10-CM

## 2013-06-10 DIAGNOSIS — J4489 Other specified chronic obstructive pulmonary disease: Secondary | ICD-10-CM

## 2013-06-10 DIAGNOSIS — J441 Chronic obstructive pulmonary disease with (acute) exacerbation: Secondary | ICD-10-CM

## 2013-06-10 MED ORDER — IPRATROPIUM BROMIDE 0.02 % IN SOLN: 0.5000 mg | Freq: Four times a day (QID) | RESPIRATORY_TRACT | Status: DC

## 2013-06-10 MED ORDER — DOXYCYCLINE HYCLATE 100 MG PO TABS: 100.0000 mg | ORAL_TABLET | Freq: Two times a day (BID) | ORAL | Status: DC

## 2013-06-10 MED ORDER — PREDNISONE 10 MG PO TABS: ORAL_TABLET | ORAL | Status: DC

## 2013-06-10 MED ORDER — ALBUTEROL SULFATE (2.5 MG/3ML) 0.083% IN NEBU: 2.5000 mg | INHALATION_SOLUTION | Freq: Four times a day (QID) | RESPIRATORY_TRACT | Status: DC

## 2013-06-10 MED ORDER — BECLOMETHASONE DIPROPIONATE 80 MCG/ACT IN AERS: 2.0000 | INHALATION_SPRAY | Freq: Two times a day (BID) | RESPIRATORY_TRACT | Status: DC

## 2013-06-10 NOTE — Progress Notes (Signed)
Subjective:    Patient ID: Pamela Hampton, female    DOB: 09-09-1940, 72 y.o.   MRN: [id number]  HPI  PCP Rogelia Boga, MD  # . 60 pack smoker quit 2004.   # Frequent UTI. Gyn cancer 1998  -s/p chemo Rx and. xrt = strong family hx of cancer  #lung cancer screen  - Feb 2013: No evicende of cancer on CT Chest  #Obesity  - >  30# weight gain since 2010-2012  - Body mass index is 34.86 kg/(m^2).   #large Hiatal hernia  #normal CT sinus 2011   #COPD December 2000 pulmonary function test shows FEV1 post bronchodilator at 1.3L/64%. Ratio 51 and DLCO 6.7/30\ Status post pulmonary rehabilitation summer 2013   #COr pulmonale with chronic resp failure- 4L o2, pulmonary artery systolic pressure of 47 in March 2013 echocardiogram  #AECOPD - September 2013 treated in the office with doxycycline and prednisone on 04/14/2012 - December 2013 4 emergency room treatment with prednisone and antibiotics - July 2014 office treatment with Levaquin and prednisone    OV 06/10/2013  Chief Complaint  Patient presents with  . COPD    follow-up. Pt states that SOB with activity is worse then last visit. Pt sats on 4 liters upon arrival was 88%.  Pt also states that she has been waking up during the night SOB as well.     FollowupCOPD or cor pulmonale. Oxygen dependent and chronic respiratory failure; on 4L aseline o2  Last seen July 2014. After that she called in the September 2014 and was treated on the telephone for COPD exacerbation with Levaquin and prednisone 8 days. Subsequent to that in the early part of October 2014 she went to [place] and there she fell she had a flareup of COPD. Apparently she called our office but this is not documented and we gave her a 5 day prednisone taper without antibiotics. However she feels that she never got improved from that. Continues to have worsening cough, shortness of breath, wheezing, increased sputum volume that is clear compared to  baseline. She wants to change to nebs  She does have worsening orthopnea now sleeping and is chair for the last several months but is not necessarily worse the last few weeks. No paroxysmal nocturnal dyspnea chest pain. Denies any fever  CAT COPD Symptom & Quality of Life Score (GSK trademark) 0 is no burden. 5 is highest burden 04/14/2012  08/03/2012  02/14/2013  06/10/2013   Never Cough -> Cough all the time 1 4, baseline 2 4  No phlegm in chest -> Chest is full of phlegm 5 3, baseline 5 4  No chest tightness -> Chest feels very tight 1 0, /imrpoved 4 4  No dyspnea for 1 flight stairs/hill -> Very dyspneic for 1 flight of stairs 5 5, "improved" 5 5  No limitations for ADL at home -> Very limited with ADL at home 3 5 5 5   Confident leaving home -> Not at all confident leaving home 0 2 3 4   Sleep soundly -> Do not sleep soundly because of lung condition 3 4 4 5   Lots of Energy -> No energy at all 3 5 5 5   TOTAL Score (max 40)  21 28 32 36        Review of Systems  Constitutional: Negative for fever and unexpected weight change.  HENT: Negative for congestion, dental problem, ear pain, nosebleeds, postnasal drip, rhinorrhea, sinus pressure, sneezing, sore throat and trouble swallowing.  Eyes: Negative for redness and itching.  Respiratory: Positive for cough and shortness of breath. Negative for chest tightness and wheezing.   Cardiovascular: Negative for palpitations and leg swelling.  Gastrointestinal: Negative for nausea and vomiting.  Genitourinary: Negative for dysuria.  Musculoskeletal: Negative for joint swelling.  Skin: Negative for rash.  Neurological: Negative for headaches.  Hematological: Does not bruise/bleed easily.  Psychiatric/Behavioral: Negative for dysphoric mood. The patient is not nervous/anxious.        Objective:   Physical Exam  Filed Vitals:   06/10/13 1333  BP: 132/78  Pulse: 78  Height: 5\' 4"  (1.626 m)  Weight: 203 lb 3.2 oz (92.171 kg)  SpO2:  93%     Physical Exam  Vitals reviewed. Constitutional: She is oriented to person, place, and time. She appears well-developed and well-nourished. No distress.  Obese. Body mass index is 34.86 kg/(m^2).  Sitting in wheel chair. O2 on  HENT:  Head: Normocephalic and atraumatic.  Right Ear: External ear normal.  Left Ear: External ear normal.  Mouth/Throat: Oropharynx is clear and moist. No oropharyngeal exudate.  Eyes: Conjunctivae and EOM are normal. Pupils are equal, round, and reactive to light. Right eye exhibits no discharge. Left eye exhibits no discharge. No scleral icterus.  Neck: Normal range of motion. Neck supple. No JVD present. No tracheal deviation present. No thyromegaly present.  Cardiovascular: Normal rate, regular rhythm, normal heart sounds and intact distal pulses.  Exam reveals no gallop and no friction rub.   No murmur heard. Pulmonary/Chest: Effort normal and breath sounds normal. No respiratory distress. She has no wheezes. She has no rales. She exhibits no tenderness.  barrell chest  Abdominal: Soft. Bowel sounds are normal. She exhibits no distension and no mass. There is no tenderness. There is no rebound and no guarding.  Musculoskeletal: Normal range of motion. She exhibits no edema and no tenderness.  Lymphadenopathy:    She has no cervical adenopathy.  Neurological: She is alert and oriented to person, place, and time. She has normal reflexes. No cranial nerve deficit. She exhibits normal muscle tone. Coordination normal.  Skin: Skin is warm and dry. No rash noted. She is not diaphoretic. No erythema. No pallor.  Psychiatric: She has a normal mood and affect. Her behavior is normal. Judgment and thought content normal.     Assessment & Plan:

## 2013-06-10 NOTE — Patient Instructions (Addendum)
#  COpd flare up 'YOue might be suffering from unresolved COPD flareup Take doxycycline 100mg  po twice daily x 5 days; take after meals and avoid sunlight Please take Take prednisone 40mg  once daily x 3 days, then 30mg  once daily x 3 days, then 20mg  once daily x 3 days, then prednisone 10mg  once daily  x 3 days and stop  #COPD Continue oxygen 4L [place] STop spiriva and symbicort at your request AT your request will do nebulizer as follows  = albuterol nebs 4 times daily   - atrovent nebs 4 times daily   - QVAR 2 puff twice daiy Glad you had flu shot  #Inability to lie flat - Orthopnea  - let us see if above changes will help this problem; if nit cards referral maybe  #lung cancer screen  - discuss this in 2015 based on medicare approval  #Followup 2 weeks with NP TAmmy If your orthopnea does not improve, then she will start cardiac workup

## 2013-06-11 ENCOUNTER — Other Ambulatory Visit: Payer: Self-pay | Admitting: Internal Medicine

## 2013-06-12 NOTE — Assessment & Plan Note (Signed)
#  COpd flare up 'YOue might be suffering from unresolved COPD flareup Take doxycycline 100mg  po twice daily x 5 days; take after meals and avoid sunlight Please take Take prednisone 40mg  once daily x 3 days, then 30mg  once daily x 3 days, then 20mg  once daily x 3 days, then prednisone 10mg  once daily  x 3 days and stop #Followup 2 weeks with NP TAmmy If your orthopnea does not improve, then she will start cardiac workup

## 2013-06-12 NOTE — Assessment & Plan Note (Signed)
#  COPD Continue oxygen 4L [place] STop spiriva and symbicort at your request AT your request will do nebulizer as follows  = albuterol nebs 4 times daily   - atrovent nebs 4 times daily   - QVAR 2 puff twice daiy Glad you had flu shot  #Inability to lie flat - Orthopnea  - let us see if above changes will help this problem; if nit cards referral maybe

## 2013-06-21 ENCOUNTER — Other Ambulatory Visit: Payer: Self-pay | Admitting: Internal Medicine

## 2013-06-22 ENCOUNTER — Other Ambulatory Visit: Payer: Self-pay | Admitting: Internal Medicine

## 2013-06-27 ENCOUNTER — Encounter: Payer: Self-pay | Admitting: Adult Health

## 2013-06-27 ENCOUNTER — Ambulatory Visit (INDEPENDENT_AMBULATORY_CARE_PROVIDER_SITE_OTHER): Payer: Medicare HMO | Admitting: Adult Health

## 2013-06-27 VITALS — BP 118/68 | HR 90 | Temp 98.1°F | Ht 64.0 in | Wt 195.6 lb

## 2013-06-27 DIAGNOSIS — J441 Chronic obstructive pulmonary disease with (acute) exacerbation: Secondary | ICD-10-CM

## 2013-06-27 NOTE — Progress Notes (Signed)
Subjective:    Patient ID: Pamela Hampton, female    DOB: Apr 26, 1941, 72 y.o.   MRN: [id number]  HPI PCP Rogelia Boga, MD  # . 60 pack smoker quit 2004.   # Frequent UTI. Gyn cancer 1998  -s/p chemo Rx and. xrt = strong family hx of cancer  #lung cancer screen  - Feb 2013: No evicende of cancer on CT Chest  #Obesity  - >  30# weight gain since 2010-2012  - Body mass index is 34.86 kg/(m^2).   #large Hiatal hernia  #normal CT sinus 2011   #COPD December 2000 pulmonary function test shows FEV1 post bronchodilator at 1.3L/64%. Ratio 51 and DLCO 6.7/30\ Status post pulmonary rehabilitation summer 2013   #COr pulmonale with chronic resp failure- 4L o2, pulmonary artery systolic pressure of 47 in March 2013 echocardiogram  #AECOPD - September 2013 treated in the office with doxycycline and prednisone on 04/14/2012 - December 2013 4 emergency room treatment with prednisone and antibiotics - July 2014 office treatment with Levaquin and prednisone    OV 06/10/2013 Chief Complaint  Patient presents with  . COPD    follow-up. Pt states that SOB with activity is worse then last visit. Pt sats on 4 liters upon arrival was 88%.  Pt also states that she has been waking up during the night SOB as well.     FollowupCOPD or cor pulmonale. Oxygen dependent and chronic respiratory failure; on 4L aseline o2  Last seen July 2014. After that she called in the September 2014 and was treated on the telephone for COPD exacerbation with Levaquin and prednisone 8 days. Subsequent to that in the early part of October 2014 she went to [place] and there she fell she had a flareup of COPD. Apparently she called our office but this is not documented and we gave her a 5 day prednisone taper without antibiotics. However she feels that she never got improved from that. Continues to have worsening cough, shortness of breath, wheezing, increased sputum volume that is clear compared to  baseline. She wants to change to nebs  She does have worsening orthopnea now sleeping and is chair for the last several months but is not necessarily worse the last few weeks. No paroxysmal nocturnal dyspnea chest pain. Denies any fever  CAT COPD Symptom & Quality of Life Score (GSK trademark) 0 is no burden. 5 is highest burden 04/14/2012  08/03/2012  02/14/2013  06/10/2013   Never Cough -> Cough all the time 1 4, baseline 2 4  No phlegm in chest -> Chest is full of phlegm 5 3, baseline 5 4  No chest tightness -> Chest feels very tight 1 0, /imrpoved 4 4  No dyspnea for 1 flight stairs/hill -> Very dyspneic for 1 flight of stairs 5 5, "improved" 5 5  No limitations for ADL at home -> Very limited with ADL at home 3 5 5 5   Confident leaving home -> Not at all confident leaving home 0 2 3 4   Sleep soundly -> Do not sleep soundly because of lung condition 3 4 4 5   Lots of Energy -> No energy at all 3 5 5 5   TOTAL Score (max 40)  21 28 32 36     06/27/2013 Follow up  Pt returns for a 2 week follow up. Reports breathing is improved since last ov.  Was changed from inhalers to neb with albuterol and ipratropium . Feels she is doing better on the nebs, is  now able to cough up congestion. CAT = 27 Patient denies any exertional chest pain, orthopnea, PND,, leg swelling, abdominal pain, nausea, vomiting. Pt says her dyspnea is about the same , wears out easily but tries to go out with her O2 as much as possible.  She has been seen by cardiology last year for symptos of dyspnea/DOE and chest pain. Echo 09/2011 with EF 55-60%, grade 1 diastolic dysfunction, PAP 47 .   Review of Systems  Constitutional: Negative for fever and unexpected weight change.  HENT: Negative for congestion, dental problem, ear pain, nosebleeds, postnasal drip, rhinorrhea, sinus pressure, sneezing, sore throat and trouble swallowing.   Eyes: Negative for redness and itching.  Respiratory: Positive for  shortness of breath.  Negative for chest tightness and wheezing.   Cardiovascular: Negative for palpitations and leg swelling.  Gastrointestinal: Negative for nausea and vomiting.  Genitourinary: Negative for dysuria.  Musculoskeletal: Negative for joint swelling.  Skin: Negative for rash.  Neurological: Negative for headaches.  Hematological: Does not bruise/bleed easily.  Psychiatric/Behavioral: Negative for dysphoric mood. The patient is not nervous/anxious.        Objective:   Physical Exam GEN: A/Ox3; pleasant , NAD, elderly   HEENT:  [place]/AT,  EACs-clear, TMs-wnl, NOSE-clear, THROAT-clear, no lesions, no postnasal drip or exudate noted.   NECK:  Supple w/ fair ROM; no JVD; normal carotid impulses w/o bruits; no thyromegaly or nodules palpated; no lymphadenopathy.  RESP  Diminished BS in bases , no accessory muscle use, no dullness to percussion  CARD:  RRR, no m/r/g  , no peripheral edema, pulses intact, no cyanosis or clubbing.  GI:   Soft & nt; nml bowel sounds; no organomegaly or masses detected.  Musco: Warm bil, no deformities or joint swelling noted.   Neuro: alert, no focal deficits noted.    Skin: Warm, no lesions or rashes      Assessment & Plan:

## 2013-06-27 NOTE — Patient Instructions (Addendum)
Continue Albuterol and Ipratropium Neb Four times a day   Continue on QVAR 2 puffs Twice daily  , rinse after use.  Continue on Oxygen 4l/m .  follow up Dr. Marchelle Gearing in 3 months and As needed   Please contact office for sooner follow up if symptoms do not improve or worsen or seek emergency care

## 2013-06-28 NOTE — Assessment & Plan Note (Addendum)
Recent flare -resolving  No sign of decompensated cor pulmonale. -continue to monitor    Plan  Continue Albuterol and Ipratropium Neb Four times a day   Continue on QVAR 2 puffs Twice daily  , rinse after use.  Continue on Oxygen 4l/m .  follow up Dr. Marchelle Gearing in 3 months and As needed   Please contact office for sooner follow up if symptoms do not improve or worsen or seek emergency care

## 2013-07-01 ENCOUNTER — Telehealth: Payer: Self-pay | Admitting: Internal Medicine

## 2013-07-01 NOTE — Telephone Encounter (Signed)
[employer] called to ask for pt's phone number. Refused to give to her, but will contacted pt w/ info.  LM to return my call.  [employer] call at [phone] Ext. [phone]

## 2013-07-05 ENCOUNTER — Other Ambulatory Visit: Payer: Self-pay | Admitting: Internal Medicine

## 2013-07-06 NOTE — Telephone Encounter (Signed)
Pt aware. Gave pt phone number to call

## 2013-07-12 ENCOUNTER — Other Ambulatory Visit: Payer: Self-pay | Admitting: Internal Medicine

## 2013-08-04 ENCOUNTER — Ambulatory Visit (INDEPENDENT_AMBULATORY_CARE_PROVIDER_SITE_OTHER): Payer: Medicare HMO | Admitting: Internal Medicine

## 2013-08-04 ENCOUNTER — Encounter: Payer: Self-pay | Admitting: Internal Medicine

## 2013-08-04 VITALS — BP 146/70 | HR 100 | Temp 98.2°F | Resp 22 | Ht 64.0 in | Wt 200.0 lb

## 2013-08-04 DIAGNOSIS — I10 Essential (primary) hypertension: Secondary | ICD-10-CM

## 2013-08-04 DIAGNOSIS — J449 Chronic obstructive pulmonary disease, unspecified: Secondary | ICD-10-CM

## 2013-08-04 NOTE — Progress Notes (Signed)
Subjective:    Patient ID: Pamela Hampton, female    DOB: September 09, 1940, 73 y.o.   MRN: [id number]  HPI  73 year old patient who has a history of moderate severe prostatism and COPD. She continues to be followed by pulmonary medicine. She has done reasonably well over the last several months. Remains on O2 therapy 4 L of oxygen per minute. She is requesting a hospital bed. She states in spite of raising the head of her bed she requires sleeping in a recliner. She has hypertension and dyslipidemia. Coronary status stable  Past Medical History  Diagnosis Date  . Allergy     Rhinitis  . Depression   . GERD (gastroesophageal reflux disease)     Barrett's esophagus  . Hyperlipidemia   . Hypertension   . Osteoporosis   . Seizures   . Barrett's esophagus 07/19/2004  . Anemia   . Obesity   . Pneumonia   . Stroke   . Sleep apnea   . Hepatitis   . Arthritis   . Blood transfusion   . COPD (chronic obstructive pulmonary disease)     wears 3 liters of o2 continious  . Emphysema of lung   . Ulcer   . Vaginal cancer   . Hiatal hernia     History   Social History  . Marital Status: Divorced    Spouse Name: N/A    Number of Children: 25  . Years of Education: N/A   Occupational History  . disaled    Social History Main Topics  . Smoking status: Former Smoker -- 2.00 packs/day for 30 years    Types: Cigarettes    Quit date: 07/28/2002  . Smokeless tobacco: Never Used  . Alcohol Use: No  . Drug Use: No  . Sexual Activity: Not on file   Other Topics Concern  . Not on file   Social History Narrative  . No narrative on file    Past Surgical History  Procedure Laterality Date  . Orif hip fracture      right  . Cholecystectomy    . Hemorrhoid surgery    . Total hip arthroplasty      right  . Abdominal hysterectomy    . 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 Abdominal laproscopy surgery      Family History    Problem Relation Age of Onset  . Heart disease Mother     MI  . Diabetes Mother   . Uterine cancer Mother   . Anemia Mother   . Hypertension Mother   . Stomach cancer Father   . Esophageal cancer Father   . Hypertension Father   . Diabetes Sister   . Anemia Sister   . Esophageal cancer Brother   . Hypertension Brother   . Diabetes Son   . Diabetes Son   . Brain cancer Brother   . Lung cancer Sister   . Liver cancer Sister   . Colon cancer Neg Hx   . Rectal cancer Neg Hx     No Known Allergies  Current Outpatient Prescriptions on File Prior to Visit  Medication Sig Dispense Refill  . albuterol (PROVENTIL) (2.5 MG/3ML) 0.083% nebulizer solution Take 3 mLs (2.5 mg total) by nebulization 4 (four) times daily.  360 mL  12  . beclomethasone (QVAR) 80 MCG/ACT inhaler Inhale 2 puffs into the lungs 2 (two) times daily.  1 Inhaler  6  . CRESTOR  20 MG tablet TAKE 1 TABLET BY MOUTH EVERY OTHER DAY.  90 tablet  1  . escitalopram (LEXAPRO) 10 MG tablet TAKE 1 TABLET BY MOUTH DAILY.  30 tablet  2  . Ferrous Sulfate Dried 200 (65 FE) MG TABS Take 200 mg by mouth daily.  30 tablet  6  . fluticasone (FLONASE) 50 MCG/ACT nasal spray Place 2 sprays into the nose daily as needed. For nasal drainage  16 g  6  . furosemide (LASIX) 40 MG tablet TAKE 1 TABLET (40 MG TOTAL) BY MOUTH DAILY.  30 tablet  5  . ibandronate (BONIVA) 150 MG tablet Take in the morning with a full glass of water, on an empty stomach,  4 tablet  3  . ipratropium (ATROVENT) 0.02 % nebulizer solution Take 2.5 mLs (0.5 mg total) by nebulization 4 (four) times daily.  300 mL  12  . losartan (COZAAR) 25 MG tablet TAKE 1 TABLET BY MOUTH DAILY.  90 tablet  1  . omeprazole (PRILOSEC) 20 MG capsule TAKE 1 CAPSULE BY MOUTH ONCE DAILY.  30 capsule  6  . potassium chloride SA (K-DUR,KLOR-CON) 20 MEQ tablet TAKE 1 TABLET BY MOUTH DAILY.  30 tablet  2  . PROAIR HFA 108 (90 BASE) MCG/ACT inhaler INHALE 2 PUFFS INTO THE LUNGS EVERY 4 HOURS AS  NEEDED FOR SHORTNESS OF BREATH  8.5 g  5  . rosuvastatin (CRESTOR) 20 MG tablet Take 1 tablet (20 mg total) by mouth every other day.  90 tablet  1  . traZODone (DESYREL) 100 MG tablet TAKE 1 TABLET BY MOUTH AT BEDTIME.  90 tablet  1   No current facility-administered medications on file prior to visit.    BP 146/70  Pulse 100  Temp(Src) 98.2 F (36.8 C) (Oral)  Resp 22  Ht 5\' 4"  (1.626 m)  Wt 200 lb (90.719 kg)  BMI 34.31 kg/m2  SpO2 90%       Review of Systems  Constitutional: Positive for fatigue.  HENT: Negative for congestion, dental problem, hearing loss, rhinorrhea, sinus pressure, sore throat and tinnitus.   Eyes: Negative for pain, discharge and visual disturbance.  Respiratory: Positive for cough and shortness of breath.   Cardiovascular: Negative for chest pain, palpitations and leg swelling.  Gastrointestinal: Negative for nausea, vomiting, abdominal pain, diarrhea, constipation, blood in stool and abdominal distention.  Genitourinary: Negative for dysuria, urgency, frequency, hematuria, flank pain, vaginal bleeding, vaginal discharge, difficulty urinating, vaginal pain and pelvic pain.  Musculoskeletal: Negative for arthralgias, gait problem and joint swelling.  Skin: Negative for rash.  Neurological: Negative for dizziness, syncope, speech difficulty, weakness, numbness and headaches.  Hematological: Negative for adenopathy.  Psychiatric/Behavioral: Negative for behavioral problems, dysphoric mood and agitation. The patient is not nervous/anxious.        Objective:   Physical Exam  Constitutional: She is oriented to person, place, and time. She appears well-developed and well-nourished.  Overweight No distress  nasal cannula O2 in place   HENT:  Head: Normocephalic.  Right Ear: External ear normal.  Left Ear: External ear normal.  Mouth/Throat: Oropharynx is clear and moist.  Eyes: Conjunctivae and EOM are normal. Pupils are equal, round, and reactive  to light.  Neck: Normal range of motion. Neck supple. No thyromegaly present.  Cardiovascular: Normal rate, regular rhythm, normal heart sounds and intact distal pulses.   Pulmonary/Chest: Effort normal.  Diminished breath sounds  Abdominal: Soft. Bowel sounds are normal. She exhibits no mass. There is no tenderness.  Musculoskeletal: Normal range of motion. She exhibits no edema.  Lymphadenopathy:    She has no cervical adenopathy.  Neurological: She is alert and oriented to person, place, and time.  Skin: Skin is warm and dry. No rash noted.  Psychiatric: She has a normal mood and affect. Her behavior is normal.          Assessment & Plan:   Advanced COPD. Oxygen dependent. Will order a hospital bed Hypertension well controlled Dyslipidemia. Continue statin therapy  Pulmonary followup Return here 6 months or as needed

## 2013-08-04 NOTE — Patient Instructions (Signed)
Limit your sodium (Salt) intake  Return in 6 months for follow-up  

## 2013-08-04 NOTE — Progress Notes (Signed)
Pre-visit discussion using our clinic review tool. No additional management support is needed unless otherwise documented below in the visit note.  

## 2013-08-05 ENCOUNTER — Telehealth: Payer: Self-pay | Admitting: Internal Medicine

## 2013-08-05 NOTE — Telephone Encounter (Signed)
Relevant patient education assigned to patient using Emmi. ° °

## 2013-08-09 ENCOUNTER — Telehealth: Payer: Self-pay | Admitting: Internal Medicine

## 2013-08-09 NOTE — Telephone Encounter (Signed)
Pt was seen on 08-04-13. Pt needs order for hospital bed w/ remote control send to [place]

## 2013-08-09 NOTE — Telephone Encounter (Signed)
Pt notified order for Hospital Bed faxed over to [facility]. Order faxed.

## 2013-09-17 ENCOUNTER — Other Ambulatory Visit: Payer: Self-pay | Admitting: Internal Medicine

## 2013-09-26 ENCOUNTER — Telehealth: Payer: Self-pay | Admitting: Internal Medicine

## 2013-09-26 NOTE — Telephone Encounter (Signed)
Roselyn Reef, called back he said everything is fine, the aide will be coming back out to assist patient she is with [facility]. Told him okay, if need anything else please call. Roselyn Reef verbalized understanding.

## 2013-09-26 NOTE — Telephone Encounter (Signed)
Pt's son-in-law states they need a new certification for pt to start home health care.

## 2013-09-26 NOTE — Telephone Encounter (Signed)
Called pt's son in law [place] asked him what they need. Roselyn Reef said pt was having and aide come out and help with care and meals and now was told by aide she can no longer come due to pt makes too much money. Told Roselyn Reef you need to contact [facility] and find out what has changed and what they need to do and then call me back and I will see what I can do. Roselyn Reef verbalized understanding.

## 2013-09-28 ENCOUNTER — Telehealth: Payer: Self-pay | Admitting: Internal Medicine

## 2013-09-28 NOTE — Telephone Encounter (Signed)
Patient Information:  Caller Name: Lyndel Pleasure  Phone: [phone]  Patient: Pamela Hampton, Pamela Hampton  Gender: Female  DOB: 01/23/1941  Age: 73 Years  PCP: Bluford Kaufmann (Family Practice > 4yrs old)  Office Follow Up:  Does the office need to follow up with this patient?: Yes  Instructions For The Office: No Vitamin B12 is showing on MAR. Patient states it has been over a year since she had injeciton.  Can get the medication OTC.  What is dosage recommendation. Please contact patient.  RN Note:  No Vitamin B12 is showing on MAR. Patient states it has been over a year since she had injeciton.  Can get the medication OTC.  What is dosage recommendation. Please contact patient.  Symptoms  Reason For Call & Symptoms: Patient states she was coming to the office to get Vitamin B12 shots. She was told to start taking OTC .When purchasing the medication OTC-  It comes in 1,000 mcg and 5,000 mcg. Which one does Dr. Raliegh Ip .want her to take?  It has been over a year since she received the B12 injections.  Reviewed Health History In EMR: Yes  Reviewed Medications In EMR: Yes  Reviewed Allergies In EMR: Yes  Reviewed Surgeries / Procedures: Yes  Date of Onset of Symptoms: 09/28/2013  Guideline(s) Used:  No Protocol Available - Information Only  Disposition Per Guideline:   Discuss with PCP and Callback by Nurse Today  Reason For Disposition Reached:   Nursing judgment  Advice Given:  Call Back If:  New symptoms develop  You become worse.  Patient Will Follow Care Advice:  YES

## 2013-09-28 NOTE — Telephone Encounter (Signed)
Left message on voicemail to call office. Dr. Elease Hashimoto said he would recommend 1,000 mcg daily.

## 2013-09-29 NOTE — Telephone Encounter (Signed)
Spoke to pt told her Dr. Raliegh Ip is out of the office but, Dr. Elease Hashimoto would recommend Vit B 12 1,000 mcg daily. Pt verbalized understanding.

## 2013-10-01 ENCOUNTER — Other Ambulatory Visit: Payer: Self-pay | Admitting: Internal Medicine

## 2013-10-03 ENCOUNTER — Telehealth: Payer: Self-pay | Admitting: Internal Medicine

## 2013-10-03 MED ORDER — DOXYCYCLINE HYCLATE 100 MG PO TABS: ORAL_TABLET | ORAL | Status: DC

## 2013-10-03 MED ORDER — PREDNISONE 10 MG PO TABS: ORAL_TABLET | ORAL | Status: DC

## 2013-10-03 NOTE — Telephone Encounter (Signed)
Spoke with pt and advised of Dr Golden Pop recommendations.  Rx sent to pharmacy requested.

## 2013-10-03 NOTE — Telephone Encounter (Signed)
Probably another AECOPD  You have mild attack of copd called COPD exacerbation Please take doxycycline 100mg  twice daily after meals x 5 days; avoid sunlight  Please take prednisone 40 mg x1 day, then 30 mg x1 day, then 20 mg x1 day, then 10 mg x1 day, and then 5 mg x1 day and stop  Go to ER if worse

## 2013-10-03 NOTE — Telephone Encounter (Signed)
Spoke with pt - c/o chest congestion and prod cough (clear) for past 3 days - SOB and wheezing - Denies fever - Using Albuterol/ Atrovent neb qid and Qvar 2 puffs bid.  Please advise No Known Allergies

## 2013-11-25 ENCOUNTER — Telehealth: Payer: Self-pay | Admitting: Internal Medicine

## 2013-11-25 NOTE — Telephone Encounter (Signed)
Per  nicole at [facility], Ophthalmologist  Address: [address] [place], [place] [address]  [phone] Pt needed a referral to see Sherlynn Stalls, MD sent to [facility] faxed over the referral awaiting authorization

## 2013-11-28 ENCOUNTER — Telehealth: Payer: Self-pay | Admitting: Internal Medicine

## 2013-11-28 NOTE — Telephone Encounter (Signed)
Received authorization back Authorization # [id number]- Dr Baird [facility] ,[place] 4 start date -11-25-2013 end 02-23-2014

## 2013-11-29 ENCOUNTER — Telehealth: Payer: Self-pay | Admitting: Internal Medicine

## 2013-11-29 DIAGNOSIS — H3530 Unspecified macular degeneration: Secondary | ICD-10-CM

## 2013-11-29 NOTE — Telephone Encounter (Signed)
Pt needs a [facility] referral to [facility] for [place], she was referred to them by Dr. Madilyn Hook.  (f) [phone]

## 2013-12-02 ENCOUNTER — Other Ambulatory Visit: Payer: Self-pay | Admitting: Internal Medicine

## 2013-12-09 ENCOUNTER — Other Ambulatory Visit: Payer: Self-pay | Admitting: Internal Medicine

## 2013-12-27 ENCOUNTER — Other Ambulatory Visit: Payer: Self-pay | Admitting: Internal Medicine

## 2014-01-27 ENCOUNTER — Other Ambulatory Visit: Payer: Self-pay | Admitting: Internal Medicine

## 2014-01-30 ENCOUNTER — Telehealth: Payer: Self-pay | Admitting: Internal Medicine

## 2014-01-30 MED ORDER — PREDNISONE 10 MG PO TABS: ORAL_TABLET | ORAL | Status: DC

## 2014-01-30 NOTE — Telephone Encounter (Signed)
Called spoke with patient who reports some increased SOB, chest tightness and prod cough with clear mucus over the weekend but noticed this worsened yesterday after "being out more" in the humid weather.  Pt denies any hemoptysis, f/c/s/n/v, PND, leg swelling or wheezing.  Pt declined ov this week but is requesting something be called in to her pharmacy.  She is due for appt w/ MR on 8.17.15 @ 4.15pm  Dr Chase Caller please advise, thank you. [facility] - request home delivery NKDA - verified  Last ov 12.1.14 w/ TP: Patient Instructions     Continue Albuterol and Ipratropium Neb Four times a day  Continue on QVAR 2 puffs Twice daily , rinse after use.  Continue on Oxygen 4l/m .  follow up Dr. Chase Caller in 3 months and As needed  Please contact office for sooner follow up if symptoms do not improve or worsen or seek emergency care

## 2014-01-30 NOTE — Telephone Encounter (Signed)
Recommend 5 days prednisone   Please take prednisone 40 mg x1 day, then 30 mg x1 day, then 20 mg x1 day, then 10 mg x1 day, and then 5 mg x1 day and stop  For heat related aecopd  If not responding she is to call back for abx  Dr. Brand Males, M.D., [facility].C.P Pulmonary and Critical Care Medicine Staff Physician [place] [facility] Pager: [phone], If no answer or between  15:00h - 7:00h: call [phone]  01/30/2014 5:18 PM

## 2014-02-02 ENCOUNTER — Ambulatory Visit (INDEPENDENT_AMBULATORY_CARE_PROVIDER_SITE_OTHER): Payer: Medicare HMO | Admitting: Internal Medicine

## 2014-02-02 ENCOUNTER — Encounter: Payer: Self-pay | Admitting: Internal Medicine

## 2014-02-02 ENCOUNTER — Telehealth: Payer: Self-pay

## 2014-02-02 VITALS — BP 147/58 | HR 85 | Temp 98.8°F

## 2014-02-02 DIAGNOSIS — D509 Iron deficiency anemia, unspecified: Secondary | ICD-10-CM

## 2014-02-02 DIAGNOSIS — I10 Essential (primary) hypertension: Secondary | ICD-10-CM

## 2014-02-02 DIAGNOSIS — E785 Hyperlipidemia, unspecified: Secondary | ICD-10-CM

## 2014-02-02 DIAGNOSIS — J449 Chronic obstructive pulmonary disease, unspecified: Secondary | ICD-10-CM

## 2014-02-02 DIAGNOSIS — Q2730 Arteriovenous malformation, site unspecified: Secondary | ICD-10-CM

## 2014-02-02 DIAGNOSIS — Q279 Congenital malformation of peripheral vascular system, unspecified: Secondary | ICD-10-CM

## 2014-02-02 LAB — CBC WITH DIFFERENTIAL/PLATELET
BASOS ABS: 0.1 10*3/uL (ref 0.0–0.1)
Basophils Relative: 0.4 % (ref 0.0–3.0)
Eosinophils Absolute: 0.1 10*3/uL (ref 0.0–0.7)
Eosinophils Relative: 0.4 % (ref 0.0–5.0)
Hemoglobin: 7.5 g/dL — CL (ref 12.0–15.0)
LYMPHS ABS: 2 10*3/uL (ref 0.7–4.0)
Lymphocytes Relative: 13.1 % (ref 12.0–46.0)
MCHC: 28.6 g/dL — ABNORMAL LOW (ref 30.0–36.0)
MCV: 73.7 fl — ABNORMAL LOW (ref 78.0–100.0)
Monocytes Absolute: 0.9 10*3/uL (ref 0.1–1.0)
Monocytes Relative: 5.7 % (ref 3.0–12.0)
Neutro Abs: 12.4 10*3/uL — ABNORMAL HIGH (ref 1.4–7.7)
Neutrophils Relative %: 80.4 % — ABNORMAL HIGH (ref 43.0–77.0)
PLATELETS: 407 10*3/uL — AB (ref 150.0–400.0)
RBC: 3.55 Mil/uL — ABNORMAL LOW (ref 3.87–5.11)
RDW: 23.3 % — AB (ref 11.5–15.5)
WBC: 15.4 10*3/uL — ABNORMAL HIGH (ref 4.0–10.5)

## 2014-02-02 LAB — LIPID PANEL
Cholesterol: 145 mg/dL (ref 0–200)
HDL: 60.6 mg/dL (ref >39.00)
LDL Cholesterol: 45 mg/dL (ref 0–99)
NONHDL: 84.4
Total CHOL/HDL Ratio: 2
Triglycerides: 197 mg/dL — ABNORMAL HIGH (ref 0.0–149.0)
VLDL: 39.4 mg/dL (ref 0.0–40.0)

## 2014-02-02 LAB — COMPREHENSIVE METABOLIC PANEL
ALT: 10 U/L (ref 0–35)
AST: 17 U/L (ref 0–37)
Albumin: 3.4 g/dL — ABNORMAL LOW (ref 3.5–5.2)
Alkaline Phosphatase: 57 U/L (ref 39–117)
BILIRUBIN TOTAL: 0.2 mg/dL (ref 0.2–1.2)
BUN: 15 mg/dL (ref 6–23)
CO2: 34 meq/L — AB (ref 19–32)
Calcium: 9.5 mg/dL (ref 8.4–10.5)
Chloride: 96 mEq/L (ref 96–112)
Creatinine, Ser: 0.8 mg/dL (ref 0.4–1.2)
GFR: 81.01 mL/min (ref >60.00)
Glucose, Bld: 98 mg/dL (ref 70–99)
POTASSIUM: 3.8 meq/L (ref 3.5–5.1)
SODIUM: 140 meq/L (ref 135–145)
TOTAL PROTEIN: 7.1 g/dL (ref 6.0–8.3)

## 2014-02-02 LAB — TSH: TSH: 0.51 u[IU]/mL (ref 0.35–4.50)

## 2014-02-02 NOTE — Telephone Encounter (Signed)
Dr. Raliegh Ip is aware that pt's hemoglobin is 7.5, labs will be faxed over to [place]. I spoke with pt and advised per Dr. Raliegh Ip that she will need a blood transfusion. We will get back in touch in the morning with pt to get this set up, it is most likely to late this evening to schedule for this.

## 2014-02-02 NOTE — Patient Instructions (Signed)
Complete prednisone taper Pulmonary follow up as scheduled    Use saline irrigation, warm  moist compresses and over-the-counter decongestants only as directed.  Call if there is no improvement in 5 to 7 days, or sooner if you develop increasing pain, fever, or any new symptoms.  Continue fluticasone nasal spray

## 2014-02-02 NOTE — Telephone Encounter (Signed)
Shaneequah from [facility] called and pt has critical lab hemoglobin is 7.5.  Labs will be faxed over.

## 2014-02-02 NOTE — Telephone Encounter (Signed)
Notify patient that she has developed significant anemia and will need blood transfusions Please schedule for blood transfusion of 2 units of packed RBCs tomorrow  Return office visit early next week

## 2014-02-02 NOTE — Progress Notes (Signed)
Subjective:    Patient ID: Pamela Hampton, female    DOB: November 24, 1940, 73 y.o.   MRN: [id number]  HPI   73 year old patient who is seen today for her six-month followup.  She has a history of oxygen-dependent COPD, and will be seen and followed by pulmonary medicine next month.  She presently is on a prednisone taper due to increasing shortness of breath.  Her chief complaint today is some sinus congestion.  No fever or purulent drainage.  No wheezing.  She has treated hypertension, dyslipidemia.  Past Medical History  Diagnosis Date  . Allergy     Rhinitis  . Depression   . GERD (gastroesophageal reflux disease)     Barrett's esophagus  . Hyperlipidemia   . Hypertension   . Osteoporosis   . Seizures   . Barrett's esophagus 07/19/2004  . Anemia   . Obesity   . Pneumonia   . Stroke   . Sleep apnea   . Hepatitis   . Arthritis   . Blood transfusion   . COPD (chronic obstructive pulmonary disease)     wears 3 liters of o2 continious  . Emphysema of lung   . Ulcer   . Vaginal cancer   . Hiatal hernia     History   Social History  . Marital Status: Divorced    Spouse Name: N/A    Number of Children: 30  . Years of Education: N/A   Occupational History  . disaled    Social History Main Topics  . Smoking status: Former Smoker -- 2.00 packs/day for 30 years    Types: Cigarettes    Quit date: 07/28/2002  . Smokeless tobacco: Never Used  . Alcohol Use: No  . Drug Use: No  . Sexual Activity: Not on file   Other Topics Concern  . Not on file   Social History Narrative  . No narrative on file    Past Surgical History  Procedure Laterality Date  . Orif hip fracture      right  . Cholecystectomy    . Hemorrhoid surgery    . Total hip arthroplasty      right  . Abdominal hysterectomy    . 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 Abdominal laproscopy surgery      Family History  Problem Relation Age of  Onset  . Heart disease Mother     MI  . Diabetes Mother   . Uterine cancer Mother   . Anemia Mother   . Hypertension Mother   . Stomach cancer Father   . Esophageal cancer Father   . Hypertension Father   . Diabetes Sister   . Anemia Sister   . Esophageal cancer Brother   . Hypertension Brother   . Diabetes Son   . Diabetes Son   . Brain cancer Brother   . Lung cancer Sister   . Liver cancer Sister   . Colon cancer Neg Hx   . Rectal cancer Neg Hx     No Known Allergies  Current Outpatient Prescriptions on File Prior to Visit  Medication Sig Dispense Refill  . albuterol (PROVENTIL) (2.5 MG/3ML) 0.083% nebulizer solution Take 3 mLs (2.5 mg total) by nebulization 4 (four) times daily.  360 mL  12  . beclomethasone (QVAR) 80 MCG/ACT inhaler Inhale 2 puffs into the lungs 2 (two) times daily.  1 Inhaler  6  . CRESTOR 20 MG tablet TAKE  1 TABLET BY MOUTH EVERY OTHER DAY.  90 tablet  1  . doxycycline (VIBRA-TABS) 100 MG tablet Take 1 tablet twice daily after meals - Avoid sunlight  10 tablet  0  . escitalopram (LEXAPRO) 10 MG tablet TAKE 1 TABLET BY MOUTH DAILY.  30 tablet  5  . Ferrous Sulfate Dried 200 (65 FE) MG TABS Take 200 mg by mouth daily.  30 tablet  6  . fluticasone (FLONASE) 50 MCG/ACT nasal spray PLACE 2 SPRAYS INTO THE NOSE DAILY AS NEEDED FOR NASAL DRAINAGE  16 g  5  . furosemide (LASIX) 40 MG tablet TAKE 1 TABLET (40 MG TOTAL) BY MOUTH DAILY.  30 tablet  5  . furosemide (LASIX) 40 MG tablet TAKE 1 TABLET BY MOUTH DAILY.  30 tablet  5  . ibandronate (BONIVA) 150 MG tablet Take in the morning with a full glass of water, on an empty stomach,  4 tablet  3  . ipratropium (ATROVENT) 0.02 % nebulizer solution Take 2.5 mLs (0.5 mg total) by nebulization 4 (four) times daily.  300 mL  12  . losartan (COZAAR) 25 MG tablet TAKE 1 TABLET BY MOUTH DAILY.  90 tablet  1  . omeprazole (PRILOSEC) 20 MG capsule TAKE 1 CAPSULE BY MOUTH ONCE DAILY.  30 capsule  6  . potassium chloride SA  (K-DUR,KLOR-CON) 20 MEQ tablet TAKE 1 TABLET BY MOUTH DAILY.  30 tablet  5  . predniSONE (DELTASONE) 10 MG tablet Take 4 tabs/day for 1 day, 3 tabs/day for 1 day, 2 tabs/day for 1 day, 1 tab/day for 1 day, 1/2 tab/day for 1 day then stop.  11 tablet  0  . PROAIR HFA 108 (90 BASE) MCG/ACT inhaler INHALE 2 PUFFS INTO THE LUNGS EVERY 4 HOURS AS NEEDED FOR SHORTNESS OF BREATH  8.5 g  5  . rosuvastatin (CRESTOR) 20 MG tablet Take 1 tablet (20 mg total) by mouth every other day.  90 tablet  1  . traZODone (DESYREL) 100 MG tablet TAKE 1 TABLET BY MOUTH AT BEDTIME.  90 tablet  1   No current facility-administered medications on file prior to visit.    BP 147/58  Pulse 85  Temp(Src) 98.8 F (37.1 C)  SpO2 91%       Review of Systems  Constitutional: Negative.   HENT: Positive for congestion. Negative for dental problem, hearing loss, rhinorrhea, sinus pressure, sore throat and tinnitus.   Eyes: Negative for pain, discharge and visual disturbance.  Respiratory: Positive for shortness of breath. Negative for cough.   Cardiovascular: Negative for chest pain, palpitations and leg swelling.  Gastrointestinal: Negative for nausea, vomiting, abdominal pain, diarrhea, constipation, blood in stool and abdominal distention.  Genitourinary: Negative for dysuria, urgency, frequency, hematuria, flank pain, vaginal bleeding, vaginal discharge, difficulty urinating, vaginal pain and pelvic pain.  Musculoskeletal: Negative for arthralgias, gait problem and joint swelling.  Skin: Negative for rash.  Neurological: Negative for dizziness, syncope, speech difficulty, weakness, numbness and headaches.  Hematological: Negative for adenopathy.  Psychiatric/Behavioral: Negative for behavioral problems, dysphoric mood and agitation. The patient is not nervous/anxious.        Objective:   Physical Exam  Constitutional: She is oriented to person, place, and time. She appears well-developed and well-nourished. No  distress.  Wheelchair-bound Nasal cannula oxygen in place Blood pressure 130/60 O2 saturation 91%  HENT:  Head: Normocephalic.  Right Ear: External ear normal.  Left Ear: External ear normal.  Mouth/Throat: Oropharynx is clear and moist.  Eyes:  Conjunctivae and EOM are normal. Pupils are equal, round, and reactive to light.  Neck: Normal range of motion. Neck supple. No thyromegaly present.  Cardiovascular: Normal rate, regular rhythm, normal heart sounds and intact distal pulses.   Pulmonary/Chest: Effort normal.  Generally decreased  Breath sounds with a few coarse rhonchi.  No active wheezing No increased work of breathing  Abdominal: Soft. Bowel sounds are normal. She exhibits no mass. There is no tenderness.  Musculoskeletal: Normal range of motion. She exhibits no edema.  Lymphadenopathy:    She has no cervical adenopathy.  Neurological: She is alert and oriented to person, place, and time.  Skin: Skin is warm and dry. No rash noted.  Psychiatric: She has a normal mood and affect. Her behavior is normal.          Assessment & Plan:   COPD we'll complete prednisone taper Sinus congestion. Hypertension well controlled History of anemia  We'll check laboratory update Pulmonary followup as scheduled

## 2014-02-02 NOTE — Progress Notes (Signed)
Pre visit review using our clinic review tool, if applicable. No additional management support is needed unless otherwise documented below in the visit note. 

## 2014-02-03 ENCOUNTER — Non-Acute Institutional Stay (HOSPITAL_COMMUNITY)
Admission: AD | Admit: 2014-02-03 | Discharge: 2014-02-03 | Disposition: A | Discharge status: Home / Self Care | Payer: Medicare HMO | Source: Ambulatory Visit | Attending: Family Medicine | Admitting: Family Medicine

## 2014-02-03 DIAGNOSIS — D509 Iron deficiency anemia, unspecified: Secondary | ICD-10-CM | POA: Diagnosis not present

## 2014-02-03 NOTE — Telephone Encounter (Signed)
Called pt and told her 10:00 AM. Pt said can she come at 11:00 due to ride coming from [place]. Told her I will call and see and call you back. Pt verbalized understanding.   Called Charlene and asked if pt can come a little later due to transportation, around 11:00? Randell Patient said that is fine. Told her okay.  Called pt back told her 11:00 AM is fine, told pt need to go to [place], 3rd floor SIckle Cell Dept. Pt verbalized understanding.

## 2014-02-03 NOTE — Procedures (Signed)
Tolerated Well. No complaints. VSS. Patient discharged home. Patient in no apparent distress. Instructed patient to seek medical attention if any changes in condition, patient acknowledges. Patient left day hospital with belongings via wheelchair escorted by family member.

## 2014-02-03 NOTE — Telephone Encounter (Signed)
Orders faxed, received call back from [place] can see pt today at 10:00 AM for Blood Transfusion. Told her okay will contact pt and make sure she can come at the time and call back. Charlene verbalized understanding.

## 2014-02-03 NOTE — Telephone Encounter (Signed)
Spoke to pt asked her if she was contacted yesterday regarding Hemoglobin very low and needs blood transfusion. Pt said yes, last evening a nurse called her. Told her okay, I am working on orders now for it to be done today and will call back with time and place. Pt verbalized understanding.

## 2014-02-03 NOTE — Telephone Encounter (Signed)
Called [facility] short stay and spoke to [facility], asked if they could do a blood transfusion today. Pamela Hampton said no , not till Monday. Told her pt can not wait. She said try [facility] stay. Called [facility] and spoke to Pamela Hampton. She said they are unavailable also, can try Sickle Cell dept. Told her okay. Pamela Hampton Cell Dept and spoke to [place], asked her if they could do a transfusion today. Pamela Hampton said yes, will fax order sheet over. Told her okay, will fill out and fax back. Pamela Hampton verbalized understanding.

## 2014-02-03 NOTE — Discharge Instructions (Signed)

## 2014-02-06 LAB — TYPE AND SCREEN
ABO/RH(D): O POS
ANTIBODY SCREEN: NEGATIVE
Unit division: 0
Unit division: 0

## 2014-02-07 ENCOUNTER — Telehealth: Payer: Self-pay | Admitting: Internal Medicine

## 2014-02-07 NOTE — Telephone Encounter (Signed)
Son states the hospital told pt she should fu w/ pcp after her blood infusion. Next appt is Jan.2016  W/ dr Raliegh Ip. Son would like to know if pt should even wait that long? Pls advise on when to return

## 2014-02-07 NOTE — Telephone Encounter (Signed)
Please schedule a follow-up appointment next week

## 2014-02-07 NOTE — Telephone Encounter (Signed)
Please advise when pt needs follow up since blood transfusion.

## 2014-02-08 NOTE — Telephone Encounter (Signed)
Spoke to Estelle pt's son, told him Dr. Raliegh Ip would like her to follow up next week. Nicole Kindred verbalized understanding.

## 2014-03-13 ENCOUNTER — Ambulatory Visit (INDEPENDENT_AMBULATORY_CARE_PROVIDER_SITE_OTHER): Payer: Commercial Managed Care - HMO | Admitting: Internal Medicine

## 2014-03-13 ENCOUNTER — Encounter: Payer: Self-pay | Admitting: Internal Medicine

## 2014-03-13 VITALS — BP 128/80 | HR 84 | Ht 63.0 in | Wt 202.0 lb

## 2014-03-13 DIAGNOSIS — J9610 Chronic respiratory failure, unspecified whether with hypoxia or hypercapnia: Secondary | ICD-10-CM | POA: Diagnosis not present

## 2014-03-13 DIAGNOSIS — H9209 Otalgia, unspecified ear: Secondary | ICD-10-CM | POA: Diagnosis not present

## 2014-03-13 DIAGNOSIS — J4489 Other specified chronic obstructive pulmonary disease: Secondary | ICD-10-CM

## 2014-03-13 DIAGNOSIS — J449 Chronic obstructive pulmonary disease, unspecified: Secondary | ICD-10-CM | POA: Diagnosis not present

## 2014-03-13 DIAGNOSIS — J9611 Chronic respiratory failure with hypoxia: Secondary | ICD-10-CM | POA: Insufficient documentation

## 2014-03-13 DIAGNOSIS — R0902 Hypoxemia: Secondary | ICD-10-CM

## 2014-03-13 NOTE — Patient Instructions (Addendum)
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  Continue on Oxygen 4l/m .  follow up Dr. Chase Caller in 6 months and As needed   Please contact office for sooner follow up if symptoms do not improve or worsen or seek emergency care  #Recurrent sinus and nasal stuffiness and otalgia  - refer [facility] ENT or Dr Elgie Congo    #Followup  6 months or sooner if needed ; wil discuss lung cancer screen at followup

## 2014-03-13 NOTE — Progress Notes (Signed)
Subjective:    Patient ID: Pamela Hampton, female    DOB: 1941-06-09, 73 y.o.   MRN: [id number]  HPI  PCP Nyoka Cowden, MD  # 60 pack smoker quit 2004.   # Frequent UTI. Gyn cancer 1998  -s/p chemo Rx and. xrt = strong family hx of cancer  #lung cancer screen  - Feb 2013: No evicende of cancer on CT Chest  #Obesity  - >  30# weight gain since 2010-2012  - Body mass index is 34.86 kg/(m^2).   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 Ratio 51 and DLCO 6.7/30\ Status post pulmonary rehabilitation summer 2013   #AECOPD - September 2013 treated in the office with doxycycline and prednisone on 04/14/2012 - December 2013 4 emergency room treatment with prednisone and antibiotics - July 2014 office treatment with Levaquin and prednisone - Nov 2014: office Rx - March 2015: office telpehone Rx   ...................... OV 03/13/2014  Chief Complaint  Patient presents with  . Follow-up    Pt states her breathing is unchanged. Pt c/o DOE, prod cough with green and white. pt states her cough is worse in monring and when outdoors. Pt states when she becomes very dyspneic then she will experience mid sternal  CP. Pt states she received 2 units of blood in 01/2014 d/t hgb at 7.5   FU COPD with cor pulmonale  - doing well. NO admissions since nov/dec 2014 when last seen. [facility]: Rx over phone for aecopd. Continues qvar and duoneb without a problem. On 4L o2.   Past hx: has chronic anemia and 4 wweeks ago PCP Nyoka Cowden, MD gave 2 units prbc for hgg 7s. Feels better after that. Also reports chronic nasal stuffiness and head cold and wants ENT referral    CAT COPD Symptom & Quality of Life Score ([place]) 0 is no burden. 5 is highest  burden 04/14/2012  08/03/2012  02/14/2013  06/10/2013   Never Cough -> Cough all the time 1 4, baseline 2 4  No phlegm in chest -> Chest is full of phlegm 5 3, baseline 5 4  No chest tightness -> Chest feels very tight 1 0, /imrpoved 4 4  No dyspnea for 1 flight stairs/hill -> Very dyspneic for 1 flight of stairs 5 5, "improved" 5 5  No limitations for ADL at home -> Very limited with ADL at home 3 5 5 5   Confident leaving home -> Not at all confident leaving home 0 2 3 4   Sleep soundly -> Do not sleep soundly because of lung condition 3 4 4 5   Lots of Energy -> No energy at all 3 5 5 5   TOTAL Score (max 40)  21 28 32 36     Review of Systems  Constitutional: Negative for fever and unexpected weight change.  HENT: Positive for congestion. Negative for dental problem, ear pain, nosebleeds, postnasal drip, rhinorrhea, sinus pressure, sneezing, sore throat and trouble swallowing.   Eyes: Negative for redness and itching.  Respiratory: Positive for cough and shortness of breath. Negative for chest tightness and wheezing.   Cardiovascular: Positive for chest pain. Negative for palpitations and leg swelling.  Gastrointestinal: Negative for nausea and vomiting.  Genitourinary: Negative for dysuria.  Musculoskeletal: Negative for joint swelling.  Skin: Negative for  rash.  Neurological: Negative for headaches.  Hematological: Does not bruise/bleed easily.  Psychiatric/Behavioral: Negative for dysphoric mood. The patient is not nervous/anxious.        Objective:   Physical Exam  Filed Vitals:   03/13/14 1623  BP: 128/80  Pulse: 84  Height: 5\' 3"  (1.6 m)  Weight: 202 lb (91.627 kg)  SpO2: 93%    GEN: A/Ox3; pleasant , NAD, elderly - sitting in wheelchair  HEENT:  [place]/AT,  EACs-clear, TMs-wnl, NOSE-clear, THROAT-clear, no lesions, no postnasal drip or exudate noted.   NECK:  Supple w/ fair ROM; no JVD; normal carotid impulses w/o bruits; no thyromegaly or nodules palpated; no  lymphadenopathy.  RESP  Diminished BS in bases , no accessory muscle use, no dullness to percussion  CARD:  RRR, no m/r/g  , no peripheral edema, pulses intact, no cyanosis or clubbing.  GI:   Soft & nt; nml bowel sounds; no organomegaly or masses detected.  Musco: Warm bil, no deformities or joint swelling noted.   Neuro: alert, no focal deficits noted.    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  Continue on Oxygen 4l/m .  follow up Dr. Chase Caller in 6 months and As needed   Please contact office for sooner follow up if symptoms do not improve or worsen or seek emergency care  #Recurrent sinus and nasal stuffiness and otalgia  - refer [facility] ENT or Dr Elgie Congo    #Followup  6 months or sooner if needed ; wil discuss lung cancer screen at followup

## 2014-03-15 ENCOUNTER — Telehealth: Payer: Self-pay | Admitting: Internal Medicine

## 2014-03-15 NOTE — Telephone Encounter (Signed)
[place], [place] is requesting re-fill on potassium chloride SA (K-DUR,KLOR-CON) 20 MEQ tablet

## 2014-03-16 MED ORDER — POTASSIUM CHLORIDE CRYS ER 20 MEQ PO TBCR: EXTENDED_RELEASE_TABLET | ORAL | Status: DC

## 2014-03-16 NOTE — Telephone Encounter (Signed)
Rx sent 

## 2014-03-20 DIAGNOSIS — H9209 Otalgia, unspecified ear: Secondary | ICD-10-CM | POA: Insufficient documentation

## 2014-03-20 NOTE — Assessment & Plan Note (Signed)
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  Continue on Oxygen 4l/m .  follow up Dr. Chase Caller in 6 months and As needed   Please contact office for sooner follow up if symptoms do not improve or worsen or seek emergency care   #Followup  6 months or sooner if needed ; wil discuss lung cancer screen at followup

## 2014-03-20 NOTE — Assessment & Plan Note (Signed)
#  Recurrent sinus and nasal stuffiness and otalgia  - refer [facility] ENT or Dr Elgie Congo

## 2014-03-24 ENCOUNTER — Other Ambulatory Visit: Payer: Self-pay | Admitting: Internal Medicine

## 2014-04-14 ENCOUNTER — Other Ambulatory Visit: Payer: Self-pay | Admitting: Internal Medicine

## 2014-05-19 ENCOUNTER — Other Ambulatory Visit: Payer: Self-pay | Admitting: Internal Medicine

## 2014-05-23 ENCOUNTER — Encounter: Payer: Self-pay | Admitting: Family Medicine

## 2014-05-23 ENCOUNTER — Ambulatory Visit (INDEPENDENT_AMBULATORY_CARE_PROVIDER_SITE_OTHER): Payer: Commercial Managed Care - HMO | Admitting: Family Medicine

## 2014-05-23 VITALS — BP 122/70 | HR 89 | Temp 97.4°F | Ht 63.0 in

## 2014-05-23 DIAGNOSIS — R300 Dysuria: Secondary | ICD-10-CM

## 2014-05-23 LAB — POCT URINALYSIS DIPSTICK
Bilirubin, UA: NEGATIVE
Glucose, UA: NEGATIVE
Ketones, UA: NEGATIVE
NITRITE UA: NEGATIVE
SPEC GRAV UA: 1.01
UROBILINOGEN UA: 0.2
pH, UA: 6

## 2014-05-23 MED ORDER — CEPHALEXIN 500 MG PO CAPS: 500.0000 mg | ORAL_CAPSULE | Freq: Three times a day (TID) | ORAL | Status: DC

## 2014-05-23 NOTE — Progress Notes (Signed)
No chief complaint on file.   HPI:  Acute visit for:  1) Dysuria: -she is concerned for UTI -started about the last few days -symptoms: frequency, urgency, burning -denies: fevers, chills, flank pain, nausea, vomiting, pelvic pain, hematuria -reports hx of UTI  ROS: See pertinent positives and negatives per HPI.  Past Medical History  Diagnosis Date  . Allergy     Rhinitis  . Depression   . GERD (gastroesophageal reflux disease)     Barrett's esophagus  . Hyperlipidemia   . Hypertension   . Osteoporosis   . Seizures   . Barrett's esophagus 07/19/2004  . Anemia   . Obesity   . Pneumonia   . Stroke   . Sleep apnea   . Hepatitis   . Arthritis   . Blood transfusion   . COPD (chronic obstructive pulmonary disease)     wears 3 liters of o2 continious  . Emphysema of lung   . Ulcer   . Vaginal cancer   . Hiatal hernia     Past Surgical History  Procedure Laterality Date  . Orif hip fracture      right  . Cholecystectomy    . Hemorrhoid surgery    . Total hip arthroplasty      right  . Abdominal hysterectomy    . 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 Abdominal laproscopy surgery      Family History  Problem Relation Age of Onset  . Heart disease Mother     MI  . Diabetes Mother   . Uterine cancer Mother   . Anemia Mother   . Hypertension Mother   . Stomach cancer Father   . Esophageal cancer Father   . Hypertension Father   . Diabetes Sister   . Anemia Sister   . Esophageal cancer Brother   . Hypertension Brother   . Diabetes Son   . Diabetes Son   . Brain cancer Brother   . Lung cancer Sister   . Liver cancer Sister   . Colon cancer Neg Hx   . Rectal cancer Neg Hx     History   Social History  . Marital Status: Divorced    Spouse Name: N/A    Number of Children: 83  . Years of Education: N/A   Occupational History  . disaled    Social History Main Topics  . Smoking status:  Former Smoker -- 2.00 packs/day for 30 years    Types: Cigarettes    Quit date: 07/28/2002  . Smokeless tobacco: Never Used  . Alcohol Use: No  . Drug Use: No  . Sexual Activity: None   Other Topics Concern  . None   Social History Narrative  . None    Current outpatient prescriptions:albuterol (PROVENTIL) (2.5 MG/3ML) 0.083% nebulizer solution, Take 3 mLs (2.5 mg total) by nebulization 4 (four) times daily., Disp: 360 mL, Rfl: 12;  CRESTOR 20 MG tablet, TAKE 1 TABLET BY MOUTH EVERY OTHER DAY., Disp: 90 tablet, Rfl: 1;  cyclobenzaprine (FLEXERIL) 10 MG tablet, Take 10 mg by mouth at bedtime., Disp: , Rfl:  escitalopram (LEXAPRO) 10 MG tablet, TAKE 1 TABLET BY MOUTH DAILY., Disp: 30 tablet, Rfl: 5;  Ferrous Sulfate Dried 200 (65 FE) MG TABS, Take 200 mg by mouth daily., Disp: 30 tablet, Rfl: 6;  furosemide (LASIX) 40 MG tablet, TAKE 1 TABLET (40 MG TOTAL) BY MOUTH DAILY., Disp: 30 tablet, Rfl: 5;  ibandronate (  BONIVA) 150 MG tablet, Take in the morning with a full glass of water, on an empty stomach,, Disp: 4 tablet, Rfl: 3 ipratropium (ATROVENT) 0.02 % nebulizer solution, Take 2.5 mLs (0.5 mg total) by nebulization 4 (four) times daily., Disp: 300 mL, Rfl: 12;  losartan (COZAAR) 25 MG tablet, TAKE 1 TABLET BY MOUTH DAILY., Disp: 90 tablet, Rfl: 1;  omeprazole (PRILOSEC) 20 MG capsule, TAKE 1 CAPSULE BY MOUTH ONCE DAILY., Disp: 30 capsule, Rfl: 6;  potassium chloride SA (K-DUR,KLOR-CON) 20 MEQ tablet, TAKE 1 TABLET BY MOUTH DAILY., Disp: 30 tablet, Rfl: 5 PROAIR HFA 108 (90 BASE) MCG/ACT inhaler, INHALE 2 PUFFS INTO THE LUNGS EVERY 4 HOURS AS NEEDED FOR SHORTNESS OF BREATH, Disp: 8.5 g, Rfl: 5;  QVAR 80 MCG/ACT inhaler, INHALE 2 PUFFS INTO THE LUNGS 2 (TWO) TIMES DAILY., Disp: 8.7 g, Rfl: 6;  sodium chloride (AYR) 0.65 % nasal spray, Place 1 spray into the nose as needed for congestion., Disp: , Rfl: ;  traZODone (DESYREL) 100 MG tablet, TAKE 1 TABLET BY MOUTH AT BEDTIME., Disp: 90 tablet, Rfl:  1 cephALEXin (KEFLEX) 500 MG capsule, Take 1 capsule (500 mg total) by mouth 3 (three) times daily., Disp: 21 capsule, Rfl: 0  EXAM:  Filed Vitals:   05/23/14 1605  BP: 122/70  Pulse: 89  Temp: 97.4 F (36.3 C)    Body mass index is 0.00 kg/(m^2).  GENERAL: vitals reviewed and listed above, alert, oriented, appears well hydrated and in no acute distress  HEENT: atraumatic, conjunttiva clear, no obvious abnormalities on inspection of external nose and ears  NECK: no obvious masses on inspection  ABD: no CVA TTP  MS: moves all extremities without noticeable abnormality  PSYCH: pleasant and cooperative, no obvious depression or anxiety  ASSESSMENT AND PLAN:  Discussed the following assessment and plan:  Dysuria - Plan: POC Urinalysis Dipstick, Culture, Urine, cephALEXin (KEFLEX) 500 MG capsule  -udip and symptoms suggestive of UTI - she opted for empiric tx with keflex -Patient advised to return or notify a doctor immediately if symptoms worsen or persist or new concerns arise.  Patient Instructions  BEFORE YOU LEAVE: -ensure you have follow up with Dr. Burnice Logan for your routine care  Get the antibiotic on the way home. Follow up with your doctor if symptoms persist or worsen.       Colin Benton R.

## 2014-05-23 NOTE — Patient Instructions (Signed)
BEFORE YOU LEAVE: -ensure you have follow up with Dr. Burnice Logan for your routine care  Get the antibiotic on the way home. Follow up with your doctor if symptoms persist or worsen.

## 2014-05-23 NOTE — Progress Notes (Signed)
Pre visit review using our clinic review tool, if applicable. No additional management support is needed unless otherwise documented below in the visit note. 

## 2014-05-27 LAB — URINE CULTURE: Colony Count: 30000

## 2014-05-29 MED ORDER — CIPROFLOXACIN HCL 250 MG PO TABS: 250.0000 mg | ORAL_TABLET | Freq: Two times a day (BID) | ORAL | Status: DC

## 2014-05-29 NOTE — Addendum Note (Signed)
Addended by: Agnes Lawrence on: 05/29/2014 08:38 AM   Modules accepted: Orders

## 2014-06-05 ENCOUNTER — Other Ambulatory Visit: Payer: Self-pay | Admitting: Internal Medicine

## 2014-06-19 ENCOUNTER — Telehealth: Payer: Self-pay | Admitting: Internal Medicine

## 2014-06-19 MED ORDER — CIPROFLOXACIN HCL 500 MG PO TABS: 500.0000 mg | ORAL_TABLET | Freq: Two times a day (BID) | ORAL | Status: DC

## 2014-06-19 NOTE — Telephone Encounter (Signed)
Pt notified Rx sent to pharmacy

## 2014-06-19 NOTE — Telephone Encounter (Signed)
Generic Cipro 500, #6 one twice a day 

## 2014-06-19 NOTE — Telephone Encounter (Signed)
Pt was treated for bladder inf on 05-23-14. Pt unable to come in  Due to no transporation. Pt would like abx call into [facility] .[phone] Pt has another bladder inf.

## 2014-06-19 NOTE — Telephone Encounter (Signed)
Please advise 

## 2014-06-26 ENCOUNTER — Ambulatory Visit (INDEPENDENT_AMBULATORY_CARE_PROVIDER_SITE_OTHER): Payer: Commercial Managed Care - HMO | Admitting: Family Medicine

## 2014-06-26 ENCOUNTER — Encounter: Payer: Self-pay | Admitting: Family Medicine

## 2014-06-26 ENCOUNTER — Telehealth: Payer: Self-pay | Admitting: Internal Medicine

## 2014-06-26 VITALS — BP 134/70 | HR 86 | Temp 98.2°F | Ht 63.0 in

## 2014-06-26 DIAGNOSIS — R300 Dysuria: Secondary | ICD-10-CM

## 2014-06-26 DIAGNOSIS — N939 Abnormal uterine and vaginal bleeding, unspecified: Secondary | ICD-10-CM

## 2014-06-26 DIAGNOSIS — B3731 Acute candidiasis of vulva and vagina: Secondary | ICD-10-CM

## 2014-06-26 DIAGNOSIS — C52 Malignant neoplasm of vagina: Secondary | ICD-10-CM

## 2014-06-26 DIAGNOSIS — B373 Candidiasis of vulva and vagina: Secondary | ICD-10-CM

## 2014-06-26 LAB — POCT URINALYSIS DIPSTICK
Bilirubin, UA: NEGATIVE
GLUCOSE UA: NEGATIVE
Ketones, UA: NEGATIVE
Nitrite, UA: NEGATIVE
SPEC GRAV UA: 1.01
Urobilinogen, UA: 0.2
pH, UA: 6.5

## 2014-06-26 MED ORDER — CIPROFLOXACIN HCL 250 MG PO TABS: 250.0000 mg | ORAL_TABLET | Freq: Two times a day (BID) | ORAL | Status: DC

## 2014-06-26 MED ORDER — FLUCONAZOLE 150 MG PO TABS: 150.0000 mg | ORAL_TABLET | Freq: Once | ORAL | Status: DC

## 2014-06-26 NOTE — Telephone Encounter (Signed)
Patient Information:  Caller Name: Breena  Phone: [phone]  Patient: Pamela Hampton, Pamela Hampton  Gender: Female  DOB: 1940-08-10  Age: 73 Years  PCP: Bluford Kaufmann (Family Practice > 27yrs old)  Office Follow Up:  Does the office need to follow up with this patient?: No  Instructions For The Office: N/A   Symptoms  Reason For Call & Symptoms: Pt is calling and states that she was being treated for a UTI but now she thinks that she may be vaginal bleeding but she is unsure if it is due to her UTI or vaginal area; pt is very hard of hearing and very difficult to triage; pt completed antibiotic on 06/22/14 for a UTI; per nursing judgement, appt made for pt to be seen due to uncertain if these are urinary or vaginal issues; the blood is noted on the toilet paper only when she wipes after urinating; not wearing a pad;  UTI sx started 06/19/14; ? vaginal bleeding on 06/22/14  Reviewed Health History In EMR: Yes  Reviewed Medications In EMR: Yes  Reviewed Allergies In EMR: Yes  Reviewed Surgeries / Procedures: Yes  Date of Onset of Symptoms: 06/22/2014  Guideline(s) Used:  No Protocol Available - Sick Adult  Disposition Per Guideline:   See Today in Office  Reason For Disposition Reached:   Nursing judgment  Advice Given:  Call Back If:  You become worse.  Patient Will Follow Care Advice:  YES  Appointment Scheduled:  06/26/2014 15:00:00 Appointment Scheduled Provider:  Maudie Mercury (TEXT 1st, after 20 mins can call), Jarrett Soho [facility]) Office called and instructed that this appt would be ok to schedule in.

## 2014-06-26 NOTE — Patient Instructions (Signed)
-  take the antibiotic for 5 days and the yeast medication  -We placed a referral for you as discussed to the gynecologist for the possible bleeding and the history of vaginal cancer. It usually takes about 1-2 weeks to process and schedule this referral. If you have not heard from [place] regarding this appointment in 2 weeks please contact our office.  -if you have persistent urinary symptoms or recurrent infections I would advise that you discus with your doctor, Dr. Burnice Logan and consider seeing a urologist

## 2014-06-26 NOTE — Progress Notes (Signed)
Pre visit review using our clinic review tool, if applicable. No additional management support is needed unless otherwise documented below in the visit note. 

## 2014-06-26 NOTE — Progress Notes (Addendum)
HPI:  Acute visit for:  ? Vaginal bleeding and dysuria: -hx recurrent UTIs -after last visit I advised she follow up with her PCP for consideration of urology eval given frequent recurrent UTIs -now ? 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  Past Medical History  Diagnosis Date  . Allergy     Rhinitis  . Depression   . GERD (gastroesophageal reflux disease)     Barrett's esophagus  . Hyperlipidemia   . Hypertension   . Osteoporosis   . Seizures   . Barrett's esophagus 07/19/2004  . Anemia   . Obesity   . Pneumonia   . Stroke   . Sleep apnea   . Hepatitis   . Arthritis   . Blood transfusion   . COPD (chronic obstructive pulmonary disease)     wears 3 liters of o2 continious  . Emphysema of lung   . Ulcer   . Vaginal cancer   . Hiatal hernia     Past Surgical History  Procedure Laterality Date  . Orif hip fracture      right  . Cholecystectomy    . Hemorrhoid surgery    . Total hip arthroplasty      right  . Abdominal hysterectomy    . 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 Abdominal laproscopy surgery      Family History  Problem Relation Age of Onset  . Heart disease Mother     MI  . Diabetes Mother   . Uterine cancer Mother   . Anemia Mother   . Hypertension Mother   . Stomach cancer Father   . Esophageal cancer  Father   . Hypertension Father   . Diabetes Sister   . Anemia Sister   . Esophageal cancer Brother   . Hypertension Brother   . Diabetes Son   . Diabetes Son   . Brain cancer Brother   . Lung cancer Sister   . Liver cancer Sister   . Colon cancer Neg Hx   . Rectal cancer Neg Hx     History   Social History  . Marital Status: Divorced    Spouse Name: N/A    Number of Children: 32  . Years of Education: N/A   Occupational History  . disaled    Social History Main Topics  . Smoking status: Former Smoker -- 2.00 packs/day for 30 years    Types: Cigarettes    Quit date: 07/28/2002  . Smokeless tobacco: Never Used  . Alcohol Use: No  . Drug Use: No  . Sexual Activity: None   Other Topics Concern  . None   Social History Narrative    Current outpatient prescriptions: albuterol (PROVENTIL) (2.5 MG/3ML) 0.083% nebulizer solution, Take 3 mLs (2.5 mg total) by nebulization 4 (four) times daily., Disp: 360 mL, Rfl: 12;  CRESTOR 20 MG tablet, TAKE 1 TABLET BY MOUTH  EVERY OTHER DAY., Disp: 90 tablet, Rfl: 1;  escitalopram (LEXAPRO) 10 MG tablet, TAKE 1 TABLET BY MOUTH DAILY., Disp: 30 tablet, Rfl: 5 Ferrous Sulfate Dried 200 (65 FE) MG TABS, Take 200 mg by mouth daily., Disp: 30 tablet, Rfl: 6;  furosemide (LASIX) 40 MG tablet, TAKE 1 TABLET (40 MG TOTAL) BY MOUTH DAILY., Disp: 30 tablet, Rfl: 5;  ibandronate (BONIVA) 150 MG tablet, Take in the morning with a full glass of water, on an empty stomach,, Disp: 4 tablet, Rfl: 3 ipratropium (ATROVENT) 0.02 % nebulizer solution, Take 2.5 mLs (0.5 mg total) by nebulization 4 (four) times daily., Disp: 300 mL, Rfl: 12;  losartan (COZAAR) 25 MG tablet, TAKE 1 TABLET BY MOUTH DAILY., Disp: 90 tablet, Rfl: 1;  omeprazole (PRILOSEC) 20 MG capsule, TAKE 1 CAPSULE BY MOUTH ONCE DAILY., Disp: 30 capsule, Rfl: 6;  potassium chloride SA (K-DUR,KLOR-CON) 20 MEQ tablet, TAKE 1 TABLET BY MOUTH DAILY., Disp: 30 tablet, Rfl: 5 PROAIR HFA 108 (90 BASE) MCG/ACT  inhaler, INHALE 2 PUFFS INTO THE LUNGS EVERY 4 HOURS AS NEEDED FOR SHORTNESS OF BREATH, Disp: 8.5 g, Rfl: 5;  QVAR 80 MCG/ACT inhaler, INHALE 2 PUFFS INTO THE LUNGS 2 (TWO) TIMES DAILY., Disp: 8.7 g, Rfl: 6;  sodium chloride (AYR) 0.65 % nasal spray, Place 1 spray into the nose as needed for congestion., Disp: , Rfl: ;  traZODone (DESYREL) 100 MG tablet, TAKE 1 TABLET BY MOUTH AT BEDTIME., Disp: 90 tablet, Rfl: 1 ciprofloxacin (CIPRO) 250 MG tablet, Take 1 tablet (250 mg total) by mouth 2 (two) times daily., Disp: 10 tablet, Rfl: 0;  fluconazole (DIFLUCAN) 150 MG tablet, Take 1 tablet (150 mg total) by mouth once., Disp: 1 tablet, Rfl: 0  EXAM:  Filed Vitals:   06/26/14 1514  BP: 134/70  Pulse: 86  Temp: 98.2 F (36.8 C)    Body mass index is 0.00 kg/(m^2).  GENERAL: vitals reviewed and listed above, alert, oriented, appears well hydrated and in no acute distress  HEENT: atraumatic, conjunttiva clear, no obvious abnormalities on inspection of external nose and ears  NECK: no obvious masses on inspection  LUNGS: clear to auscultation bilaterally, no wheezes, rales or rhonchi, good air movement  GU: erythema and irritation of vulva and vaginal tissues, pelvic exam limited due to patient discomfort - on brief and limited speculum exam no vaginal bleeding or intravaginal lesions noted  ABD: no CVA TTP  CV: HRRR, no peripheral edema  MS: moves all extremities without noticeable abnormality  PSYCH: pleasant and cooperative, no obvious depression or anxiety  ASSESSMENT AND PLAN:  Discussed the following assessment and plan:  Yeast vaginitis - Plan: fluconazole (DIFLUCAN) 150 MG tablet  Dysuria - Plan: ciprofloxacin (CIPRO) 250 MG tablet  Vaginal cancer, Hx of - Plan: Ambulatory referral to Gynecology  Vaginal bleeding - Plan: Ambulatory referral to Gynecology  -udip with 3+ leuks and blood and dysuria c/w with possible recurrent or partially tx UTI, exam c/w likely yeast  vulvovaginitis -diflucan and longer course low dose cipro based on prior culture data -referral to gyn per pt and daughter request, ? Vaginal bleeding and hx vaginal cancer -checking urine and if infection or persisted urinary syptoms afer tx today and tx yeast consider urology referral  -Patient advised to return or notify a doctor immediately if symptoms worsen or persist or new concerns arise.  Patient Instructions  -take the antibiotic for 5 days and the yeast medication  -We placed a referral for you as discussed to  the gynecologist for the possible bleeding and the history of vaginal cancer. It usually takes about 1-2 weeks to process and schedule this referral. If you have not heard from [place] regarding this appointment in 2 weeks please contact our office.  -if you have persistent urinary symptoms or recurrent infections I would advise that you discus with your doctor, Dr. Burnice Logan and consider seeing a urologist      Lucretia Kern.

## 2014-06-28 ENCOUNTER — Telehealth: Payer: Self-pay | Admitting: Internal Medicine

## 2014-06-28 DIAGNOSIS — J449 Chronic obstructive pulmonary disease, unspecified: Secondary | ICD-10-CM

## 2014-06-28 LAB — URINE CULTURE

## 2014-06-28 NOTE — Telephone Encounter (Signed)
I spoke with the pt and she states she needs an order sent [facility] for nebulizer supplies. Order placed, pt aware. Esterbrook Bing, CMA

## 2014-06-30 ENCOUNTER — Encounter: Payer: Self-pay | Admitting: Obstetrics and Gynecology

## 2014-07-03 ENCOUNTER — Other Ambulatory Visit: Payer: Self-pay | Admitting: Internal Medicine

## 2014-07-17 ENCOUNTER — Telehealth: Payer: Self-pay

## 2014-07-17 MED ORDER — ESCITALOPRAM OXALATE 10 MG PO TABS: 10.0000 mg | ORAL_TABLET | Freq: Every day | ORAL | Status: DC

## 2014-07-17 MED ORDER — FLUTICASONE PROPIONATE 50 MCG/ACT NA SUSP: 2.0000 | Freq: Every day | NASAL | Status: DC

## 2014-07-17 NOTE — Telephone Encounter (Signed)
Rx request for:   Escitalopram 10 mg tablet- Take 1 tablet by mouth daily #30  Fluticasone prop 50 mcg spray- Place 2 sprays into th enose daily as needed for nasal drainage  Pls advise.

## 2014-07-17 NOTE — Telephone Encounter (Signed)
Rxs sent

## 2014-07-25 ENCOUNTER — Telehealth: Payer: Self-pay | Admitting: Internal Medicine

## 2014-07-25 MED ORDER — PREDNISONE 10 MG PO TABS: ORAL_TABLET | ORAL | Status: DC

## 2014-07-25 NOTE — Telephone Encounter (Signed)
Called and spoke with pt and she stated that she has been having a lot of congestion and cough for the last 2-3 days.  The congestion is very thick and clear sputum.  Pt denies any fever, chills or sweats.  Pt is requesting to have prednisone called to her pharmacy.  MR is out of the office today.  MW please advise. Thanks  No Known Allergies  Current Outpatient Prescriptions on File Prior to Visit  Medication Sig Dispense Refill  . albuterol (PROVENTIL) (2.5 MG/3ML) 0.083% nebulizer solution Take 3 mLs (2.5 mg total) by nebulization 4 (four) times daily. 360 mL 12  . ciprofloxacin (CIPRO) 250 MG tablet Take 1 tablet (250 mg total) by mouth 2 (two) times daily. 10 tablet 0  . CRESTOR 20 MG tablet TAKE 1 TABLET BY MOUTH EVERY OTHER DAY. 90 tablet 1  . escitalopram (LEXAPRO) 10 MG tablet Take 1 tablet (10 mg total) by mouth daily. 30 tablet 5  . Ferrous Sulfate Dried 200 (65 FE) MG TABS Take 200 mg by mouth daily. 30 tablet 6  . fluconazole (DIFLUCAN) 150 MG tablet Take 1 tablet (150 mg total) by mouth once. 1 tablet 0  . fluticasone (FLONASE) 50 MCG/ACT nasal spray Place 2 sprays into both nostrils daily. 16 g 5  . furosemide (LASIX) 40 MG tablet TAKE 1 TABLET (40 MG TOTAL) BY MOUTH DAILY. 30 tablet 5  . ibandronate (BONIVA) 150 MG tablet Take in the morning with a full glass of water, on an empty stomach, 4 tablet 3  . ipratropium (ATROVENT) 0.02 % nebulizer solution Take 2.5 mLs (0.5 mg total) by nebulization 4 (four) times daily. 300 mL 12  . losartan (COZAAR) 25 MG tablet TAKE 1 TABLET BY MOUTH DAILY. 90 tablet 1  . omeprazole (PRILOSEC) 20 MG capsule TAKE 1 CAPSULE BY MOUTH ONCE DAILY. 30 capsule 6  . potassium chloride SA (K-DUR,KLOR-CON) 20 MEQ tablet TAKE 1 TABLET BY MOUTH DAILY. 30 tablet 5  . PROAIR HFA 108 (90 BASE) MCG/ACT inhaler INHALE 2 PUFFS INTO THE LUNGS EVERY 4 HOURS AS NEEDED FOR SHORTNESS OF BREATH 8.5 g 5  . QVAR 80 MCG/ACT inhaler INHALE 2 PUFFS INTO THE LUNGS 2 (TWO) TIMES  DAILY. 8.7 g 6  . sodium chloride (AYR) 0.65 % nasal spray Place 1 spray into the nose as needed for congestion.    . traZODone (DESYREL) 100 MG tablet TAKE 1 TABLET BY MOUTH AT BEDTIME. 90 tablet 1   No current facility-administered medications on file prior to visit.

## 2014-07-25 NOTE — Telephone Encounter (Signed)
Called spoke with patient and advised that MW Laurie Panda for a pred taper but she needs to call the office early next week if her symptoms are no better so that she can be seen.  Pt okay with these recommendations and voiced her understanding.  Rx sent to verified pharmacy. Nothing further needed; will sign off.

## 2014-07-25 NOTE — Telephone Encounter (Signed)
Prednisone 10 mg take  4 each am x 2 days,   2 each am x 2 days,  1 each am x 2 days and stop -  Needs ov first of next week if not better

## 2014-07-29 DIAGNOSIS — I10 Essential (primary) hypertension: Secondary | ICD-10-CM | POA: Diagnosis not present

## 2014-07-30 DIAGNOSIS — I10 Essential (primary) hypertension: Secondary | ICD-10-CM | POA: Diagnosis not present

## 2014-07-31 DIAGNOSIS — I10 Essential (primary) hypertension: Secondary | ICD-10-CM | POA: Diagnosis not present

## 2014-08-01 DIAGNOSIS — I10 Essential (primary) hypertension: Secondary | ICD-10-CM | POA: Diagnosis not present

## 2014-08-02 DIAGNOSIS — I10 Essential (primary) hypertension: Secondary | ICD-10-CM | POA: Diagnosis not present

## 2014-08-03 DIAGNOSIS — I10 Essential (primary) hypertension: Secondary | ICD-10-CM | POA: Diagnosis not present

## 2014-08-04 DIAGNOSIS — I10 Essential (primary) hypertension: Secondary | ICD-10-CM | POA: Diagnosis not present

## 2014-08-05 DIAGNOSIS — I10 Essential (primary) hypertension: Secondary | ICD-10-CM | POA: Diagnosis not present

## 2014-08-06 DIAGNOSIS — I10 Essential (primary) hypertension: Secondary | ICD-10-CM | POA: Diagnosis not present

## 2014-08-07 ENCOUNTER — Encounter: Payer: Self-pay | Admitting: Internal Medicine

## 2014-08-07 ENCOUNTER — Ambulatory Visit (INDEPENDENT_AMBULATORY_CARE_PROVIDER_SITE_OTHER): Payer: Medicare HMO | Admitting: Internal Medicine

## 2014-08-07 DIAGNOSIS — K222 Esophageal obstruction: Secondary | ICD-10-CM | POA: Diagnosis not present

## 2014-08-07 DIAGNOSIS — J449 Chronic obstructive pulmonary disease, unspecified: Secondary | ICD-10-CM | POA: Diagnosis not present

## 2014-08-07 DIAGNOSIS — Z23 Encounter for immunization: Secondary | ICD-10-CM | POA: Diagnosis not present

## 2014-08-07 DIAGNOSIS — D509 Iron deficiency anemia, unspecified: Secondary | ICD-10-CM

## 2014-08-07 DIAGNOSIS — I10 Essential (primary) hypertension: Secondary | ICD-10-CM

## 2014-08-07 LAB — COMPREHENSIVE METABOLIC PANEL
ALBUMIN: 3.5 g/dL (ref 3.5–5.2)
ALT: 5 U/L (ref 0–35)
AST: 17 U/L (ref 0–37)
Alkaline Phosphatase: 62 U/L (ref 39–117)
BUN: 14 mg/dL (ref 6–23)
CO2: 37 meq/L — AB (ref 19–32)
Calcium: 9.4 mg/dL (ref 8.4–10.5)
Chloride: 98 mEq/L (ref 96–112)
Creatinine, Ser: 0.8 mg/dL (ref 0.4–1.2)
GFR: 77.31 mL/min (ref >60.00)
GLUCOSE: 121 mg/dL — AB (ref 70–99)
Potassium: 3.9 mEq/L (ref 3.5–5.1)
Sodium: 142 mEq/L (ref 135–145)
Total Bilirubin: 0.1 mg/dL — ABNORMAL LOW (ref 0.2–1.2)
Total Protein: 7.3 g/dL (ref 6.0–8.3)

## 2014-08-07 LAB — CBC WITH DIFFERENTIAL/PLATELET
BASOS PCT: 0.3 % (ref 0.0–3.0)
Basophils Absolute: 0 10*3/uL (ref 0.0–0.1)
Eosinophils Absolute: 0.3 10*3/uL (ref 0.0–0.7)
Eosinophils Relative: 2.4 % (ref 0.0–5.0)
Hemoglobin: 9.5 g/dL — ABNORMAL LOW (ref 12.0–15.0)
Lymphocytes Relative: 30.4 % (ref 12.0–46.0)
Lymphs Abs: 3.6 10*3/uL (ref 0.7–4.0)
MCHC: 29.6 g/dL — ABNORMAL LOW (ref 30.0–36.0)
MCV: 79.1 fl (ref 78.0–100.0)
Monocytes Absolute: 1.1 10*3/uL — ABNORMAL HIGH (ref 0.1–1.0)
Monocytes Relative: 9.6 % (ref 3.0–12.0)
NEUTROS ABS: 6.7 10*3/uL (ref 1.4–7.7)
Neutrophils Relative %: 57.3 % (ref 43.0–77.0)
Platelets: 356 10*3/uL (ref 150.0–400.0)
RBC: 4.07 Mil/uL (ref 3.87–5.11)
RDW: 20.6 % — ABNORMAL HIGH (ref 11.5–15.5)
WBC: 11.8 10*3/uL — ABNORMAL HIGH (ref 4.0–10.5)

## 2014-08-07 NOTE — Progress Notes (Signed)
Pre visit review using our clinic review tool, if applicable. No additional management support is needed unless otherwise documented below in the visit note. 

## 2014-08-07 NOTE — Progress Notes (Signed)
Subjective:    Patient ID: Pamela Hampton, female    DOB: 1940-08-03, 74 y.o.   MRN: [id number]  HPI  74 year old patient who has a history of advanced COPD and chronic respiratory failure.  She has done quite well over the past 6 months.  She is scheduled for pulmonary follow-up next month.  She has a history of essential hypertension as well as dyslipidemia.  She has a history of AVMs and iron deficiency anemia.  She feels quite well today.  Her pulmonary status has been stable.  Past Medical History  Diagnosis Date  . Allergy     Rhinitis  . Depression   . GERD (gastroesophageal reflux disease)     Barrett's esophagus  . Hyperlipidemia   . Hypertension   . Osteoporosis   . Seizures   . Barrett's esophagus 07/19/2004  . Anemia   . Obesity   . Pneumonia   . Stroke   . Sleep apnea   . Hepatitis   . Arthritis   . Blood transfusion   . COPD (chronic obstructive pulmonary disease)     wears 3 liters of o2 continious  . Emphysema of lung   . Ulcer   . Vaginal cancer   . Hiatal hernia     History   Social History  . Marital Status: Divorced    Spouse Name: N/A    Number of Children: 44  . Years of Education: N/A   Occupational History  . disaled    Social History Main Topics  . Smoking status: Former Smoker -- 2.00 packs/day for 30 years    Types: Cigarettes    Quit date: 07/28/2002  . Smokeless tobacco: Never Used  . Alcohol Use: No  . Drug Use: No  . Sexual Activity: Not on file   Other Topics Concern  . Not on file   Social History Narrative    Past Surgical History  Procedure Laterality Date  . Orif hip fracture      right  . Cholecystectomy    . Hemorrhoid surgery    . Total hip arthroplasty      right  . Abdominal hysterectomy    . 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 Abdominal laproscopy surgery      Family History  Problem Relation Age of Onset  . Heart disease Mother    MI  . Diabetes Mother   . Uterine cancer Mother   . Anemia Mother   . Hypertension Mother   . Stomach cancer Father   . Esophageal cancer Father   . Hypertension Father   . Diabetes Sister   . Anemia Sister   . Esophageal cancer Brother   . Hypertension Brother   . Diabetes Son   . Diabetes Son   . Brain cancer Brother   . Lung cancer Sister   . Liver cancer Sister   . Colon cancer Neg Hx   . Rectal cancer Neg Hx     No Known Allergies  Current Outpatient Prescriptions on File Prior to Visit  Medication Sig Dispense Refill  . albuterol (PROVENTIL) (2.5 MG/3ML) 0.083% nebulizer solution Take 3 mLs (2.5 mg total) by nebulization 4 (four) times daily. 360 mL 12  . CRESTOR 20 MG tablet TAKE 1 TABLET BY MOUTH EVERY OTHER DAY. 90 tablet 1  . escitalopram (LEXAPRO) 10 MG tablet Take 1 tablet (10 mg total) by mouth daily. 30 tablet 5  .  Ferrous Sulfate Dried 200 (65 FE) MG TABS Take 200 mg by mouth daily. 30 tablet 6  . fluconazole (DIFLUCAN) 150 MG tablet Take 1 tablet (150 mg total) by mouth once. 1 tablet 0  . fluticasone (FLONASE) 50 MCG/ACT nasal spray Place 2 sprays into both nostrils daily. 16 g 5  . furosemide (LASIX) 40 MG tablet TAKE 1 TABLET (40 MG TOTAL) BY MOUTH DAILY. 30 tablet 5  . ibandronate (BONIVA) 150 MG tablet Take in the morning with a full glass of water, on an empty stomach, 4 tablet 3  . ipratropium (ATROVENT) 0.02 % nebulizer solution Take 2.5 mLs (0.5 mg total) by nebulization 4 (four) times daily. 300 mL 12  . losartan (COZAAR) 25 MG tablet TAKE 1 TABLET BY MOUTH DAILY. 90 tablet 1  . omeprazole (PRILOSEC) 20 MG capsule TAKE 1 CAPSULE BY MOUTH ONCE DAILY. 30 capsule 6  . potassium chloride SA (K-DUR,KLOR-CON) 20 MEQ tablet TAKE 1 TABLET BY MOUTH DAILY. 30 tablet 5  . PROAIR HFA 108 (90 BASE) MCG/ACT inhaler INHALE 2 PUFFS INTO THE LUNGS EVERY 4 HOURS AS NEEDED FOR SHORTNESS OF BREATH 8.5 g 5  . QVAR 80 MCG/ACT inhaler INHALE 2 PUFFS INTO THE LUNGS 2 (TWO)  TIMES DAILY. 8.7 g 6  . sodium chloride (AYR) 0.65 % nasal spray Place 1 spray into the nose as needed for congestion.    . traZODone (DESYREL) 100 MG tablet TAKE 1 TABLET BY MOUTH AT BEDTIME. 90 tablet 1   No current facility-administered medications on file prior to visit.    BP 140/80 mmHg  Pulse 81  Temp(Src) 97.6 F (36.4 C) (Oral)  Resp 20  SpO2 99%     Review of Systems  Constitutional: Negative.   HENT: Negative for congestion, dental problem, hearing loss, rhinorrhea, sinus pressure, sore throat and tinnitus.   Eyes: Negative for pain, discharge and visual disturbance.  Respiratory: Positive for shortness of breath. Negative for cough.   Cardiovascular: Negative for chest pain, palpitations and leg swelling.  Gastrointestinal: Negative for nausea, vomiting, abdominal pain, diarrhea, constipation, blood in stool and abdominal distention.  Genitourinary: Negative for dysuria, urgency, frequency, hematuria, flank pain, vaginal bleeding, vaginal discharge, difficulty urinating, vaginal pain and pelvic pain.  Musculoskeletal: Positive for back pain and gait problem (Wheelchair bound). Negative for joint swelling and arthralgias.  Skin: Negative for rash.  Neurological: Negative for dizziness, syncope, speech difficulty, weakness, numbness and headaches.  Hematological: Negative for adenopathy.  Psychiatric/Behavioral: Negative for behavioral problems, dysphoric mood and agitation. The patient is not nervous/anxious.        Objective:   Physical Exam  Constitutional: She is oriented to person, place, and time. She appears well-developed and well-nourished. No distress.  No distress Blood pressure 130/80 Sitting in wheelchair O2 saturation 99% on 4 L of oxygen per minute Obese  HENT:  Head: Normocephalic.  Right Ear: External ear normal.  Left Ear: External ear normal.  Mouth/Throat: Oropharynx is clear and moist.  Eyes: Conjunctivae and EOM are normal. Pupils are  equal, round, and reactive to light.  Neck: Normal range of motion. Neck supple. No thyromegaly present.  Cardiovascular: Normal rate, regular rhythm, normal heart sounds and intact distal pulses.   Pulmonary/Chest: Effort normal. No respiratory distress.  Generally diminished breath sounds with some faint scattered rhonchi  Abdominal: Soft. Bowel sounds are normal. She exhibits no mass. There is no tenderness.  Musculoskeletal: Normal range of motion.  Lymphadenopathy:    She has no cervical adenopathy.  Neurological: She is alert and oriented to person, place, and time.  Skin: Skin is warm and dry. No rash noted.  Psychiatric: She has a normal mood and affect. Her behavior is normal.          Assessment & Plan:   Severe COPD, chronic respiratory failure Hypertension, well-controlled History depression, stable History of iron deficiency anemia secondary to chronic AVMs.  Will check a CBC  Pulmonary follow-up next month as scheduled

## 2014-08-07 NOTE — Patient Instructions (Signed)
Limit your sodium (Salt) intake  Take an iron supplement twice daily  Pulmonary follow-up as scheduled

## 2014-08-08 DIAGNOSIS — I10 Essential (primary) hypertension: Secondary | ICD-10-CM | POA: Diagnosis not present

## 2014-08-09 DIAGNOSIS — I10 Essential (primary) hypertension: Secondary | ICD-10-CM | POA: Diagnosis not present

## 2014-08-10 ENCOUNTER — Ambulatory Visit (INDEPENDENT_AMBULATORY_CARE_PROVIDER_SITE_OTHER): Payer: Medicare HMO | Admitting: Obstetrics and Gynecology

## 2014-08-10 ENCOUNTER — Encounter: Payer: Self-pay | Admitting: Obstetrics and Gynecology

## 2014-08-10 VITALS — BP 122/62 | HR 91 | Temp 99.1°F | Wt 198.9 lb

## 2014-08-10 DIAGNOSIS — I10 Essential (primary) hypertension: Secondary | ICD-10-CM | POA: Diagnosis not present

## 2014-08-10 DIAGNOSIS — B373 Candidiasis of vulva and vagina: Secondary | ICD-10-CM | POA: Diagnosis present

## 2014-08-10 DIAGNOSIS — B3731 Acute candidiasis of vulva and vagina: Secondary | ICD-10-CM

## 2014-08-10 MED ORDER — ESTROGENS, CONJUGATED 0.625 MG/GM VA CREA: 1.0000 | TOPICAL_CREAM | Freq: Every day | VAGINAL | Status: DC

## 2014-08-10 MED ORDER — CLOTRIMAZOLE-BETAMETHASONE 1-0.05 % EX CREA: 1.0000 "application " | TOPICAL_CREAM | Freq: Two times a day (BID) | CUTANEOUS | Status: DC

## 2014-08-10 NOTE — Progress Notes (Signed)
Subjective:    Patient ID: Pamela Hampton, female    DOB: [birth year], 74 y.o.   MRN: [id number]  HPI 74 yo G4P4 s/p surgical menopause several years ago presenting today for the evaluation of ? vaginal bleeding. Patient was seen in November by her PCP for this issue and was treated for a yeast infection and UTI. Patient reports improvement in her bleeding but states that her vulva feels very raw and painful. Patient reports that she suffers from frequent UTI's and often leaks urine. Her health aid uses baking soda on the pad that she sits on and the patient feels that her pain has gotten worst ever since. Patient states that her vaginal bleeding is never enough to wear a pad but enough to notice when she wipes. Wiping is very painful. Patient has a ? History of vaginal cancer 15 years ago.  Past Medical History  Diagnosis Date  . Allergy     Rhinitis  . Depression   . GERD (gastroesophageal reflux disease)     Barrett's esophagus  . Hyperlipidemia   . Hypertension   . Osteoporosis   . Seizures   . Barrett's esophagus 07/19/2004  . Anemia   . Obesity   . Pneumonia   . Stroke   . Sleep apnea   . Hepatitis   . Arthritis   . Blood transfusion   . COPD (chronic obstructive pulmonary disease)     wears 3 liters of o2 continious  . Emphysema of lung   . Ulcer   . Vaginal cancer   . Hiatal hernia    Past Surgical History  Procedure Laterality Date  . Orif hip fracture      right  . Cholecystectomy    . Hemorrhoid surgery    . Total hip arthroplasty      right  . Abdominal hysterectomy    . 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 Abdominal laproscopy surgery     Family History  Problem Relation Age of Onset  . Heart disease Mother     MI  . Diabetes Mother   . Uterine cancer Mother   . Anemia Mother   . Hypertension Mother   . Stomach cancer Father   . Esophageal cancer Father   . Hypertension Father   .  Diabetes Sister   . Anemia Sister   . Esophageal cancer Brother   . Hypertension Brother   . Diabetes Son   . Diabetes Son   . Brain cancer Brother   . Lung cancer Sister   . Liver cancer Sister   . Colon cancer Neg Hx   . Rectal cancer Neg Hx    History  Substance Use Topics  . Smoking status: Former Smoker -- 2.00 packs/day for 30 years    Types: Cigarettes    Quit date: 07/28/2002  . Smokeless tobacco: Never Used  . Alcohol Use: No      Review of Systems     Objective:   Physical Exam GENERAL: Well-developed, well-nourished female in no acute distress.  ABDOMEN: Soft, nontender, nondistended. No organomegaly. PELVIC: Normal external female genitalia with extensive erythema involving mons pubis, bilateral labia and buttocks. Vagina is pale and atrophic. No active bleeding. No adnexal mass or tenderness. EXTREMITIES: No cyanosis, clubbing, or edema, 2+ distal pulses.     Assessment & Plan:  74 yo G4P4 with extensive vulva irritation and vaginal atrophy - Rx Mycolog cream and  vaginal estrace provided. Patient was instructed to slowly decrease her usage of the estrace cream until comfort level - Patient was also advised to discontinue the use of baking soda and to protect her vulva with ointment cream similar to Desitin or A&D ointment - Patient plans to follow up with urologist for frequent UTI - Discussed may need evaluation of urinary incontinence with Dr. Hulan Fray if leakage continues - RTC in 3 months for follow up

## 2014-08-11 DIAGNOSIS — I10 Essential (primary) hypertension: Secondary | ICD-10-CM | POA: Diagnosis not present

## 2014-08-11 DIAGNOSIS — J449 Chronic obstructive pulmonary disease, unspecified: Secondary | ICD-10-CM | POA: Diagnosis not present

## 2014-08-12 DIAGNOSIS — I10 Essential (primary) hypertension: Secondary | ICD-10-CM | POA: Diagnosis not present

## 2014-08-13 DIAGNOSIS — I10 Essential (primary) hypertension: Secondary | ICD-10-CM | POA: Diagnosis not present

## 2014-08-14 DIAGNOSIS — I10 Essential (primary) hypertension: Secondary | ICD-10-CM | POA: Diagnosis not present

## 2014-08-15 DIAGNOSIS — I10 Essential (primary) hypertension: Secondary | ICD-10-CM | POA: Diagnosis not present

## 2014-08-16 DIAGNOSIS — I10 Essential (primary) hypertension: Secondary | ICD-10-CM | POA: Diagnosis not present

## 2014-08-16 DIAGNOSIS — J449 Chronic obstructive pulmonary disease, unspecified: Secondary | ICD-10-CM | POA: Diagnosis not present

## 2014-08-17 DIAGNOSIS — I10 Essential (primary) hypertension: Secondary | ICD-10-CM | POA: Diagnosis not present

## 2014-08-18 DIAGNOSIS — J449 Chronic obstructive pulmonary disease, unspecified: Secondary | ICD-10-CM | POA: Diagnosis not present

## 2014-08-19 DIAGNOSIS — I10 Essential (primary) hypertension: Secondary | ICD-10-CM | POA: Diagnosis not present

## 2014-08-20 DIAGNOSIS — I10 Essential (primary) hypertension: Secondary | ICD-10-CM | POA: Diagnosis not present

## 2014-08-21 DIAGNOSIS — I10 Essential (primary) hypertension: Secondary | ICD-10-CM | POA: Diagnosis not present

## 2014-08-22 DIAGNOSIS — I10 Essential (primary) hypertension: Secondary | ICD-10-CM | POA: Diagnosis not present

## 2014-08-23 DIAGNOSIS — I10 Essential (primary) hypertension: Secondary | ICD-10-CM | POA: Diagnosis not present

## 2014-08-24 DIAGNOSIS — I10 Essential (primary) hypertension: Secondary | ICD-10-CM | POA: Diagnosis not present

## 2014-08-25 DIAGNOSIS — I10 Essential (primary) hypertension: Secondary | ICD-10-CM | POA: Diagnosis not present

## 2014-08-26 DIAGNOSIS — I10 Essential (primary) hypertension: Secondary | ICD-10-CM | POA: Diagnosis not present

## 2014-08-27 DIAGNOSIS — I10 Essential (primary) hypertension: Secondary | ICD-10-CM | POA: Diagnosis not present

## 2014-08-28 DIAGNOSIS — I10 Essential (primary) hypertension: Secondary | ICD-10-CM | POA: Diagnosis not present

## 2014-08-29 DIAGNOSIS — I10 Essential (primary) hypertension: Secondary | ICD-10-CM | POA: Diagnosis not present

## 2014-08-30 ENCOUNTER — Encounter: Payer: Self-pay | Admitting: Internal Medicine

## 2014-08-30 ENCOUNTER — Ambulatory Visit (INDEPENDENT_AMBULATORY_CARE_PROVIDER_SITE_OTHER): Payer: Medicare HMO | Admitting: Internal Medicine

## 2014-08-30 VITALS — BP 146/70 | HR 88 | Temp 98.1°F | Resp 20

## 2014-08-30 DIAGNOSIS — J9611 Chronic respiratory failure with hypoxia: Secondary | ICD-10-CM

## 2014-08-30 DIAGNOSIS — I10 Essential (primary) hypertension: Secondary | ICD-10-CM

## 2014-08-30 DIAGNOSIS — K5900 Constipation, unspecified: Secondary | ICD-10-CM

## 2014-08-30 MED ORDER — POLYETHYLENE GLYCOL 3350 17 GM/SCOOP PO POWD: 17.0000 g | Freq: Two times a day (BID) | ORAL | Status: AC | PRN

## 2014-08-30 NOTE — Progress Notes (Signed)
Subjective:    Patient ID: Pamela Hampton, female    DOB: 1940/08/13, 74 y.o.   MRN: [id number]  HPI  74 year old patient who is seen today in follow-up with a chief complaint of constipation over the past week.  She does have a history of advanced COPD, oxygen dependent and is quite sedentary. She has had some urgency, lower abdominal discomfort and at times small amounts of blood on the tissue paper  Past Medical History  Diagnosis Date  . Allergy     Rhinitis  . Depression   . GERD (gastroesophageal reflux disease)     Barrett's esophagus  . Hyperlipidemia   . Hypertension   . Osteoporosis   . Seizures   . Barrett's esophagus 07/19/2004  . Anemia   . Obesity   . Pneumonia   . Stroke   . Sleep apnea   . Hepatitis   . Arthritis   . Blood transfusion   . COPD (chronic obstructive pulmonary disease)     wears 3 liters of o2 continious  . Emphysema of lung   . Ulcer   . Vaginal cancer   . Hiatal hernia     History   Social History  . Marital Status: Divorced    Spouse Name: N/A    Number of Children: 45  . Years of Education: N/A   Occupational History  . disaled    Social History Main Topics  . Smoking status: Former Smoker -- 2.00 packs/day for 30 years    Types: Cigarettes    Quit date: 07/28/2002  . Smokeless tobacco: Never Used  . Alcohol Use: No  . Drug Use: No  . Sexual Activity: Not Currently   Other Topics Concern  . Not on file   Social History Narrative    Past Surgical History  Procedure Laterality Date  . Orif hip fracture      right  . Cholecystectomy    . Hemorrhoid surgery    . Total hip arthroplasty      right  . Abdominal hysterectomy    . 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 Abdominal laproscopy surgery      Family History  Problem Relation Age of Onset  . Heart disease Mother     MI  . Diabetes Mother   . Uterine cancer Mother   . Anemia Mother   .  Hypertension Mother   . Stomach cancer Father   . Esophageal cancer Father   . Hypertension Father   . Diabetes Sister   . Anemia Sister   . Esophageal cancer Brother   . Hypertension Brother   . Diabetes Son   . Diabetes Son   . Brain cancer Brother   . Lung cancer Sister   . Liver cancer Sister   . Colon cancer Neg Hx   . Rectal cancer Neg Hx     No Known Allergies  Current Outpatient Prescriptions on File Prior to Visit  Medication Sig Dispense Refill  . albuterol (PROVENTIL) (2.5 MG/3ML) 0.083% nebulizer solution Take 3 mLs (2.5 mg total) by nebulization 4 (four) times daily. 360 mL 12  . clotrimazole-betamethasone (LOTRISONE) cream Apply 1 application topically 2 (two) times daily. 30 g 6  . conjugated estrogens (PREMARIN) vaginal cream Place 1 Applicatorful vaginally daily. 42.5 g 12  . CRESTOR 20 MG tablet TAKE 1 TABLET BY MOUTH EVERY OTHER DAY. 90 tablet 1  . escitalopram (LEXAPRO) 10 MG  tablet Take 1 tablet (10 mg total) by mouth daily. 30 tablet 5  . Ferrous Sulfate Dried 200 (65 FE) MG TABS Take 200 mg by mouth daily. 30 tablet 6  . fluconazole (DIFLUCAN) 150 MG tablet Take 1 tablet (150 mg total) by mouth once. 1 tablet 0  . fluticasone (FLONASE) 50 MCG/ACT nasal spray Place 2 sprays into both nostrils daily. 16 g 5  . furosemide (LASIX) 40 MG tablet TAKE 1 TABLET (40 MG TOTAL) BY MOUTH DAILY. 30 tablet 5  . ibandronate (BONIVA) 150 MG tablet Take in the morning with a full glass of water, on an empty stomach, 4 tablet 3  . ipratropium (ATROVENT) 0.02 % nebulizer solution Take 2.5 mLs (0.5 mg total) by nebulization 4 (four) times daily. 300 mL 12  . losartan (COZAAR) 25 MG tablet TAKE 1 TABLET BY MOUTH DAILY. 90 tablet 1  . omeprazole (PRILOSEC) 20 MG capsule TAKE 1 CAPSULE BY MOUTH ONCE DAILY. 30 capsule 6  . potassium chloride SA (K-DUR,KLOR-CON) 20 MEQ tablet TAKE 1 TABLET BY MOUTH DAILY. 30 tablet 5  . PROAIR HFA 108 (90 BASE) MCG/ACT inhaler INHALE 2 PUFFS INTO THE  LUNGS EVERY 4 HOURS AS NEEDED FOR SHORTNESS OF BREATH 8.5 g 5  . QVAR 80 MCG/ACT inhaler INHALE 2 PUFFS INTO THE LUNGS 2 (TWO) TIMES DAILY. 8.7 g 6  . sodium chloride (AYR) 0.65 % nasal spray Place 1 spray into the nose as needed for congestion.    . traZODone (DESYREL) 100 MG tablet TAKE 1 TABLET BY MOUTH AT BEDTIME. 90 tablet 1   No current facility-administered medications on file prior to visit.    BP 146/70 mmHg  Pulse 88  Temp(Src) 98.1 F (36.7 C) (Oral)  Resp 20  SpO2 96%     Review of Systems  Constitutional: Negative.   HENT: Negative for congestion, dental problem, hearing loss, rhinorrhea, sinus pressure, sore throat and tinnitus.   Eyes: Negative for pain, discharge and visual disturbance.  Respiratory: Positive for shortness of breath. Negative for cough.   Cardiovascular: Negative for chest pain, palpitations and leg swelling.  Gastrointestinal: Positive for abdominal pain, constipation, blood in stool and anal bleeding. Negative for nausea, vomiting, diarrhea and abdominal distention.  Genitourinary: Negative for dysuria, urgency, frequency, hematuria, flank pain, vaginal bleeding, vaginal discharge, difficulty urinating, vaginal pain and pelvic pain.  Musculoskeletal: Positive for gait problem. Negative for joint swelling and arthralgias.  Skin: Negative for rash.  Neurological: Negative for dizziness, syncope, speech difficulty, weakness, numbness and headaches.  Hematological: Negative for adenopathy.  Psychiatric/Behavioral: Negative for behavioral problems, dysphoric mood and agitation. The patient is not nervous/anxious.        Objective:   Physical Exam  Constitutional: She is oriented to person, place, and time. She appears well-developed and well-nourished. No distress.  HENT:  Head: Normocephalic.  Right Ear: External ear normal.  Left Ear: External ear normal.  Mouth/Throat: Oropharynx is clear and moist.  Nasal cannula O2 in place  Eyes:  Conjunctivae and EOM are normal. Pupils are equal, round, and reactive to light.  Neck: Normal range of motion. Neck supple. No thyromegaly present.  Cardiovascular: Normal rate, regular rhythm, normal heart sounds and intact distal pulses.   No tachycardia  Pulmonary/Chest: Effort normal.  O2 saturation 96% on 4 L/m  Diminished breath sounds but no wheezing  Abdominal: Soft. Bowel sounds are normal. She exhibits no distension and no mass. There is tenderness. There is no rebound and no guarding.  Midline  surgical scar, almost from the xiphoid process to the symphysis pubis Mild lower abdominal tenderness Abdomen appeared to be slightly full but nondistended Bowel sounds were normal  Genitourinary: Guaiac negative stool.  Rectal examination revealed only small amount of soft stool in the rectal vault Stool was light brown and heme-negative  Musculoskeletal: Normal range of motion.  Lymphadenopathy:    She has no cervical adenopathy.  Neurological: She is alert and oriented to person, place, and time.  Skin: Skin is warm and dry. No rash noted.  Psychiatric: She has a normal mood and affect. Her behavior is normal.          Assessment & Plan:   Constipation.  Issue addressed.  Will place on a stool softener.  Liberalize fluid intake and use MiraLAX. Advanced COPD

## 2014-08-30 NOTE — Progress Notes (Signed)
Pre visit review using our clinic review tool, if applicable. No additional management support is needed unless otherwise documented below in the visit note. 

## 2014-08-30 NOTE — Patient Instructions (Signed)

## 2014-08-31 DIAGNOSIS — I10 Essential (primary) hypertension: Secondary | ICD-10-CM | POA: Diagnosis not present

## 2014-09-01 DIAGNOSIS — I10 Essential (primary) hypertension: Secondary | ICD-10-CM | POA: Diagnosis not present

## 2014-09-02 DIAGNOSIS — I10 Essential (primary) hypertension: Secondary | ICD-10-CM | POA: Diagnosis not present

## 2014-09-03 DIAGNOSIS — I10 Essential (primary) hypertension: Secondary | ICD-10-CM | POA: Diagnosis not present

## 2014-09-04 DIAGNOSIS — I10 Essential (primary) hypertension: Secondary | ICD-10-CM | POA: Diagnosis not present

## 2014-09-05 DIAGNOSIS — I10 Essential (primary) hypertension: Secondary | ICD-10-CM | POA: Diagnosis not present

## 2014-09-06 DIAGNOSIS — H3531 Nonexudative age-related macular degeneration: Secondary | ICD-10-CM | POA: Diagnosis not present

## 2014-09-06 DIAGNOSIS — H3532 Exudative age-related macular degeneration: Secondary | ICD-10-CM | POA: Diagnosis not present

## 2014-09-06 DIAGNOSIS — I10 Essential (primary) hypertension: Secondary | ICD-10-CM | POA: Diagnosis not present

## 2014-09-07 DIAGNOSIS — I10 Essential (primary) hypertension: Secondary | ICD-10-CM | POA: Diagnosis not present

## 2014-09-08 DIAGNOSIS — I10 Essential (primary) hypertension: Secondary | ICD-10-CM | POA: Diagnosis not present

## 2014-09-09 DIAGNOSIS — I10 Essential (primary) hypertension: Secondary | ICD-10-CM | POA: Diagnosis not present

## 2014-09-10 DIAGNOSIS — I10 Essential (primary) hypertension: Secondary | ICD-10-CM | POA: Diagnosis not present

## 2014-09-11 ENCOUNTER — Other Ambulatory Visit: Payer: Self-pay | Admitting: Internal Medicine

## 2014-09-11 DIAGNOSIS — J449 Chronic obstructive pulmonary disease, unspecified: Secondary | ICD-10-CM | POA: Diagnosis not present

## 2014-09-12 ENCOUNTER — Other Ambulatory Visit: Payer: Self-pay | Admitting: Internal Medicine

## 2014-09-12 DIAGNOSIS — I10 Essential (primary) hypertension: Secondary | ICD-10-CM | POA: Diagnosis not present

## 2014-09-13 DIAGNOSIS — I10 Essential (primary) hypertension: Secondary | ICD-10-CM | POA: Diagnosis not present

## 2014-09-14 DIAGNOSIS — I10 Essential (primary) hypertension: Secondary | ICD-10-CM | POA: Diagnosis not present

## 2014-09-15 DIAGNOSIS — I10 Essential (primary) hypertension: Secondary | ICD-10-CM | POA: Diagnosis not present

## 2014-09-16 DIAGNOSIS — I10 Essential (primary) hypertension: Secondary | ICD-10-CM | POA: Diagnosis not present

## 2014-09-16 DIAGNOSIS — J449 Chronic obstructive pulmonary disease, unspecified: Secondary | ICD-10-CM | POA: Diagnosis not present

## 2014-09-17 DIAGNOSIS — I10 Essential (primary) hypertension: Secondary | ICD-10-CM | POA: Diagnosis not present

## 2014-09-18 ENCOUNTER — Ambulatory Visit: Payer: Commercial Managed Care - HMO | Admitting: Internal Medicine

## 2014-09-18 DIAGNOSIS — J449 Chronic obstructive pulmonary disease, unspecified: Secondary | ICD-10-CM | POA: Diagnosis not present

## 2014-09-18 DIAGNOSIS — I10 Essential (primary) hypertension: Secondary | ICD-10-CM | POA: Diagnosis not present

## 2014-09-19 DIAGNOSIS — I10 Essential (primary) hypertension: Secondary | ICD-10-CM | POA: Diagnosis not present

## 2014-09-20 DIAGNOSIS — I10 Essential (primary) hypertension: Secondary | ICD-10-CM | POA: Diagnosis not present

## 2014-09-21 DIAGNOSIS — I10 Essential (primary) hypertension: Secondary | ICD-10-CM | POA: Diagnosis not present

## 2014-09-22 DIAGNOSIS — I10 Essential (primary) hypertension: Secondary | ICD-10-CM | POA: Diagnosis not present

## 2014-09-23 DIAGNOSIS — I10 Essential (primary) hypertension: Secondary | ICD-10-CM | POA: Diagnosis not present

## 2014-09-24 DIAGNOSIS — I10 Essential (primary) hypertension: Secondary | ICD-10-CM | POA: Diagnosis not present

## 2014-09-25 DIAGNOSIS — I10 Essential (primary) hypertension: Secondary | ICD-10-CM | POA: Diagnosis not present

## 2014-09-26 DIAGNOSIS — I10 Essential (primary) hypertension: Secondary | ICD-10-CM | POA: Diagnosis not present

## 2014-09-27 DIAGNOSIS — I10 Essential (primary) hypertension: Secondary | ICD-10-CM | POA: Diagnosis not present

## 2014-09-28 DIAGNOSIS — I10 Essential (primary) hypertension: Secondary | ICD-10-CM | POA: Diagnosis not present

## 2014-09-29 DIAGNOSIS — I10 Essential (primary) hypertension: Secondary | ICD-10-CM | POA: Diagnosis not present

## 2014-09-30 DIAGNOSIS — I10 Essential (primary) hypertension: Secondary | ICD-10-CM | POA: Diagnosis not present

## 2014-10-01 DIAGNOSIS — I10 Essential (primary) hypertension: Secondary | ICD-10-CM | POA: Diagnosis not present

## 2014-10-02 DIAGNOSIS — I10 Essential (primary) hypertension: Secondary | ICD-10-CM | POA: Diagnosis not present

## 2014-10-03 DIAGNOSIS — I10 Essential (primary) hypertension: Secondary | ICD-10-CM | POA: Diagnosis not present

## 2014-10-04 DIAGNOSIS — I10 Essential (primary) hypertension: Secondary | ICD-10-CM | POA: Diagnosis not present

## 2014-10-05 DIAGNOSIS — I10 Essential (primary) hypertension: Secondary | ICD-10-CM | POA: Diagnosis not present

## 2014-10-06 ENCOUNTER — Encounter: Payer: Self-pay | Admitting: Gastroenterology

## 2014-10-06 DIAGNOSIS — I10 Essential (primary) hypertension: Secondary | ICD-10-CM | POA: Diagnosis not present

## 2014-10-07 ENCOUNTER — Other Ambulatory Visit: Payer: Self-pay | Admitting: Internal Medicine

## 2014-10-07 DIAGNOSIS — I10 Essential (primary) hypertension: Secondary | ICD-10-CM | POA: Diagnosis not present

## 2014-10-08 DIAGNOSIS — I10 Essential (primary) hypertension: Secondary | ICD-10-CM | POA: Diagnosis not present

## 2014-10-09 DIAGNOSIS — I10 Essential (primary) hypertension: Secondary | ICD-10-CM | POA: Diagnosis not present

## 2014-10-10 DIAGNOSIS — J449 Chronic obstructive pulmonary disease, unspecified: Secondary | ICD-10-CM | POA: Diagnosis not present

## 2014-10-10 DIAGNOSIS — I10 Essential (primary) hypertension: Secondary | ICD-10-CM | POA: Diagnosis not present

## 2014-10-11 DIAGNOSIS — I10 Essential (primary) hypertension: Secondary | ICD-10-CM | POA: Diagnosis not present

## 2014-10-12 DIAGNOSIS — I10 Essential (primary) hypertension: Secondary | ICD-10-CM | POA: Diagnosis not present

## 2014-10-13 DIAGNOSIS — I10 Essential (primary) hypertension: Secondary | ICD-10-CM | POA: Diagnosis not present

## 2014-10-14 DIAGNOSIS — I10 Essential (primary) hypertension: Secondary | ICD-10-CM | POA: Diagnosis not present

## 2014-10-15 DIAGNOSIS — I10 Essential (primary) hypertension: Secondary | ICD-10-CM | POA: Diagnosis not present

## 2014-10-15 DIAGNOSIS — J449 Chronic obstructive pulmonary disease, unspecified: Secondary | ICD-10-CM | POA: Diagnosis not present

## 2014-10-16 DIAGNOSIS — I10 Essential (primary) hypertension: Secondary | ICD-10-CM | POA: Diagnosis not present

## 2014-10-17 DIAGNOSIS — I10 Essential (primary) hypertension: Secondary | ICD-10-CM | POA: Diagnosis not present

## 2014-10-17 DIAGNOSIS — J449 Chronic obstructive pulmonary disease, unspecified: Secondary | ICD-10-CM | POA: Diagnosis not present

## 2014-10-18 DIAGNOSIS — I10 Essential (primary) hypertension: Secondary | ICD-10-CM | POA: Diagnosis not present

## 2014-10-19 DIAGNOSIS — I10 Essential (primary) hypertension: Secondary | ICD-10-CM | POA: Diagnosis not present

## 2014-10-20 DIAGNOSIS — I10 Essential (primary) hypertension: Secondary | ICD-10-CM | POA: Diagnosis not present

## 2014-10-21 DIAGNOSIS — I10 Essential (primary) hypertension: Secondary | ICD-10-CM | POA: Diagnosis not present

## 2014-10-23 ENCOUNTER — Ambulatory Visit (INDEPENDENT_AMBULATORY_CARE_PROVIDER_SITE_OTHER): Payer: Commercial Managed Care - HMO | Admitting: Internal Medicine

## 2014-10-23 ENCOUNTER — Encounter: Payer: Self-pay | Admitting: Internal Medicine

## 2014-10-23 VITALS — BP 140/70 | HR 82 | Temp 98.2°F | Resp 20 | Ht 61.75 in | Wt 197.0 lb

## 2014-10-23 DIAGNOSIS — J9611 Chronic respiratory failure with hypoxia: Secondary | ICD-10-CM | POA: Diagnosis not present

## 2014-10-23 DIAGNOSIS — J449 Chronic obstructive pulmonary disease, unspecified: Secondary | ICD-10-CM

## 2014-10-23 DIAGNOSIS — I10 Essential (primary) hypertension: Secondary | ICD-10-CM | POA: Diagnosis not present

## 2014-10-23 NOTE — Progress Notes (Signed)
Subjective:    Patient ID: Pamela Hampton, female    DOB: 07/30/1940, 74 y.o.   MRN: [id number]  HPI  74 year old patient who is seen today for a face to face evaluation for a mobility evaluation and to assist the patient in obtaining a powered wheelchair. Medical problems include oxygen dependent severe COPD.  She is nonambulatory due to dyspnea on exertion and resting hypoxemia that worsens with activity.  At the present time, she is maintained on 3 L of oxygen per minute.  She is followed closely by pulmonary medicine. A power chair will greatly assist in the home and unable patient to perform aspects of daily living such as toileting and dressing and other self-care activities. Due to general debility and poor exercise tolerance,  a manual wheelchair or walker is not feasible.  Likewise, a scooter will not be suitable for home environment due to the steering radius of the device and the patient's oxygen requirements and need for portable oxygen tanks. Due to forced inactivity and deconditioning, her upper and lower extremity strength is judged to be 3/5.  Manual wheelchairs will not be feasible due to a combination of upper extremity weakness as well as severe oxygen desaturation with exertion. The patient is alert without cognitive deficits and has the mental capabilities to operate a powered wheelchair safely and has a willingness to use this in the home.  Past Medical History  Diagnosis Date  . Allergy     Rhinitis  . Depression   . GERD (gastroesophageal reflux disease)     Barrett's esophagus  . Hyperlipidemia   . Hypertension   . Osteoporosis   . Seizures   . Barrett's esophagus 07/19/2004  . Anemia   . Obesity   . Pneumonia   . Stroke   . Sleep apnea   . Hepatitis   . Arthritis   . Blood transfusion   . COPD (chronic obstructive pulmonary disease)     wears 3 liters of o2 continious  . Emphysema of lung   . Ulcer   . Vaginal cancer   . Hiatal hernia     History    Social History  . Marital Status: Divorced    Spouse Name: N/A  . Number of Children: 4  . Years of Education: N/A   Occupational History  . disaled    Social History Main Topics  . Smoking status: Former Smoker -- 2.00 packs/day for 30 years    Types: Cigarettes    Quit date: 07/28/2002  . Smokeless tobacco: Never Used  . Alcohol Use: No  . Drug Use: No  . Sexual Activity: Not Currently   Other Topics Concern  . Not on file   Social History Narrative    Past Surgical History  Procedure Laterality Date  . Orif hip fracture      right  . Cholecystectomy    . Hemorrhoid surgery    . Total hip arthroplasty      right  . Abdominal hysterectomy    . 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 Abdominal laproscopy surgery      Family History  Problem Relation Age of Onset  . Heart disease Mother     MI  . Diabetes Mother   . Uterine cancer Mother   . Anemia Mother   . Hypertension Mother   . Stomach cancer Father   . Esophageal cancer Father   . Hypertension Father   .  Diabetes Sister   . Anemia Sister   . Esophageal cancer Brother   . Hypertension Brother   . Diabetes Son   . Diabetes Son   . Brain cancer Brother   . Lung cancer Sister   . Liver cancer Sister   . Colon cancer Neg Hx   . Rectal cancer Neg Hx     No Known Allergies  Current Outpatient Prescriptions on File Prior to Visit  Medication Sig Dispense Refill  . albuterol (PROVENTIL) (2.5 MG/3ML) 0.083% nebulizer solution INHALE THE CONTENTS OF 1 VIAL VIA NEBULIZER 4 TIMES A DAY. 360 mL 12  . clotrimazole-betamethasone (LOTRISONE) cream Apply 1 application topically 2 (two) times daily. 30 g 6  . conjugated estrogens (PREMARIN) vaginal cream Place 1 Applicatorful vaginally daily. 42.5 g 12  . CRESTOR 20 MG tablet TAKE 1 TABLET BY MOUTH EVERY OTHER DAY. 90 tablet 1  . escitalopram (LEXAPRO) 10 MG tablet Take 1 tablet (10 mg total) by  mouth daily. 30 tablet 5  . Ferrous Sulfate Dried 200 (65 FE) MG TABS Take 200 mg by mouth daily. 30 tablet 6  . fluconazole (DIFLUCAN) 150 MG tablet Take 1 tablet (150 mg total) by mouth once. 1 tablet 0  . fluticasone (FLONASE) 50 MCG/ACT nasal spray Place 2 sprays into both nostrils daily. 16 g 5  . furosemide (LASIX) 40 MG tablet TAKE 1 TABLET (40 MG TOTAL) BY MOUTH DAILY. 30 tablet 5  . ibandronate (BONIVA) 150 MG tablet Take in the morning with a full glass of water, on an empty stomach, 4 tablet 3  . ipratropium (ATROVENT) 0.02 % nebulizer solution Take 2.5 mLs (0.5 mg total) by nebulization 4 (four) times daily. 300 mL 12  . losartan (COZAAR) 25 MG tablet TAKE 1 TABLET BY MOUTH DAILY. 90 tablet 1  . omeprazole (PRILOSEC) 20 MG capsule TAKE 1 CAPSULE BY MOUTH ONCE DAILY. 30 capsule 6  . polyethylene glycol powder (GLYCOLAX/MIRALAX) powder Take 17 g by mouth 2 (two) times daily as needed. 3350 g 1  . potassium chloride SA (K-DUR,KLOR-CON) 20 MEQ tablet TAKE 1 TABLET BY MOUTH DAILY. 30 tablet 5  . PROAIR HFA 108 (90 BASE) MCG/ACT inhaler INHALE 2 PUFFS INTO THE LUNGS EVERY 4 HOURS AS NEEDED FOR SHORTNESS OF BREATH 8.5 g 5  . QVAR 80 MCG/ACT inhaler INHALE 2 PUFFS INTO THE LUNGS 2 (TWO) TIMES DAILY. 8.7 g 6  . sodium chloride (AYR) 0.65 % nasal spray Place 1 spray into the nose as needed for congestion.    . traZODone (DESYREL) 100 MG tablet TAKE 1 TABLET BY MOUTH AT BEDTIME. 90 tablet 1   No current facility-administered medications on file prior to visit.    BP 140/70 mmHg  Pulse 82  Temp(Src) 98.2 F (36.8 C) (Oral)  Resp 20  Ht 5' 1.75" (1.568 m)  Wt 197 lb (89.359 kg)  BMI 36.35 kg/m2  SpO2 96%      Review of Systems  Constitutional: Positive for fatigue.  HENT: Negative for congestion, dental problem, hearing loss, rhinorrhea, sinus pressure, sore throat and tinnitus.   Eyes: Negative for pain, discharge and visual disturbance.  Respiratory: Positive for shortness of  breath. Negative for cough.   Cardiovascular: Negative for chest pain, palpitations and leg swelling.  Gastrointestinal: Negative for nausea, vomiting, abdominal pain, diarrhea, constipation, blood in stool and abdominal distention.  Genitourinary: Negative for dysuria, urgency, frequency, hematuria, flank pain, vaginal bleeding, vaginal discharge, difficulty urinating, vaginal pain and pelvic pain.  Musculoskeletal:  Negative for joint swelling, arthralgias and gait problem.  Skin: Negative for rash.  Neurological: Positive for weakness. Negative for dizziness, syncope, speech difficulty, numbness and headaches.  Hematological: Negative for adenopathy.  Psychiatric/Behavioral: Negative for behavioral problems, dysphoric mood and agitation. The patient is not nervous/anxious.        Objective:   Physical Exam  Constitutional: She is oriented to person, place, and time. She appears well-developed and well-nourished. No distress.  Blood pressure well controlled O2 saturation 86% on 3 L of supple.  Oxygen per minute  Nonambulatory sitting in a wheelchair  HENT:  Head: Normocephalic.  Right Ear: External ear normal.  Left Ear: External ear normal.  Mouth/Throat: Oropharynx is clear and moist.  Eyes: Conjunctivae and EOM are normal. Pupils are equal, round, and reactive to light.  Neck: Normal range of motion. Neck supple. No thyromegaly present.  Cardiovascular: Normal rate, regular rhythm, normal heart sounds and intact distal pulses.   Pulmonary/Chest: Effort normal.  Diminished breath sounds  Abdominal: Soft. Bowel sounds are normal. She exhibits no mass. There is no tenderness.  Musculoskeletal: Normal range of motion. She exhibits no edema.  Lymphadenopathy:    She has no cervical adenopathy.  Neurological: She is alert and oriented to person, place, and time.  Skin: Skin is warm and dry. No rash noted.  Psychiatric: She has a normal mood and affect. Her behavior is normal.           Assessment & Plan:   Advanced COPD.  ,  Face-to-face assessment to qualify patient for a powered wheelchair performed Chronic respiratory failure with hypoxemia.  Continue supplemental O2.  Pulmonary follow-up Hypertension Barrett's esophagus.  Follow-up GI  Recheck here 6 months or as needed

## 2014-10-23 NOTE — Progress Notes (Signed)
Pre visit review using our clinic review tool, if applicable. No additional management support is needed unless otherwise documented below in the visit note. 

## 2014-10-23 NOTE — Patient Instructions (Signed)
Limit your sodium (Salt) intake  Pulmonary follow-up as scheduled  Return in 6 months for follow-up 

## 2014-10-24 DIAGNOSIS — I10 Essential (primary) hypertension: Secondary | ICD-10-CM | POA: Diagnosis not present

## 2014-10-25 ENCOUNTER — Telehealth: Payer: Self-pay | Admitting: Internal Medicine

## 2014-10-25 DIAGNOSIS — I10 Essential (primary) hypertension: Secondary | ICD-10-CM | POA: Diagnosis not present

## 2014-10-25 NOTE — Telephone Encounter (Signed)
Jackelyn Poling called from [facility] and would like you to give her a call   [phone]

## 2014-10-26 ENCOUNTER — Telehealth: Payer: Self-pay | Admitting: Internal Medicine

## 2014-10-26 ENCOUNTER — Other Ambulatory Visit: Payer: Self-pay

## 2014-10-26 DIAGNOSIS — I10 Essential (primary) hypertension: Secondary | ICD-10-CM | POA: Diagnosis not present

## 2014-10-26 NOTE — Telephone Encounter (Signed)
[facility] would you Butch Penny to give her a call in the morning 10/27/14  [phone]

## 2014-10-27 DIAGNOSIS — I10 Essential (primary) hypertension: Secondary | ICD-10-CM | POA: Diagnosis not present

## 2014-10-27 NOTE — Telephone Encounter (Signed)
Called and spoke to [place] with [facility] regarding pt and note sent over for power wheelchair. Jackelyn Poling said unfortunately they do not accept [employer]. She said pt needs to contact [facility] and fine out who they have a contract with for power wheelchairs. Told her okay, thank you.

## 2014-10-27 NOTE — Telephone Encounter (Signed)
Spoke to pt told her [facility] does not accept her insurance. Told her need to contact [facility] and find out who they have contract with for power wheelchairs and then contact them and have them contact me Butch Penny, Dr. Raliegh Ip nurse so we can send order and paperwork over. Pt verbalized understanding.

## 2014-10-28 DIAGNOSIS — I10 Essential (primary) hypertension: Secondary | ICD-10-CM | POA: Diagnosis not present

## 2014-10-29 DIAGNOSIS — I10 Essential (primary) hypertension: Secondary | ICD-10-CM | POA: Diagnosis not present

## 2014-10-30 ENCOUNTER — Encounter: Payer: Self-pay | Admitting: Gastroenterology

## 2014-10-30 DIAGNOSIS — I10 Essential (primary) hypertension: Secondary | ICD-10-CM | POA: Diagnosis not present

## 2014-10-30 NOTE — Telephone Encounter (Signed)
Pt called back and told Constance Holster that [place] will be sending paperwork for power wheelchair.

## 2014-10-31 ENCOUNTER — Ambulatory Visit: Payer: Commercial Managed Care - HMO | Admitting: Internal Medicine

## 2014-10-31 DIAGNOSIS — I10 Essential (primary) hypertension: Secondary | ICD-10-CM | POA: Diagnosis not present

## 2014-11-01 DIAGNOSIS — I10 Essential (primary) hypertension: Secondary | ICD-10-CM | POA: Diagnosis not present

## 2014-11-02 DIAGNOSIS — I10 Essential (primary) hypertension: Secondary | ICD-10-CM | POA: Diagnosis not present

## 2014-11-03 ENCOUNTER — Other Ambulatory Visit: Payer: Self-pay | Admitting: Internal Medicine

## 2014-11-03 DIAGNOSIS — I10 Essential (primary) hypertension: Secondary | ICD-10-CM | POA: Diagnosis not present

## 2014-11-04 DIAGNOSIS — I10 Essential (primary) hypertension: Secondary | ICD-10-CM | POA: Diagnosis not present

## 2014-11-05 DIAGNOSIS — I10 Essential (primary) hypertension: Secondary | ICD-10-CM | POA: Diagnosis not present

## 2014-11-06 ENCOUNTER — Ambulatory Visit (INDEPENDENT_AMBULATORY_CARE_PROVIDER_SITE_OTHER): Payer: Commercial Managed Care - HMO | Admitting: Internal Medicine

## 2014-11-06 ENCOUNTER — Ambulatory Visit (INDEPENDENT_AMBULATORY_CARE_PROVIDER_SITE_OTHER)
Admission: RE | Admit: 2014-11-06 | Discharge: 2014-11-06 | Disposition: A | Discharge status: Home / Self Care | Payer: Commercial Managed Care - HMO | Source: Ambulatory Visit | Attending: Internal Medicine | Admitting: Internal Medicine

## 2014-11-06 ENCOUNTER — Encounter: Payer: Self-pay | Admitting: Internal Medicine

## 2014-11-06 VITALS — BP 118/64 | HR 82 | Ht 61.0 in | Wt 187.0 lb

## 2014-11-06 DIAGNOSIS — J9611 Chronic respiratory failure with hypoxia: Secondary | ICD-10-CM

## 2014-11-06 DIAGNOSIS — J449 Chronic obstructive pulmonary disease, unspecified: Secondary | ICD-10-CM | POA: Diagnosis not present

## 2014-11-06 DIAGNOSIS — I10 Essential (primary) hypertension: Secondary | ICD-10-CM | POA: Diagnosis not present

## 2014-11-06 NOTE — Telephone Encounter (Signed)
Received paperwork from [facility], filled out and signed by Dr.K. Faxed back and pt aware.

## 2014-11-06 NOTE — Progress Notes (Signed)
Subjective:    Patient ID: Pamela Hampton, female    DOB: 07/31/1940, 74 y.o.   MRN: [id number]  HPI   PCP Nyoka Cowden, MD  # 60 pack smoker quit 2004.   # Frequent UTI. Gyn cancer 1998  -s/p chemo Rx and. xrt = strong family hx of cancer  #lung imagin  - Feb 2013: No evicende of cancer on CT Chest - dec 2013 0 clear cxrf  #Obesity  - >  30# weight gain since 2010-2012  - Body mass index is 34.86 kg/(m^2).   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 Ratio 51 and DLCO 6.7/30\ Status post pulmonary rehabilitation summer 2013   #AECOPD - September 2013 treated in the office with doxycycline and prednisone on 04/14/2012 - December 2013 4 emergency room treatment with prednisone and antibiotics - July 2014 office treatment with Levaquin and prednisone - Nov 2014: office Rx - March 2015: office telpehone Rx - doxy/[pred - July 2015 - telephone RX pred alone - heat related aecopd - dec 2015 - telephone Rx with pred alone  ...................... OV 03/13/2014  Chief Complaint  Patient presents with  . Follow-up    Pt states her breathing is unchanged. Pt c/o DOE, prod cough with green and white. pt states her cough is worse in monring and when outdoors. Pt states when she becomes very dyspneic then she will experience mid sternal  CP. Pt states she received 2 units of blood in 01/2014 d/t hgb at 7.5   FU COPD with cor pulmonale  - doing well. NO admissions since nov/dec 2014 when last seen. [place] [facility] 2015: Rx over phone for aecopd. Continues qvar and duoneb without a problem. On 4L o2.   Past hx: has chronic anemia and 4 wweeks ago PCP Nyoka Cowden, MD gave 2 units prbc for hgg 7s. Feels better after that. Also reports chronic nasal  stuffiness and head cold and wants ENT referral   OV 11/06/2014  Chief Complaint  Patient presents with  . Follow-up    Pt c/o prod cough with yellow mucus, SOB with activity. Right sided chest tightness occasionally. Denies any chest congestion, n/v/d. Wants to talk about going back to [facility].   FU COPD with cor pulmonarle  - last seen August 2015. Since that she called in once in December 2015 for COPD exacerbation and received prednisone alonefor an exacerbation. She is on duo neb and Qvar and according to her and her female companion who is with her today she is fairly compliant with this. 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 She got some injection into the eye and since then she's had partial recovery of vision. Despite this she is able to see and uses inhalers   Labs Jan 2016 hgb 9.5gm% which appears stable   CAT COPD Symptom & Quality of Life Score ([employer] [place] trademark) 0 is no burden. 5 is highest burden 04/14/2012  08/03/2012  02/14/2013  06/10/2013   Never Cough -> Cough all the time 1 4, baseline 2 4  No phlegm in  chest -> Chest is full of phlegm 5 3, baseline 5 4  No chest tightness -> Chest feels very tight 1 0, /imrpoved 4 4  No dyspnea for 1 flight stairs/hill -> Very dyspneic for 1 flight of stairs 5 5, "improved" 5 5  No limitations for ADL at home -> Very limited with ADL at home 3 5 5 5   Confident leaving home -> Not at all confident leaving home 0 2 3 4   Sleep soundly -> Do not sleep soundly because of lung condition 3 4 4 5   Lots of Energy -> No energy at all 3 5 5 5   TOTAL Score (max 40)  21 28 32 36     has a past medical history of Allergy; Depression; GERD (gastroesophageal reflux disease); Hyperlipidemia; Hypertension; Osteoporosis; Seizures; Barrett's esophagus (07/19/2004);  Anemia; Obesity; Pneumonia; Stroke; Sleep apnea; Hepatitis; Arthritis; Blood transfusion; COPD (chronic obstructive pulmonary disease); Emphysema of lung; Ulcer; Vaginal cancer; and Hiatal hernia.   reports that she quit smoking about 12 years ago. Her smoking use included Cigarettes. She has a 60 pack-year smoking history. She has never used smokeless tobacco.  Past Surgical History  Procedure Laterality Date  . Orif hip fracture      right  . Cholecystectomy    . Hemorrhoid surgery    . Total hip arthroplasty      right  . Abdominal hysterectomy    . 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 Abdominal laproscopy surgery      No Known Allergies  Immunization History  Administered Date(s) Administered  . Influenza Split 05/07/2011, 04/14/2012, 06/06/2013  . Influenza Whole 07/08/2007, 04/25/2008, 06/12/2009  . Influenza, High Dose Seasonal PF 08/07/2014  . Pneumococcal Conjugate-13 08/07/2014  . Pneumococcal Polysaccharide-23 07/07/2011  . Tetanus 04/12/2013  . Zoster 04/19/2012    Family History  Problem Relation Age of Onset  . Heart disease Mother     MI  . Diabetes Mother   . Uterine cancer Mother   . Anemia Mother   . Hypertension Mother   . Stomach cancer Father   . Esophageal cancer Father   . Hypertension Father   . Diabetes Sister   . Anemia Sister   . Esophageal cancer Brother   . Hypertension Brother   . Diabetes Son   . Diabetes Son   . Brain cancer Brother   . Lung cancer Sister   . Liver cancer Sister   . Colon cancer Neg Hx   . Rectal cancer Neg Hx      Current outpatient prescriptions:  .  albuterol (PROVENTIL) (2.5 MG/3ML) 0.083% nebulizer solution, INHALE THE CONTENTS OF 1 VIAL VIA NEBULIZER 4 TIMES A DAY., Disp: 360 mL, Rfl: 12 .  clotrimazole-betamethasone (LOTRISONE) cream, Apply 1 application topically 2 (two) times daily., Disp: 30 g, Rfl: 6 .  conjugated estrogens (PREMARIN)  vaginal cream, Place 1 Applicatorful vaginally daily., Disp: 42.5 g, Rfl: 12 .  CRESTOR 20 MG tablet, TAKE 1 TABLET BY MOUTH EVERY OTHER DAY., Disp: 90 tablet, Rfl: 1 .  escitalopram (LEXAPRO) 10 MG tablet, Take 1 tablet (10 mg total) by mouth daily., Disp: 30 tablet, Rfl: 5 .  Ferrous Sulfate Dried 200 (65 FE) MG TABS, Take 200 mg by mouth daily., Disp: 30 tablet, Rfl: 6 .  fluconazole (DIFLUCAN) 150 MG tablet, Take 1 tablet (150 mg total) by mouth once., Disp: 1 tablet, Rfl: 0 .  fluticasone (FLONASE) 50 MCG/ACT nasal  spray, Place 2 sprays into both nostrils daily., Disp: 16 g, Rfl: 5 .  furosemide (LASIX) 40 MG tablet, TAKE 1 TABLET (40 MG TOTAL) BY MOUTH DAILY., Disp: 30 tablet, Rfl: 5 .  ibandronate (BONIVA) 150 MG tablet, Take in the morning with a full glass of water, on an empty stomach,, Disp: 4 tablet, Rfl: 3 .  ipratropium (ATROVENT) 0.02 % nebulizer solution, Take 2.5 mLs (0.5 mg total) by nebulization 4 (four) times daily., Disp: 300 mL, Rfl: 12 .  losartan (COZAAR) 25 MG tablet, TAKE 1 TABLET BY MOUTH DAILY., Disp: 90 tablet, Rfl: 1 .  omeprazole (PRILOSEC) 20 MG capsule, TAKE 1 CAPSULE BY MOUTH ONCE DAILY., Disp: 30 capsule, Rfl: 6 .  polyethylene glycol powder (GLYCOLAX/MIRALAX) powder, Take 17 g by mouth 2 (two) times daily as needed., Disp: 3350 g, Rfl: 1 .  potassium chloride SA (K-DUR,KLOR-CON) 20 MEQ tablet, TAKE 1 TABLET BY MOUTH DAILY., Disp: 30 tablet, Rfl: 5 .  PROAIR HFA 108 (90 BASE) MCG/ACT inhaler, INHALE 2 PUFFS INTO THE LUNGS EVERY 4 HOURS AS NEEDED FOR SHORTNESS OF BREATH, Disp: 8.5 g, Rfl: 5 .  QVAR 80 MCG/ACT inhaler, INHALE 2 PUFFS INTO THE LUNGS 2 TIMES DAILY., Disp: 8.7 g, Rfl: 0 .  sodium chloride (AYR) 0.65 % nasal spray, Place 1 spray into the nose as needed for congestion., Disp: , Rfl:  .  traZODone (DESYREL) 100 MG tablet, TAKE 1 TABLET BY MOUTH AT BEDTIME., Disp: 90 tablet, Rfl: 1     Review of Systems  Constitutional: Positive for fatigue. Negative  for fever and unexpected weight change.  HENT: Positive for sneezing. Negative for congestion, dental problem, ear pain, mouth sores, nosebleeds, postnasal drip, rhinorrhea, sinus pressure, sore throat and trouble swallowing.   Eyes: Negative for redness and itching.  Respiratory: Positive for cough, chest tightness and shortness of breath. Negative for wheezing.   Cardiovascular: Positive for leg swelling. Negative for chest pain and palpitations.  Genitourinary: Negative for dysuria.  Neurological: Negative for headaches.  Hematological: Bruises/bleeds easily.  Psychiatric/Behavioral: Negative for dysphoric mood.       Objective:   Physical Exam  Constitutional: She is oriented to person, place, and time. She appears well-developed and well-nourished. No distress.  Obese Sitting in wheel chair  HENT:  Head: Normocephalic and atraumatic.  Right Ear: External ear normal.  Left Ear: External ear normal.  Mouth/Throat: Oropharynx is clear and moist. No oropharyngeal exudate.  o2 on  Eyes: Conjunctivae and EOM are normal. Pupils are equal, round, and reactive to light. Right eye exhibits no discharge. Left eye exhibits no discharge. No scleral icterus.  Reports left eye vision loss and partial R eye vision loss  Neck: Normal range of motion. Neck supple. No JVD present. No tracheal deviation present. No thyromegaly present.  Cardiovascular: Normal rate, regular rhythm, normal heart sounds and intact distal pulses.  Exam reveals no gallop and no friction rub.   No murmur heard. Pulmonary/Chest: Effort normal and breath sounds normal. No respiratory distress. She has no wheezes. She has no rales. She exhibits no tenderness.  barreel chest Overall aE diminished  Abdominal: Soft. Bowel sounds are normal. She exhibits no distension and no mass. There is no tenderness. There is no rebound and no guarding.  Musculoskeletal: Normal range of motion. She exhibits no edema or tenderness.    Lymphadenopathy:    She has no cervical adenopathy.  Neurological: She is alert and oriented to person, place, and time. She has normal reflexes.  No cranial nerve deficit. She exhibits normal muscle tone. Coordination normal.  Skin: Skin is warm and dry. No rash noted. She is not diaphoretic. No erythema. No pallor.  Psychiatric: She has a normal mood and affect. Her behavior is normal. Judgment and thought content normal.  Vitals reviewed.    Filed Vitals:   11/06/14 1556  BP: 118/64  Pulse: 82  Height: 5\' 1"  (1.549 m)  Weight: 187 lb (84.823 kg)  SpO2: 94%        Assessment & Plan:     ICD-9-CM ICD-10-CM   1. COPD, severe 496 J44.9 DG Chest 2 View  2. Chronic respiratory failure with hypoxia 518.83 J96.11 DG Chest 2 View   799.02     #COPD with chronic respiratory failure and cor pulmonale and recurrent exacerbation Stable disease  Do CXR 2 view - been over 2 years since you had chest xray Continue Albuterol and Ipratropium Neb Four times a day   Continue on QVAR 2 puffs Twice daily  , rinse after use.  Continue on Oxygen 4l/m .  follow up Dr. Chase Caller in 6 months and As needed   Please contact office for sooner follow up if symptoms do not improve or worsen or seek emergency care     #Followup  6 months or sooner if needed ; wil discuss lung cancer screen at followu     Dr. Brand Males, M.D., [facility].C.P Pulmonary and Critical Care Medicine Staff Physician [facility] Pager: [phone], If no answer or between  15:00h - 7:00h: call [phone]  11/06/2014 4:26 PM

## 2014-11-06 NOTE — Patient Instructions (Addendum)
ICD-9-CM ICD-10-CM   1. COPD, severe 496 J44.9   2. Chronic respiratory failure with hypoxia 518.83 J96.11    799.02       #COPD with chronic respiratory failure and cor pulmonale and recurrent exacerbation Stable disease  Do CXR 2 view - been over 2 years since you had chest xray Continue Albuterol and Ipratropium Neb Four times a day   Continue on QVAR 2 puffs Twice daily  , rinse after use.  Continue on Oxygen 4l/m .  follow up Dr. Chase Caller in 6 months and As needed   Please contact office for sooner follow up if symptoms do not improve or worsen or seek emergency care     #Followup  6 months or sooner if needed ; wil discuss lung cancer screen at followup

## 2014-11-07 DIAGNOSIS — I10 Essential (primary) hypertension: Secondary | ICD-10-CM | POA: Diagnosis not present

## 2014-11-08 DIAGNOSIS — I10 Essential (primary) hypertension: Secondary | ICD-10-CM | POA: Diagnosis not present

## 2014-11-09 DIAGNOSIS — I10 Essential (primary) hypertension: Secondary | ICD-10-CM | POA: Diagnosis not present

## 2014-11-10 DIAGNOSIS — J449 Chronic obstructive pulmonary disease, unspecified: Secondary | ICD-10-CM | POA: Diagnosis not present

## 2014-11-10 DIAGNOSIS — I10 Essential (primary) hypertension: Secondary | ICD-10-CM | POA: Diagnosis not present

## 2014-11-11 DIAGNOSIS — I10 Essential (primary) hypertension: Secondary | ICD-10-CM | POA: Diagnosis not present

## 2014-11-12 DIAGNOSIS — I10 Essential (primary) hypertension: Secondary | ICD-10-CM | POA: Diagnosis not present

## 2014-11-13 DIAGNOSIS — I10 Essential (primary) hypertension: Secondary | ICD-10-CM | POA: Diagnosis not present

## 2014-11-14 DIAGNOSIS — I10 Essential (primary) hypertension: Secondary | ICD-10-CM | POA: Diagnosis not present

## 2014-11-15 DIAGNOSIS — J449 Chronic obstructive pulmonary disease, unspecified: Secondary | ICD-10-CM | POA: Diagnosis not present

## 2014-11-15 DIAGNOSIS — I10 Essential (primary) hypertension: Secondary | ICD-10-CM | POA: Diagnosis not present

## 2014-11-16 DIAGNOSIS — I10 Essential (primary) hypertension: Secondary | ICD-10-CM | POA: Diagnosis not present

## 2014-11-17 DIAGNOSIS — J449 Chronic obstructive pulmonary disease, unspecified: Secondary | ICD-10-CM | POA: Diagnosis not present

## 2014-11-17 DIAGNOSIS — I10 Essential (primary) hypertension: Secondary | ICD-10-CM | POA: Diagnosis not present

## 2014-11-18 DIAGNOSIS — I10 Essential (primary) hypertension: Secondary | ICD-10-CM | POA: Diagnosis not present

## 2014-11-19 DIAGNOSIS — I10 Essential (primary) hypertension: Secondary | ICD-10-CM | POA: Diagnosis not present

## 2014-11-20 DIAGNOSIS — I10 Essential (primary) hypertension: Secondary | ICD-10-CM | POA: Diagnosis not present

## 2014-11-21 DIAGNOSIS — I10 Essential (primary) hypertension: Secondary | ICD-10-CM | POA: Diagnosis not present

## 2014-11-22 DIAGNOSIS — I10 Essential (primary) hypertension: Secondary | ICD-10-CM | POA: Diagnosis not present

## 2014-11-23 DIAGNOSIS — J449 Chronic obstructive pulmonary disease, unspecified: Secondary | ICD-10-CM | POA: Diagnosis not present

## 2014-11-23 DIAGNOSIS — I10 Essential (primary) hypertension: Secondary | ICD-10-CM | POA: Diagnosis not present

## 2014-11-23 DIAGNOSIS — J9611 Chronic respiratory failure with hypoxia: Secondary | ICD-10-CM | POA: Diagnosis not present

## 2014-11-24 DIAGNOSIS — I10 Essential (primary) hypertension: Secondary | ICD-10-CM | POA: Diagnosis not present

## 2014-11-25 DIAGNOSIS — I10 Essential (primary) hypertension: Secondary | ICD-10-CM | POA: Diagnosis not present

## 2014-11-26 DIAGNOSIS — I10 Essential (primary) hypertension: Secondary | ICD-10-CM | POA: Diagnosis not present

## 2014-11-27 DIAGNOSIS — I10 Essential (primary) hypertension: Secondary | ICD-10-CM | POA: Diagnosis not present

## 2014-11-28 DIAGNOSIS — I10 Essential (primary) hypertension: Secondary | ICD-10-CM | POA: Diagnosis not present

## 2014-11-29 DIAGNOSIS — I10 Essential (primary) hypertension: Secondary | ICD-10-CM | POA: Diagnosis not present

## 2014-11-30 ENCOUNTER — Telehealth: Payer: Self-pay | Admitting: *Deleted

## 2014-11-30 DIAGNOSIS — I10 Essential (primary) hypertension: Secondary | ICD-10-CM | POA: Diagnosis not present

## 2014-11-30 NOTE — Telephone Encounter (Signed)
Office visit please

## 2014-11-30 NOTE — Telephone Encounter (Signed)
Ov per Deatra Ina,   Hanley Seamen 5-9 Monday at 245 pm arrive 15 minutes early appt. With [employer].  Pt verbalized understanding of reason for OV.  Lelan Pons PV

## 2014-11-30 NOTE — Telephone Encounter (Signed)
Dr Deatra Ina,  This pt is scheduled in the [facility] for a recall EGD 5-24 Tuesday. She has chronic copd and respiratory failure with o2 use at 4 L via [place].  She has hx of gerd, barrett's, a stricture, anemia, seizure D/O. Do you want her seen in the office or a direct hospital case.    Please advise  Marijean Niemann

## 2014-12-01 ENCOUNTER — Encounter: Payer: Self-pay | Admitting: *Deleted

## 2014-12-01 ENCOUNTER — Other Ambulatory Visit: Payer: Self-pay | Admitting: Internal Medicine

## 2014-12-01 DIAGNOSIS — I10 Essential (primary) hypertension: Secondary | ICD-10-CM | POA: Diagnosis not present

## 2014-12-02 DIAGNOSIS — I10 Essential (primary) hypertension: Secondary | ICD-10-CM | POA: Diagnosis not present

## 2014-12-03 DIAGNOSIS — I10 Essential (primary) hypertension: Secondary | ICD-10-CM | POA: Diagnosis not present

## 2014-12-04 ENCOUNTER — Ambulatory Visit (INDEPENDENT_AMBULATORY_CARE_PROVIDER_SITE_OTHER): Payer: Commercial Managed Care - HMO | Admitting: Physician Assistant

## 2014-12-04 ENCOUNTER — Encounter: Payer: Self-pay | Admitting: Physician Assistant

## 2014-12-04 VITALS — BP 130/50 | HR 88 | Ht 61.0 in | Wt 192.1 lb

## 2014-12-04 DIAGNOSIS — I10 Essential (primary) hypertension: Secondary | ICD-10-CM | POA: Diagnosis not present

## 2014-12-04 DIAGNOSIS — K219 Gastro-esophageal reflux disease without esophagitis: Secondary | ICD-10-CM

## 2014-12-04 DIAGNOSIS — K2270 Barrett's esophagus without dysplasia: Secondary | ICD-10-CM | POA: Diagnosis not present

## 2014-12-04 NOTE — Progress Notes (Signed)
Patient ID: Pamela Hampton, female   DOB: [birth year], 74 y.o.   MRN: [id number]    HPI:  Pamela Hampton is a 74 y.o.   female referred by Marletta Lor, MD due to a history of Barrett's esophagus.  [name] has a long-standing history of GERD. She last had an EGD on 10/23/2011 at which time she was noted to have Barrett's esophagus with a hiatal hernia at the GE junction. Biopsies came back with intestinal metaplasia consistent with Barrett's, and she was advised to have surveillance in 3 years and presents to do so. She had been feeling well but over the past couple of months has been having breakthrough heartburn with frequent nocturnal regurgitation. She has some epigastric discomfort and nausea that are worse on an empty stomach and alleviated with ingestion of a small amount of food. She feels full after 2 or 3 bites of food in the morning and then doesn't want to eat for the rest of the day. She has lost 10-15 pounds in the past 6 months. Her bowel movements are normal and she's had no bright red blood per rectum or melena. Her past medical history significant for COPD for which she is on 4 L of oxygen at home, depression, GERD, hyperlipidemia, hypertension, osteoporosis, seizures, obesity, and cor pulmone. She has a history of GYN cancer in 1998 and is status post chemotherapy and radiation therapy.   Past Medical History  Diagnosis Date  . Allergy     Rhinitis  . Depression   . GERD (gastroesophageal reflux disease)     Barrett's esophagus  . Hyperlipidemia   . Hypertension   . Osteoporosis   . Seizures   . Barrett's esophagus 07/19/2004  . Anemia   . Obesity   . Pneumonia   . Stroke   . Sleep apnea   . Hepatitis   . Arthritis   . Blood transfusion   . COPD (chronic obstructive pulmonary disease)     wears 3 liters of o2 continious  . Emphysema of lung   . Ulcer   . Vaginal cancer   . Hiatal hernia     Past Surgical History  Procedure Laterality Date  . Orif hip  fracture Right   . Cholecystectomy    . Hemorrhoid surgery    . Total hip arthroplasty Right   . Abdominal hysterectomy    . 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 Abdominal laproscopy surgery     Family History  Problem Relation Age of Onset  . Heart disease Mother     MI  . Diabetes Mother   . Uterine cancer Mother   . Anemia Mother   . Hypertension Mother   . Stomach cancer Father   . Esophageal cancer Father   . Hypertension Father   . Diabetes Sister   . Anemia Sister   . Esophageal cancer Brother   . Hypertension Brother   . Diabetes Son   . Diabetes Son   . Brain cancer Brother   . Lung cancer Sister   . Liver cancer Sister   . Colon cancer Neg Hx   . Rectal cancer Neg Hx    History  Substance Use Topics  . Smoking status: Former Smoker -- 2.00 packs/day for 30 years    Types: Cigarettes    Quit date: 07/28/2002  . Smokeless tobacco: Never Used  . Alcohol Use: No   Current Outpatient Prescriptions  Medication Sig Dispense Refill  . albuterol (PROVENTIL) (2.5 MG/3ML) 0.083% nebulizer solution INHALE THE CONTENTS OF 1 VIAL VIA NEBULIZER 4 TIMES A DAY. 360 mL 12  . beclomethasone (QVAR) 80 MCG/ACT inhaler Inhale 2 puffs into the lungs 2 (two) times daily. 8.7 g 6  . clotrimazole-betamethasone (LOTRISONE) cream Apply 1 application topically 2 (two) times daily. 30 g 6  . conjugated estrogens (PREMARIN) vaginal cream Place 1 Applicatorful vaginally daily. 42.5 g 12  . CRESTOR 20 MG tablet TAKE 1 TABLET BY MOUTH EVERY OTHER DAY. 90 tablet 1  . escitalopram (LEXAPRO) 10 MG tablet Take 1 tablet (10 mg total) by mouth daily. 30 tablet 5  . Ferrous Sulfate Dried 200 (65 FE) MG TABS Take 200 mg by mouth daily. 30 tablet 6  . fluticasone (FLONASE) 50 MCG/ACT nasal spray Place 2 sprays into both nostrils daily. 16 g 5  . furosemide (LASIX) 40 MG tablet TAKE 1 TABLET (40 MG TOTAL) BY MOUTH DAILY. 30 tablet 5  .  ibandronate (BONIVA) 150 MG tablet Take in the morning with a full glass of water, on an empty stomach, 4 tablet 3  . ipratropium (ATROVENT) 0.02 % nebulizer solution Take 2.5 mLs (0.5 mg total) by nebulization 4 (four) times daily. 300 mL 12  . losartan (COZAAR) 25 MG tablet TAKE 1 TABLET BY MOUTH DAILY. 90 tablet 1  . omeprazole (PRILOSEC) 20 MG capsule TAKE 1 CAPSULE BY MOUTH ONCE DAILY. 30 capsule 6  . OXYGEN Inhale 4 L/min into the lungs continuous.    . polyethylene glycol powder (GLYCOLAX/MIRALAX) powder Take 17 g by mouth 2 (two) times daily as needed. 3350 g 1  . potassium chloride SA (K-DUR,KLOR-CON) 20 MEQ tablet TAKE 1 TABLET BY MOUTH DAILY. 30 tablet 5  . PROAIR HFA 108 (90 BASE) MCG/ACT inhaler INHALE 2 PUFFS INTO THE LUNGS EVERY 4 HOURS AS NEEDED FOR SHORTNESS OF BREATH 8.5 g 5  . sodium chloride (AYR) 0.65 % nasal spray Place 1 spray into the nose as needed for congestion.    . traZODone (DESYREL) 100 MG tablet TAKE 1 TABLET BY MOUTH AT BEDTIME. 90 tablet 1   No current facility-administered medications for this visit.   No Known Allergies   Review of Systems: Constitutional: Positive for fatigue. Negative for fever and unexpected weight change.  HENT: Positive for sneezing. Negative for congestion, dental problem, ear pain, mouth sores, nosebleeds, postnasal drip, rhinorrhea, sinus pressure, sore throat and trouble swallowing.  Eyes: Negative for redness and itching.  Respiratory: Positive for cough, chest tightness and shortness of breath. Negative for wheezing.  Cardiovascular: Positive for leg swelling. Negative for chest pain and palpitations.  GI: Admits to frequent heartburn, epigastric discomfort, nocturnal regurgitation. Genitourinary: Negative for dysuria.  Neurological: Negative for headaches.  Hematological: Bruises/bleeds easily.  Psychiatric/Behavioral: Negative for dysphoric mood.   Studies: Dg Chest 2 View  11/07/2014   CLINICAL DATA:  74 year old  female with severe COPD. Chronic respiratory failure with hypoxia. Subsequent encounter.  EXAM: CHEST  2 VIEW  COMPARISON:  07/20/2012 and earlier.  FINDINGS: Chronic moderate to large gastric hiatal hernia. Stable cardiac size and mediastinal contours. Calcified atherosclerosis of the aorta. Stable lung volumes. Stable coarse/interstitial pulmonary opacity in both lungs. No pneumothorax, pulmonary edema, pleural effusion or consolidation. No acute pulmonary opacity. Stable surgical clips in the abdomen. Osteopenia. No acute osseous abnormality identified.  IMPRESSION: Stable chronic lung disease and moderate to large hiatal hernia. No acute cardiopulmonary abnormality.   Electronically Signed  By: Genevie Ann M.D.   On: 11/07/2014 08:39     Prior Endoscopies:   EGD 10/23/2011 with Barrett's esophagus and a hiatal hernia at the gastroesophageal junction.  Physical Exam: BP 130/50 mmHg  Pulse 88  Ht 5\' 1"  (1.549 m)  Wt 192 lb 2 oz (87.147 kg)  BMI 36.32 kg/m2 Constitutional: Pleasant,well-developed, obese female in no acute distress in a wheelchair with nasal cannula oxygen. HEENT: Normocephalic and atraumatic. Conjunctivae are normal. No scleral icterus. Neck supple. No JVD. No thyromegaly. Cardiovascular: Normal rate, regular rhythm.  Pulmonary/chest: Effort normal and breath sounds normal. No wheezing, rales or rhonchi. Abdominal: Soft, nondistended, nontender. Bowel sounds active throughout. There are no masses palpable. No hepatomegaly. Extremities: no edema Lymphadenopathy: No cervical adenopathy noted. Neurological: Alert and oriented to person place and time. Skin: Skin is warm and dry. No rashes noted. Psychiatric: Normal mood and affect. Behavior is normal.  ASSESSMENT AND PLAN:  74 year old female with a history of GERD and Barrett's here to discuss surveillance endoscopy. Patient has been instructed to adhere to an antireflux regimen and to not eat for 2 hours before she retires in  the evening. She has been using her omeprazole at bedtime and was advised to use it first thing in the morning on an empty stomach instead, and to wait 30 minutes to she has her breakfast. She will be scheduled for an EGD to reevaluate her area of Barrett's as well as to evaluate for gastritis or ulcer.The risks, benefits, and alternatives to endoscopy with possible biopsy and possible dilation were discussed with the patient and they consent to proceed.    Lyrik Buresh, Deloris Ping 12/04/2014, 3:57 PM  CC: Marletta Lor, MD

## 2014-12-04 NOTE — Patient Instructions (Signed)
You have been scheduled for an endoscopy. Please follow written instructions given to you at your visit today. If you use inhalers (even only as needed), please bring them with you on the day of your procedure. Your physician has requested that you go to www.startemmi.com and enter the access code given to you at your visit today. This web site gives a general overview about your procedure. However, you should still follow specific instructions given to you by our office regarding your preparation for the procedure. Put your Oxygen on 4 liters Take your omeprazole 20 mg 1 by mouth 30 minutes prior to breakfast.

## 2014-12-05 ENCOUNTER — Ambulatory Visit: Payer: Medicaid Other | Admitting: Internal Medicine

## 2014-12-05 DIAGNOSIS — I10 Essential (primary) hypertension: Secondary | ICD-10-CM | POA: Diagnosis not present

## 2014-12-05 NOTE — Progress Notes (Signed)
Reviewed and agree with management. Ceferino Lang D. Treshawn Allen, M.D., FACG  

## 2014-12-06 DIAGNOSIS — I10 Essential (primary) hypertension: Secondary | ICD-10-CM | POA: Diagnosis not present

## 2014-12-07 DIAGNOSIS — I10 Essential (primary) hypertension: Secondary | ICD-10-CM | POA: Diagnosis not present

## 2014-12-08 DIAGNOSIS — I10 Essential (primary) hypertension: Secondary | ICD-10-CM | POA: Diagnosis not present

## 2014-12-09 DIAGNOSIS — I10 Essential (primary) hypertension: Secondary | ICD-10-CM | POA: Diagnosis not present

## 2014-12-10 DIAGNOSIS — I10 Essential (primary) hypertension: Secondary | ICD-10-CM | POA: Diagnosis not present

## 2014-12-10 DIAGNOSIS — J449 Chronic obstructive pulmonary disease, unspecified: Secondary | ICD-10-CM | POA: Diagnosis not present

## 2014-12-11 DIAGNOSIS — I10 Essential (primary) hypertension: Secondary | ICD-10-CM | POA: Diagnosis not present

## 2014-12-12 DIAGNOSIS — I10 Essential (primary) hypertension: Secondary | ICD-10-CM | POA: Diagnosis not present

## 2014-12-13 ENCOUNTER — Encounter (HOSPITAL_COMMUNITY): Payer: Self-pay | Admitting: *Deleted

## 2014-12-13 DIAGNOSIS — I10 Essential (primary) hypertension: Secondary | ICD-10-CM | POA: Diagnosis not present

## 2014-12-14 DIAGNOSIS — I10 Essential (primary) hypertension: Secondary | ICD-10-CM | POA: Diagnosis not present

## 2014-12-15 DIAGNOSIS — I10 Essential (primary) hypertension: Secondary | ICD-10-CM | POA: Diagnosis not present

## 2014-12-16 DIAGNOSIS — I10 Essential (primary) hypertension: Secondary | ICD-10-CM | POA: Diagnosis not present

## 2014-12-17 DIAGNOSIS — J449 Chronic obstructive pulmonary disease, unspecified: Secondary | ICD-10-CM | POA: Diagnosis not present

## 2014-12-17 DIAGNOSIS — I10 Essential (primary) hypertension: Secondary | ICD-10-CM | POA: Diagnosis not present

## 2014-12-18 DIAGNOSIS — I10 Essential (primary) hypertension: Secondary | ICD-10-CM | POA: Diagnosis not present

## 2014-12-19 ENCOUNTER — Encounter: Payer: Commercial Managed Care - HMO | Admitting: Gastroenterology

## 2014-12-19 DIAGNOSIS — I10 Essential (primary) hypertension: Secondary | ICD-10-CM | POA: Diagnosis not present

## 2014-12-20 ENCOUNTER — Telehealth: Payer: Self-pay | Admitting: Internal Medicine

## 2014-12-20 DIAGNOSIS — I10 Essential (primary) hypertension: Secondary | ICD-10-CM | POA: Diagnosis not present

## 2014-12-21 ENCOUNTER — Encounter (HOSPITAL_COMMUNITY)
Admission: RE | Disposition: A | Discharge status: Home / Self Care | Payer: Self-pay | Source: Ambulatory Visit | Attending: Gastroenterology

## 2014-12-21 ENCOUNTER — Ambulatory Visit (HOSPITAL_COMMUNITY): Payer: Commercial Managed Care - HMO | Admitting: Anesthesiology

## 2014-12-21 ENCOUNTER — Ambulatory Visit (HOSPITAL_COMMUNITY)
Admission: RE | Admit: 2014-12-21 | Discharge: 2014-12-21 | Disposition: A | Discharge status: Home / Self Care | Payer: Commercial Managed Care - HMO | Source: Ambulatory Visit | Attending: Gastroenterology | Admitting: Gastroenterology

## 2014-12-21 ENCOUNTER — Encounter (HOSPITAL_COMMUNITY): Payer: Self-pay | Admitting: *Deleted

## 2014-12-21 DIAGNOSIS — I10 Essential (primary) hypertension: Secondary | ICD-10-CM | POA: Insufficient documentation

## 2014-12-21 DIAGNOSIS — Z8701 Personal history of pneumonia (recurrent): Secondary | ICD-10-CM | POA: Diagnosis not present

## 2014-12-21 DIAGNOSIS — E785 Hyperlipidemia, unspecified: Secondary | ICD-10-CM | POA: Diagnosis not present

## 2014-12-21 DIAGNOSIS — M810 Age-related osteoporosis without current pathological fracture: Secondary | ICD-10-CM | POA: Diagnosis not present

## 2014-12-21 DIAGNOSIS — F329 Major depressive disorder, single episode, unspecified: Secondary | ICD-10-CM | POA: Diagnosis not present

## 2014-12-21 DIAGNOSIS — Z87891 Personal history of nicotine dependence: Secondary | ICD-10-CM | POA: Insufficient documentation

## 2014-12-21 DIAGNOSIS — K449 Diaphragmatic hernia without obstruction or gangrene: Secondary | ICD-10-CM | POA: Insufficient documentation

## 2014-12-21 DIAGNOSIS — Z9049 Acquired absence of other specified parts of digestive tract: Secondary | ICD-10-CM | POA: Insufficient documentation

## 2014-12-21 DIAGNOSIS — Z96641 Presence of right artificial hip joint: Secondary | ICD-10-CM | POA: Diagnosis not present

## 2014-12-21 DIAGNOSIS — J449 Chronic obstructive pulmonary disease, unspecified: Secondary | ICD-10-CM | POA: Insufficient documentation

## 2014-12-21 DIAGNOSIS — K2270 Barrett's esophagus without dysplasia: Secondary | ICD-10-CM

## 2014-12-21 DIAGNOSIS — K259 Gastric ulcer, unspecified as acute or chronic, without hemorrhage or perforation: Secondary | ICD-10-CM | POA: Insufficient documentation

## 2014-12-21 DIAGNOSIS — E669 Obesity, unspecified: Secondary | ICD-10-CM | POA: Diagnosis not present

## 2014-12-21 DIAGNOSIS — K219 Gastro-esophageal reflux disease without esophagitis: Secondary | ICD-10-CM | POA: Diagnosis not present

## 2014-12-21 HISTORY — DX: Reserved for inherently not codable concepts without codable children: IMO0001

## 2014-12-21 HISTORY — DX: Unspecified hearing loss, unspecified ear: H91.90

## 2014-12-21 HISTORY — DX: Dependence on supplemental oxygen: Z99.81

## 2014-12-21 HISTORY — PX: ESOPHAGOGASTRODUODENOSCOPY (EGD) WITH PROPOFOL: SHX5813

## 2014-12-21 SURGERY — ESOPHAGOGASTRODUODENOSCOPY (EGD) WITH PROPOFOL
Anesthesia: Monitor Anesthesia Care

## 2014-12-21 MED ORDER — BUTAMBEN-TETRACAINE-BENZOCAINE 2-2-14 % EX AERO
INHALATION_SPRAY | CUTANEOUS | Status: DC | PRN
Start: 2014-12-21 — End: 2014-12-21
  Administered 2014-12-21: 2 via TOPICAL

## 2014-12-21 MED ORDER — PROPOFOL INFUSION 10 MG/ML OPTIME
INTRAVENOUS | Status: DC | PRN
  Administered 2014-12-21: 50 ug/kg/min via INTRAVENOUS

## 2014-12-21 MED ORDER — LACTATED RINGERS IV SOLN
INTRAVENOUS | Status: DC
  Administered 2014-12-21: 1000 mL via INTRAVENOUS
  Administered 2014-12-21: 13:00:00 via INTRAVENOUS

## 2014-12-21 MED ORDER — SODIUM CHLORIDE 0.9 % IV SOLN: INTRAVENOUS | Status: DC

## 2014-12-21 MED ORDER — LIDOCAINE HCL (CARDIAC) 20 MG/ML IV SOLN
INTRAVENOUS | Status: DC | PRN
  Administered 2014-12-21: 30 mg via INTRAVENOUS

## 2014-12-21 MED ORDER — PROPOFOL 10 MG/ML IV BOLUS
INTRAVENOUS | Status: AC
  Filled 2014-12-21: qty 20

## 2014-12-21 SURGICAL SUPPLY — 15 items

## 2014-12-21 NOTE — Interval H&P Note (Signed)
History and Physical Interval Note:  12/21/2014 12:42 PM  Pamela Hampton  has presented today for surgery, with the diagnosis of barretts, GERD  The various methods of treatment have been discussed with the patient and family. After consideration of risks, benefits and other options for treatment, the patient has consented to  Procedure(s): ESOPHAGOGASTRODUODENOSCOPY (EGD) WITH PROPOFOL (N/A) as a surgical intervention .  The patient's history has been reviewed, patient examined, no change in status, stable for surgery.  I have reviewed the patient's chart and labs.  Questions were answered to the patient's satisfaction.    The recent H&P (dated *12/04/14**) was reviewed, the patient was examined and there is no change in the patients condition since that H&P was completed.   Erskine Emery  12/21/2014, 12:42 PM    Erskine Emery

## 2014-12-21 NOTE — Transfer of Care (Signed)
Immediate Anesthesia Transfer of Care Note  Patient: Pamela Hampton  Procedure(s) Performed: Procedure(s): ESOPHAGOGASTRODUODENOSCOPY (EGD) WITH PROPOFOL (N/A)  Patient Location: PACU  Anesthesia Type:MAC  Level of Consciousness: awake, alert  and oriented  Airway & Oxygen Therapy: Patient Spontanous Breathing and Patient connected to nasal cannula oxygen  Post-op Assessment: Report given to RN and Post -op Vital signs reviewed and stable  Post vital signs: Reviewed and stable  Last Vitals:  Filed Vitals:   12/21/14 1147  BP: 171/71  Temp: 36.7 C  Resp: 15    Complications: No apparent anesthesia complications

## 2014-12-21 NOTE — Anesthesia Preprocedure Evaluation (Addendum)
Anesthesia Evaluation  Patient identified by MRN, date of birth, ID band Patient awake    Reviewed: Allergy & Precautions, NPO status , Patient's Chart, lab work & pertinent test results  Airway Mallampati: II  TM Distance: >3 FB Neck ROM: Full    Dental   Pulmonary sleep apnea , pneumonia -, COPDformer smoker,  breath sounds clear to auscultation        Cardiovascular hypertension, Rhythm:Regular Rate:Normal     Neuro/Psych Seizures -,   Neuromuscular disease CVA    GI/Hepatic hiatal hernia, GERD-  ,(+) Hepatitis -  Endo/Other    Renal/GU      Musculoskeletal   Abdominal   Peds  Hematology   Anesthesia Other Findings   Reproductive/Obstetrics                            Anesthesia Physical Anesthesia Plan  ASA: III  Anesthesia Plan: MAC   Post-op Pain Management:    Induction: Intravenous  Airway Management Planned: Nasal Cannula  Additional Equipment:   Intra-op Plan:   Post-operative Plan:   Informed Consent: I have reviewed the patients History and Physical, chart, labs and discussed the procedure including the risks, benefits and alternatives for the proposed anesthesia with the patient or authorized representative who has indicated his/her understanding and acceptance.     Plan Discussed with: CRNA and Anesthesiologist  Anesthesia Plan Comments:         Anesthesia Quick Evaluation

## 2014-12-21 NOTE — Op Note (Signed)
[facility] [place], [address]   ENDOSCOPY PROCEDURE REPORT  PATIENT: Pamela, Hampton  MR#: [id number] BIRTHDATE: 1940-08-29 , 73  yrs. old GENDER: female ENDOSCOPIST: Inda Castle, MD REFERRED BY:  Bluford Kaufmann, M.D. PROCEDURE DATE:  12/21/2014 PROCEDURE:  EGD w/ biopsy ASA CLASS:     Class III INDICATIONS:  history of Barrett's esophagus. MEDICATIONS: Monitored anesthesia care TOPICAL ANESTHETIC:  DESCRIPTION OF PROCEDURE: After the risks benefits and alternatives of the procedure were thoroughly explained, informed consent was obtained.  The Pentax Gastroscope [id number] endoscope was introduced through the mouth and advanced to the second portion of the duodenum , Without limitations.  The instrument was slowly withdrawn as the mucosa was fully examined.      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  There is minimal streaking of blood..  A 3 cm hiatal hernia was identified.  ESOPHAGUS: There was a 3cm segment of Barrett's esophagus without dysplasia found in the lower third of the esophagus.  The length of circumferential Barrett's was 3cm ([place] C3).  Multiple biopsies were performed.         The scope was then withdrawn from the patient and the procedure completed.  The remainder the exam including the distal stomach and duodenum were normal.  COMPLICATIONS: There were no immediate complications.  ENDOSCOPIC IMPRESSION: 1.  Barrett's esophagus 2.  Cameron erosion 3.  Hernia  RECOMMENDATIONS: Await biopsy results continue omeprazole  REPEAT EXAM:  eSigned:  Inda Castle, MD 12/21/2014 1:07 PM    CC:  PATIENT NAME:  Pamela, Hampton MR#: [id number]

## 2014-12-21 NOTE — Discharge Instructions (Signed)
Esophagogastroduodenoscopy °Care After °Refer to this sheet in the next few weeks. These instructions provide you with information on caring for yourself after your procedure. Your caregiver may also give you more specific instructions. Your treatment has been planned according to current medical practices, but problems sometimes occur. Call your caregiver if you have any problems or questions after your procedure.  °HOME CARE INSTRUCTIONS °· Do not eat or drink anything until the numbing medicine (local anesthetic) has worn off and your gag reflex has returned. You will know that the local anesthetic has worn off when you can swallow comfortably. °· Do not drive for 12 hours after the procedure or as directed by your caregiver. °· Only take medicines as directed by your caregiver. °SEEK MEDICAL CARE IF:  °· You cannot stop coughing. °· You are not urinating at all or less than usual. °SEEK IMMEDIATE MEDICAL CARE IF: °· You have difficulty swallowing. °· You cannot eat or drink. °· You have worsening throat or chest pain. °· You have dizziness, lightheadedness, or you faint. °· You have nausea or vomiting. °· You have chills. °· You have a fever. °· You have severe abdominal pain. °· You have black, tarry, or bloody stools. °Document Released: 06/30/2012 Document Reviewed: 06/30/2012 °ExitCare® Patient Information ©2015 [employer]. This information is not intended to replace advice given to you by your health care provider. Make sure you discuss any questions you have with your health care provider. ° °

## 2014-12-21 NOTE — H&P (View-Only) (Signed)
Patient ID: Pamela Hampton, female   DOB: 1941-03-12, 74 y.o.   MRN: [id number]    HPI:  Pamela Hampton is a 74 y.o.   female referred by Marletta Lor, MD due to a history of Barrett's esophagus.  Pamela Hampton has a long-standing history of GERD. She last had an EGD on 10/23/2011 at which time she was noted to have Barrett's esophagus with a hiatal hernia at the GE junction. Biopsies came back with intestinal metaplasia consistent with Barrett's, and she was advised to have surveillance in 3 years and presents to do so. She had been feeling well but over the past couple of months has been having breakthrough heartburn with frequent nocturnal regurgitation. She has some epigastric discomfort and nausea that are worse on an empty stomach and alleviated with ingestion of a small amount of food. She feels full after 2 or 3 bites of food in the morning and then doesn't want to eat for the rest of the day. She has lost 10-15 pounds in the past 6 months. Her bowel movements are normal and she's had no bright red blood per rectum or melena. Her past medical history significant for COPD for which she is on 4 L of oxygen at home, depression, GERD, hyperlipidemia, hypertension, osteoporosis, seizures, obesity, and cor pulmone. She has a history of GYN cancer in 1998 and is status post chemotherapy and radiation therapy.   Past Medical History  Diagnosis Date  . Allergy     Rhinitis  . Depression   . GERD (gastroesophageal reflux disease)     Barrett's esophagus  . Hyperlipidemia   . Hypertension   . Osteoporosis   . Seizures   . Barrett's esophagus 07/19/2004  . Anemia   . Obesity   . Pneumonia   . Stroke   . Sleep apnea   . Hepatitis   . Arthritis   . Blood transfusion   . COPD (chronic obstructive pulmonary disease)     wears 3 liters of o2 continious  . Emphysema of lung   . Ulcer   . Vaginal cancer   . Hiatal hernia     Past Surgical History  Procedure Laterality Date  . Orif hip  fracture Right   . Cholecystectomy    . Hemorrhoid surgery    . Total hip arthroplasty Right   . Abdominal hysterectomy    . 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 Abdominal laproscopy surgery     Family History  Problem Relation Age of Onset  . Heart disease Mother     MI  . Diabetes Mother   . Uterine cancer Mother   . Anemia Mother   . Hypertension Mother   . Stomach cancer Father   . Esophageal cancer Father   . Hypertension Father   . Diabetes Sister   . Anemia Sister   . Esophageal cancer Brother   . Hypertension Brother   . Diabetes Son   . Diabetes Son   . Brain cancer Brother   . Lung cancer Sister   . Liver cancer Sister   . Colon cancer Neg Hx   . Rectal cancer Neg Hx    History  Substance Use Topics  . Smoking status: Former Smoker -- 2.00 packs/day for 30 years    Types: Cigarettes    Quit date: 07/28/2002  . Smokeless tobacco: Never Used  . Alcohol Use: No   Current Outpatient Prescriptions  Medication Sig Dispense Refill  . albuterol (PROVENTIL) (2.5 MG/3ML) 0.083% nebulizer solution INHALE THE CONTENTS OF 1 VIAL VIA NEBULIZER 4 TIMES A DAY. 360 mL 12  . beclomethasone (QVAR) 80 MCG/ACT inhaler Inhale 2 puffs into the lungs 2 (two) times daily. 8.7 g 6  . clotrimazole-betamethasone (LOTRISONE) cream Apply 1 application topically 2 (two) times daily. 30 g 6  . conjugated estrogens (PREMARIN) vaginal cream Place 1 Applicatorful vaginally daily. 42.5 g 12  . CRESTOR 20 MG tablet TAKE 1 TABLET BY MOUTH EVERY OTHER DAY. 90 tablet 1  . escitalopram (LEXAPRO) 10 MG tablet Take 1 tablet (10 mg total) by mouth daily. 30 tablet 5  . Ferrous Sulfate Dried 200 (65 FE) MG TABS Take 200 mg by mouth daily. 30 tablet 6  . fluticasone (FLONASE) 50 MCG/ACT nasal spray Place 2 sprays into both nostrils daily. 16 g 5  . furosemide (LASIX) 40 MG tablet TAKE 1 TABLET (40 MG TOTAL) BY MOUTH DAILY. 30 tablet 5  .  ibandronate (BONIVA) 150 MG tablet Take in the morning with a full glass of water, on an empty stomach, 4 tablet 3  . ipratropium (ATROVENT) 0.02 % nebulizer solution Take 2.5 mLs (0.5 mg total) by nebulization 4 (four) times daily. 300 mL 12  . losartan (COZAAR) 25 MG tablet TAKE 1 TABLET BY MOUTH DAILY. 90 tablet 1  . omeprazole (PRILOSEC) 20 MG capsule TAKE 1 CAPSULE BY MOUTH ONCE DAILY. 30 capsule 6  . OXYGEN Inhale 4 L/min into the lungs continuous.    . polyethylene glycol powder (GLYCOLAX/MIRALAX) powder Take 17 g by mouth 2 (two) times daily as needed. 3350 g 1  . potassium chloride SA (K-DUR,KLOR-CON) 20 MEQ tablet TAKE 1 TABLET BY MOUTH DAILY. 30 tablet 5  . PROAIR HFA 108 (90 BASE) MCG/ACT inhaler INHALE 2 PUFFS INTO THE LUNGS EVERY 4 HOURS AS NEEDED FOR SHORTNESS OF BREATH 8.5 g 5  . sodium chloride (AYR) 0.65 % nasal spray Place 1 spray into the nose as needed for congestion.    . traZODone (DESYREL) 100 MG tablet TAKE 1 TABLET BY MOUTH AT BEDTIME. 90 tablet 1   No current facility-administered medications for this visit.   No Known Allergies   Review of Systems: Constitutional: Positive for fatigue. Negative for fever and unexpected weight change.  HENT: Positive for sneezing. Negative for congestion, dental problem, ear pain, mouth sores, nosebleeds, postnasal drip, rhinorrhea, sinus pressure, sore throat and trouble swallowing.  Eyes: Negative for redness and itching.  Respiratory: Positive for cough, chest tightness and shortness of breath. Negative for wheezing.  Cardiovascular: Positive for leg swelling. Negative for chest pain and palpitations.  GI: Admits to frequent heartburn, epigastric discomfort, nocturnal regurgitation. Genitourinary: Negative for dysuria.  Neurological: Negative for headaches.  Hematological: Bruises/bleeds easily.  Psychiatric/Behavioral: Negative for dysphoric mood.   Studies: Dg Chest 2 View  11/07/2014   CLINICAL DATA:  74 year old  female with severe COPD. Chronic respiratory failure with hypoxia. Subsequent encounter.  EXAM: CHEST  2 VIEW  COMPARISON:  07/20/2012 and earlier.  FINDINGS: Chronic moderate to large gastric hiatal hernia. Stable cardiac size and mediastinal contours. Calcified atherosclerosis of the aorta. Stable lung volumes. Stable coarse/interstitial pulmonary opacity in both lungs. No pneumothorax, pulmonary edema, pleural effusion or consolidation. No acute pulmonary opacity. Stable surgical clips in the abdomen. Osteopenia. No acute osseous abnormality identified.  IMPRESSION: Stable chronic lung disease and moderate to large hiatal hernia. No acute cardiopulmonary abnormality.   Electronically Signed  By: Genevie Ann M.D.   On: 11/07/2014 08:39     Prior Endoscopies:   EGD 10/23/2011 with Barrett's esophagus and a hiatal hernia at the gastroesophageal junction.  Physical Exam: BP 130/50 mmHg  Pulse 88  Ht 5\' 1"  (1.549 m)  Wt 192 lb 2 oz (87.147 kg)  BMI 36.32 kg/m2 Constitutional: Pleasant,well-developed, obese female in no acute distress in a wheelchair with nasal cannula oxygen. HEENT: Normocephalic and atraumatic. Conjunctivae are normal. No scleral icterus. Neck supple. No JVD. No thyromegaly. Cardiovascular: Normal rate, regular rhythm.  Pulmonary/chest: Effort normal and breath sounds normal. No wheezing, rales or rhonchi. Abdominal: Soft, nondistended, nontender. Bowel sounds active throughout. There are no masses palpable. No hepatomegaly. Extremities: no edema Lymphadenopathy: No cervical adenopathy noted. Neurological: Alert and oriented to person place and time. Skin: Skin is warm and dry. No rashes noted. Psychiatric: Normal mood and affect. Behavior is normal.  ASSESSMENT AND PLAN:  74 year old female with a history of GERD and Barrett's here to discuss surveillance endoscopy. Patient has been instructed to adhere to an antireflux regimen and to not eat for 2 hours before she retires in  the evening. She has been using her omeprazole at bedtime and was advised to use it first thing in the morning on an empty stomach instead, and to wait 30 minutes to she has her breakfast. She will be scheduled for an EGD to reevaluate her area of Barrett's as well as to evaluate for gastritis or ulcer.The risks, benefits, and alternatives to endoscopy with possible biopsy and possible dilation were discussed with the patient and they consent to proceed.    Calise Dunckel, Deloris Ping 12/04/2014, 3:57 PM  CC: Marletta Lor, MD

## 2014-12-22 ENCOUNTER — Encounter (HOSPITAL_COMMUNITY): Payer: Self-pay | Admitting: Gastroenterology

## 2014-12-22 DIAGNOSIS — I10 Essential (primary) hypertension: Secondary | ICD-10-CM | POA: Diagnosis not present

## 2014-12-22 NOTE — Anesthesia Postprocedure Evaluation (Signed)
  Anesthesia Post-op Note  Patient: Pamela Hampton  Procedure(s) Performed: Procedure(s): ESOPHAGOGASTRODUODENOSCOPY (EGD) WITH PROPOFOL (N/A)  Patient Location: PACU and Endoscopy Unit  Anesthesia Type:MAC  Level of Consciousness: awake  Airway and Oxygen Therapy: Patient Spontanous Breathing  Post-op Pain: mild  Post-op Assessment: Post-op Vital signs reviewed  Post-op Vital Signs: Reviewed  Last Vitals:  Filed Vitals:   12/21/14 1337  BP: 168/92  Pulse: 76  Temp:   Resp: 14    Complications: No apparent anesthesia complications

## 2014-12-23 DIAGNOSIS — I10 Essential (primary) hypertension: Secondary | ICD-10-CM | POA: Diagnosis not present

## 2014-12-23 DIAGNOSIS — J449 Chronic obstructive pulmonary disease, unspecified: Secondary | ICD-10-CM | POA: Diagnosis not present

## 2014-12-23 DIAGNOSIS — J9611 Chronic respiratory failure with hypoxia: Secondary | ICD-10-CM | POA: Diagnosis not present

## 2014-12-24 DIAGNOSIS — I10 Essential (primary) hypertension: Secondary | ICD-10-CM | POA: Diagnosis not present

## 2014-12-26 ENCOUNTER — Other Ambulatory Visit: Payer: Self-pay | Admitting: Internal Medicine

## 2014-12-26 DIAGNOSIS — I10 Essential (primary) hypertension: Secondary | ICD-10-CM | POA: Diagnosis not present

## 2014-12-27 DIAGNOSIS — I10 Essential (primary) hypertension: Secondary | ICD-10-CM | POA: Diagnosis not present

## 2014-12-28 ENCOUNTER — Encounter: Payer: Self-pay | Admitting: Gastroenterology

## 2014-12-28 ENCOUNTER — Other Ambulatory Visit: Payer: Self-pay | Admitting: Internal Medicine

## 2014-12-28 DIAGNOSIS — I10 Essential (primary) hypertension: Secondary | ICD-10-CM | POA: Diagnosis not present

## 2014-12-29 DIAGNOSIS — I10 Essential (primary) hypertension: Secondary | ICD-10-CM | POA: Diagnosis not present

## 2014-12-30 DIAGNOSIS — I10 Essential (primary) hypertension: Secondary | ICD-10-CM | POA: Diagnosis not present

## 2014-12-31 DIAGNOSIS — I10 Essential (primary) hypertension: Secondary | ICD-10-CM | POA: Diagnosis not present

## 2015-01-01 DIAGNOSIS — I10 Essential (primary) hypertension: Secondary | ICD-10-CM | POA: Diagnosis not present

## 2015-01-02 ENCOUNTER — Telehealth: Payer: Self-pay | Admitting: Internal Medicine

## 2015-01-02 DIAGNOSIS — I10 Essential (primary) hypertension: Secondary | ICD-10-CM | POA: Diagnosis not present

## 2015-01-02 MED ORDER — AZITHROMYCIN 250 MG PO TABS: 250.0000 mg | ORAL_TABLET | ORAL | Status: DC

## 2015-01-02 MED ORDER — PREDNISONE 10 MG PO TABS: ORAL_TABLET | ORAL | Status: DC

## 2015-01-02 NOTE — Telephone Encounter (Signed)
No Known Allergies  Ok for   ,You have d attack of copd called COPD exacerbation Also Zpak Please take prednisone 40mg  once daily x 3 days, then 20mg  once daily x 3 days, then 10mg  once daily x 3 days, then 5mg  once dailyx 3 days and stop

## 2015-01-02 NOTE — Telephone Encounter (Signed)
Spoke with pt- pt having increased SOB and chest congestion x 3 weeks. Denies cough and wheeze. Feels she is having a flare of her COPD. Pt requesting an Rx for Prednisone.   No Known Allergies  Please advise Dr Chase Caller. Thanks.

## 2015-01-02 NOTE — Telephone Encounter (Signed)
Spoke with patient, aware of recs per MR Rx's sent to pharmacy  Patient aware.  Nothing further needed.

## 2015-01-03 DIAGNOSIS — I10 Essential (primary) hypertension: Secondary | ICD-10-CM | POA: Diagnosis not present

## 2015-01-04 DIAGNOSIS — I10 Essential (primary) hypertension: Secondary | ICD-10-CM | POA: Diagnosis not present

## 2015-01-05 DIAGNOSIS — I10 Essential (primary) hypertension: Secondary | ICD-10-CM | POA: Diagnosis not present

## 2015-01-06 ENCOUNTER — Other Ambulatory Visit: Payer: Self-pay | Admitting: Internal Medicine

## 2015-01-06 DIAGNOSIS — I10 Essential (primary) hypertension: Secondary | ICD-10-CM | POA: Diagnosis not present

## 2015-01-07 DIAGNOSIS — I10 Essential (primary) hypertension: Secondary | ICD-10-CM | POA: Diagnosis not present

## 2015-01-08 DIAGNOSIS — I10 Essential (primary) hypertension: Secondary | ICD-10-CM | POA: Diagnosis not present

## 2015-01-09 DIAGNOSIS — I10 Essential (primary) hypertension: Secondary | ICD-10-CM | POA: Diagnosis not present

## 2015-01-10 DIAGNOSIS — J449 Chronic obstructive pulmonary disease, unspecified: Secondary | ICD-10-CM | POA: Diagnosis not present

## 2015-01-10 DIAGNOSIS — I10 Essential (primary) hypertension: Secondary | ICD-10-CM | POA: Diagnosis not present

## 2015-01-11 DIAGNOSIS — I10 Essential (primary) hypertension: Secondary | ICD-10-CM | POA: Diagnosis not present

## 2015-01-12 DIAGNOSIS — I10 Essential (primary) hypertension: Secondary | ICD-10-CM | POA: Diagnosis not present

## 2015-01-13 DIAGNOSIS — I10 Essential (primary) hypertension: Secondary | ICD-10-CM | POA: Diagnosis not present

## 2015-01-14 DIAGNOSIS — I10 Essential (primary) hypertension: Secondary | ICD-10-CM | POA: Diagnosis not present

## 2015-01-15 DIAGNOSIS — I10 Essential (primary) hypertension: Secondary | ICD-10-CM | POA: Diagnosis not present

## 2015-01-16 DIAGNOSIS — I10 Essential (primary) hypertension: Secondary | ICD-10-CM | POA: Diagnosis not present

## 2015-01-17 ENCOUNTER — Encounter: Payer: Self-pay | Admitting: Gastroenterology

## 2015-01-17 DIAGNOSIS — I10 Essential (primary) hypertension: Secondary | ICD-10-CM | POA: Diagnosis not present

## 2015-01-18 DIAGNOSIS — I10 Essential (primary) hypertension: Secondary | ICD-10-CM | POA: Diagnosis not present

## 2015-01-19 DIAGNOSIS — I10 Essential (primary) hypertension: Secondary | ICD-10-CM | POA: Diagnosis not present

## 2015-01-20 DIAGNOSIS — I10 Essential (primary) hypertension: Secondary | ICD-10-CM | POA: Diagnosis not present

## 2015-01-21 DIAGNOSIS — I10 Essential (primary) hypertension: Secondary | ICD-10-CM | POA: Diagnosis not present

## 2015-01-22 DIAGNOSIS — I10 Essential (primary) hypertension: Secondary | ICD-10-CM | POA: Diagnosis not present

## 2015-01-23 DIAGNOSIS — I10 Essential (primary) hypertension: Secondary | ICD-10-CM | POA: Diagnosis not present

## 2015-01-23 DIAGNOSIS — J9611 Chronic respiratory failure with hypoxia: Secondary | ICD-10-CM | POA: Diagnosis not present

## 2015-01-23 DIAGNOSIS — J449 Chronic obstructive pulmonary disease, unspecified: Secondary | ICD-10-CM | POA: Diagnosis not present

## 2015-01-24 DIAGNOSIS — I10 Essential (primary) hypertension: Secondary | ICD-10-CM | POA: Diagnosis not present

## 2015-01-25 DIAGNOSIS — I10 Essential (primary) hypertension: Secondary | ICD-10-CM | POA: Diagnosis not present

## 2015-01-26 DIAGNOSIS — I10 Essential (primary) hypertension: Secondary | ICD-10-CM | POA: Diagnosis not present

## 2015-01-27 DIAGNOSIS — I10 Essential (primary) hypertension: Secondary | ICD-10-CM | POA: Diagnosis not present

## 2015-01-28 DIAGNOSIS — I10 Essential (primary) hypertension: Secondary | ICD-10-CM | POA: Diagnosis not present

## 2015-01-30 DIAGNOSIS — I10 Essential (primary) hypertension: Secondary | ICD-10-CM | POA: Diagnosis not present

## 2015-01-31 DIAGNOSIS — I10 Essential (primary) hypertension: Secondary | ICD-10-CM | POA: Diagnosis not present

## 2015-02-01 DIAGNOSIS — I10 Essential (primary) hypertension: Secondary | ICD-10-CM | POA: Diagnosis not present

## 2015-02-02 DIAGNOSIS — I10 Essential (primary) hypertension: Secondary | ICD-10-CM | POA: Diagnosis not present

## 2015-02-03 DIAGNOSIS — I10 Essential (primary) hypertension: Secondary | ICD-10-CM | POA: Diagnosis not present

## 2015-02-04 DIAGNOSIS — I10 Essential (primary) hypertension: Secondary | ICD-10-CM | POA: Diagnosis not present

## 2015-02-05 DIAGNOSIS — I10 Essential (primary) hypertension: Secondary | ICD-10-CM | POA: Diagnosis not present

## 2015-02-06 DIAGNOSIS — I10 Essential (primary) hypertension: Secondary | ICD-10-CM | POA: Diagnosis not present

## 2015-02-07 DIAGNOSIS — I10 Essential (primary) hypertension: Secondary | ICD-10-CM | POA: Diagnosis not present

## 2015-02-08 DIAGNOSIS — I10 Essential (primary) hypertension: Secondary | ICD-10-CM | POA: Diagnosis not present

## 2015-02-09 DIAGNOSIS — J449 Chronic obstructive pulmonary disease, unspecified: Secondary | ICD-10-CM | POA: Diagnosis not present

## 2015-02-09 DIAGNOSIS — I10 Essential (primary) hypertension: Secondary | ICD-10-CM | POA: Diagnosis not present

## 2015-02-10 DIAGNOSIS — I10 Essential (primary) hypertension: Secondary | ICD-10-CM | POA: Diagnosis not present

## 2015-02-11 DIAGNOSIS — I10 Essential (primary) hypertension: Secondary | ICD-10-CM | POA: Diagnosis not present

## 2015-02-12 DIAGNOSIS — I10 Essential (primary) hypertension: Secondary | ICD-10-CM | POA: Diagnosis not present

## 2015-02-13 DIAGNOSIS — H3532 Exudative age-related macular degeneration: Secondary | ICD-10-CM | POA: Diagnosis not present

## 2015-02-13 DIAGNOSIS — H3531 Nonexudative age-related macular degeneration: Secondary | ICD-10-CM | POA: Diagnosis not present

## 2015-02-13 DIAGNOSIS — H43822 Vitreomacular adhesion, left eye: Secondary | ICD-10-CM | POA: Diagnosis not present

## 2015-02-13 DIAGNOSIS — H35433 Paving stone degeneration of retina, bilateral: Secondary | ICD-10-CM | POA: Diagnosis not present

## 2015-02-13 DIAGNOSIS — I10 Essential (primary) hypertension: Secondary | ICD-10-CM | POA: Diagnosis not present

## 2015-02-14 DIAGNOSIS — I10 Essential (primary) hypertension: Secondary | ICD-10-CM | POA: Diagnosis not present

## 2015-02-15 DIAGNOSIS — I10 Essential (primary) hypertension: Secondary | ICD-10-CM | POA: Diagnosis not present

## 2015-02-16 DIAGNOSIS — I10 Essential (primary) hypertension: Secondary | ICD-10-CM | POA: Diagnosis not present

## 2015-02-17 DIAGNOSIS — I10 Essential (primary) hypertension: Secondary | ICD-10-CM | POA: Diagnosis not present

## 2015-02-18 DIAGNOSIS — I10 Essential (primary) hypertension: Secondary | ICD-10-CM | POA: Diagnosis not present

## 2015-02-19 DIAGNOSIS — I10 Essential (primary) hypertension: Secondary | ICD-10-CM | POA: Diagnosis not present

## 2015-02-20 DIAGNOSIS — I10 Essential (primary) hypertension: Secondary | ICD-10-CM | POA: Diagnosis not present

## 2015-02-21 DIAGNOSIS — I10 Essential (primary) hypertension: Secondary | ICD-10-CM | POA: Diagnosis not present

## 2015-02-22 DIAGNOSIS — J449 Chronic obstructive pulmonary disease, unspecified: Secondary | ICD-10-CM | POA: Diagnosis not present

## 2015-02-22 DIAGNOSIS — J9611 Chronic respiratory failure with hypoxia: Secondary | ICD-10-CM | POA: Diagnosis not present

## 2015-02-22 DIAGNOSIS — I10 Essential (primary) hypertension: Secondary | ICD-10-CM | POA: Diagnosis not present

## 2015-02-24 DIAGNOSIS — I10 Essential (primary) hypertension: Secondary | ICD-10-CM | POA: Diagnosis not present

## 2015-02-25 DIAGNOSIS — I10 Essential (primary) hypertension: Secondary | ICD-10-CM | POA: Diagnosis not present

## 2015-02-26 ENCOUNTER — Telehealth: Payer: Self-pay | Admitting: Internal Medicine

## 2015-02-26 DIAGNOSIS — I10 Essential (primary) hypertension: Secondary | ICD-10-CM | POA: Diagnosis not present

## 2015-02-26 MED ORDER — PREDNISONE 10 MG PO TABS: ORAL_TABLET | ORAL | Status: DC

## 2015-02-26 NOTE — Telephone Encounter (Signed)
Spoke with pt. She is aware of VS's recommendation. Rx has been sent in. Nothing further was needed. 

## 2015-02-26 NOTE — Telephone Encounter (Signed)
Spoke with the pt  She is c/o chest congestion, cough with min clear sputum, and increased DOE for the past 3 days  She noticed some wheezing when she exerts herself  She denies any fever, CP, chest tightness, or other symptoms  She is requesting pred taper  There are no openings in the schedule this wk with any provider  Will forward to doc of the day since MR is on vacation  Please advise thanks!

## 2015-02-26 NOTE — Telephone Encounter (Signed)
Can send script for prednisone 10 mg pills >> 3 pills daily for 2 days, 2 pills daily for 2 days, 1 pill daily for 2 days, 1/2 pill daily for 2 days.  Dispense 13 pills with no refills.  She will need ROV if not better.

## 2015-02-27 DIAGNOSIS — I10 Essential (primary) hypertension: Secondary | ICD-10-CM | POA: Diagnosis not present

## 2015-02-28 DIAGNOSIS — I10 Essential (primary) hypertension: Secondary | ICD-10-CM | POA: Diagnosis not present

## 2015-03-01 DIAGNOSIS — I10 Essential (primary) hypertension: Secondary | ICD-10-CM | POA: Diagnosis not present

## 2015-03-02 DIAGNOSIS — I10 Essential (primary) hypertension: Secondary | ICD-10-CM | POA: Diagnosis not present

## 2015-03-03 DIAGNOSIS — I10 Essential (primary) hypertension: Secondary | ICD-10-CM | POA: Diagnosis not present

## 2015-03-04 DIAGNOSIS — I10 Essential (primary) hypertension: Secondary | ICD-10-CM | POA: Diagnosis not present

## 2015-03-05 DIAGNOSIS — I10 Essential (primary) hypertension: Secondary | ICD-10-CM | POA: Diagnosis not present

## 2015-03-06 DIAGNOSIS — I10 Essential (primary) hypertension: Secondary | ICD-10-CM | POA: Diagnosis not present

## 2015-03-07 DIAGNOSIS — I10 Essential (primary) hypertension: Secondary | ICD-10-CM | POA: Diagnosis not present

## 2015-03-08 DIAGNOSIS — I10 Essential (primary) hypertension: Secondary | ICD-10-CM | POA: Diagnosis not present

## 2015-03-09 DIAGNOSIS — I10 Essential (primary) hypertension: Secondary | ICD-10-CM | POA: Diagnosis not present

## 2015-03-10 DIAGNOSIS — I10 Essential (primary) hypertension: Secondary | ICD-10-CM | POA: Diagnosis not present

## 2015-03-11 DIAGNOSIS — I10 Essential (primary) hypertension: Secondary | ICD-10-CM | POA: Diagnosis not present

## 2015-03-12 DIAGNOSIS — I10 Essential (primary) hypertension: Secondary | ICD-10-CM | POA: Diagnosis not present

## 2015-03-12 DIAGNOSIS — J449 Chronic obstructive pulmonary disease, unspecified: Secondary | ICD-10-CM | POA: Diagnosis not present

## 2015-03-13 DIAGNOSIS — I10 Essential (primary) hypertension: Secondary | ICD-10-CM | POA: Diagnosis not present

## 2015-03-14 DIAGNOSIS — I10 Essential (primary) hypertension: Secondary | ICD-10-CM | POA: Diagnosis not present

## 2015-03-16 DIAGNOSIS — I10 Essential (primary) hypertension: Secondary | ICD-10-CM | POA: Diagnosis not present

## 2015-03-17 DIAGNOSIS — I10 Essential (primary) hypertension: Secondary | ICD-10-CM | POA: Diagnosis not present

## 2015-03-18 DIAGNOSIS — I10 Essential (primary) hypertension: Secondary | ICD-10-CM | POA: Diagnosis not present

## 2015-03-19 DIAGNOSIS — I10 Essential (primary) hypertension: Secondary | ICD-10-CM | POA: Diagnosis not present

## 2015-03-20 DIAGNOSIS — I10 Essential (primary) hypertension: Secondary | ICD-10-CM | POA: Diagnosis not present

## 2015-03-21 DIAGNOSIS — I10 Essential (primary) hypertension: Secondary | ICD-10-CM | POA: Diagnosis not present

## 2015-03-22 DIAGNOSIS — I10 Essential (primary) hypertension: Secondary | ICD-10-CM | POA: Diagnosis not present

## 2015-03-23 DIAGNOSIS — I10 Essential (primary) hypertension: Secondary | ICD-10-CM | POA: Diagnosis not present

## 2015-03-24 DIAGNOSIS — I10 Essential (primary) hypertension: Secondary | ICD-10-CM | POA: Diagnosis not present

## 2015-03-25 DIAGNOSIS — J9611 Chronic respiratory failure with hypoxia: Secondary | ICD-10-CM | POA: Diagnosis not present

## 2015-03-25 DIAGNOSIS — J449 Chronic obstructive pulmonary disease, unspecified: Secondary | ICD-10-CM | POA: Diagnosis not present

## 2015-03-25 DIAGNOSIS — I10 Essential (primary) hypertension: Secondary | ICD-10-CM | POA: Diagnosis not present

## 2015-03-26 DIAGNOSIS — I10 Essential (primary) hypertension: Secondary | ICD-10-CM | POA: Diagnosis not present

## 2015-03-27 DIAGNOSIS — I10 Essential (primary) hypertension: Secondary | ICD-10-CM | POA: Diagnosis not present

## 2015-03-28 ENCOUNTER — Other Ambulatory Visit: Payer: Self-pay | Admitting: Internal Medicine

## 2015-03-28 DIAGNOSIS — I10 Essential (primary) hypertension: Secondary | ICD-10-CM | POA: Diagnosis not present

## 2015-03-29 DIAGNOSIS — I10 Essential (primary) hypertension: Secondary | ICD-10-CM | POA: Diagnosis not present

## 2015-03-30 ENCOUNTER — Other Ambulatory Visit: Payer: Self-pay | Admitting: Internal Medicine

## 2015-03-30 DIAGNOSIS — I10 Essential (primary) hypertension: Secondary | ICD-10-CM | POA: Diagnosis not present

## 2015-03-31 DIAGNOSIS — I10 Essential (primary) hypertension: Secondary | ICD-10-CM | POA: Diagnosis not present

## 2015-04-01 DIAGNOSIS — I10 Essential (primary) hypertension: Secondary | ICD-10-CM | POA: Diagnosis not present

## 2015-04-03 DIAGNOSIS — I10 Essential (primary) hypertension: Secondary | ICD-10-CM | POA: Diagnosis not present

## 2015-04-04 DIAGNOSIS — I10 Essential (primary) hypertension: Secondary | ICD-10-CM | POA: Diagnosis not present

## 2015-04-05 DIAGNOSIS — I10 Essential (primary) hypertension: Secondary | ICD-10-CM | POA: Diagnosis not present

## 2015-04-06 DIAGNOSIS — I10 Essential (primary) hypertension: Secondary | ICD-10-CM | POA: Diagnosis not present

## 2015-04-07 DIAGNOSIS — I10 Essential (primary) hypertension: Secondary | ICD-10-CM | POA: Diagnosis not present

## 2015-04-08 DIAGNOSIS — I10 Essential (primary) hypertension: Secondary | ICD-10-CM | POA: Diagnosis not present

## 2015-04-09 DIAGNOSIS — I10 Essential (primary) hypertension: Secondary | ICD-10-CM | POA: Diagnosis not present

## 2015-04-10 DIAGNOSIS — I10 Essential (primary) hypertension: Secondary | ICD-10-CM | POA: Diagnosis not present

## 2015-04-11 DIAGNOSIS — I10 Essential (primary) hypertension: Secondary | ICD-10-CM | POA: Diagnosis not present

## 2015-04-12 DIAGNOSIS — I10 Essential (primary) hypertension: Secondary | ICD-10-CM | POA: Diagnosis not present

## 2015-04-12 DIAGNOSIS — J449 Chronic obstructive pulmonary disease, unspecified: Secondary | ICD-10-CM | POA: Diagnosis not present

## 2015-04-13 ENCOUNTER — Encounter: Payer: Self-pay | Admitting: Obstetrics and Gynecology

## 2015-04-13 DIAGNOSIS — I10 Essential (primary) hypertension: Secondary | ICD-10-CM | POA: Diagnosis not present

## 2015-04-14 DIAGNOSIS — I10 Essential (primary) hypertension: Secondary | ICD-10-CM | POA: Diagnosis not present

## 2015-04-15 DIAGNOSIS — I10 Essential (primary) hypertension: Secondary | ICD-10-CM | POA: Diagnosis not present

## 2015-04-16 DIAGNOSIS — I10 Essential (primary) hypertension: Secondary | ICD-10-CM | POA: Diagnosis not present

## 2015-04-17 DIAGNOSIS — I10 Essential (primary) hypertension: Secondary | ICD-10-CM | POA: Diagnosis not present

## 2015-04-18 DIAGNOSIS — I10 Essential (primary) hypertension: Secondary | ICD-10-CM | POA: Diagnosis not present

## 2015-04-19 DIAGNOSIS — I10 Essential (primary) hypertension: Secondary | ICD-10-CM | POA: Diagnosis not present

## 2015-04-20 DIAGNOSIS — I10 Essential (primary) hypertension: Secondary | ICD-10-CM | POA: Diagnosis not present

## 2015-04-21 DIAGNOSIS — I10 Essential (primary) hypertension: Secondary | ICD-10-CM | POA: Diagnosis not present

## 2015-04-22 DIAGNOSIS — I10 Essential (primary) hypertension: Secondary | ICD-10-CM | POA: Diagnosis not present

## 2015-04-23 DIAGNOSIS — I10 Essential (primary) hypertension: Secondary | ICD-10-CM | POA: Diagnosis not present

## 2015-04-24 ENCOUNTER — Ambulatory Visit (INDEPENDENT_AMBULATORY_CARE_PROVIDER_SITE_OTHER): Payer: Commercial Managed Care - HMO | Admitting: Internal Medicine

## 2015-04-24 ENCOUNTER — Encounter: Payer: Self-pay | Admitting: Internal Medicine

## 2015-04-24 VITALS — BP 160/82 | HR 80 | Temp 98.2°F | Resp 20 | Ht 61.0 in | Wt 193.0 lb

## 2015-04-24 DIAGNOSIS — J449 Chronic obstructive pulmonary disease, unspecified: Secondary | ICD-10-CM | POA: Diagnosis not present

## 2015-04-24 DIAGNOSIS — Z23 Encounter for immunization: Secondary | ICD-10-CM | POA: Diagnosis not present

## 2015-04-24 DIAGNOSIS — Q2730 Arteriovenous malformation, site unspecified: Secondary | ICD-10-CM | POA: Diagnosis not present

## 2015-04-24 DIAGNOSIS — I10 Essential (primary) hypertension: Secondary | ICD-10-CM | POA: Diagnosis not present

## 2015-04-24 DIAGNOSIS — D509 Iron deficiency anemia, unspecified: Secondary | ICD-10-CM | POA: Diagnosis not present

## 2015-04-24 LAB — CBC WITH DIFFERENTIAL/PLATELET
BASOS ABS: 0.1 10*3/uL (ref 0.0–0.1)
Basophils Relative: 0.6 % (ref 0.0–3.0)
Eosinophils Absolute: 0.2 10*3/uL (ref 0.0–0.7)
Eosinophils Relative: 2.5 % (ref 0.0–5.0)
HCT: 28.3 % — ABNORMAL LOW (ref 36.0–46.0)
LYMPHS PCT: 37.3 % (ref 12.0–46.0)
Lymphs Abs: 3.4 10*3/uL (ref 0.7–4.0)
MCHC: 30 g/dL (ref 30.0–36.0)
MCV: 80.9 fl (ref 78.0–100.0)
Monocytes Absolute: 0.8 10*3/uL (ref 0.1–1.0)
Monocytes Relative: 8.9 % (ref 3.0–12.0)
NEUTROS ABS: 4.7 10*3/uL (ref 1.4–7.7)
Neutrophils Relative %: 50.7 % (ref 43.0–77.0)
PLATELETS: 280 10*3/uL (ref 150.0–400.0)
RBC: 3.5 Mil/uL — ABNORMAL LOW (ref 3.87–5.11)
RDW: 20.1 % — ABNORMAL HIGH (ref 11.5–15.5)
WBC: 9.2 10*3/uL (ref 4.0–10.5)

## 2015-04-24 MED ORDER — FERROUS SULFATE DRIED 200 (65 FE) MG PO TABS: ORAL_TABLET | ORAL | Status: DC

## 2015-04-24 NOTE — Progress Notes (Signed)
Subjective:    Patient ID: Pamela Hampton, female    DOB: 07-03-1941, 74 y.o.   MRN: [id number]  HPI 74 year old patient who is seen today for her biannual follow-up.  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  She is followed by pulmonary medicine. 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  She also has a history of Barrett's esophagus and underwent follow-up upper endoscopy earlier this year.  There were no dysplastic changes.  She has essential hypertension which has been stable. She has occasional headaches.  Otherwise no change in her status.  She does take iron OTC twice daily  Past Medical History  Diagnosis Date  . Allergy     Rhinitis  . Depression   . GERD (gastroesophageal reflux disease)     Barrett's esophagus  . Hyperlipidemia   . Hypertension   . Osteoporosis   . Seizures   . Barrett's esophagus 07/19/2004  . Anemia   . Obesity   . Pneumonia   . Stroke   . Sleep apnea   . Hepatitis   . Arthritis   . Blood transfusion   . COPD (chronic obstructive pulmonary disease)     wears 3 liters of o2 continious  . Emphysema of lung   . Ulcer   . Hiatal hernia   . 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 radiation. chemo.  . Impaired hearing   . History of oxygen administration     oxygen concentrator @ 4 l/m nasally 24/ 7    Social History   Social History  . Marital Status: Divorced    Spouse Name: N/A  . Number of Children: 4  . Years of Education: N/A   Occupational History  . disaled    Social History Main Topics  . Smoking status: Former Smoker -- 2.00 packs/day for 30 years    Types: Cigarettes    Quit date: 07/28/2002  . Smokeless tobacco: Never Used  . Alcohol Use: No  . Drug Use: No  . Sexual Activity: Not Currently   Other Topics Concern  . Not on file   Social History Narrative    Past Surgical History  Procedure Laterality Date  . Orif hip fracture Right   . Cholecystectomy      . Hemorrhoid surgery    . Total hip arthroplasty Right   . Abdominal hysterectomy    . 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 Abdominal laproscopy surgery    . Joint replacement    . Esophagogastroduodenoscopy (egd) with propofol N/A 12/21/2014    Procedure: ESOPHAGOGASTRODUODENOSCOPY (EGD) WITH PROPOFOL;  Surgeon: Inda Castle, MD;  Location: [facility];  Service: Endoscopy;  Laterality: N/A;    Family History  Problem Relation Age of Onset  . Heart disease Mother     MI  . Diabetes Mother   . Uterine cancer Mother   . Anemia Mother   . Hypertension Mother   . Stomach cancer Father   . Esophageal cancer Father   . Hypertension Father   . Diabetes Sister   . Anemia Sister   . Esophageal cancer Brother   . Hypertension Brother   . Diabetes Son   . Diabetes Son   . Brain cancer Brother   . Lung cancer Sister   . Liver cancer Sister   . Colon cancer Neg Hx   .  Rectal cancer Neg Hx     No Known Allergies  Current Outpatient Prescriptions on File Prior to Visit  Medication Sig Dispense Refill  . albuterol (PROVENTIL) (2.5 MG/3ML) 0.083% nebulizer solution INHALE THE CONTENTS OF 1 VIAL VIA NEBULIZER 4 TIMES A DAY. 360 mL 12  . beclomethasone (QVAR) 80 MCG/ACT inhaler Inhale 2 puffs into the lungs 2 (two) times daily. 8.7 g 6  . clotrimazole-betamethasone (LOTRISONE) cream Apply 1 application topically 2 (two) times daily. 30 g 6  . conjugated estrogens (PREMARIN) vaginal cream Place 1 Applicatorful vaginally daily. 42.5 g 12  . CRESTOR 20 MG tablet TAKE 1 TABLET BY MOUTH EVERY OTHER DAY. 90 tablet 1  . escitalopram (LEXAPRO) 10 MG tablet TAKE 1 TABLET BY MOUTH DAILY. 30 tablet 5  . Ferrous Sulfate Dried 200 (65 FE) MG TABS Take 200 mg by mouth daily. 30 tablet 6  . fluticasone (FLONASE) 50 MCG/ACT nasal spray Place 2 sprays into both nostrils daily. (Patient taking differently: Place 2 sprays into both nostrils  daily as needed for allergies. ) 16 g 5  . furosemide (LASIX) 40 MG tablet TAKE 1 TABLET (40 MG TOTAL) BY MOUTH DAILY. (Patient taking differently: Take 40 mg by mouth daily as needed for fluid. ) 30 tablet 5  . ibandronate (BONIVA) 150 MG tablet Take in the morning with a full glass of water, on an empty stomach, (Patient taking differently: Take 150 mg by mouth every 30 (thirty) days. ) 4 tablet 3  . ipratropium (ATROVENT) 0.02 % nebulizer solution Take 2.5 mLs (0.5 mg total) by nebulization 4 (four) times daily. 300 mL 12  . losartan (COZAAR) 25 MG tablet TAKE 1 TABLET BY MOUTH DAILY. 90 tablet 1  . omeprazole (PRILOSEC) 20 MG capsule TAKE 1 CAPSULE BY MOUTH ONCE DAILY. 30 capsule 6  . OXYGEN Inhale 4 L/min into the lungs continuous.    . polyethylene glycol powder (GLYCOLAX/MIRALAX) powder Take 17 g by mouth 2 (two) times daily as needed. (Patient taking differently: Take 17 g by mouth daily. ) 3350 g 1  . potassium chloride SA (K-DUR,KLOR-CON) 20 MEQ tablet TAKE 1 TABLET BY MOUTH DAILY. 30 tablet 5  . PROAIR HFA 108 (90 BASE) MCG/ACT inhaler INHALE 2 PUFFS INTO THE LUNGS EVERY 4 HOURS AS NEEDED FOR SHORTNESS OF BREATH 8.5 g 5  . sodium chloride (AYR) 0.65 % nasal spray Place 1 spray into the nose as needed for congestion.    . traZODone (DESYREL) 100 MG tablet TAKE 1 TABLET BY MOUTH AT BEDTIME. 90 tablet 1   No current facility-administered medications on file prior to visit.    BP 160/82 mmHg  Pulse 80  Temp(Src) 98.2 F (36.8 C) (Oral)  Resp 20  Ht 5\' 1"  (1.549 m)  Wt 193 lb (87.544 kg)  BMI 36.49 kg/m2  SpO2 96%      Review of Systems  Constitutional: Negative.   HENT: Negative for congestion, dental problem, hearing loss, rhinorrhea, sinus pressure, sore throat and tinnitus.   Eyes: Negative for pain, discharge and visual disturbance.  Respiratory: Positive for shortness of breath. Negative for cough.   Cardiovascular: Positive for chest pain. Negative for palpitations and  leg swelling.  Gastrointestinal: Negative for nausea, vomiting, abdominal pain, diarrhea, constipation, blood in stool and abdominal distention.  Genitourinary: Negative for dysuria, urgency, frequency, hematuria, flank pain, vaginal bleeding, vaginal discharge, difficulty urinating, vaginal pain and pelvic pain.  Musculoskeletal: Positive for gait problem. Negative for joint swelling and arthralgias.  Skin:  Negative for rash.  Neurological: Negative for dizziness, syncope, speech difficulty, weakness, numbness and headaches.  Hematological: Negative for adenopathy.  Psychiatric/Behavioral: Negative for behavioral problems, dysphoric mood and agitation. The patient is not nervous/anxious.        Objective:   Physical Exam  Constitutional: She is oriented to person, place, and time. She appears well-developed and well-nourished.  Repeat blood pressure 140/72 Wheelchair bound Nasal cannula oxygen in place Weight 193 O2 saturation on 4 L 96%  HENT:  Head: Normocephalic.  Right Ear: External ear normal.  Left Ear: External ear normal.  Mouth/Throat: Oropharynx is clear and moist.  Eyes: Conjunctivae and EOM are normal. Pupils are equal, round, and reactive to light.  External strabismus, left eye  Neck: Normal range of motion. Neck supple. No thyromegaly present.  Cardiovascular: Normal rate, regular rhythm, normal heart sounds and intact distal pulses.   Pulmonary/Chest: Effort normal.  Diminished breath sounds but clear  Abdominal: Soft. Bowel sounds are normal. She exhibits no mass. There is no tenderness.  Musculoskeletal: Normal range of motion.  Lymphadenopathy:    She has no cervical adenopathy.  Neurological: She is alert and oriented to person, place, and time.  Skin: Skin is warm and dry. No rash noted.  Psychiatric: She has a normal mood and affect. Her behavior is normal.          Assessment & Plan:   Chronic anemia.  Will check a CBC.  Will continue iron therapy  twice daily Advanced COPD, stable.  Central hypertension, stable  Recheck 6 months Low-salt diet recommended Flu vaccine administered

## 2015-04-24 NOTE — Progress Notes (Signed)
Pre visit review using our clinic review tool, if applicable. No additional management support is needed unless otherwise documented below in the visit note.  Pt presented to office with oxygen on at 4L/min via nasal cannula.  

## 2015-04-24 NOTE — Patient Instructions (Signed)
Iron one tablet by mouth twice daily  Limit your sodium (Salt) intake  Return in 6 months for follow-up  Please check your blood pressure on a regular basis.  If it is consistently greater than 150/90, please make an office appointment.

## 2015-04-25 ENCOUNTER — Other Ambulatory Visit: Payer: Self-pay | Admitting: Internal Medicine

## 2015-04-25 DIAGNOSIS — D509 Iron deficiency anemia, unspecified: Secondary | ICD-10-CM

## 2015-04-25 DIAGNOSIS — J9611 Chronic respiratory failure with hypoxia: Secondary | ICD-10-CM | POA: Diagnosis not present

## 2015-04-25 DIAGNOSIS — J449 Chronic obstructive pulmonary disease, unspecified: Secondary | ICD-10-CM | POA: Diagnosis not present

## 2015-04-25 DIAGNOSIS — Q2730 Arteriovenous malformation, site unspecified: Secondary | ICD-10-CM

## 2015-04-25 DIAGNOSIS — I10 Essential (primary) hypertension: Secondary | ICD-10-CM | POA: Diagnosis not present

## 2015-04-26 ENCOUNTER — Other Ambulatory Visit: Payer: Self-pay | Admitting: Internal Medicine

## 2015-04-26 ENCOUNTER — Telehealth: Payer: Self-pay | Admitting: Internal Medicine

## 2015-04-26 DIAGNOSIS — I10 Essential (primary) hypertension: Secondary | ICD-10-CM | POA: Diagnosis not present

## 2015-04-26 MED ORDER — PREDNISONE 10 MG PO TABS: ORAL_TABLET | ORAL | Status: DC

## 2015-04-26 NOTE — Telephone Encounter (Signed)
Called spoke with pt. She is requesting prednisone. C/o prod cough (clear phlem), increase SOB d/t weather x 2 days. No wheezing, no chest tx. Please advise MR thanks

## 2015-04-26 NOTE — Telephone Encounter (Signed)
Please take prednisone 40 mg x1 day, then 30 mg x1 day, then 20 mg x1 day, then 10 mg x1 day, and then 5 mg x1 day and stop  If sputum turns color, then call back for abx  ER if worse

## 2015-04-26 NOTE — Telephone Encounter (Signed)
Spoke with the pt and notified of recs per MR  She verbalized understanding  Rx was sent to pharm  

## 2015-04-27 DIAGNOSIS — I10 Essential (primary) hypertension: Secondary | ICD-10-CM | POA: Diagnosis not present

## 2015-04-28 DIAGNOSIS — I10 Essential (primary) hypertension: Secondary | ICD-10-CM | POA: Diagnosis not present

## 2015-04-29 DIAGNOSIS — I10 Essential (primary) hypertension: Secondary | ICD-10-CM | POA: Diagnosis not present

## 2015-04-30 DIAGNOSIS — I10 Essential (primary) hypertension: Secondary | ICD-10-CM | POA: Diagnosis not present

## 2015-05-01 DIAGNOSIS — I10 Essential (primary) hypertension: Secondary | ICD-10-CM | POA: Diagnosis not present

## 2015-05-02 DIAGNOSIS — I10 Essential (primary) hypertension: Secondary | ICD-10-CM | POA: Diagnosis not present

## 2015-05-03 DIAGNOSIS — H353122 Nonexudative age-related macular degeneration, left eye, intermediate dry stage: Secondary | ICD-10-CM | POA: Diagnosis not present

## 2015-05-03 DIAGNOSIS — H353213 Exudative age-related macular degeneration, right eye, with inactive scar: Secondary | ICD-10-CM | POA: Diagnosis not present

## 2015-05-03 DIAGNOSIS — I10 Essential (primary) hypertension: Secondary | ICD-10-CM | POA: Diagnosis not present

## 2015-05-04 DIAGNOSIS — I10 Essential (primary) hypertension: Secondary | ICD-10-CM | POA: Diagnosis not present

## 2015-05-05 DIAGNOSIS — I10 Essential (primary) hypertension: Secondary | ICD-10-CM | POA: Diagnosis not present

## 2015-05-06 DIAGNOSIS — I10 Essential (primary) hypertension: Secondary | ICD-10-CM | POA: Diagnosis not present

## 2015-05-07 DIAGNOSIS — I10 Essential (primary) hypertension: Secondary | ICD-10-CM | POA: Diagnosis not present

## 2015-05-08 DIAGNOSIS — I10 Essential (primary) hypertension: Secondary | ICD-10-CM | POA: Diagnosis not present

## 2015-05-09 DIAGNOSIS — I10 Essential (primary) hypertension: Secondary | ICD-10-CM | POA: Diagnosis not present

## 2015-05-10 DIAGNOSIS — I10 Essential (primary) hypertension: Secondary | ICD-10-CM | POA: Diagnosis not present

## 2015-05-11 DIAGNOSIS — I10 Essential (primary) hypertension: Secondary | ICD-10-CM | POA: Diagnosis not present

## 2015-05-12 DIAGNOSIS — I10 Essential (primary) hypertension: Secondary | ICD-10-CM | POA: Diagnosis not present

## 2015-05-12 DIAGNOSIS — J449 Chronic obstructive pulmonary disease, unspecified: Secondary | ICD-10-CM | POA: Diagnosis not present

## 2015-05-13 DIAGNOSIS — I10 Essential (primary) hypertension: Secondary | ICD-10-CM | POA: Diagnosis not present

## 2015-05-14 ENCOUNTER — Other Ambulatory Visit: Payer: Self-pay | Admitting: Internal Medicine

## 2015-05-14 DIAGNOSIS — I10 Essential (primary) hypertension: Secondary | ICD-10-CM | POA: Diagnosis not present

## 2015-05-15 DIAGNOSIS — I10 Essential (primary) hypertension: Secondary | ICD-10-CM | POA: Diagnosis not present

## 2015-05-16 DIAGNOSIS — I10 Essential (primary) hypertension: Secondary | ICD-10-CM | POA: Diagnosis not present

## 2015-05-17 DIAGNOSIS — H353213 Exudative age-related macular degeneration, right eye, with inactive scar: Secondary | ICD-10-CM | POA: Diagnosis not present

## 2015-05-17 DIAGNOSIS — H353122 Nonexudative age-related macular degeneration, left eye, intermediate dry stage: Secondary | ICD-10-CM | POA: Diagnosis not present

## 2015-05-17 DIAGNOSIS — I10 Essential (primary) hypertension: Secondary | ICD-10-CM | POA: Diagnosis not present

## 2015-05-18 DIAGNOSIS — I10 Essential (primary) hypertension: Secondary | ICD-10-CM | POA: Diagnosis not present

## 2015-05-21 DIAGNOSIS — I10 Essential (primary) hypertension: Secondary | ICD-10-CM | POA: Diagnosis not present

## 2015-05-22 DIAGNOSIS — I10 Essential (primary) hypertension: Secondary | ICD-10-CM | POA: Diagnosis not present

## 2015-05-23 DIAGNOSIS — I10 Essential (primary) hypertension: Secondary | ICD-10-CM | POA: Diagnosis not present

## 2015-05-24 DIAGNOSIS — I10 Essential (primary) hypertension: Secondary | ICD-10-CM | POA: Diagnosis not present

## 2015-05-25 DIAGNOSIS — I10 Essential (primary) hypertension: Secondary | ICD-10-CM | POA: Diagnosis not present

## 2015-05-25 DIAGNOSIS — J9611 Chronic respiratory failure with hypoxia: Secondary | ICD-10-CM | POA: Diagnosis not present

## 2015-05-25 DIAGNOSIS — J449 Chronic obstructive pulmonary disease, unspecified: Secondary | ICD-10-CM | POA: Diagnosis not present

## 2015-05-26 DIAGNOSIS — I10 Essential (primary) hypertension: Secondary | ICD-10-CM | POA: Diagnosis not present

## 2015-05-27 DIAGNOSIS — I10 Essential (primary) hypertension: Secondary | ICD-10-CM | POA: Diagnosis not present

## 2015-05-28 ENCOUNTER — Other Ambulatory Visit: Payer: Self-pay | Admitting: Internal Medicine

## 2015-06-07 ENCOUNTER — Ambulatory Visit (INDEPENDENT_AMBULATORY_CARE_PROVIDER_SITE_OTHER): Payer: Commercial Managed Care - HMO | Admitting: Internal Medicine

## 2015-06-07 ENCOUNTER — Encounter: Payer: Self-pay | Admitting: Internal Medicine

## 2015-06-07 VITALS — BP 144/54 | HR 83 | Ht 61.0 in

## 2015-06-07 DIAGNOSIS — J018 Other acute sinusitis: Secondary | ICD-10-CM | POA: Diagnosis not present

## 2015-06-07 DIAGNOSIS — J449 Chronic obstructive pulmonary disease, unspecified: Secondary | ICD-10-CM | POA: Diagnosis not present

## 2015-06-07 DIAGNOSIS — J9611 Chronic respiratory failure with hypoxia: Secondary | ICD-10-CM | POA: Diagnosis not present

## 2015-06-07 MED ORDER — CEPHALEXIN 500 MG PO CAPS: 500.0000 mg | ORAL_CAPSULE | Freq: Three times a day (TID) | ORAL | Status: DC

## 2015-06-07 NOTE — Progress Notes (Signed)
Subjective:     Patient ID: Pamela Hampton, female   DOB: 02-Sep-1940, 74 y.o.   MRN: [id number]  HPI   PCP Nyoka Cowden, MD  # 60 pack smoker quit 2004.   # Frequent UTI. Gyn cancer 1998  -s/p chemo Rx and. xrt = strong family hx of cancer  #lung imagin  - Feb 2013: No evicende of cancer on CT Chest - dec 2013 0 clear cxrf  #Obesity  - >  30# weight gain since 2010-2012  - Body mass index is 34.86 kg/(m^2).   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 Ratio 51 and DLCO 6.7/30\ Status post pulmonary rehabilitation summer 2013   #AECOPD - September 2013 treated in the office with doxycycline and prednisone on 04/14/2012 - December 2013 4 emergency room treatment with prednisone and antibiotics - July 2014 office treatment with Levaquin and prednisone - Nov 2014: office Rx - March 2015: office telpehone Rx - doxy/[pred - July 2015 - telephone RX pred alone - heat related aecopd - dec 2015 - telephone Rx with pred alone  ...................... OV 03/13/2014  Chief Complaint  Patient presents with  . Follow-up    Pt states her breathing is unchanged. Pt c/o DOE, prod cough with green and white. pt states her cough is worse in monring and when outdoors. Pt states when she becomes very dyspneic then she will experience mid sternal  CP. Pt states she received 2 units of blood in 01/2014 d/t hgb at 7.5   FU COPD with cor pulmonale  - doing well. NO admissions since nov/dec 2014 when last seen. IN [facility] 2015: Rx over phone for aecopd. Continues qvar and duoneb without a problem. On 4L o2.   Past hx: has chronic anemia and 4 wweeks ago PCP Nyoka Cowden, MD gave 2 units prbc for hgg 7s. Feels better after that. Also reports chronic nasal stuffiness  and head cold and wants ENT referral   OV 11/06/2014  Chief Complaint  Patient presents with  . Follow-up    Pt c/o prod cough with yellow mucus, SOB with activity. Right sided chest tightness occasionally. Denies any chest congestion, n/v/d. Wants to talk about going back to symbicort.   FU COPD with cor pulmonarle  - last seen August 2015. Since that she called in once in December 2015 for COPD exacerbation and received prednisone alonefor an exacerbation. She is on duo neb and Qvar and according to her and her female companion who is with her today she is fairly compliant with this. 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 She got some injection into the eye and since then she's had partial recovery of vision. Despite this she is able to see and uses inhalers   Labs Jan 2016 hgb 9.5gm% which appears stable   OV 06/07/2015  Chief Complaint  Patient presents with  . Follow-up    Pt c/o sinus congestion, PND, prod cough with brown mucus. Pt states her SOB is at baseline. Pt denies CP/tightness.    Follow-up COPD with chronic hypoxemia and  respiratory failure and cor pulmonale  - Last seen in April 2016. Since then his called in for an exacerbation. Currently she presents with a female companion. She is on chronic 4 L oxygen. She is on duo neb and Qvar. They both report compliance. She reports good stable health. For the last week she's having increased sinus drainage that is dirty brown in color. She does not know her baseline color but she does note that this is a change from her baseline. There is no fever or worsening cough or wheezing or chest tightness. She otherwise feels good in terms of energy level. This no hemoptysis. This no edema getting worse.  Chest x-ray April 2016 reported to be clear other than  large hiatal hernia and COPD. Not personally visualized   CAT COPD Symptom & Quality of Life Score ([place]) 0 is no burden. 5 is highest burden 04/14/2012  08/03/2012  02/14/2013  06/10/2013   Never Cough -> Cough all the time 1 4, baseline 2 4  No phlegm in chest -> Chest is full of phlegm 5 3, baseline 5 4  No chest tightness -> Chest feels very tight 1 0, /imrpoved 4 4  No dyspnea for 1 flight stairs/hill -> Very dyspneic for 1 flight of stairs 5 5, "improved" 5 5  No limitations for ADL at home -> Very limited with ADL at home 3 5 5 5   Confident leaving home -> Not at all confident leaving home 0 2 3 4   Sleep soundly -> Do not sleep soundly because of lung condition 3 4 4 5   Lots of Energy -> No energy at all 3 5 5 5   TOTAL Score (max 40)  21 28 32 36      Current outpatient prescriptions:  .  albuterol (PROVENTIL) (2.5 MG/3ML) 0.083% nebulizer solution, INHALE THE CONTENTS OF 1 VIAL VIA NEBULIZER 4 TIMES A DAY., Disp: 360 mL, Rfl: 12 .  beclomethasone (QVAR) 80 MCG/ACT inhaler, Inhale 2 puffs into the lungs 2 (two) times daily., Disp: 8.7 g, Rfl: 6 .  clotrimazole-betamethasone (LOTRISONE) cream, Apply 1 application topically 2 (two) times daily., Disp: 30 g, Rfl: 6 .  conjugated estrogens (PREMARIN) vaginal cream, Place 1 Applicatorful vaginally daily., Disp: 42.5 g, Rfl: 12 .  CRESTOR 20 MG tablet, TAKE 1 TABLET BY MOUTH EVERY OTHER DAY., Disp: 90 tablet, Rfl: 1 .  escitalopram (LEXAPRO) 10 MG tablet, TAKE 1 TABLET BY MOUTH DAILY., Disp: 30 tablet, Rfl: 5 .  Ferrous Sulfate Dried 200 (65 FE) MG TABS, 1 tablet by mouth twice daily, Disp: 30 tablet, Rfl: 6 .  fluticasone (FLONASE) 50 MCG/ACT nasal spray, Place 2 sprays into both nostrils daily. (Patient taking differently: Place 2 sprays into both nostrils daily as needed for allergies. ), Disp: 16 g, Rfl: 5 .  furosemide (LASIX) 40 MG tablet, TAKE 1 TABLET (40 MG TOTAL) BY MOUTH DAILY. (Patient taking differently: Take 40 mg  by mouth daily as needed for fluid. ), Disp: 30 tablet, Rfl: 5 .  ibandronate (BONIVA) 150 MG tablet, TAKE IN THE MORNING WITH A FULL GLASS OF WATER, ON AN EMPTY STOMACH ONCE MONTHLY., Disp: 4 tablet, Rfl: 3 .  ipratropium (ATROVENT) 0.02 % nebulizer solution, Take 2.5 mLs (0.5 mg total) by nebulization 4 (four) times daily., Disp: 300 mL, Rfl: 12 .  losartan (COZAAR) 25 MG tablet, TAKE 1 TABLET BY MOUTH DAILY., Disp: 90 tablet, Rfl: 1 .  omeprazole (PRILOSEC) 20 MG capsule, TAKE 1 CAPSULE BY MOUTH  ONCE DAILY., Disp: 30 capsule, Rfl: 6 .  OXYGEN, Inhale 4 L/min into the lungs continuous., Disp: , Rfl:  .  polyethylene glycol powder (GLYCOLAX/MIRALAX) powder, Take 17 g by mouth 2 (two) times daily as needed. (Patient taking differently: Take 17 g by mouth daily. ), Disp: 3350 g, Rfl: 1 .  potassium chloride SA (K-DUR,KLOR-CON) 20 MEQ tablet, TAKE 1 TABLET BY MOUTH DAILY., Disp: 30 tablet, Rfl: 5 .  PROAIR HFA 108 (90 BASE) MCG/ACT inhaler, INHALE 2 PUFFS INTO THE LUNGS EVERY 4 HOURS AS NEEDED FOR SHORTNESS OF BREATH, Disp: 8.5 g, Rfl: 5 .  sodium chloride (AYR) 0.65 % nasal spray, Place 1 spray into the nose as needed for congestion., Disp: , Rfl:  .  traZODone (DESYREL) 100 MG tablet, TAKE 1 TABLET BY MOUTH AT BEDTIME., Disp: 90 tablet, Rfl: 1    Review of Systems     Objective:   Physical Exam  Constitutional: She is oriented to person, place, and time. She appears well-developed and well-nourished. No distress.  Obese  HENT:  Head: Normocephalic and atraumatic.  Right Ear: External ear normal.  Left Ear: External ear normal.  Mouth/Throat: Oropharynx is clear and moist. No oropharyngeal exudate.  Postnasal drip present  Eyes: Conjunctivae and EOM are normal. Pupils are equal, round, and reactive to light. Right eye exhibits no discharge. Left eye exhibits no discharge. No scleral icterus.  Neck: Normal range of motion. Neck supple. No JVD present. No tracheal deviation present. No  thyromegaly present.  Cardiovascular: Normal rate, regular rhythm, normal heart sounds and intact distal pulses.  Exam reveals no gallop and no friction rub.   No murmur heard. Pulmonary/Chest: Effort normal and breath sounds normal. No respiratory distress. She has no wheezes. She has no rales. She exhibits no tenderness.  Abdominal: Soft. Bowel sounds are normal. She exhibits no distension and no mass. There is no tenderness. There is no rebound and no guarding.  Musculoskeletal: Normal range of motion. She exhibits edema. She exhibits no tenderness.  Sits in wheel chair Chronic 1+ edema  Lymphadenopathy:    She has no cervical adenopathy.  Neurological: She is alert and oriented to person, place, and time. She has normal reflexes. No cranial nerve deficit. She exhibits normal muscle tone. Coordination normal.  Skin: Skin is warm and dry. No rash noted. She is not diaphoretic. No erythema. No pallor.  Psychiatric: She has a normal mood and affect. Her behavior is normal. Judgment and thought content normal.  Vitals reviewed.   Filed Vitals:   06/07/15 1438  BP: 144/54  Pulse: 83  Height: 5\' 1"  (1.549 m)  SpO2: 96%         Assessment:       ICD-9-CM ICD-10-CM   1. COPD, severe ([place]) 496 J44.9   2. Chronic respiratory failure with hypoxia (HCC) 518.83 J96.11    799.02    3. Other acute sinusitis 461.8 J01.80        Plan:      #COPD with chronic respiratory failure and cor pulmonale   Stable disease  Continue Albuterol and Ipratropium Neb Four times a day   Continue on QVAR 2 puffs Twice daily  , rinse after use.  Continue on Oxygen 4l/m .  follow up Dr. Chase Caller in 6 months and As needed   Please contact office for sooner follow up if symptoms do not improve or worsen or seek emergency care  #Sinusitis  - Cephalexin 500 mg 3 times a day 7  days - If this is not getting better call us back soon   #Followup  6 months or sooner if needed ; wil discuss lung cancer  screen at followup

## 2015-06-07 NOTE — Patient Instructions (Addendum)
ICD-9-CM ICD-10-CM   1. COPD, severe ([place]) 496 J44.9   2. Chronic respiratory failure with hypoxia (HCC) 518.83 J96.11    799.02    3. Other acute sinusitis 461.8 J01.80      #COPD with chronic respiratory failure and cor pulmonale   Stable disease  Continue Albuterol and Ipratropium Neb Four times a day   Continue on QVAR 2 puffs Twice daily  , rinse after use.  Continue on Oxygen 4l/m .  follow up Dr. Chase Caller in 6 months and As needed   Please contact office for sooner follow up if symptoms do not improve or worsen or seek emergency care  #Sinusitis  - Cephalexin 500 mg 3 times a day 7 days - If this is not getting better call us back soon   #Followup  6 months or sooner if needed ; wil discuss lung cancer screen at followup

## 2015-06-12 DIAGNOSIS — H43822 Vitreomacular adhesion, left eye: Secondary | ICD-10-CM | POA: Diagnosis not present

## 2015-06-12 DIAGNOSIS — H353122 Nonexudative age-related macular degeneration, left eye, intermediate dry stage: Secondary | ICD-10-CM | POA: Diagnosis not present

## 2015-06-12 DIAGNOSIS — H353213 Exudative age-related macular degeneration, right eye, with inactive scar: Secondary | ICD-10-CM | POA: Diagnosis not present

## 2015-06-12 DIAGNOSIS — J449 Chronic obstructive pulmonary disease, unspecified: Secondary | ICD-10-CM | POA: Diagnosis not present

## 2015-06-15 DIAGNOSIS — H524 Presbyopia: Secondary | ICD-10-CM | POA: Diagnosis not present

## 2015-06-15 DIAGNOSIS — H5210 Myopia, unspecified eye: Secondary | ICD-10-CM | POA: Diagnosis not present

## 2015-06-25 DIAGNOSIS — J449 Chronic obstructive pulmonary disease, unspecified: Secondary | ICD-10-CM | POA: Diagnosis not present

## 2015-06-25 DIAGNOSIS — I10 Essential (primary) hypertension: Secondary | ICD-10-CM | POA: Diagnosis not present

## 2015-06-25 DIAGNOSIS — J9611 Chronic respiratory failure with hypoxia: Secondary | ICD-10-CM | POA: Diagnosis not present

## 2015-07-04 ENCOUNTER — Telehealth: Payer: Self-pay | Admitting: Internal Medicine

## 2015-07-04 NOTE — Telephone Encounter (Signed)
Richardson Landry from [employer] called saying the paperwork for Ms. Roebuck's incontinence supplies were incorrectly filled out for a second time. He'll send the paperwork again but it's imperative that Dr. Raliegh Ip fills out the form completely in addition to signing and dating it. The Rx is fine but the "[place] CMM Form" needs to be filled out completely. If you have any questions or concerns, please feel free to contact him.  [facility] ph# [phone] Thank you.

## 2015-07-06 ENCOUNTER — Other Ambulatory Visit: Payer: Self-pay | Admitting: Internal Medicine

## 2015-07-06 NOTE — Telephone Encounter (Signed)
Awaiting paperwork

## 2015-07-12 DIAGNOSIS — J449 Chronic obstructive pulmonary disease, unspecified: Secondary | ICD-10-CM | POA: Diagnosis not present

## 2015-07-25 DIAGNOSIS — J9611 Chronic respiratory failure with hypoxia: Secondary | ICD-10-CM | POA: Diagnosis not present

## 2015-07-25 DIAGNOSIS — I10 Essential (primary) hypertension: Secondary | ICD-10-CM | POA: Diagnosis not present

## 2015-07-25 DIAGNOSIS — J449 Chronic obstructive pulmonary disease, unspecified: Secondary | ICD-10-CM | POA: Diagnosis not present

## 2015-08-12 DIAGNOSIS — J449 Chronic obstructive pulmonary disease, unspecified: Secondary | ICD-10-CM | POA: Diagnosis not present

## 2015-08-13 ENCOUNTER — Other Ambulatory Visit: Payer: Self-pay | Admitting: Internal Medicine

## 2015-08-25 DIAGNOSIS — I10 Essential (primary) hypertension: Secondary | ICD-10-CM | POA: Diagnosis not present

## 2015-08-25 DIAGNOSIS — J9611 Chronic respiratory failure with hypoxia: Secondary | ICD-10-CM | POA: Diagnosis not present

## 2015-08-25 DIAGNOSIS — J449 Chronic obstructive pulmonary disease, unspecified: Secondary | ICD-10-CM | POA: Diagnosis not present

## 2015-08-28 ENCOUNTER — Telehealth: Payer: Self-pay | Admitting: Internal Medicine

## 2015-08-28 DIAGNOSIS — Z0100 Encounter for examination of eyes and vision without abnormal findings: Secondary | ICD-10-CM

## 2015-08-28 NOTE — Telephone Encounter (Signed)
Patient needs a referral for the eye doctor, Dr. Dorann Ou

## 2015-08-29 NOTE — Telephone Encounter (Signed)
ERROR

## 2015-08-29 NOTE — Telephone Encounter (Signed)
Left message on voicemail order for referral was done and someone will be contacting you regarding an appt. Any questions please call the office.

## 2015-09-04 ENCOUNTER — Telehealth: Payer: Self-pay | Admitting: Internal Medicine

## 2015-09-04 MED ORDER — PREDNISONE 10 MG PO TABS: ORAL_TABLET | ORAL | Status: DC

## 2015-09-04 MED ORDER — CEPHALEXIN 500 MG PO CAPS: 500.0000 mg | ORAL_CAPSULE | Freq: Three times a day (TID) | ORAL | Status: DC

## 2015-09-04 NOTE — Telephone Encounter (Signed)
Called spoke with pt. Aware of recs. RX's sent in. Nothing further needed 

## 2015-09-04 NOTE — Telephone Encounter (Signed)
Called spoke with patient who c/o increased SOB x2 days, prod cough with 'murky' sputum and a slight wheeze.  She denies any tightness, f/c/s, hemoptysis.  Pt is requesting prednisone.  MR please advise, thank you.  Last ov 11.10.16 w/ MR: Patient Instructions           ICD-9-CM  ICD-10-CM     1.  COPD, severe ([place])  496  J44.9     2.  Chronic respiratory failure with hypoxia (HCC)  518.83  J96.11       799.02       3.  Other acute sinusitis  461.8  J01.80        #COPD with chronic respiratory failure and cor pulmonale   Stable disease   Continue Albuterol and Ipratropium Neb Four times a day    Continue on QVAR 2 puffs Twice daily  , rinse after use.   Continue on Oxygen 4l/m .   follow up Dr. Chase Caller in 6 months and As needed    Please contact office for sooner follow up if symptoms do not improve or worsen or seek emergency care  #Sinusitis  - Cephalexin 500 mg 3 times a day 7 days - If this is not getting better call us back soon   #Followup  6 months or sooner if needed ; wil discuss lung cancer screen at followup

## 2015-09-04 NOTE — Telephone Encounter (Signed)
Take prednisone 40 mg daily x 2 days, then 20mg  daily x 2 days, then 10mg  daily x 2 days, then 5mg  daily x 2 days and stop  Cephalexin 500mg  tid x 5 days  For AECOPD

## 2015-09-12 ENCOUNTER — Telehealth: Payer: Self-pay | Admitting: Internal Medicine

## 2015-09-12 DIAGNOSIS — J449 Chronic obstructive pulmonary disease, unspecified: Secondary | ICD-10-CM

## 2015-09-12 NOTE — Telephone Encounter (Signed)
Called spoke with pt son in law [place]. He is calling bc pt neb machine has stopped working and needs order sent to [facility]. I have placed order. Nothing further needed

## 2015-09-25 DIAGNOSIS — J449 Chronic obstructive pulmonary disease, unspecified: Secondary | ICD-10-CM | POA: Diagnosis not present

## 2015-09-25 DIAGNOSIS — I10 Essential (primary) hypertension: Secondary | ICD-10-CM | POA: Diagnosis not present

## 2015-09-25 DIAGNOSIS — J9611 Chronic respiratory failure with hypoxia: Secondary | ICD-10-CM | POA: Diagnosis not present

## 2015-09-26 DIAGNOSIS — J449 Chronic obstructive pulmonary disease, unspecified: Secondary | ICD-10-CM | POA: Diagnosis not present

## 2015-10-10 DIAGNOSIS — J449 Chronic obstructive pulmonary disease, unspecified: Secondary | ICD-10-CM | POA: Diagnosis not present

## 2015-10-18 ENCOUNTER — Telehealth: Payer: Self-pay | Admitting: Internal Medicine

## 2015-10-18 MED ORDER — PREDNISONE 10 MG PO TABS: ORAL_TABLET | ORAL | Status: DC

## 2015-10-18 NOTE — Telephone Encounter (Signed)
Prednisone 10 mg, # 20, 4 X 2 DAYS, 3 X 2 DAYS, 2 X 2 DAYS, 1 X 2 DAYS  

## 2015-10-18 NOTE — Telephone Encounter (Signed)
Spoke with pt, c/o increased sob, sneezing, runny nose. Normal "allergy symptoms" per pt.  S/s present X2-3 days.   Denies discolored mucus, chills. Requesting prednisone taper. Pt uses [facility].    Sending to DOD.  CY please advise on recs.  Thanks!   No Known Allergies Current Outpatient Prescriptions on File Prior to Visit  Medication Sig Dispense Refill  . albuterol (PROVENTIL) (2.5 MG/3ML) 0.083% nebulizer solution INHALE THE CONTENTS OF 1 VIAL VIA NEBULIZER 4 TIMES A DAY. 360 mL 12  . beclomethasone (QVAR) 80 MCG/ACT inhaler Inhale 2 puffs into the lungs 2 (two) times daily. 8.7 g 6  . cephALEXin (KEFLEX) 500 MG capsule Take 1 capsule (500 mg total) by mouth 3 (three) times daily. 15 capsule 0  . clotrimazole-betamethasone (LOTRISONE) cream Apply 1 application topically 2 (two) times daily. 30 g 6  . conjugated estrogens (PREMARIN) vaginal cream Place 1 Applicatorful vaginally daily. 42.5 g 12  . CRESTOR 20 MG tablet TAKE 1 TABLET BY MOUTH EVERY OTHER DAY. 90 tablet 1  . escitalopram (LEXAPRO) 10 MG tablet TAKE 1 TABLET BY MOUTH DAILY. 30 tablet 5  . Ferrous Sulfate Dried 200 (65 FE) MG TABS 1 tablet by mouth twice daily 30 tablet 6  . fluticasone (FLONASE) 50 MCG/ACT nasal spray PLACE 2 SPRAYS INTO BOTH NOSTRILS DAILY. 16 g 5  . furosemide (LASIX) 40 MG tablet TAKE 1 TABLET (40 MG TOTAL) BY MOUTH DAILY. (Patient taking differently: Take 40 mg by mouth daily as needed for fluid. ) 30 tablet 5  . ibandronate (BONIVA) 150 MG tablet TAKE IN THE MORNING WITH A FULL GLASS OF WATER, ON AN EMPTY STOMACH ONCE MONTHLY. 4 tablet 3  . ipratropium (ATROVENT) 0.02 % nebulizer solution INHALE THE CONTENTS OF 1 VIAL VIA NEBULIZER 4 TIMES A DAY. 300 mL 6  . losartan (COZAAR) 25 MG tablet TAKE 1 TABLET BY MOUTH DAILY. 90 tablet 1  . omeprazole (PRILOSEC) 20 MG capsule TAKE 1 CAPSULE BY MOUTH ONCE DAILY. 30 capsule 6  . OXYGEN Inhale 4 L/min into the lungs continuous.    . polyethylene glycol  powder (GLYCOLAX/MIRALAX) powder Take 17 g by mouth 2 (two) times daily as needed. (Patient taking differently: Take 17 g by mouth daily. ) 3350 g 1  . potassium chloride SA (K-DUR,KLOR-CON) 20 MEQ tablet TAKE 1 TABLET BY MOUTH DAILY. 30 tablet 5  . predniSONE (DELTASONE) 10 MG tablet 40 mg daily x 2 days, then 20mg  daily x 2 days, then 10mg  daily x 2 days, then 5mg  daily x 2 days and stop 15 tablet 0  . PROAIR HFA 108 (90 BASE) MCG/ACT inhaler INHALE 2 PUFFS INTO THE LUNGS EVERY 4 HOURS AS NEEDED FOR SHORTNESS OF BREATH 8.5 g 5  . sodium chloride (AYR) 0.65 % nasal spray Place 1 spray into the nose as needed for congestion.    . traZODone (DESYREL) 100 MG tablet TAKE 1 TABLET BY MOUTH AT BEDTIME. 90 tablet 1   No current facility-administered medications on file prior to visit.

## 2015-10-18 NOTE — Telephone Encounter (Signed)
Spoke with pt, aware of recs.  rx sent to preferred pharmacy.  Nothing further needed.  

## 2015-10-22 ENCOUNTER — Telehealth: Payer: Self-pay | Admitting: *Deleted

## 2015-10-22 ENCOUNTER — Encounter: Payer: Self-pay | Admitting: Internal Medicine

## 2015-10-22 ENCOUNTER — Ambulatory Visit (INDEPENDENT_AMBULATORY_CARE_PROVIDER_SITE_OTHER): Payer: Commercial Managed Care - HMO | Admitting: Internal Medicine

## 2015-10-22 ENCOUNTER — Other Ambulatory Visit: Payer: Self-pay | Admitting: *Deleted

## 2015-10-22 VITALS — BP 160/90 | HR 85 | Temp 98.2°F | Resp 20

## 2015-10-22 DIAGNOSIS — I10 Essential (primary) hypertension: Secondary | ICD-10-CM | POA: Diagnosis not present

## 2015-10-22 DIAGNOSIS — J9611 Chronic respiratory failure with hypoxia: Secondary | ICD-10-CM

## 2015-10-22 DIAGNOSIS — J441 Chronic obstructive pulmonary disease with (acute) exacerbation: Secondary | ICD-10-CM | POA: Diagnosis not present

## 2015-10-22 DIAGNOSIS — J449 Chronic obstructive pulmonary disease, unspecified: Secondary | ICD-10-CM | POA: Diagnosis not present

## 2015-10-22 DIAGNOSIS — Q2730 Arteriovenous malformation, site unspecified: Secondary | ICD-10-CM

## 2015-10-22 DIAGNOSIS — D509 Iron deficiency anemia, unspecified: Secondary | ICD-10-CM | POA: Diagnosis not present

## 2015-10-22 LAB — CBC WITH DIFFERENTIAL/PLATELET
HCT: 26.8 % — ABNORMAL LOW (ref 36.0–46.0)
Hemoglobin: 8 g/dL — CL (ref 12.0–15.0)
MCHC: 29.5 g/dL — ABNORMAL LOW (ref 30.0–36.0)
MCV: 76.9 fl — ABNORMAL LOW (ref 78.0–100.0)
Platelets: 364 10*3/uL (ref 150.0–400.0)
RBC: 3.49 Mil/uL — ABNORMAL LOW (ref 3.87–5.11)
RDW: 20.6 % — ABNORMAL HIGH (ref 11.5–15.5)
WBC: 11 10*3/uL — ABNORMAL HIGH (ref 4.0–10.5)

## 2015-10-22 MED ORDER — ESCITALOPRAM OXALATE 10 MG PO TABS: 10.0000 mg | ORAL_TABLET | Freq: Every day | ORAL | Status: DC

## 2015-10-22 MED ORDER — CRESTOR 20 MG PO TABS: 20.0000 mg | ORAL_TABLET | ORAL | Status: DC

## 2015-10-22 MED ORDER — TRAZODONE HCL 100 MG PO TABS: 100.0000 mg | ORAL_TABLET | Freq: Every day | ORAL | Status: DC

## 2015-10-22 MED ORDER — LOSARTAN POTASSIUM 25 MG PO TABS: 25.0000 mg | ORAL_TABLET | Freq: Every day | ORAL | Status: DC

## 2015-10-22 MED ORDER — ALBUTEROL SULFATE HFA 108 (90 BASE) MCG/ACT IN AERS: INHALATION_SPRAY | RESPIRATORY_TRACT | Status: DC

## 2015-10-22 NOTE — Patient Instructions (Addendum)
Limit your sodium (Salt) intake  Continue supplemental iron twice daily  Return in 3 months for follow-up  Pulmonary follow-up as scheduled yet.  She needs a CBC

## 2015-10-22 NOTE — Progress Notes (Signed)
Pre visit review using our clinic review tool, if applicable. No additional management support is needed unless otherwise documented below in the visit note. 

## 2015-10-22 NOTE — Progress Notes (Signed)
Subjective:    Patient ID: Pamela Hampton, female    DOB: [birth year], 75 y.o.   MRN: [id number]  HPI  75 year old patient who has a history of advanced COPD.  She has been treated for an exacerbation over the past 4 days and is on a prednisone taper.  She is improved. She is scheduled for pulmonary follow-up soon She has been off her Lexapro for about 1 week.  She was seen in September with worsening iron deficiency anemia.  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  She failed to return for follow-up within the month She has essential hypertension.  Past Medical History  Diagnosis Date  . Allergy     Rhinitis  . Depression   . GERD (gastroesophageal reflux disease)     Barrett's esophagus  . Hyperlipidemia   . Hypertension   . Osteoporosis   . Seizures ([place])   . Barrett's esophagus 07/19/2004  . Anemia   . Obesity   . Pneumonia   . Stroke ([place])   . Sleep apnea   . Hepatitis   . Arthritis   . Blood transfusion   . COPD (chronic obstructive pulmonary disease) ([facility])     wears 3 liters of o2 continious  . Emphysema of lung ([place])   . Ulcer   . Hiatal hernia   . 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 radiation. chemo.  . Impaired hearing   . History of oxygen administration     oxygen concentrator @ 4 l/m nasally 24/ 7    Social History   Social History  . Marital Status: Divorced    Spouse Name: N/A  . Number of Children: 4  . Years of Education: N/A   Occupational History  . disaled    Social History Main Topics  . Smoking status: Former Smoker -- 2.00 packs/day for 30 years    Types: Cigarettes    Quit date: 07/28/2002  . Smokeless tobacco: Never Used  . Alcohol Use: No  . Drug Use: No  . Sexual Activity: Not Currently   Other Topics Concern  . Not on file   Social History Narrative    Past Surgical History  Procedure Laterality Date  . Orif hip fracture Right   . Cholecystectomy    . Hemorrhoid  surgery    . Total hip arthroplasty Right   . Abdominal hysterectomy    . 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 Abdominal laproscopy surgery    . Joint replacement    . Esophagogastroduodenoscopy (egd) with propofol N/A 12/21/2014    Procedure: ESOPHAGOGASTRODUODENOSCOPY (EGD) WITH PROPOFOL;  Surgeon: Inda Castle, MD;  Location: [facility];  Service: Endoscopy;  Laterality: N/A;    Family History  Problem Relation Age of Onset  . Heart disease Mother     MI  . Diabetes Mother   . Uterine cancer Mother   . Anemia Mother   . Hypertension Mother   . Stomach cancer Father   . Esophageal cancer Father   . Hypertension Father   . Diabetes Sister   . Anemia Sister   . Esophageal cancer Brother   . Hypertension Brother   . Diabetes Son   . Diabetes Son   . Brain cancer Brother   . Lung cancer Sister   . Liver cancer Sister   . Colon cancer Neg Hx   .  Rectal cancer Neg Hx     No Known Allergies  Current Outpatient Prescriptions on File Prior to Visit  Medication Sig Dispense Refill  . albuterol (PROVENTIL) (2.5 MG/3ML) 0.083% nebulizer solution INHALE THE CONTENTS OF 1 VIAL VIA NEBULIZER 4 TIMES A DAY. 360 mL 12  . beclomethasone (QVAR) 80 MCG/ACT inhaler Inhale 2 puffs into the lungs 2 (two) times daily. 8.7 g 6  . clotrimazole-betamethasone (LOTRISONE) cream Apply 1 application topically 2 (two) times daily. 30 g 6  . conjugated estrogens (PREMARIN) vaginal cream Place 1 Applicatorful vaginally daily. 42.5 g 12  . Ferrous Sulfate Dried 200 (65 FE) MG TABS 1 tablet by mouth twice daily 30 tablet 6  . fluticasone (FLONASE) 50 MCG/ACT nasal spray PLACE 2 SPRAYS INTO BOTH NOSTRILS DAILY. 16 g 5  . furosemide (LASIX) 40 MG tablet TAKE 1 TABLET (40 MG TOTAL) BY MOUTH DAILY. (Patient taking differently: Take 40 mg by mouth daily as needed for fluid. ) 30 tablet 5  . ibandronate (BONIVA) 150 MG tablet TAKE IN THE MORNING  WITH A FULL GLASS OF WATER, ON AN EMPTY STOMACH ONCE MONTHLY. 4 tablet 3  . ipratropium (ATROVENT) 0.02 % nebulizer solution INHALE THE CONTENTS OF 1 VIAL VIA NEBULIZER 4 TIMES A DAY. 300 mL 6  . omeprazole (PRILOSEC) 20 MG capsule TAKE 1 CAPSULE BY MOUTH ONCE DAILY. 30 capsule 6  . OXYGEN Inhale 4 L/min into the lungs continuous.    . polyethylene glycol powder (GLYCOLAX/MIRALAX) powder Take 17 g by mouth 2 (two) times daily as needed. (Patient taking differently: Take 17 g by mouth daily. ) 3350 g 1  . potassium chloride SA (K-DUR,KLOR-CON) 20 MEQ tablet TAKE 1 TABLET BY MOUTH DAILY. 30 tablet 5  . predniSONE (DELTASONE) 10 MG tablet 40 mg daily x 2 days, then 20mg  daily x 2 days, then 10mg  daily x 2 days, then 5mg  daily x 2 days and stop 15 tablet 0  . predniSONE (DELTASONE) 10 MG tablet 40mg X2 days, 30mg  X2 days, 20mg  X2 days, 10mg X2 days, then stop. 20 tablet 0  . sodium chloride (AYR) 0.65 % nasal spray Place 1 spray into the nose as needed for congestion.     No current facility-administered medications on file prior to visit.    BP 160/90 mmHg  Pulse 85  Temp(Src) 98.2 F (36.8 C) (Oral)  Resp 20  SpO2 94%     Review of Systems  Constitutional: Positive for fatigue.  HENT: Negative for congestion, dental problem, hearing loss, rhinorrhea, sinus pressure, sore throat and tinnitus.   Eyes: Negative for pain, discharge and visual disturbance.  Respiratory: Positive for cough and shortness of breath.   Cardiovascular: Negative for chest pain, palpitations and leg swelling.  Gastrointestinal: Negative for nausea, vomiting, abdominal pain, diarrhea, constipation, blood in stool and abdominal distention.  Genitourinary: Negative for dysuria, urgency, frequency, hematuria, flank pain, vaginal bleeding, vaginal discharge, difficulty urinating, vaginal pain and pelvic pain.  Musculoskeletal: Negative for joint swelling, arthralgias and gait problem.  Skin: Negative for rash.    Neurological: Positive for weakness. Negative for dizziness, syncope, speech difficulty, numbness and headaches.  Hematological: Negative for adenopathy.  Psychiatric/Behavioral: Negative for behavioral problems, dysphoric mood and agitation. The patient is not nervous/anxious.        Objective:   Physical Exam  Constitutional: She is oriented to person, place, and time. She appears well-developed and well-nourished.  Appears chronically ill Wheelchair bound Afebrile Blood pressure 150/90 O2 saturation 94%  HENT:  Head:  Normocephalic.  Right Ear: External ear normal.  Left Ear: External ear normal.  Mouth/Throat: Oropharynx is clear and moist.  Eyes: Conjunctivae and EOM are normal. Pupils are equal, round, and reactive to light.  Neck: Normal range of motion. Neck supple. No thyromegaly present.  Cardiovascular: Normal rate, regular rhythm, normal heart sounds and intact distal pulses.   Pulmonary/Chest: Effort normal.  Diminished breath sounds Few scattered rhonchi No active wheezing  No increased work of breathing Frequent paroxysms of cough  Abdominal: Soft. Bowel sounds are normal. She exhibits no mass. There is no tenderness.  Musculoskeletal: Normal range of motion. She exhibits no edema.  Lymphadenopathy:    She has no cervical adenopathy.  Neurological: She is alert and oriented to person, place, and time.  Skin: Skin is warm and dry. No rash noted.  Psychiatric: She has a normal mood and affect. Her behavior is normal.          Assessment & Plan:   COPD exacerbation.  Will complete prednisone taper.  Pulmonary follow-up History of iron deficiency anemia.  Will check CBC and continue iron supplementation Depression.  Lexapro resumed Hypertension, stable Chronic respiratory failure Dyslipidemia.  Continue statin therapy

## 2015-10-22 NOTE — Telephone Encounter (Signed)
CRITICAL VALUE STICKER  CRITICAL VALUE: Hemoglobin 8.0  RECEIVER (INITALS): SJ  DATE & TIME NOTIFIED: 10/22/2015 at 1655  MD NOTIFIED: Dr. Burnice Logan verbally and message put in basket.  TIME OF NOTIFICATION: 1657  RESPONSE:

## 2015-10-23 DIAGNOSIS — J9611 Chronic respiratory failure with hypoxia: Secondary | ICD-10-CM | POA: Diagnosis not present

## 2015-10-23 DIAGNOSIS — I10 Essential (primary) hypertension: Secondary | ICD-10-CM | POA: Diagnosis not present

## 2015-10-23 DIAGNOSIS — J449 Chronic obstructive pulmonary disease, unspecified: Secondary | ICD-10-CM | POA: Diagnosis not present

## 2015-10-25 DIAGNOSIS — H43822 Vitreomacular adhesion, left eye: Secondary | ICD-10-CM | POA: Diagnosis not present

## 2015-10-25 DIAGNOSIS — H353213 Exudative age-related macular degeneration, right eye, with inactive scar: Secondary | ICD-10-CM | POA: Diagnosis not present

## 2015-10-25 DIAGNOSIS — H353122 Nonexudative age-related macular degeneration, left eye, intermediate dry stage: Secondary | ICD-10-CM | POA: Diagnosis not present

## 2015-11-08 ENCOUNTER — Telehealth: Payer: Self-pay | Admitting: Internal Medicine

## 2015-11-08 ENCOUNTER — Other Ambulatory Visit: Payer: Self-pay | Admitting: Internal Medicine

## 2015-11-08 NOTE — Telephone Encounter (Signed)
FYI

## 2015-11-08 NOTE — Telephone Encounter (Signed)
Patient Name: Pamela Hampton  DOB: 13-Feb-1941    Initial Comment Caller states has a rash on her leg.    Nurse Assessment  Nurse: Raphael Gibney, RN, Vera Date/Time (Eastern Time): 11/08/2015 12:58:43 PM  Confirm and document reason for call. If symptomatic, describe symptoms. You must click the next button to save text entered. ---Caller states her left leg has blotches. Foot was swollen last night but no swelling now. Does not itch and not painful. Noticed blotches last night.  Has the patient traveled out of the country within the last 30 days? ---Not Applicable  Does the patient have any new or worsening symptoms? ---Yes  Will a triage be completed? ---Yes  Related visit to physician within the last 2 weeks? ---No  Does the PT have any chronic conditions? (i.e. diabetes, asthma, etc.) ---Yes  List chronic conditions. ---COPD  Is this a behavioral health or substance abuse call? ---No     Guidelines    Guideline Title Affirmed Question Affirmed Notes  Rash or Redness - Localized Mild localized rash (all triage questions negative)    Final Disposition User   Phillips, RN, Vanita Ingles    Disagree/Comply: Comply

## 2015-11-10 DIAGNOSIS — J449 Chronic obstructive pulmonary disease, unspecified: Secondary | ICD-10-CM | POA: Diagnosis not present

## 2015-11-19 ENCOUNTER — Ambulatory Visit: Payer: Commercial Managed Care - HMO | Admitting: Internal Medicine

## 2015-11-22 ENCOUNTER — Other Ambulatory Visit: Payer: Self-pay | Admitting: Internal Medicine

## 2015-11-23 DIAGNOSIS — I10 Essential (primary) hypertension: Secondary | ICD-10-CM | POA: Diagnosis not present

## 2015-11-23 DIAGNOSIS — J9611 Chronic respiratory failure with hypoxia: Secondary | ICD-10-CM | POA: Diagnosis not present

## 2015-11-23 DIAGNOSIS — J449 Chronic obstructive pulmonary disease, unspecified: Secondary | ICD-10-CM | POA: Diagnosis not present

## 2015-11-27 ENCOUNTER — Telehealth: Payer: Self-pay | Admitting: Internal Medicine

## 2015-11-27 MED ORDER — PREDNISONE 10 MG PO TABS: ORAL_TABLET | ORAL | Status: DC

## 2015-11-27 MED ORDER — AZITHROMYCIN 250 MG PO TABS: ORAL_TABLET | ORAL | Status: DC

## 2015-11-27 NOTE — Telephone Encounter (Signed)
Spoke with pt, c/o prod cough with clear/yellow mucus X3 days.  Also notes PND.  Denies chest pain, fever, chills.  Requesting prednisone taper. Pt has not taken anything to help with s/s.   Pt uses [facility]    MR please advise on recs.  Thanks.

## 2015-11-27 NOTE — Telephone Encounter (Signed)
Left message for patient to call back  

## 2015-11-27 NOTE — Telephone Encounter (Signed)
rxs sent to [facility] - [id number] for pt

## 2015-11-27 NOTE — Telephone Encounter (Signed)
ZPak  Please take prednisone 40 mg x1 day, then 30 mg x1 day, then 20 mg x1 day, then 10 mg x1 day, and then 5 mg x1 day and stop

## 2015-11-27 NOTE — Telephone Encounter (Signed)
Janeece Fitting called on behalf of patient, states calling back for patient. He can be reached at [phone]. Thanks -prm

## 2015-11-28 ENCOUNTER — Observation Stay (HOSPITAL_COMMUNITY)
Admission: EM | Admit: 2015-11-28 | Discharge: 2015-11-30 | Disposition: A | Discharge status: Home / Self Care | Payer: Commercial Managed Care - HMO | Attending: Internal Medicine | Admitting: Internal Medicine

## 2015-11-28 ENCOUNTER — Emergency Department (HOSPITAL_COMMUNITY): Payer: Commercial Managed Care - HMO

## 2015-11-28 ENCOUNTER — Encounter (HOSPITAL_COMMUNITY): Payer: Self-pay | Admitting: Nurse Practitioner

## 2015-11-28 DIAGNOSIS — Z923 Personal history of irradiation: Secondary | ICD-10-CM | POA: Diagnosis not present

## 2015-11-28 DIAGNOSIS — Z87891 Personal history of nicotine dependence: Secondary | ICD-10-CM | POA: Diagnosis not present

## 2015-11-28 DIAGNOSIS — Z8544 Personal history of malignant neoplasm of other female genital organs: Secondary | ICD-10-CM | POA: Diagnosis not present

## 2015-11-28 DIAGNOSIS — F329 Major depressive disorder, single episode, unspecified: Secondary | ICD-10-CM | POA: Diagnosis not present

## 2015-11-28 DIAGNOSIS — E46 Unspecified protein-calorie malnutrition: Secondary | ICD-10-CM | POA: Insufficient documentation

## 2015-11-28 DIAGNOSIS — I10 Essential (primary) hypertension: Secondary | ICD-10-CM | POA: Diagnosis not present

## 2015-11-28 DIAGNOSIS — Z8673 Personal history of transient ischemic attack (TIA), and cerebral infarction without residual deficits: Secondary | ICD-10-CM | POA: Insufficient documentation

## 2015-11-28 DIAGNOSIS — K449 Diaphragmatic hernia without obstruction or gangrene: Secondary | ICD-10-CM | POA: Diagnosis not present

## 2015-11-28 DIAGNOSIS — E785 Hyperlipidemia, unspecified: Secondary | ICD-10-CM | POA: Diagnosis not present

## 2015-11-28 DIAGNOSIS — H9190 Unspecified hearing loss, unspecified ear: Secondary | ICD-10-CM | POA: Diagnosis not present

## 2015-11-28 DIAGNOSIS — T7840XA Allergy, unspecified, initial encounter: Secondary | ICD-10-CM | POA: Diagnosis not present

## 2015-11-28 DIAGNOSIS — J309 Allergic rhinitis, unspecified: Secondary | ICD-10-CM | POA: Insufficient documentation

## 2015-11-28 DIAGNOSIS — Z9981 Dependence on supplemental oxygen: Secondary | ICD-10-CM | POA: Diagnosis not present

## 2015-11-28 DIAGNOSIS — M810 Age-related osteoporosis without current pathological fracture: Secondary | ICD-10-CM | POA: Diagnosis present

## 2015-11-28 DIAGNOSIS — J9610 Chronic respiratory failure, unspecified whether with hypoxia or hypercapnia: Secondary | ICD-10-CM | POA: Diagnosis not present

## 2015-11-28 DIAGNOSIS — R233 Spontaneous ecchymoses: Secondary | ICD-10-CM

## 2015-11-28 DIAGNOSIS — Z7951 Long term (current) use of inhaled steroids: Secondary | ICD-10-CM | POA: Insufficient documentation

## 2015-11-28 DIAGNOSIS — Z6828 Body mass index (BMI) 28.0-28.9, adult: Secondary | ICD-10-CM | POA: Insufficient documentation

## 2015-11-28 DIAGNOSIS — D500 Iron deficiency anemia secondary to blood loss (chronic): Principal | ICD-10-CM | POA: Insufficient documentation

## 2015-11-28 DIAGNOSIS — K219 Gastro-esophageal reflux disease without esophagitis: Secondary | ICD-10-CM | POA: Insufficient documentation

## 2015-11-28 DIAGNOSIS — Z79890 Hormone replacement therapy (postmenopausal): Secondary | ICD-10-CM | POA: Insufficient documentation

## 2015-11-28 DIAGNOSIS — N182 Chronic kidney disease, stage 2 (mild): Secondary | ICD-10-CM | POA: Diagnosis not present

## 2015-11-28 DIAGNOSIS — I2510 Atherosclerotic heart disease of native coronary artery without angina pectoris: Secondary | ICD-10-CM | POA: Diagnosis not present

## 2015-11-28 DIAGNOSIS — E669 Obesity, unspecified: Secondary | ICD-10-CM | POA: Diagnosis not present

## 2015-11-28 DIAGNOSIS — M1990 Unspecified osteoarthritis, unspecified site: Secondary | ICD-10-CM | POA: Diagnosis not present

## 2015-11-28 DIAGNOSIS — D6489 Other specified anemias: Secondary | ICD-10-CM | POA: Diagnosis present

## 2015-11-28 DIAGNOSIS — D689 Coagulation defect, unspecified: Secondary | ICD-10-CM

## 2015-11-28 DIAGNOSIS — I129 Hypertensive chronic kidney disease with stage 1 through stage 4 chronic kidney disease, or unspecified chronic kidney disease: Secondary | ICD-10-CM | POA: Insufficient documentation

## 2015-11-28 DIAGNOSIS — L309 Dermatitis, unspecified: Secondary | ICD-10-CM | POA: Diagnosis not present

## 2015-11-28 DIAGNOSIS — Z79899 Other long term (current) drug therapy: Secondary | ICD-10-CM | POA: Insufficient documentation

## 2015-11-28 DIAGNOSIS — G4730 Sleep apnea, unspecified: Secondary | ICD-10-CM | POA: Insufficient documentation

## 2015-11-28 DIAGNOSIS — Z96641 Presence of right artificial hip joint: Secondary | ICD-10-CM | POA: Insufficient documentation

## 2015-11-28 DIAGNOSIS — Z9221 Personal history of antineoplastic chemotherapy: Secondary | ICD-10-CM | POA: Insufficient documentation

## 2015-11-28 DIAGNOSIS — Z7983 Long term (current) use of bisphosphonates: Secondary | ICD-10-CM | POA: Insufficient documentation

## 2015-11-28 DIAGNOSIS — J449 Chronic obstructive pulmonary disease, unspecified: Secondary | ICD-10-CM | POA: Diagnosis present

## 2015-11-28 DIAGNOSIS — N179 Acute kidney failure, unspecified: Secondary | ICD-10-CM | POA: Insufficient documentation

## 2015-11-28 DIAGNOSIS — L8992 Pressure ulcer of unspecified site, stage 2: Secondary | ICD-10-CM | POA: Diagnosis present

## 2015-11-28 DIAGNOSIS — R21 Rash and other nonspecific skin eruption: Secondary | ICD-10-CM | POA: Diagnosis present

## 2015-11-28 DIAGNOSIS — L509 Urticaria, unspecified: Secondary | ICD-10-CM | POA: Diagnosis not present

## 2015-11-28 DIAGNOSIS — D649 Anemia, unspecified: Secondary | ICD-10-CM | POA: Insufficient documentation

## 2015-11-28 DIAGNOSIS — M7989 Other specified soft tissue disorders: Secondary | ICD-10-CM | POA: Diagnosis not present

## 2015-11-28 LAB — CBC WITH DIFFERENTIAL/PLATELET
BASOS ABS: 0.1 10*3/uL (ref 0.0–0.1)
Basophils Relative: 1 %
EOS ABS: 0.2 10*3/uL (ref 0.0–0.7)
EOS PCT: 2 %
HCT: 23.5 % — ABNORMAL LOW (ref 36.0–46.0)
Hemoglobin: 6.9 g/dL — CL (ref 12.0–15.0)
Lymphocytes Relative: 33 %
Lymphs Abs: 2.8 10*3/uL (ref 0.7–4.0)
MCH: 22.8 pg — ABNORMAL LOW (ref 26.0–34.0)
MCHC: 29.4 g/dL — ABNORMAL LOW (ref 30.0–36.0)
MCV: 77.8 fL — ABNORMAL LOW (ref 78.0–100.0)
MONO ABS: 0.5 10*3/uL (ref 0.1–1.0)
Monocytes Relative: 6 %
NEUTROS ABS: 5 10*3/uL (ref 1.7–7.7)
Neutrophils Relative %: 58 %
PLATELETS: 465 10*3/uL — AB (ref 150–400)
RBC: 3.02 MIL/uL — ABNORMAL LOW (ref 3.87–5.11)
RDW: 19.2 % — AB (ref 11.5–15.5)
WBC: 8.6 10*3/uL (ref 4.0–10.5)

## 2015-11-28 LAB — URINALYSIS, ROUTINE W REFLEX MICROSCOPIC
BILIRUBIN URINE: NEGATIVE
Glucose, UA: NEGATIVE mg/dL
KETONES UR: NEGATIVE mg/dL
NITRITE: NEGATIVE
Protein, ur: NEGATIVE mg/dL
SPECIFIC GRAVITY, URINE: 1.007 (ref 1.005–1.030)
pH: 6 (ref 5.0–8.0)

## 2015-11-28 LAB — COMPREHENSIVE METABOLIC PANEL
ALBUMIN: 2.4 g/dL — AB (ref 3.5–5.0)
ALT: 10 U/L — ABNORMAL LOW (ref 14–54)
AST: 20 U/L (ref 15–41)
Alkaline Phosphatase: 75 U/L (ref 38–126)
Anion gap: 13 (ref 5–15)
BUN: 12 mg/dL (ref 6–20)
CHLORIDE: 99 mmol/L — AB (ref 101–111)
CO2: 29 mmol/L (ref 22–32)
Calcium: 7.5 mg/dL — ABNORMAL LOW (ref 8.9–10.3)
Creatinine, Ser: 1.24 mg/dL — ABNORMAL HIGH (ref 0.44–1.00)
GFR calc Af Amer: 49 mL/min — ABNORMAL LOW (ref >60)
GFR calc non Af Amer: 42 mL/min — ABNORMAL LOW (ref >60)
GLUCOSE: 94 mg/dL (ref 65–99)
POTASSIUM: 2.6 mmol/L — AB (ref 3.5–5.1)
SODIUM: 141 mmol/L (ref 135–145)
Total Bilirubin: 0.3 mg/dL (ref 0.3–1.2)
Total Protein: 7.1 g/dL (ref 6.5–8.1)

## 2015-11-28 LAB — FIBRINOGEN: FIBRINOGEN: 518 mg/dL — AB (ref 204–475)

## 2015-11-28 LAB — IRON AND TIBC
IRON: 10 ug/dL — AB (ref 28–170)
Saturation Ratios: 4 % — ABNORMAL LOW (ref 10.4–31.8)
TIBC: 269 ug/dL (ref 250–450)
UIBC: 259 ug/dL

## 2015-11-28 LAB — APTT: APTT: 49 s — AB (ref 24–37)

## 2015-11-28 LAB — RETICULOCYTES
RBC.: 2.9 MIL/uL — AB (ref 3.87–5.11)
RETIC COUNT ABSOLUTE: 127.6 10*3/uL (ref 19.0–186.0)
RETIC CT PCT: 4.4 % — AB (ref 0.4–3.1)

## 2015-11-28 LAB — URINE MICROSCOPIC-ADD ON

## 2015-11-28 LAB — PREPARE RBC (CROSSMATCH)

## 2015-11-28 LAB — PROTIME-INR
INR: 1.24 (ref 0.00–1.49)
Prothrombin Time: 15.3 seconds — ABNORMAL HIGH (ref 11.6–15.2)

## 2015-11-28 LAB — FERRITIN: FERRITIN: 18 ng/mL (ref 11–307)

## 2015-11-28 LAB — LACTATE DEHYDROGENASE: LDH: 192 U/L (ref 98–192)

## 2015-11-28 LAB — MAGNESIUM: Magnesium: 1.2 mg/dL — ABNORMAL LOW (ref 1.7–2.4)

## 2015-11-28 LAB — VITAMIN B12: Vitamin B-12: 1012 pg/mL — ABNORMAL HIGH (ref 180–914)

## 2015-11-28 LAB — POC OCCULT BLOOD, ED: Fecal Occult Bld: NEGATIVE

## 2015-11-28 LAB — SAVE SMEAR

## 2015-11-28 LAB — FOLATE: Folate: 8.5 ng/mL (ref >5.9)

## 2015-11-28 MED ORDER — SODIUM CHLORIDE 0.9 % IV SOLN
10.0000 mL/h | Freq: Once | INTRAVENOUS | Status: AC
  Administered 2015-11-28: 10 mL/h via INTRAVENOUS

## 2015-11-28 MED ORDER — ESCITALOPRAM OXALATE 10 MG PO TABS
10.0000 mg | ORAL_TABLET | Freq: Every day | ORAL | Status: DC
  Administered 2015-11-29 – 2015-11-30 (×3): 10 mg via ORAL
  Filled 2015-11-28 (×3): qty 1

## 2015-11-28 MED ORDER — ONDANSETRON HCL 4 MG/2ML IJ SOLN
4.0000 mg | Freq: Four times a day (QID) | INTRAMUSCULAR | Status: DC | PRN
  Administered 2015-11-29: 4 mg via INTRAVENOUS
  Filled 2015-11-28: qty 2

## 2015-11-28 MED ORDER — BUDESONIDE 0.25 MG/2ML IN SUSP
0.2500 mg | Freq: Two times a day (BID) | RESPIRATORY_TRACT | Status: DC
  Administered 2015-11-29 – 2015-11-30 (×3): 0.25 mg via RESPIRATORY_TRACT
  Filled 2015-11-28 (×3): qty 2

## 2015-11-28 MED ORDER — SODIUM CHLORIDE 0.9 % IV SOLN
INTRAVENOUS | Status: DC
  Administered 2015-11-29: 01:00:00 via INTRAVENOUS

## 2015-11-28 MED ORDER — ONDANSETRON HCL 4 MG PO TABS: 4.0000 mg | ORAL_TABLET | Freq: Four times a day (QID) | ORAL | Status: DC | PRN

## 2015-11-28 MED ORDER — POTASSIUM CHLORIDE CRYS ER 20 MEQ PO TBCR
40.0000 meq | EXTENDED_RELEASE_TABLET | Freq: Once | ORAL | Status: AC
  Administered 2015-11-28: 40 meq via ORAL
  Filled 2015-11-28: qty 2

## 2015-11-28 MED ORDER — ACETAMINOPHEN 650 MG RE SUPP: 650.0000 mg | Freq: Four times a day (QID) | RECTAL | Status: DC | PRN

## 2015-11-28 MED ORDER — BECLOMETHASONE DIPROPIONATE 80 MCG/ACT IN AERS: 2.0000 | INHALATION_SPRAY | Freq: Two times a day (BID) | RESPIRATORY_TRACT | Status: DC

## 2015-11-28 MED ORDER — SODIUM CHLORIDE 0.9 % IV SOLN: 250.0000 mL | INTRAVENOUS | Status: DC | PRN

## 2015-11-28 MED ORDER — OXYCODONE HCL 5 MG PO TABS
5.0000 mg | ORAL_TABLET | ORAL | Status: DC | PRN
  Administered 2015-11-29: 5 mg via ORAL
  Filled 2015-11-28: qty 1

## 2015-11-28 MED ORDER — HYDROMORPHONE HCL 1 MG/ML IJ SOLN: 0.5000 mg | INTRAMUSCULAR | Status: DC | PRN

## 2015-11-28 MED ORDER — IPRATROPIUM-ALBUTEROL 0.5-2.5 (3) MG/3ML IN SOLN
3.0000 mL | Freq: Four times a day (QID) | RESPIRATORY_TRACT | Status: DC
  Administered 2015-11-29 – 2015-11-30 (×7): 3 mL via RESPIRATORY_TRACT
  Filled 2015-11-28 (×7): qty 3

## 2015-11-28 MED ORDER — POTASSIUM CHLORIDE CRYS ER 20 MEQ PO TBCR
20.0000 meq | EXTENDED_RELEASE_TABLET | Freq: Every day | ORAL | Status: DC
  Administered 2015-11-29 – 2015-11-30 (×2): 20 meq via ORAL
  Filled 2015-11-28 (×2): qty 1

## 2015-11-28 MED ORDER — IPRATROPIUM-ALBUTEROL 0.5-2.5 (3) MG/3ML IN SOLN: 3.0000 mL | RESPIRATORY_TRACT | Status: DC

## 2015-11-28 MED ORDER — SODIUM CHLORIDE 0.9% FLUSH: 3.0000 mL | INTRAVENOUS | Status: DC | PRN

## 2015-11-28 MED ORDER — ACETAMINOPHEN 325 MG PO TABS
650.0000 mg | ORAL_TABLET | Freq: Four times a day (QID) | ORAL | Status: DC | PRN
  Administered 2015-11-29: 650 mg via ORAL
  Filled 2015-11-28: qty 2

## 2015-11-28 MED ORDER — POTASSIUM CHLORIDE 10 MEQ/100ML IV SOLN
10.0000 meq | INTRAVENOUS | Status: AC
  Administered 2015-11-28 (×2): 10 meq via INTRAVENOUS
  Filled 2015-11-28 (×2): qty 100

## 2015-11-28 MED ORDER — TRAZODONE HCL 50 MG PO TABS
100.0000 mg | ORAL_TABLET | Freq: Every day | ORAL | Status: DC
  Administered 2015-11-29 (×2): 100 mg via ORAL
  Filled 2015-11-28 (×2): qty 2

## 2015-11-28 MED ORDER — ROSUVASTATIN CALCIUM 20 MG PO TABS
20.0000 mg | ORAL_TABLET | ORAL | Status: DC
  Administered 2015-11-30: 20 mg via ORAL
  Filled 2015-11-28: qty 1

## 2015-11-28 MED ORDER — SODIUM CHLORIDE 0.9% FLUSH
3.0000 mL | Freq: Two times a day (BID) | INTRAVENOUS | Status: DC
  Administered 2015-11-29: 3 mL via INTRAVENOUS

## 2015-11-28 NOTE — ED Provider Notes (Signed)
CSN: [id number]     Arrival date & time 11/28/15  1725 History   First MD Initiated Contact with Patient 11/28/15 1729     Chief Complaint  Patient presents with  . Rash    HPI Comments: 75 year old female who presents with a rash for the past 2 days. PMH significant for COPD on 4L O2 and chronic iron def anemia requiring iron infusions and blood transfusions. Her L foot started swelling 3-4 days ago followed by the rash on her L foot. The rash has since spread up bilateral legs, torso, back, upper extremities. The rash "burns". No itching, bleeding, new meds, degergents, soaps, insect bites, no blood thinners. Family states she had a similar rash 1 month ago which subsequently cleared. Patient has had Zostavax. No fever, chills, CP or tightness, SOB, abdominal pain, N/V/D, dysuria.   Patient is a 75 y.o. female presenting with rash.  Rash Associated symptoms: no fever     Past Medical History  Diagnosis Date  . Allergy     Rhinitis  . Depression   . GERD (gastroesophageal reflux disease)     Barrett's esophagus  . Hyperlipidemia   . Hypertension   . Osteoporosis   . Seizures ([place])   . Barrett's esophagus 07/19/2004  . Anemia   . Obesity   . Pneumonia   . Stroke ([place])   . Sleep apnea   . Hepatitis   . Arthritis   . Blood transfusion   . COPD (chronic obstructive pulmonary disease) (HCC)     wears 3 liters of o2 continious  . Emphysema of lung ([place])   . Ulcer   . Hiatal hernia   . 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 radiation. chemo.  . Impaired hearing   . History of oxygen administration     oxygen concentrator @ 4 l/m nasally 24/ 7   Past Surgical History  Procedure Laterality Date  . Orif hip fracture Right   . Cholecystectomy    . Hemorrhoid surgery    . Total hip arthroplasty Right   . Abdominal hysterectomy    . 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 Abdominal laproscopy surgery    .  Joint replacement    . Esophagogastroduodenoscopy (egd) with propofol N/A 12/21/2014    Procedure: ESOPHAGOGASTRODUODENOSCOPY (EGD) WITH PROPOFOL;  Surgeon: Inda Castle, MD;  Location: [facility];  Service: Endoscopy;  Laterality: N/A;   Family History  Problem Relation Age of Onset  . Heart disease Mother     MI  . Diabetes Mother   . Uterine cancer Mother   . Anemia Mother   . Hypertension Mother   . Stomach cancer Father   . Esophageal cancer Father   . Hypertension Father   . Diabetes Sister   . Anemia Sister   . Esophageal cancer Brother   . Hypertension Brother   . Diabetes Son   . Diabetes Son   . Brain cancer Brother   . Lung cancer Sister   . Liver cancer Sister   . Colon cancer Neg Hx   . Rectal cancer Neg Hx    Social History  Substance Use Topics  . Smoking status: Former Smoker -- 2.00 packs/day for 30 years    Types: Cigarettes    Quit date: 07/28/2002  . Smokeless tobacco: Never Used  . Alcohol Use: No   OB History    Gravida Para Term  Preterm AB TAB SAB Ectopic Multiple Living   4 4  0 0 0 0 0 0 4     Review of Systems  Constitutional: Negative for fever and chills.  Skin: Positive for rash.  All other systems reviewed and are negative.     Allergies  Review of patient's allergies indicates no known allergies.  Home Medications   Prior to Admission medications   Medication Sig Start Date End Date Taking? Authorizing Provider  albuterol (PROAIR HFA) 108 (90 Base) MCG/ACT inhaler INHALE 2 PUFFS INTO THE LUNGS EVERY 4 HOURS AS NEEDED FOR SHORTNESS OF BREATH 10/22/15  Yes Marletta Lor, MD  albuterol (PROVENTIL) (2.5 MG/3ML) 0.083% nebulizer solution INHALE THE CONTENTS OF 1 VIAL VIA NEBULIZER 4 TIMES A DAY. 11/08/15  Yes Brand Males, MD  beclomethasone (QVAR) 80 MCG/ACT inhaler Inhale 2 puffs into the lungs 2 (two) times daily. 12/01/14  Yes Brand Males, MD  CRESTOR 20 MG tablet Take 1 tablet (20 mg total) by mouth every other  day. 10/22/15  Yes Marletta Lor, MD  escitalopram (LEXAPRO) 10 MG tablet Take 1 tablet (10 mg total) by mouth daily. 10/22/15  Yes Marletta Lor, MD  Ferrous Sulfate Dried 200 (65 FE) MG TABS 1 tablet by mouth twice daily 04/24/15  Yes Marletta Lor, MD  fluticasone [facility]) 50 MCG/ACT nasal spray PLACE 2 SPRAYS INTO BOTH NOSTRILS DAILY. 07/06/15  Yes Marletta Lor, MD  furosemide (LASIX) 40 MG tablet TAKE 1 TABLET (40 MG TOTAL) BY MOUTH DAILY. Patient taking differently: Take 40 mg by mouth daily as needed for fluid.  03/01/13  Yes Marletta Lor, MD  ibandronate (BONIVA) 150 MG tablet TAKE IN THE MORNING WITH A FULL GLASS OF WATER, ON AN EMPTY STOMACH ONCE MONTHLY. 05/14/15  Yes Marletta Lor, MD  ipratropium (ATROVENT) 0.02 % nebulizer solution INHALE THE CONTENTS OF 1 VIAL VIA NEBULIZER 4 TIMES A DAY. 08/13/15  Yes Brand Males, MD  losartan (COZAAR) 25 MG tablet Take 1 tablet (25 mg total) by mouth daily. 10/22/15  Yes Marletta Lor, MD  omeprazole (PRILOSEC) 20 MG capsule TAKE 1 CAPSULE BY MOUTH ONCE DAILY. 04/26/15  Yes Brand Males, MD  polyethylene glycol powder (GLYCOLAX/MIRALAX) powder Take 17 g by mouth 2 (two) times daily as needed. Patient taking differently: Take 17 g by mouth daily.  08/30/14  Yes Marletta Lor, MD  potassium chloride SA (K-DUR,KLOR-CON) 20 MEQ tablet TAKE 1 TABLET BY MOUTH DAILY. 11/22/15  Yes Marletta Lor, MD  sodium chloride (AYR) 0.65 % nasal spray Place 1 spray into the nose as needed for congestion.   Yes Historical Provider, MD  traZODone (DESYREL) 100 MG tablet Take 1 tablet (100 mg total) by mouth at bedtime. 10/22/15  Yes Marletta Lor, MD  azithromycin (ZITHROMAX Z-PAK) 250 MG tablet Take as directed 11/27/15   Brand Males, MD  clotrimazole-betamethasone (LOTRISONE) cream Apply 1 application topically 2 (two) times daily. Patient not taking: Reported on 11/28/2015 08/10/14   Mora Bellman, MD    conjugated estrogens (PREMARIN) vaginal cream Place 1 Applicatorful vaginally daily. Patient not taking: Reported on 11/28/2015 08/10/14   Mora Bellman, MD  OXYGEN Inhale 4 L/min into the lungs continuous.    Historical Provider, MD  predniSONE (DELTASONE) 10 MG tablet 40 mg daily x 2 days, then 20mg  daily x 2 days, then 10mg  daily x 2 days, then 5mg  daily x 2 days and stop Patient not taking: Reported on 11/28/2015 09/04/15  Brand Males, MD  predniSONE (DELTASONE) 10 MG tablet 40mg X2 days, 30mg  X2 days, 20mg  X2 days, 10mg X2 days, then stop. Patient not taking: Reported on 11/28/2015 10/18/15   Deneise Lever, MD  predniSONE (DELTASONE) 10 MG tablet 40 mg x1 day, then 30 mg x1 day, then 20 mg x1 day, then 10 mg x1 day, and then 5 mg x1 day and stop 11/27/15   Brand Males, MD   BP 131/100 mmHg  Pulse 78  Temp(Src) 98.3 F (36.8 C) (Oral)  Resp 18  SpO2 96%   Physical Exam  Constitutional: She is oriented to person, place, and time. She appears well-developed and well-nourished. No distress.  Chronically ill, HOH  HENT:  Head: Normocephalic and atraumatic.  Eyes: Conjunctivae are normal. Pupils are equal, round, and reactive to light. Right eye exhibits no discharge. Left eye exhibits no discharge. No scleral icterus.  Neck: Normal range of motion.  Cardiovascular: Normal rate and regular rhythm.  Exam reveals no gallop and no friction rub.   No murmur heard. Pulmonary/Chest: Effort normal and breath sounds normal. No respiratory distress. She has no wheezes. She has no rales. She exhibits no tenderness.  Neurological: She is alert and oriented to person, place, and time.  Skin: Skin is warm and dry.  Generalized petechial rash on upper and lower extremities, abdomen, flank with some confluent purpura on her abdomen. Minimal swelling of L foot with several areas of healing wounds. Tenderness to palpation of L foot.  Psychiatric: She has a normal mood and affect.    ED Course   Procedures (including critical care time) Labs Review Labs Reviewed  CBC WITH DIFFERENTIAL/PLATELET - Abnormal; Notable for the following:    RBC 3.02 (*)    Hemoglobin 6.9 (*)    HCT 23.5 (*)    MCV 77.8 (*)    MCH 22.8 (*)    MCHC 29.4 (*)    RDW 19.2 (*)    Platelets 465 (*)    All other components within normal limits  COMPREHENSIVE METABOLIC PANEL - Abnormal; Notable for the following:    Potassium 2.6 (*)    Chloride 99 (*)    Creatinine, Ser 1.24 (*)    Calcium 7.5 (*)    Albumin 2.4 (*)    ALT 10 (*)    GFR calc non Af Amer 42 (*)    GFR calc Af Amer 49 (*)    All other components within normal limits  PROTIME-INR - Abnormal; Notable for the following:    Prothrombin Time 15.3 (*)    All other components within normal limits  VITAMIN B12 - Abnormal; Notable for the following:    Vitamin B-12 1012 (*)    All other components within normal limits  IRON AND TIBC - Abnormal; Notable for the following:    Iron 10 (*)    Saturation Ratios 4 (*)    All other components within normal limits  RETICULOCYTES - Abnormal; Notable for the following:    Retic Ct Pct 4.4 (*)    RBC. 2.90 (*)    All other components within normal limits  URINALYSIS, ROUTINE W REFLEX MICROSCOPIC (NOT AT [facility]) - Abnormal; Notable for the following:    APPearance CLOUDY (*)    Hgb urine dipstick SMALL (*)    Leukocytes, UA LARGE (*)    All other components within normal limits  FIBRINOGEN - Abnormal; Notable for the following:    Fibrinogen 518 (*)    All other components within  normal limits  APTT - Abnormal; Notable for the following:    aPTT 49 (*)    All other components within normal limits  MAGNESIUM - Abnormal; Notable for the following:    Magnesium 1.2 (*)    All other components within normal limits  URINE MICROSCOPIC-ADD ON - Abnormal; Notable for the following:    Squamous Epithelial / LPF 0-5 (*)    Bacteria, UA MANY (*)    All other components within normal limits  LACTATE  DEHYDROGENASE  FOLATE  FERRITIN  SAVE SMEAR  HAPTOGLOBIN  BASIC METABOLIC PANEL  CBC  POC OCCULT BLOOD, ED  TYPE AND SCREEN  PREPARE RBC (CROSSMATCH)    Imaging Review Dg Foot Complete Left  11/28/2015  CLINICAL DATA:  75 year old acute onset of left foot swelling and rash which began earlier today. No known injuries. EXAM: LEFT FOOT - COMPLETE 3+ VIEW COMPARISON:  None. FINDINGS: Severe generalized osseous demineralization. Possible avulsion fracture of indeterminate age arising from the dorsal aspect of the navicular bone. No fractures elsewhere. Diffuse soft tissue swelling. Well preserved joint spaces. IMPRESSION: 1. Possible avulsion fracture of indeterminate age arising from the dorsal aspect of the navicular bone. Please correlate with point tenderness. 2. No fractures elsewhere. 3. Severe generalized osseous demineralization. Electronically Signed   By: Evangeline Dakin M.D.   On: 11/28/2015 18:40   I have personally reviewed and evaluated these images and lab results as part of my medical decision-making.   EKG Interpretation None     Meds given in ED:  Medications  potassium chloride 10 mEq in 100 mL IVPB (10 mEq Intravenous New Bag/Given 11/28/15 2113)  potassium chloride SA (K-DUR,KLOR-CON) CR tablet 40 mEq (40 mEq Oral Given 11/28/15 2116)  0.9 %  sodium chloride infusion (10 mL/hr Intravenous New Bag/Given 11/28/15 2113)    New Prescriptions   No medications on file   MDM   Final diagnoses:  Petechiae  Anemia due to other cause   75 year old female who presents with a generalized petechial rash for the past couple days. Question possible ITP/TTP vs hemolytic anemia vs chronic anemia?  Vitals are stable. She is afebrile, not tachycardic, normotensive. PE reveals petichial rash with purpura. Otherwise her exam is unremarkable.   CBC is remarkable for H&H of 6.9/23.5, down from 8/26.8 on 3/27. Platelets are normal. Type & Screen and coags obtained. CMP is remarkable for  K of 2.6. K given. SCr is 1.24 up from .8 on 1/11. IVF started. UA shows small Hgb with large leukocytes and many bacteria. X-ray shows possible avulsion fracture of indeterminate age and generalized demineralization with diffuse soft tissue swelling. Occult blood neg.  Shared visit with Dr. Darl Householder. Hemotology consulted who recommended to obtain an anemia panel. Since bilirubin is normal, they believe this is not a hemolytic process and ok'ed blood transfusion; PRBCs ordered. Consulted Dr. Arnoldo Morale for admission for further management.    Recardo Evangelist, PA-C 11/28/15 2328  Wandra Arthurs, MD 11/29/15 2021

## 2015-11-28 NOTE — H&P (Addendum)
[facility] Admission History and Physical       Pamela Hampton [id number] DOB: [birth year] DOA: 11/28/2015  Referring physician: EDP PCP: Nyoka Cowden, MD  Specialists:   Chief Complaint: Rash  HPI: Pamela Hampton is a 75 y.o. female with a history of COPD on 3 liters NCO2 Continuously, CAD, HTN, and Remote History of Vaginal Cancer S/P Chemo Rx, Radiation and Radiation Implants who presents to the ED with complaints of a rash on her legs spreading upward on her body x 2-3 days.  She denies any pruritis, and denies any fevers or chills.   She reports having burning in both of her feet and increased pain in her left foot.  An x-ray of the left Foot revealed an age indeternimant navicular fracture.    Her lab studies reveaeld a hemoglobin level of 6.9 wehereas she had previously had a hemoglobin in the 8's.   The EDP Dr Darl Householder consulted Heamtology who will seen her in the AM, and    Review of Systems:    Constitutional: No Weight Loss, No Weight Gain, Night Sweats, Fevers, Chills, Dizziness, Light Headedness, Fatigue, or Generalized Weakness HEENT: No Headaches, Difficulty Swallowing,Tooth/Dental Problems,Sore Throat,  No Sneezing, Rhinitis, Ear Ache, Nasal Congestion, or Post Nasal Drip,  Cardio-vascular:  No Chest pain, Orthopnea, PND, Edema in Lower Extremities, Anasarca, Dizziness, Palpitations  Resp: No Dyspnea, No DOE, No Productive Cough, No Non-Productive Cough, No Hemoptysis, No Wheezing.    GI: No Heartburn, Indigestion, Abdominal Pain, Nausea, Vomiting, Diarrhea, Constipation, Hematemesis, Hematochezia, Melena, Change in Bowel Habits,  Loss of Appetite  GU: No Dysuria, No Change in Color of Urine, No Urgency or Urinary Frequency, No Flank pain.  Musculoskeletal: No Joint Pain or Swelling, No Decreased Range of Motion, No Back Pain.  Neurologic: No Syncope, No Seizures, Muscle Weakness, Paresthesia, Vision Disturbance or Loss, No Diplopia, No Vertigo, No  Difficulty Walking,  Skin: +Rash or Lesions. Psych: No Change in Mood or Affect, No Depression or Anxiety, No Memory loss, No Confusion, or Hallucinations   Past Medical History  Diagnosis Date  . Allergy     Rhinitis  . Depression   . GERD (gastroesophageal reflux disease)     Barrett's esophagus  . Hyperlipidemia   . Hypertension   . Osteoporosis   . Seizures ([place])   . Barrett's esophagus 07/19/2004  . Anemia   . Obesity   . Pneumonia   . Stroke ([place])   . Sleep apnea   . Hepatitis   . Arthritis   . Blood transfusion   . COPD (chronic obstructive pulmonary disease) (HCC)     wears 3 liters of o2 continious  . Emphysema of lung ([place])   . Ulcer   . Hiatal hernia   . 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 radiation. chemo.  . Impaired hearing   . History of oxygen administration     oxygen concentrator @ 4 l/m nasally 24/ 7     Past Surgical History  Procedure Laterality Date  . Orif hip fracture Right   . Cholecystectomy    . Hemorrhoid surgery    . Total hip arthroplasty Right   . Abdominal hysterectomy    . 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 Abdominal laproscopy surgery    . Joint replacement    . Esophagogastroduodenoscopy (egd) with propofol N/A 12/21/2014    Procedure: ESOPHAGOGASTRODUODENOSCOPY (EGD) WITH PROPOFOL;  Surgeon: Herbie Baltimore  Shaaron Adler, MD;  Location: [facility];  Service: Endoscopy;  Laterality: N/A;      Prior to Admission medications   Medication Sig Start Date End Date Taking? Authorizing Provider  albuterol (PROAIR HFA) 108 (90 Base) MCG/ACT inhaler INHALE 2 PUFFS INTO THE LUNGS EVERY 4 HOURS AS NEEDED FOR SHORTNESS OF BREATH 10/22/15  Yes Marletta Lor, MD  albuterol (PROVENTIL) (2.5 MG/3ML) 0.083% nebulizer solution INHALE THE CONTENTS OF 1 VIAL VIA NEBULIZER 4 TIMES A DAY. 11/08/15  Yes Brand Males, MD  beclomethasone (QVAR) 80 MCG/ACT inhaler Inhale 2 puffs  into the lungs 2 (two) times daily. 12/01/14  Yes Brand Males, MD  CRESTOR 20 MG tablet Take 1 tablet (20 mg total) by mouth every other day. 10/22/15  Yes Marletta Lor, MD  escitalopram (LEXAPRO) 10 MG tablet Take 1 tablet (10 mg total) by mouth daily. 10/22/15  Yes Marletta Lor, MD  Ferrous Sulfate Dried 200 (65 FE) MG TABS 1 tablet by mouth twice daily 04/24/15  Yes Marletta Lor, MD  fluticasone [facility]) 50 MCG/ACT nasal spray PLACE 2 SPRAYS INTO BOTH NOSTRILS DAILY. 07/06/15  Yes Marletta Lor, MD  furosemide (LASIX) 40 MG tablet TAKE 1 TABLET (40 MG TOTAL) BY MOUTH DAILY. Patient taking differently: Take 40 mg by mouth daily as needed for fluid.  03/01/13  Yes Marletta Lor, MD  ibandronate (BONIVA) 150 MG tablet TAKE IN THE MORNING WITH A FULL GLASS OF WATER, ON AN EMPTY STOMACH ONCE MONTHLY. 05/14/15  Yes Marletta Lor, MD  ipratropium (ATROVENT) 0.02 % nebulizer solution INHALE THE CONTENTS OF 1 VIAL VIA NEBULIZER 4 TIMES A DAY. 08/13/15  Yes Brand Males, MD  losartan (COZAAR) 25 MG tablet Take 1 tablet (25 mg total) by mouth daily. 10/22/15  Yes Marletta Lor, MD  omeprazole (PRILOSEC) 20 MG capsule TAKE 1 CAPSULE BY MOUTH ONCE DAILY. 04/26/15  Yes Brand Males, MD  polyethylene glycol powder (GLYCOLAX/MIRALAX) powder Take 17 g by mouth 2 (two) times daily as needed. Patient taking differently: Take 17 g by mouth daily.  08/30/14  Yes Marletta Lor, MD  potassium chloride SA (K-DUR,KLOR-CON) 20 MEQ tablet TAKE 1 TABLET BY MOUTH DAILY. 11/22/15  Yes Marletta Lor, MD  sodium chloride (AYR) 0.65 % nasal spray Place 1 spray into the nose as needed for congestion.   Yes Historical Provider, MD  traZODone (DESYREL) 100 MG tablet Take 1 tablet (100 mg total) by mouth at bedtime. 10/22/15  Yes Marletta Lor, MD  azithromycin (ZITHROMAX Z-PAK) 250 MG tablet Take as directed 11/27/15   Brand Males, MD  clotrimazole-betamethasone  (LOTRISONE) cream Apply 1 application topically 2 (two) times daily. Patient not taking: Reported on 11/28/2015 08/10/14   Mora Bellman, MD  conjugated estrogens (PREMARIN) vaginal cream Place 1 Applicatorful vaginally daily. Patient not taking: Reported on 11/28/2015 08/10/14   Mora Bellman, MD  OXYGEN Inhale 4 L/min into the lungs continuous.    Historical Provider, MD  predniSONE (DELTASONE) 10 MG tablet 40 mg daily x 2 days, then 20mg  daily x 2 days, then 10mg  daily x 2 days, then 5mg  daily x 2 days and stop Patient not taking: Reported on 11/28/2015 09/04/15   Brand Males, MD  predniSONE (DELTASONE) 10 MG tablet 40mg X2 days, 30mg  X2 days, 20mg  X2 days, 10mg X2 days, then stop. Patient not taking: Reported on 11/28/2015 10/18/15   Deneise Lever, MD  predniSONE (DELTASONE) 10 MG tablet 40 mg x1 day, then 30  mg x1 day, then 20 mg x1 day, then 10 mg x1 day, and then 5 mg x1 day and stop 11/27/15   Brand Males, MD     No Known Allergies  Social History:  reports that she quit smoking about 13 years ago. Her smoking use included Cigarettes. She has a 60 pack-year smoking history. She has never used smokeless tobacco. She reports that she does not drink alcohol or use illicit drugs.    Family History  Problem Relation Age of Onset  . Heart disease Mother     MI  . Diabetes Mother   . Uterine cancer Mother   . Anemia Mother   . Hypertension Mother   . Stomach cancer Father   . Esophageal cancer Father   . Hypertension Father   . Diabetes Sister   . Anemia Sister   . Esophageal cancer Brother   . Hypertension Brother   . Diabetes Son   . Diabetes Son   . Brain cancer Brother   . Lung cancer Sister   . Liver cancer Sister   . Colon cancer Neg Hx   . Rectal cancer Neg Hx        Physical Exam:  GEN:  Pleasant Elderly Thin 75 y.o. Caucasian female examined and in no acute distress; cooperative with exam Filed Vitals:   11/28/15 1734 11/28/15 1747  BP:  131/100  Pulse:  78    Temp:  98.3 F (36.8 C)  TempSrc:  Oral  Resp:  18  SpO2: 98% 96%   Blood pressure 131/100, pulse 78, temperature 98.3 F (36.8 C), temperature source Oral, resp. rate 18, SpO2 96 %. PSYCH: She is alert and oriented x4; does not appear anxious does not appear depressed; affect is normal HEENT: Normocephalic and Atraumatic, Mucous membranes pink; PERRLA; EOM intact; Fundi:  Benign;  No scleral icterus, Nares: Patent, Oropharynx: Clear, Edentulous,    Neck:  FROM, No Cervical Lymphadenopathy nor Thyromegaly or Carotid Bruit; No JVD; Breasts:: Not examined CHEST WALL: No tenderness CHEST: Normal respiration, clear to auscultation bilaterally HEART: Regular rate and rhythm; no murmurs rubs or gallops BACK: No kyphosis or scoliosis; No CVA tenderness ABDOMEN: Positive Bowel Sounds, Soft Non-Tender, No Rebound or Guarding; No Masses, No Organomegaly. Rectal Exam: Not done EXTREMITIES: No Cyanosis, Clubbing, or Edema; No Ulcerations. Genitalia: not examined PULSES: 2+ and symmetric SKIN: Normal hydration no rash or ulceration CNS:  Alert and Oriented x 4, No Focal Deficits Vascular: pulses palpable throughout    Labs on Admission:  Basic Metabolic Panel:  Recent Labs Lab 11/28/15 1834  NA 141  K 2.6*  CL 99*  CO2 29  GLUCOSE 94  BUN 12  CREATININE 1.24*  CALCIUM 7.5*   Liver Function Tests:  Recent Labs Lab 11/28/15 1834  AST 20  ALT 10*  ALKPHOS 75  BILITOT 0.3  PROT 7.1  ALBUMIN 2.4*   No results for input(s): LIPASE, AMYLASE in the last 168 hours. No results for input(s): AMMONIA in the last 168 hours. CBC:  Recent Labs Lab 11/28/15 1834  WBC 8.6  NEUTROABS 5.0  HGB 6.9*  HCT 23.5*  MCV 77.8*  PLT 465*   Cardiac Enzymes: No results for input(s): CKTOTAL, CKMB, CKMBINDEX, TROPONINI in the last 168 hours.  BNP (last 3 results) No results for input(s): BNP in the last 8760 hours.  ProBNP (last 3 results) No results for input(s): PROBNP in the last  8760 hours.  CBG: No results for input(s): GLUCAP in the  last 168 hours.  Radiological Exams on Admission: Dg Foot Complete Left  11/28/2015  CLINICAL DATA:  75 year old acute onset of left foot swelling and rash which began earlier today. No known injuries. EXAM: LEFT FOOT - COMPLETE 3+ VIEW COMPARISON:  None. FINDINGS: Severe generalized osseous demineralization. Possible avulsion fracture of indeterminate age arising from the dorsal aspect of the navicular bone. No fractures elsewhere. Diffuse soft tissue swelling. Well preserved joint spaces. IMPRESSION: 1. Possible avulsion fracture of indeterminate age arising from the dorsal aspect of the navicular bone. Please correlate with point tenderness. 2. No fractures elsewhere. 3. Severe generalized osseous demineralization. Electronically Signed   By: Evangeline Dakin M.D.   On: 11/28/2015 18:40     EKG: Independently reviewed.         Assessment/Plan:      75 y.o. female with  Principal Problem:    Anemia due to other cause   Monitor H/Hs   Transfusing 1 unit of PRBCs   Hematology Consult in AM   Active Problems:    Dermatitis- Macular, and Non-Blanching   IV Pepcid   Monitor    May Need Dermatology consult      Essential hypertension   Continue lasix and Losartan Rx   Monior BPs      Osteoporosis   On Boniva, had dose yesterday      COPD, severe (HCC)   DuoNebs PRN   Continue NCO2   Monitor O2 sats   Pressure Ulcer   Wound Care eval in AM for Care     Chronic Fracture Left Foot   Pain Control PRN    DVT Prophylaxis   SCDs      Code Status:     FULL CODE      Family Communication:   No Family Present    Disposition Plan:  Observation Status        Time spent:  56 Minutes      Theressa Millard [facility] Pager [phone]   If 7AM -7PM Please Contact the Day Rounding Team MD for [facility]  If 7PM-7AM, Please Contact Night-Floor Coverage  [email or web address] Password [facility] 11/28/2015, 8:46  PM     ADDENDUM:   Patient was seen and examined on 11/28/2015

## 2015-11-28 NOTE — ED Notes (Signed)
Bed: [id number] Expected date:  Expected time:  Means of arrival:  Comments: 74yo/hives/rash

## 2015-11-28 NOTE — ED Notes (Signed)
Patient presents to [facility] via [facility] for complaints of a macular rash that appeared over her arms, trunk legs, and feet this morning. She states that her feet burn and itch. No blisters or pustules apparent and rash does not appear to follow dermatomes. No fever.

## 2015-11-28 NOTE — Telephone Encounter (Signed)
I called and spoke with the pt and notified of recs per MR  She verbalized understanding  Nothing further needed

## 2015-11-29 DIAGNOSIS — I10 Essential (primary) hypertension: Secondary | ICD-10-CM | POA: Diagnosis not present

## 2015-11-29 DIAGNOSIS — L8992 Pressure ulcer of unspecified site, stage 2: Secondary | ICD-10-CM | POA: Diagnosis present

## 2015-11-29 DIAGNOSIS — D509 Iron deficiency anemia, unspecified: Secondary | ICD-10-CM | POA: Diagnosis not present

## 2015-11-29 DIAGNOSIS — J449 Chronic obstructive pulmonary disease, unspecified: Secondary | ICD-10-CM | POA: Diagnosis not present

## 2015-11-29 DIAGNOSIS — D6489 Other specified anemias: Secondary | ICD-10-CM | POA: Diagnosis not present

## 2015-11-29 LAB — BASIC METABOLIC PANEL
ANION GAP: 9 (ref 5–15)
BUN: 12 mg/dL (ref 6–20)
CHLORIDE: 102 mmol/L (ref 101–111)
CO2: 30 mmol/L (ref 22–32)
CREATININE: 1.21 mg/dL — AB (ref 0.44–1.00)
Calcium: 7.2 mg/dL — ABNORMAL LOW (ref 8.9–10.3)
GFR calc non Af Amer: 44 mL/min — ABNORMAL LOW (ref >60)
GFR, EST AFRICAN AMERICAN: 51 mL/min — AB (ref >60)
Glucose, Bld: 87 mg/dL (ref 65–99)
Potassium: 3.5 mmol/L (ref 3.5–5.1)
Sodium: 141 mmol/L (ref 135–145)

## 2015-11-29 LAB — CBC
HEMATOCRIT: 24.6 % — AB (ref 36.0–46.0)
HEMOGLOBIN: 7.4 g/dL — AB (ref 12.0–15.0)
MCH: 23.2 pg — AB (ref 26.0–34.0)
MCHC: 30.1 g/dL (ref 30.0–36.0)
MCV: 77.1 fL — AB (ref 78.0–100.0)
Platelets: ADEQUATE 10*3/uL (ref 150–400)
RBC: 3.19 MIL/uL — ABNORMAL LOW (ref 3.87–5.11)
RDW: 18.6 % — ABNORMAL HIGH (ref 11.5–15.5)
WBC: 8.3 10*3/uL (ref 4.0–10.5)

## 2015-11-29 LAB — PREPARE RBC (CROSSMATCH)

## 2015-11-29 MED ORDER — DIPHENHYDRAMINE HCL 50 MG/ML IJ SOLN: 25.0000 mg | Freq: Four times a day (QID) | INTRAMUSCULAR | Status: DC | PRN

## 2015-11-29 MED ORDER — SODIUM CHLORIDE 0.9 % IV SOLN
Freq: Once | INTRAVENOUS | Status: AC
  Administered 2015-11-29: 14:00:00 via INTRAVENOUS

## 2015-11-29 MED ORDER — CETYLPYRIDINIUM CHLORIDE 0.05 % MT LIQD
7.0000 mL | Freq: Two times a day (BID) | OROMUCOSAL | Status: DC
  Administered 2015-11-29 – 2015-11-30 (×4): 7 mL via OROMUCOSAL

## 2015-11-29 MED ORDER — FAMOTIDINE IN NACL 20-0.9 MG/50ML-% IV SOLN
20.0000 mg | Freq: Two times a day (BID) | INTRAVENOUS | Status: DC
  Administered 2015-11-29 – 2015-11-30 (×3): 20 mg via INTRAVENOUS
  Filled 2015-11-29 (×4): qty 50

## 2015-11-29 MED ORDER — FUROSEMIDE 10 MG/ML IJ SOLN
20.0000 mg | Freq: Once | INTRAMUSCULAR | Status: AC
  Administered 2015-11-29: 20 mg via INTRAVENOUS
  Filled 2015-11-29: qty 2

## 2015-11-29 MED ORDER — PHYTONADIONE 5 MG PO TABS
10.0000 mg | ORAL_TABLET | Freq: Every day | ORAL | Status: AC
  Administered 2015-11-29 – 2015-11-30 (×2): 10 mg via ORAL
  Filled 2015-11-29 (×3): qty 2

## 2015-11-29 NOTE — Progress Notes (Signed)
PROGRESS NOTE    Pamela Hampton  [id number] DOB: 1940-08-30 DOA: 11/28/2015 PCP: Nyoka Cowden, MD  Outpatient Specialists: Leanora Ivanoff, Lavonna Rua Brief Narrative: Pamela Hampton is a 75 y.o. female with a history of COPD on 3 liters NCO2 Continuously, CAD, HTN, and Remote History of Vaginal Cancer S/P Chemo Rx, Radiation and Radiation Implants who presents to the ED with complaints of a rash on her legs spreading upward on her body x 2-3 days. She denies any pruritis, and denies any fevers or chills. She reports having burning in both of her feet and increased pain in her left foot. An x-ray of the left Foot revealed an age indeternimant navicular fracture. Her lab studies reveaeld a hemoglobin level of 6.9 wehereas she had previously had a hemoglobin in the 8's.  Assessment & Plan:  1. Acute on chronic Iron defi anemia -due to chronic blood loss -EGD in 3/13 with Barretts Esophagus, Capsule endo in 2010 with multiple small bowel AVMs, COlonoscopy 2010 with mild diverticulosis only -no overt active bleed, heme negative now -last transfusion was 1 year ago, i do not suspect hemolysis, LDH, BILI are WNL -s/p 1 unit PRBC, will give another unit today and IV Iron tomorrow -will need Regular CBC check and PRN transfusion and Iron  2. Rash/scabs -etiology unclear, 3 small scabs on RLE, suspect related to itching and skin tear, no new lesions  3. COPD/CHronic resp failure -stable, nebs PRN, uses 3-4L at Home  4. History of Vaginal Cancer S/P Chemo Rx, Radiation   5. CKD 2 -stable, monitor  6. Chronic Fracture Possible avulsion fracture of indeterminate age arising from the dorsal aspect of the navicular bone -FU with ORtho  DVT prophylaxis: SCDs Code Status:Full COde Family Communication: grand daughter at bedside Disposition Plan: Home tomorrow if stable   Consultants:    Procedures:    Antimicrobials:  Subjective: Feels ok, breathing close to  baseline  Objective: Filed Vitals:   11/29/15 0604 11/29/15 0845 11/29/15 0847 11/29/15 1117  BP: 151/64     Pulse: 80     Temp: 98.3 F (36.8 C)     TempSrc: Oral     Resp: 20     Height:      Weight:      SpO2: 100% 97% 97% 98%    Intake/Output Summary (Last 24 hours) at 11/29/15 1145 Last data filed at 11/29/15 0354  Gross per 24 hour  Intake    340 ml  Output      0 ml  Net    340 ml   Filed Weights   11/28/15 2312  Weight: 75.9 kg (167 lb 5.3 oz)    Examination:  General exam: Appears calm and comfortable, no distress Respiratory system: Clear to auscultation. Respiratory effort normal. Cardiovascular system: S1 & S2 heard, RRR. No JVD, murmurs, rubs, gallops or clicks. No pedal edema. Gastrointestinal system: Abdomen is nondistended, soft and nontender. No organomegaly or masses felt. Normal bowel sounds  Central nervous system: Alert and oriented. No focal neurological deficits. Extremities: Symmetric 5 x 5 power. Skin: No rashes, lesions or ulcers Psychiatry: Judgement and insight appear normal. Mood & affect appropriate.     Data Reviewed: I have personally reviewed following labs and imaging studies  CBC:  Recent Labs Lab 11/28/15 1834 11/29/15 0500  WBC 8.6 8.3  NEUTROABS 5.0  --   HGB 6.9* 7.4*  HCT 23.5* 24.6*  MCV 77.8* 77.1*  PLT 465* PLATELET CLUMPS NOTED ON SMEAR, COUNT  APPEARS ADEQUATE   Basic Metabolic Panel:  Recent Labs Lab 11/28/15 1834 11/29/15 0500  NA 141 141  K 2.6* 3.5  CL 99* 102  CO2 29 30  GLUCOSE 94 87  BUN 12 12  CREATININE 1.24* 1.21*  CALCIUM 7.5* 7.2*  MG 1.2*  --    GFR: Estimated Creatinine Clearance: 41.9 mL/min (by C-G formula based on Cr of 1.21). Liver Function Tests:  Recent Labs Lab 11/28/15 1834  AST 20  ALT 10*  ALKPHOS 75  BILITOT 0.3  PROT 7.1  ALBUMIN 2.4*   No results for input(s): LIPASE, AMYLASE in the last 168 hours. No results for input(s): AMMONIA in the last 168  hours. Coagulation Profile:  Recent Labs Lab 11/28/15 1834  INR 1.24   Cardiac Enzymes: No results for input(s): CKTOTAL, CKMB, CKMBINDEX, TROPONINI in the last 168 hours. BNP (last 3 results) No results for input(s): PROBNP in the last 8760 hours. HbA1C: No results for input(s): HGBA1C in the last 72 hours. CBG: No results for input(s): GLUCAP in the last 168 hours. Lipid Profile: No results for input(s): CHOL, HDL, LDLCALC, TRIG, CHOLHDL, LDLDIRECT in the last 72 hours. Thyroid Function Tests: No results for input(s): TSH, T4TOTAL, FREET4, T3FREE, THYROIDAB in the last 72 hours. Anemia Panel:  Recent Labs  11/28/15 1947  VITAMINB12 1012*  FOLATE 8.5  FERRITIN 18  TIBC 269  IRON 10*  RETICCTPCT 4.4*   Urine analysis:    Component Value Date/Time   COLORURINE YELLOW 11/28/2015 2047   APPEARANCEUR CLOUDY* 11/28/2015 2047   LABSPEC 1.007 11/28/2015 2047   PHURINE 6.0 11/28/2015 2047   GLUCOSEU NEGATIVE 11/28/2015 2047   HGBUR SMALL* 11/28/2015 2047   HGBUR small 04/24/2010 1002   BILIRUBINUR NEGATIVE 11/28/2015 2047   BILIRUBINUR negative 06/26/2014 [place] 11/28/2015 2047   PROTEINUR NEGATIVE 11/28/2015 2047   PROTEINUR trace 06/26/2014 1633   UROBILINOGEN 0.2 06/26/2014 1633   UROBILINOGEN 0.2 04/24/2010 1002   NITRITE NEGATIVE 11/28/2015 2047   NITRITE negative 06/26/2014 1633   LEUKOCYTESUR LARGE* 11/28/2015 2047   Sepsis Labs: @LABRCNTIP (procalcitonin:4,lacticidven:4)  )No results found for this or any previous visit (from the past 240 hour(s)).       Radiology Studies: Dg Foot Complete Left  11/28/2015  CLINICAL DATA:  75 year old acute onset of left foot swelling and rash which began earlier today. No known injuries. EXAM: LEFT FOOT - COMPLETE 3+ VIEW COMPARISON:  None. FINDINGS: Severe generalized osseous demineralization. Possible avulsion fracture of indeterminate age arising from the dorsal aspect of the navicular bone. No  fractures elsewhere. Diffuse soft tissue swelling. Well preserved joint spaces. IMPRESSION: 1. Possible avulsion fracture of indeterminate age arising from the dorsal aspect of the navicular bone. Please correlate with point tenderness. 2. No fractures elsewhere. 3. Severe generalized osseous demineralization. Electronically Signed   By: Evangeline Dakin M.D.   On: 11/28/2015 18:40        Scheduled Meds: . sodium chloride   Intravenous STAT  . antiseptic oral rinse  7 mL Mouth Rinse BID  . budesonide (PULMICORT) nebulizer solution  0.25 mg Nebulization BID  . escitalopram  10 mg Oral Daily  . famotidine (PEPCID) IV  20 mg Intravenous Q12H  . ipratropium-albuterol  3 mL Nebulization QID  . potassium chloride SA  20 mEq Oral Daily  . [START ON 11/30/2015] rosuvastatin  20 mg Oral QODAY  . sodium chloride flush  3 mL Intravenous Q12H  . traZODone  100 mg Oral QHS  Continuous Infusions:    LOS: 1 day    Time spent: 33min    Domenic Polite, MD [facility] Pager [phone]  If 7PM-7AM, please contact night-coverage [email or web address] Password [email or web address] 11/29/2015, 11:45 AM

## 2015-11-29 NOTE — Progress Notes (Signed)
[facility] CONSULT NOTE  Patient Care Team: Marletta Lor, MD as PCP - General (Internal Medicine)  CHIEF COMPLAINTS/PURPOSE OF CONSULTATION:  Anemia, bruising  HISTORY OF PRESENTING ILLNESS:  Pamela Hampton 75 y.o. female was admitted after presentation with weakness and diffuse bruising.  This patient has significant major comorbidities including end-stage COPD on chronic oxygen therapy, history of GYN malignancy treated with chemotherapy and radiation therapy, and Barrett's esophagus on surveillance endoscopy on the regular basis. Her last EGD from May 2016 show Cameron's erosion and minimum streaking of blood with persistent hiatal hernia and Barrett's esophagus without dysplasia.- She denies taking regular NSAID. The patient denies any recent signs or symptoms of bleeding such as spontaneous epistaxis, hematuria or hematochezia. The patient is noted to have progressive microcytic anemia. Her presentation last night, her hemoglobin was down to 6.9. The patient also complained of excessive bruising. She complain of chronic shortness of breath on minimal exertion and is on chronic oxygen therapy. She had been on chronic intermittent doses of prednisone for COPD exacerbation. She had received blood transfusion The patient was prescribed oral iron supplements She complained of poor appetite and has lost some weight recently. She denies changes in her bowel habits. In the emergency room, she was also found to have mildly elevated serum creatinine and low albumin. Anemia panel came back consistent with iron deficiency anemia with relatively lack of reticulocytosis. APTT and PT were mildly elevated.  MEDICAL HISTORY:  Past Medical History  Diagnosis Date  . Allergy     Rhinitis  . Depression   . GERD (gastroesophageal reflux disease)     Barrett's esophagus  . Hyperlipidemia   . Hypertension   . Osteoporosis   . Seizures ([place])   . Barrett's esophagus 07/19/2004   . Anemia   . Obesity   . Pneumonia   . Stroke ([place])   . Sleep apnea   . Hepatitis   . Arthritis   . Blood transfusion   . COPD (chronic obstructive pulmonary disease) ([facility])     wears 3 liters of o2 continious  . Emphysema of lung ([place])   . Ulcer   . Hiatal hernia   . Vaginal cancer ([place])     radiation implant '97. radiation. chemo.  . Impaired hearing   . History of oxygen administration     oxygen concentrator @ 4 l/m nasally 24/ 7    SURGICAL HISTORY: Past Surgical History  Procedure Laterality Date  . Orif hip fracture Right   . Cholecystectomy    . Hemorrhoid surgery    . Total hip arthroplasty Right   . Abdominal hysterectomy    . 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 Abdominal laproscopy surgery    . Joint replacement    . Esophagogastroduodenoscopy (egd) with propofol N/A 12/21/2014    Procedure: ESOPHAGOGASTRODUODENOSCOPY (EGD) WITH PROPOFOL;  Surgeon: Inda Castle, MD;  Location: [facility];  Service: Endoscopy;  Laterality: N/A;    SOCIAL HISTORY: Social History   Social History  . Marital Status: Divorced    Spouse Name: N/A  . Number of Children: 4  . Years of Education: N/A   Occupational History  . disaled    Social History Main Topics  . Smoking status: Former Smoker -- 2.00 packs/day for 30 years    Types: Cigarettes    Quit date: 07/28/2002  . Smokeless tobacco: Never Used  . Alcohol Use: No  .  Drug Use: No  . Sexual Activity: Not Currently   Other Topics Concern  . Not on file   Social History Narrative    FAMILY HISTORY: Family History  Problem Relation Age of Onset  . Heart disease Mother     MI  . Diabetes Mother   . Uterine cancer Mother   . Anemia Mother   . Hypertension Mother   . Stomach cancer Father   . Esophageal cancer Father   . Hypertension Father   . Diabetes Sister   . Anemia Sister   . Esophageal cancer Brother   . Hypertension Brother   .  Diabetes Son   . Diabetes Son   . Brain cancer Brother   . Lung cancer Sister   . Liver cancer Sister   . Colon cancer Neg Hx   . Rectal cancer Neg Hx     ALLERGIES:  has No Known Allergies.  MEDICATIONS:  Current Facility-Administered Medications  Medication Dose Route Frequency Provider Last Rate Last Dose  . 0.9 %  sodium chloride infusion   Intravenous Once Domenic Polite, MD      . acetaminophen (TYLENOL) tablet 650 mg  650 mg Oral Q6H PRN Theressa Millard, MD   650 mg at 11/29/15 0206   Or  . acetaminophen (TYLENOL) suppository 650 mg  650 mg Rectal Q6H PRN Theressa Millard, MD      . antiseptic oral rinse (CPC / CETYLPYRIDINIUM CHLORIDE 0.05%) solution 7 mL  7 mL Mouth Rinse BID Theressa Millard, MD   7 mL at 11/29/15 1000  . budesonide (PULMICORT) nebulizer solution 0.25 mg  0.25 mg Nebulization BID Theressa Millard, MD   0.25 mg at 11/29/15 0843  . diphenhydrAMINE (BENADRYL) injection 25 mg  25 mg Intravenous Q6H PRN Theressa Millard, MD      . escitalopram (LEXAPRO) tablet 10 mg  10 mg Oral Daily Theressa Millard, MD   10 mg at 11/29/15 0905  . famotidine (PEPCID) IVPB 20 mg premix  20 mg Intravenous Q12H Theressa Millard, MD   20 mg at 11/29/15 0555  . furosemide (LASIX) injection 20 mg  20 mg Intravenous Once Domenic Polite, MD      . HYDROmorphone (DILAUDID) injection 0.5-1 mg  0.5-1 mg Intravenous Q3H PRN Theressa Millard, MD      . ipratropium-albuterol (DUONEB) 0.5-2.5 (3) MG/3ML nebulizer solution 3 mL  3 mL Nebulization QID Theressa Millard, MD   3 mL at 11/29/15 1116  . ondansetron (ZOFRAN) tablet 4 mg  4 mg Oral Q6H PRN Theressa Millard, MD       Or  . ondansetron (ZOFRAN) injection 4 mg  4 mg Intravenous Q6H PRN Theressa Millard, MD      . oxyCODONE (Oxy IR/ROXICODONE) immediate release tablet 5 mg  5 mg Oral Q4H PRN Theressa Millard, MD      . potassium chloride SA (K-DUR,KLOR-CON) CR tablet 20 mEq  20 mEq Oral Daily Theressa Millard, MD    20 mEq at 11/29/15 0905  . [START ON 11/30/2015] rosuvastatin (CRESTOR) tablet 20 mg  20 mg Oral QODAY Harvette Evonnie Dawes, MD      . traZODone (DESYREL) tablet 100 mg  100 mg Oral QHS Theressa Millard, MD   100 mg at 11/29/15 0043    REVIEW OF SYSTEMS:   Constitutional: Denies fevers, chills or abnormal night sweats Eyes: Denies blurriness of vision, double vision or watery eyes Ears, nose,  mouth, throat, and face: Denies mucositis or sore throat Respiratory: Denies cough, dyspnea or wheezes Cardiovascular: Denies palpitation, chest discomfort or lower extremity swelling Skin: Denies abnormal skin rashes Lymphatics: Denies new lymphadenopathy Neurological:Denies numbness, tingling or new weaknesses Behavioral/Psych: Mood is stable, no new changes  All other systems were reviewed with the patient and are negative.  PHYSICAL EXAMINATION: ECOG PERFORMANCE STATUS: 2 - Symptomatic, <50% confined to bed  Filed Vitals:   11/29/15 0354 11/29/15 0604  BP: 149/55 151/64  Pulse: 76 80  Temp: 98 F (36.7 C) 98.3 F (36.8 C)  Resp: 20 20   Filed Weights   11/28/15 2312  Weight: 167 lb 5.3 oz (75.9 kg)    GENERAL:alert, no distress and comfortable. She is morbidly obese and pale SKIN: Noted mild rash as well as bruising  EYES: normal, conjunctiva are pale and non-injected, sclera clear OROPHARYNX:no exudate, no erythema and lips, buccal mucosa, and tongue normal  NECK: supple, thyroid normal size, non-tender, without nodularity LYMPH:  no palpable lymphadenopathy in the cervical, axillary or inguinal LUNGS: clear to auscultation and percussion with normal breathing effort HEART: regular rate & rhythm and no murmurs and no lower extremity edema ABDOMEN:abdomen soft, with mild epigastric discomfort, non-tender and normal bowel sounds Musculoskeletal:no cyanosis of digits and no clubbing  PSYCH: alert & oriented x 3 with fluent speech NEURO: no focal motor/sensory deficits  RADIOGRAPHIC  STUDIES: I have personally reviewed the radiological images as listed and agreed with the findings in the report. Dg Foot Complete Left  11/28/2015  CLINICAL DATA:  75 year old acute onset of left foot swelling and rash which began earlier today. No known injuries. EXAM: LEFT FOOT - COMPLETE 3+ VIEW COMPARISON:  None. FINDINGS: Severe generalized osseous demineralization. Possible avulsion fracture of indeterminate age arising from the dorsal aspect of the navicular bone. No fractures elsewhere. Diffuse soft tissue swelling. Well preserved joint spaces. IMPRESSION: 1. Possible avulsion fracture of indeterminate age arising from the dorsal aspect of the navicular bone. Please correlate with point tenderness. 2. No fractures elsewhere. 3. Severe generalized osseous demineralization. Electronically Signed   By: Evangeline Dakin M.D.   On: 11/28/2015 18:40    ASSESSMENT & PLAN:   Acute on chronic microcytic anemia and iron deficiency, likely due to chronic GI bleed The patient likely has recurrent GI bleed until proven otherwise I have discussed her case with Hospitalist. She will receive blood transfusion along with IV iron She will need close follow-up in the outpatient clinic for possible future intermittent iron infusion  Barrett's esophagus on surveillance with chronic reflux The patient is receiving Pepcid IV. She will need long-term PPI and close follow-up with GI  Increased bruising, mildly abnormal coagulation profile The pattern of coagulopathy is highly suggestive of possible vitamin K deficiency I would recommend a trial of oral vitamin K supplement and defer mixing study to the future. If her coagulation profile improve with oral vitamin K supplement, no further workup is necessary  Protein calorie malnutrition due to recent poor appetite Recommend dietitian consult  Mild acute renal failure likely secondary to hypoperfusion from anemia  Should improve with hydration and blood  transfusion.  Chronic COPD on oxygen therapy  Continue oxygen therapy and COPD management  Remote history of GYN malignancy She has close follow-up at the women's clinic. Her last workup a year ago was negative for recurrence  Discharge planning  If her blood count improves and the patient is stable tomorrow, she can be discharged with close follow-up in  the outpatient clinic  I plan to see her again in the morning to discuss future follow-up and review test results.   All questions were answered. The patient knows to call the clinic with any problems, questions or concerns.    [facility], [place], MD 11/29/2015   12:36 PM

## 2015-11-29 NOTE — Progress Notes (Signed)
Patient BP 166/74.  Made night coverage aware per protocol.  Will continue to monitor closely.

## 2015-11-29 NOTE — Progress Notes (Addendum)
CCMD called and reported patient had a non-sustained burst of SVT in the 150's.  Patient asymptomatic.  MD made aware.  Will continue to monitor closely.

## 2015-11-30 ENCOUNTER — Telehealth: Payer: Self-pay | Admitting: Hematology and Oncology

## 2015-11-30 DIAGNOSIS — D6489 Other specified anemias: Secondary | ICD-10-CM | POA: Diagnosis not present

## 2015-11-30 DIAGNOSIS — D509 Iron deficiency anemia, unspecified: Secondary | ICD-10-CM | POA: Diagnosis not present

## 2015-11-30 DIAGNOSIS — I10 Essential (primary) hypertension: Secondary | ICD-10-CM | POA: Diagnosis not present

## 2015-11-30 DIAGNOSIS — J449 Chronic obstructive pulmonary disease, unspecified: Secondary | ICD-10-CM | POA: Diagnosis not present

## 2015-11-30 LAB — BASIC METABOLIC PANEL
Anion gap: 10 (ref 5–15)
BUN: 10 mg/dL (ref 6–20)
CALCIUM: 7.1 mg/dL — AB (ref 8.9–10.3)
CO2: 36 mmol/L — AB (ref 22–32)
CREATININE: 1.24 mg/dL — AB (ref 0.44–1.00)
Chloride: 99 mmol/L — ABNORMAL LOW (ref 101–111)
GFR calc Af Amer: 49 mL/min — ABNORMAL LOW (ref >60)
GFR calc non Af Amer: 42 mL/min — ABNORMAL LOW (ref >60)
GLUCOSE: 95 mg/dL (ref 65–99)
Potassium: 3.3 mmol/L — ABNORMAL LOW (ref 3.5–5.1)
Sodium: 145 mmol/L (ref 135–145)

## 2015-11-30 LAB — CBC
HEMATOCRIT: 28.6 % — AB (ref 36.0–46.0)
Hemoglobin: 8.9 g/dL — ABNORMAL LOW (ref 12.0–15.0)
MCH: 24.9 pg — AB (ref 26.0–34.0)
MCHC: 31.1 g/dL (ref 30.0–36.0)
MCV: 80.1 fL (ref 78.0–100.0)
Platelets: 386 10*3/uL (ref 150–400)
RBC: 3.57 MIL/uL — ABNORMAL LOW (ref 3.87–5.11)
RDW: 17.9 % — AB (ref 11.5–15.5)
WBC: 8.4 10*3/uL (ref 4.0–10.5)

## 2015-11-30 LAB — TYPE AND SCREEN
ABO/RH(D): O POS
Antibody Screen: NEGATIVE
UNIT DIVISION: 0
Unit division: 0

## 2015-11-30 LAB — APTT: APTT: 47 s — AB (ref 24–37)

## 2015-11-30 LAB — PROTIME-INR
INR: 1.21 (ref 0.00–1.49)
PROTHROMBIN TIME: 15 s (ref 11.6–15.2)

## 2015-11-30 LAB — HAPTOGLOBIN: HAPTOGLOBIN: 397 mg/dL — AB (ref 34–200)

## 2015-11-30 MED ORDER — FERUMOXYTOL INJECTION 510 MG/17 ML
510.0000 mg | Freq: Once | INTRAVENOUS | Status: AC
  Administered 2015-11-30: 510 mg via INTRAVENOUS
  Filled 2015-11-30: qty 17

## 2015-11-30 MED ORDER — FUROSEMIDE 40 MG PO TABS: 40.0000 mg | ORAL_TABLET | Freq: Every day | ORAL | Status: DC | PRN

## 2015-11-30 NOTE — Progress Notes (Signed)
Pamela Hampton   DOB:1940-12-30   [id number]    Subjective: The patient felt little better with more energy. She is distressed with the prospect of going back home as she found her living situation is getting harder to call  Objective:  Filed Vitals:   11/30/15 0159 11/30/15 0625  BP: 157/60 118/40  Pulse: 80 79  Temp: 98.9 F (37.2 C) 99.6 F (37.6 C)  Resp: 20 20     Intake/Output Summary (Last 24 hours) at 11/30/15 1025 Last data filed at 11/30/15 0455  Gross per 24 hour  Intake    565 ml  Output    300 ml  Net    265 ml    GENERAL:alert, no distress and comfortable SKIN: She has mild skin rashes. No petechiae.  Musculoskeletal:no cyanosis of digits and no clubbing  NEURO: alert & oriented x 3 with fluent speech, no focal motor/sensory deficits   Labs:  Lab Results  Component Value Date   WBC 8.4 11/30/2015   HGB 8.9* 11/30/2015   HCT 28.6* 11/30/2015   MCV 80.1 11/30/2015   PLT 386 11/30/2015   NEUTROABS 5.0 12-28-15    Lab Results  Component Value Date   NA 145 11/30/2015   K 3.3* 11/30/2015   CL 99* 11/30/2015   CO2 36* 11/30/2015    Studies:  Dg Foot Complete Left  December 28, 2015  CLINICAL DATA:  75 year old acute onset of left foot swelling and rash which began earlier today. No known injuries. EXAM: LEFT FOOT - COMPLETE 3+ VIEW COMPARISON:  None. FINDINGS: Severe generalized osseous demineralization. Possible avulsion fracture of indeterminate age arising from the dorsal aspect of the navicular bone. No fractures elsewhere. Diffuse soft tissue swelling. Well preserved joint spaces. IMPRESSION: 1. Possible avulsion fracture of indeterminate age arising from the dorsal aspect of the navicular bone. Please correlate with point tenderness. 2. No fractures elsewhere. 3. Severe generalized osseous demineralization. Electronically Signed   By: Evangeline Dakin M.D.   On: December 28, 2015 18:40    Assessment & Plan:  Acute on chronic microcytic anemia and iron  deficiency, likely due to chronic GI bleed The patient likely has recurrent GI bleed until proven otherwise I have discussed her case with Hospitalist. She will receive blood transfusion along with IV iron She will need close follow-up in the outpatient clinic for possible future intermittent iron infusion  Barrett's esophagus on surveillance with chronic reflux The patient is receiving Pepcid IV. She will need long-term PPI and close follow-up with GI  Increased bruising, mildly abnormal coagulation profile The pattern of coagulopathy is highly suggestive of possible vitamin K deficiency I would recommend a trial of oral vitamin K supplement and defer mixing study to the future.  Protein calorie malnutrition due to recent poor appetite Recommend dietitian consult  Mild acute renal failure likely secondary to hypoperfusion from anemia  Should improve with hydration and blood transfusion.  Chronic COPD on oxygen therapy  Continue oxygen therapy and COPD management  Remote history of GYN malignancy She has close follow-up at the women's clinic. Her last workup a year ago was negative for recurrence  Discharge planning  We have a long discussion today regarding discharge planning. I will consult care management to see if we can set up home health and home-based palliative care for her prior to discharge I will get my scheduler to coordinate follow-up appointment with her son who is the principal caregiver within next 2-3 weeks. I will sign off.   Farzana Koci, MD  11/30/2015  10:25 AM

## 2015-11-30 NOTE — Telephone Encounter (Signed)
Lt mess regarding hospital f/u

## 2015-11-30 NOTE — Care Management Obs Status (Signed)
[facility] NOTIFICATION   Patient Details  Name: Pamela Hampton MRN: [id number] Date of Birth: [birth year]   Medicare Observation Status Notification Given:  Yes    Purcell Mouton, RN 11/30/2015, 3:06 PM

## 2015-11-30 NOTE — Discharge Summary (Signed)
Physician Discharge Summary  Pamela Hampton [id number] DOB: Oct 04, 1940 DOA: 11/28/2015  PCP: Nyoka Cowden, MD  Admit date: 11/28/2015 Discharge date: 11/30/2015  Time spent: 45 minutes  Recommendations for Outpatient Follow-up:  1. PCP Dr.Kwiatowski in 1 week, please check CBC at FU 2. Dr.Gorsuch in 2 weeks, Iron transfusions PRN  Discharge Diagnoses:    Acute on chronic Iron deficiency anemia    Essential hypertension   Osteoporosis   COPD, severe ([place])   Dermatitis   Pressure ulcer stage II   Chronic GI Blood loss anemia  Discharge Condition: stable  Diet recommendation: low sodium  Filed Weights   11/28/15 2312  Weight: 75.9 kg (167 lb 5.3 oz)    History of present illness:  HPI: Pamela Hampton is a 75 y.o. female with a history of COPD on 3 liters NCO2 Continuously, CAD, HTN, and Remote History of Vaginal Cancer S/P Chemo Rx, Radiation and Radiation Implants who presents to the ED with complaints of a rash on her legs spreading upward on her body x 2-3 days. She denies any pruritis, and denies any fevers or chills. She reports having burning in both of her feet and increased pain in her left foot. An x-ray of the left Foot revealed an age indeternimant navicular fracture. Her lab studies revealed a hemoglobin level of 6.9 wehereas she had previously had a hemoglobin in the 8's.  Hospital Course:  1. 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 Rash/scabs -etiology unclear, 3 small scabs on  RLE, suspect related to itching and skin tear, no new lesions -monitor periodically, no purpura  3. COPD/CHronic resp failure -stable, nebs PRN, uses 3-4L at Home  4. History of Vaginal Cancer  -S/P Chemo Rx, Radiation   5. CKD 2 -stable, monitor  6. ?Chronic Fracture L foot Possible avulsion fracture of indeterminate age arising from the dorsal aspect of the navicular bone -FU with ORtho, no issues noted, would recommend repeat Imaging in 1-2weeks   Consultations:  Heme Dr.Gorsuch  Discharge Exam: Filed Vitals:   11/30/15 1052 11/30/15 1335  BP: 120/58 136/46  Pulse: 78 75  Temp: 98.9 F (37.2 C) 98.3 F (36.8 C)  Resp: 18 19    General: AAOx3 Cardiovascular: S1S2/RRR Respiratory: CTAB  Discharge Instructions   Discharge Instructions    Diet - low sodium heart healthy    Complete by:  As directed      Increase activity slowly    Complete by:  As directed           Current Discharge Medication List    CONTINUE these medications which have CHANGED   Details  furosemide (LASIX) 40 MG tablet Take 1 tablet (40 mg total) by mouth daily as needed for fluid.      CONTINUE these medications which have NOT CHANGED   Details  albuterol (PROAIR HFA) 108 (90 Base) MCG/ACT inhaler INHALE 2 PUFFS INTO THE LUNGS EVERY 4 HOURS AS NEEDED FOR SHORTNESS OF BREATH Qty: 8.5 g, Refills: 12  albuterol (PROVENTIL) (2.5 MG/3ML) 0.083% nebulizer solution INHALE THE CONTENTS OF 1 VIAL VIA NEBULIZER 4 TIMES A DAY. Qty: 360 mL, Refills: 2    beclomethasone (QVAR) 80 MCG/ACT inhaler Inhale 2 puffs into the lungs 2 (two) times daily. Qty: 8.7 g, Refills: 6    CRESTOR 20 MG tablet Take 1 tablet (20 mg total) by mouth every other day. Qty: 90 tablet, Refills: 1    escitalopram (LEXAPRO) 10 MG tablet Take 1 tablet (10 mg total) by mouth daily. Qty: 30 tablet, Refills: 5    Ferrous Sulfate Dried 200 (65 FE) MG TABS 1 tablet by mouth twice daily Qty: 30 tablet, Refills: 6     fluticasone (FLONASE) 50 MCG/ACT nasal spray PLACE 2 SPRAYS INTO BOTH NOSTRILS DAILY. Qty: 16 g, Refills: 5    ibandronate (BONIVA) 150 MG tablet TAKE IN THE MORNING WITH A FULL GLASS OF WATER, ON AN EMPTY STOMACH ONCE MONTHLY. Qty: 4 tablet, Refills: 3    ipratropium (ATROVENT) 0.02 % nebulizer solution INHALE THE CONTENTS OF 1 VIAL VIA NEBULIZER 4 TIMES A DAY. Qty: 300 mL, Refills: 6    omeprazole (PRILOSEC) 20 MG capsule TAKE 1 CAPSULE BY MOUTH ONCE DAILY. Qty: 30 capsule, Refills: 6    polyethylene glycol powder (GLYCOLAX/MIRALAX) powder Take 17 g by mouth 2 (two) times daily as needed. Qty: 3350 g, Refills: 1    potassium chloride SA (K-DUR,KLOR-CON) 20 MEQ tablet TAKE 1 TABLET BY MOUTH DAILY. Qty: 30 tablet, Refills: 5    sodium chloride (AYR) 0.65 % nasal spray Place 1 spray into the nose as needed for congestion.    traZODone (DESYREL) 100 MG tablet Take 1 tablet (100 mg total) by mouth at bedtime. Qty: 90 tablet, Refills: 1    OXYGEN Inhale 4 L/min into the lungs continuous.      STOP taking these medications     losartan (COZAAR) 25 MG tablet      azithromycin (ZITHROMAX Z-PAK) 250 MG tablet      clotrimazole-betamethasone (LOTRISONE) cream      conjugated estrogens (PREMARIN) vaginal cream      predniSONE (DELTASONE) 10 MG tablet      predniSONE (DELTASONE) 10 MG tablet      predniSONE (DELTASONE) 10 MG tablet        No Known Allergies Follow-up Information    Follow up with Nyoka Cowden, MD In 1 week.   Specialty:  Internal Medicine   Contact information:   [place] [id number] [phone]       Follow up with [facility], NI, MD In 2 weeks.   Specialty:  Hematology and Oncology   Why:  Office will call your son to    Contact information:   [place] [address] [phone]        The results of significant diagnostics from this hospitalization (including imaging, microbiology, ancillary  and laboratory) are listed below for reference.    Significant Diagnostic Studies: Dg Foot Complete Left  11/28/2015  CLINICAL DATA:  75 year old acute onset of left foot swelling and rash which began earlier today. No known injuries. EXAM: LEFT FOOT - COMPLETE 3+ VIEW COMPARISON:  None. FINDINGS: Severe generalized osseous demineralization. Possible avulsion fracture of indeterminate age arising from the dorsal aspect of the navicular bone. No fractures elsewhere. Diffuse soft tissue swelling. Well preserved joint spaces. IMPRESSION: 1. Possible avulsion fracture of indeterminate age arising from the dorsal aspect of the navicular bone. Please correlate with point tenderness.  2. No fractures elsewhere. 3. Severe generalized osseous demineralization. Electronically Signed   By: Evangeline Dakin M.D.   On: 11/28/2015 18:40    Microbiology: No results found for this or any previous visit (from the past 240 hour(s)).   Labs: Basic Metabolic Panel:  Recent Labs Lab 11/28/15 1834 11/29/15 0500 11/30/15 0538  NA 141 141 145  K 2.6* 3.5 3.3*  CL 99* 102 99*  CO2 29 30 36*  GLUCOSE 94 87 95  BUN 12 12 10   CREATININE 1.24* 1.21* 1.24*  CALCIUM 7.5* 7.2* 7.1*  MG 1.2*  --   --    Liver Function Tests:  Recent Labs Lab 11/28/15 1834  AST 20  ALT 10*  ALKPHOS 75  BILITOT 0.3  PROT 7.1  ALBUMIN 2.4*   No results for input(s): LIPASE, AMYLASE in the last 168 hours. No results for input(s): AMMONIA in the last 168 hours. CBC:  Recent Labs Lab 11/28/15 1834 11/29/15 0500 11/30/15 0538  WBC 8.6 8.3 8.4  NEUTROABS 5.0  --   --   HGB 6.9* 7.4* 8.9*  HCT 23.5* 24.6* 28.6*  MCV 77.8* 77.1* 80.1  PLT 465* PLATELET CLUMPS NOTED ON SMEAR, COUNT APPEARS ADEQUATE 386   Cardiac Enzymes: No results for input(s): CKTOTAL, CKMB, CKMBINDEX, TROPONINI in the last 168 hours. BNP: BNP (last 3 results) No results for input(s): BNP in the last 8760 hours.  ProBNP (last 3 results) No results  for input(s): PROBNP in the last 8760 hours.  CBG: No results for input(s): GLUCAP in the last 168 hours.     SignedDomenic Polite MD.  [facility] 11/30/2015, 2:46 PM

## 2015-11-30 NOTE — Progress Notes (Signed)
Pt selected [facility] for HH needs. Referral given to in house rep.  

## 2015-11-30 NOTE — Evaluation (Signed)
Physical Therapy Evaluation Patient Details Name: Pamela Hampton MRN: [id number] DOB: September 08, 1940 Today's Date: 11/30/2015   History of Present Illness  75 yo female admitted with anemia, xray-L foot fx-chronic.   Clinical Impression  On eval, pt required Min guard assist for mobility-walked ~60 feet with RW. O2 sats 89% on 3L O2 during ambulation. Recommend HHPT follow up.     Follow Up Recommendations Home health PT;Supervision - Intermittent    Equipment Recommendations  None recommended by PT    Recommendations for Other Services       Precautions / Restrictions Precautions Precautions: Fall Precaution Comments: O2 dep Restrictions Weight Bearing Restrictions: No      Mobility  Bed Mobility Overal bed mobility: Needs Assistance Bed Mobility: Supine to Sit     Supine to sit: Min guard     General bed mobility comments: close guard for safety  Transfers Overall transfer level: Needs assistance Equipment used: Rolling walker (2 wheeled) Transfers: Sit to/from Stand Sit to Stand: Min guard         General transfer comment: close guard for safety  Ambulation/Gait Ambulation/Gait assistance: Min guard Ambulation Distance (Feet): 60 Feet Assistive device: Rolling walker (2 wheeled) Gait Pattern/deviations: Step-through pattern;Decreased stride length     General Gait Details: close guard for safety. O2 sats 89% 3L O2 during ambulation.   Stairs            Wheelchair Mobility    Modified Rankin (Stroke Patients Only)       Balance                                             Pertinent Vitals/Pain Pain Assessment: No/denies pain    Home Living Family/patient expects to be discharged to:: Private residence Living Arrangements: Children Available Help at Discharge: Family Type of Home: Mobile home Home Access: Ramped entrance     [place]: One level [place]: Wheelchair - manual;Wheelchair - power;Walker - 2  wheels Additional Comments: oxygen-O2 dep    Prior Function Level of Independence: Independent with assistive device(s)         Comments: uses WC sometimes in home. but mostly "furniture walks". Pt admits to 2 falls this year.      Hand Dominance        Extremity/Trunk Assessment   Upper Extremity Assessment: Generalized weakness           Lower Extremity Assessment: Generalized weakness      Cervical / Trunk Assessment: Normal  Communication   Communication: HOH  Cognition Arousal/Alertness: Awake/alert Behavior During Therapy: WFL for tasks assessed/performed Overall Cognitive Status: Within Functional Limits for tasks assessed                      General Comments      Exercises        Assessment/Plan    PT Assessment Patient needs continued PT services  PT Diagnosis Difficulty walking;Generalized weakness   PT Problem List Decreased strength;Decreased activity tolerance;Decreased balance;Decreased mobility  PT Treatment Interventions Gait training;Functional mobility training;Therapeutic activities;Patient/family education;DME instruction;Therapeutic exercise   PT Goals (Current goals can be found in the Care Plan section) Acute Rehab PT Goals Patient Stated Goal: none stated PT Goal Formulation: With patient Time For Goal Achievement: 12/14/15 Potential to Achieve Goals: Good    Frequency Min 3X/week   Barriers to discharge  Co-evaluation               End of Session Equipment Utilized During Treatment: Gait belt;Oxygen Activity Tolerance: Patient tolerated treatment well Patient left: in chair;with call bell/phone within reach;with chair alarm set      Functional Assessment Tool Used: clinical judgement Functional Limitation: Mobility: Walking and moving around Mobility: Walking and Moving Around Current Status [id number]): At least 1 percent but less than 20 percent impaired, limited or restricted Mobility: Walking and  Moving Around Goal Status [phone]): At least 1 percent but less than 20 percent impaired, limited or restricted Mobility: Walking and Moving Around Discharge Status [phone]): At least 1 percent but less than 20 percent impaired, limited or restricted    Time: 0950-1020 PT Time Calculation (min) (ACUTE ONLY): 30 min   Charges:   PT Evaluation $PT Eval Low Complexity: 1 Procedure PT Treatments $Gait Training: 8-22 mins   PT G Codes:   PT G-Codes **NOT FOR INPATIENT CLASS** Functional Assessment Tool Used: clinical judgement Functional Limitation: Mobility: Walking and moving around Mobility: Walking and Moving Around Current Status [id number]): At least 1 percent but less than 20 percent impaired, limited or restricted Mobility: Walking and Moving Around Goal Status [phone]): At least 1 percent but less than 20 percent impaired, limited or restricted Mobility: Walking and Moving Around Discharge Status [phone]): At least 1 percent but less than 20 percent impaired, limited or restricted    Weston Anna, MPT Pager: [phone]

## 2015-12-03 ENCOUNTER — Telehealth: Payer: Self-pay | Admitting: Internal Medicine

## 2015-12-03 DIAGNOSIS — L89312 Pressure ulcer of right buttock, stage 2: Secondary | ICD-10-CM | POA: Diagnosis not present

## 2015-12-03 DIAGNOSIS — M150 Primary generalized (osteo)arthritis: Secondary | ICD-10-CM | POA: Diagnosis not present

## 2015-12-03 DIAGNOSIS — J449 Chronic obstructive pulmonary disease, unspecified: Secondary | ICD-10-CM | POA: Diagnosis not present

## 2015-12-03 DIAGNOSIS — S92252D Displaced fracture of navicular [scaphoid] of left foot, subsequent encounter for fracture with routine healing: Secondary | ICD-10-CM | POA: Diagnosis not present

## 2015-12-03 DIAGNOSIS — J441 Chronic obstructive pulmonary disease with (acute) exacerbation: Secondary | ICD-10-CM | POA: Diagnosis not present

## 2015-12-03 DIAGNOSIS — D500 Iron deficiency anemia secondary to blood loss (chronic): Secondary | ICD-10-CM | POA: Diagnosis not present

## 2015-12-03 DIAGNOSIS — J9611 Chronic respiratory failure with hypoxia: Secondary | ICD-10-CM | POA: Diagnosis not present

## 2015-12-03 DIAGNOSIS — K922 Gastrointestinal hemorrhage, unspecified: Secondary | ICD-10-CM | POA: Diagnosis not present

## 2015-12-03 DIAGNOSIS — M810 Age-related osteoporosis without current pathological fracture: Secondary | ICD-10-CM | POA: Diagnosis not present

## 2015-12-03 DIAGNOSIS — I10 Essential (primary) hypertension: Secondary | ICD-10-CM | POA: Diagnosis not present

## 2015-12-03 DIAGNOSIS — I2510 Atherosclerotic heart disease of native coronary artery without angina pectoris: Secondary | ICD-10-CM | POA: Diagnosis not present

## 2015-12-03 NOTE — Telephone Encounter (Signed)
BJ from [facility] wants to talk about wound orders for the patient.  Patient has a pressure ulcer and she wants to know if it can be treated with [facility] protocol.

## 2015-12-04 ENCOUNTER — Ambulatory Visit (INDEPENDENT_AMBULATORY_CARE_PROVIDER_SITE_OTHER): Payer: Commercial Managed Care - HMO | Admitting: Internal Medicine

## 2015-12-04 ENCOUNTER — Encounter: Payer: Self-pay | Admitting: Internal Medicine

## 2015-12-04 VITALS — BP 166/78 | HR 85 | Ht 64.0 in | Wt 164.4 lb

## 2015-12-04 DIAGNOSIS — J9611 Chronic respiratory failure with hypoxia: Secondary | ICD-10-CM

## 2015-12-04 DIAGNOSIS — J449 Chronic obstructive pulmonary disease, unspecified: Secondary | ICD-10-CM | POA: Diagnosis not present

## 2015-12-04 DIAGNOSIS — M810 Age-related osteoporosis without current pathological fracture: Secondary | ICD-10-CM | POA: Diagnosis not present

## 2015-12-04 DIAGNOSIS — I10 Essential (primary) hypertension: Secondary | ICD-10-CM | POA: Diagnosis not present

## 2015-12-04 DIAGNOSIS — M150 Primary generalized (osteo)arthritis: Secondary | ICD-10-CM | POA: Diagnosis not present

## 2015-12-04 DIAGNOSIS — L89312 Pressure ulcer of right buttock, stage 2: Secondary | ICD-10-CM | POA: Diagnosis not present

## 2015-12-04 DIAGNOSIS — J441 Chronic obstructive pulmonary disease with (acute) exacerbation: Secondary | ICD-10-CM | POA: Diagnosis not present

## 2015-12-04 DIAGNOSIS — D500 Iron deficiency anemia secondary to blood loss (chronic): Secondary | ICD-10-CM | POA: Diagnosis not present

## 2015-12-04 DIAGNOSIS — I2510 Atherosclerotic heart disease of native coronary artery without angina pectoris: Secondary | ICD-10-CM | POA: Diagnosis not present

## 2015-12-04 DIAGNOSIS — S92252D Displaced fracture of navicular [scaphoid] of left foot, subsequent encounter for fracture with routine healing: Secondary | ICD-10-CM | POA: Diagnosis not present

## 2015-12-04 DIAGNOSIS — K922 Gastrointestinal hemorrhage, unspecified: Secondary | ICD-10-CM | POA: Diagnosis not present

## 2015-12-04 MED ORDER — ROFLUMILAST 500 MCG PO TABS: 500.0000 ug | ORAL_TABLET | Freq: Every day | ORAL | Status: DC

## 2015-12-04 NOTE — Telephone Encounter (Signed)
Yes.  Okay for [facility] protocol

## 2015-12-04 NOTE — Patient Instructions (Signed)
ICD-9-CM ICD-10-CM   1. COPD, severe ([place]) 496 J44.9   2. Chronic respiratory failure with hypoxia (HCC) 518.83 J96.11    799.02      Continue o2 and nebs as before  - pulse ox goal > 88% Due to repeated copd flare up  - Take new tablet called roflumilast 1 tablet   - take 1 month sample then go [facility] and pick it up. Could be expensive. If so call us  -first week take every other day  - after that take daily  - always take after food  - if causes nausea, vomit, or other side effects call us  Followup  - 3 months or sooner to review progres

## 2015-12-04 NOTE — Telephone Encounter (Signed)
Left message on BJ's voicemail, verbal orders for wound care for pressure ulcer okay to treat pt with [place] Protocol per Dr.K. Any questions please call office.

## 2015-12-04 NOTE — Telephone Encounter (Signed)
Please see message and advise 

## 2015-12-04 NOTE — Progress Notes (Signed)
Subjective:     Patient ID: Pamela Hampton, female   DOB: 1940-10-03, 75 y.o.   MRN: [id number]  HPI   PCP Nyoka Cowden, MD  # 60 pack smoker quit 2004.   # Frequent UTI. Gyn cancer 1998  -s/p chemo Rx and. xrt = strong family hx of cancer  #lung imagin  - Feb 2013: No evicende of cancer on CT Chest - dec 2013 0 clear cxrf  #Obesity  - >  30# weight gain since 2010-2012  - Body mass index is 34.86 kg/(m^2).   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 Ratio 51 and DLCO 6.7/30\ Status post pulmonary rehabilitation summer 2013   #AECOPD - September 2013 treated in the office with doxycycline and prednisone on 04/14/2012 - December 2013 4 emergency room treatment with prednisone and antibiotics - July 2014 office treatment with Levaquin and prednisone - Nov 2014: office Rx - March 2015: office telpehone Rx - doxy/[pred - July 2015 - telephone RX pred alone - heat related aecopd - dec 2015 - telephone Rx with pred alone  ...................... OV 03/13/2014  Chief Complaint  Patient presents with  . Follow-up    Pt states her breathing is unchanged. Pt c/o DOE, prod cough with green and white. pt states her cough is worse in monring and when outdoors. Pt states when she becomes very dyspneic then she will experience mid sternal  CP. Pt states she received 2 units of blood in 01/2014 d/t hgb at 7.5   FU COPD with cor pulmonale  - doing well. NO admissions since nov/dec 2014 when last seen. IN [facility] 2015: Rx over phone for aecopd. Continues qvar and duoneb without a problem. On 4L o2.   Past hx: has chronic anemia and 4 wweeks ago PCP Nyoka Cowden, MD gave 2 units prbc for hgg 7s. Feels better after that. Also reports chronic nasal stuffiness  and head cold and wants ENT referral   OV 11/06/2014  Chief Complaint  Patient presents with  . Follow-up    Pt c/o prod cough with yellow mucus, SOB with activity. Right sided chest tightness occasionally. Denies any chest congestion, n/v/d. Wants to talk about going back to symbicort.   FU COPD with cor pulmonarle  - last seen August 2015. Since that she called in once in December 2015 for COPD exacerbation and received prednisone alonefor an exacerbation. She is on duo neb and Qvar and according to her and her female companion who is with her today she is fairly compliant with this. 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 She got some injection into the eye and since then she's had partial recovery of vision. Despite this she is able to see and uses inhalers   Labs Jan 2016 hgb 9.5gm% which appears stable   OV 06/07/2015  Chief Complaint  Patient presents with  . Follow-up    Pt c/o sinus congestion, PND, prod cough with brown mucus. Pt states her SOB is at baseline. Pt denies CP/tightness.    Follow-up COPD with chronic hypoxemia and  respiratory failure and cor pulmonale  - Last seen in April 2016. Since then his called in for an exacerbation. Currently she presents with a female companion. She is on chronic 4 L oxygen. She is on duo neb and Qvar. They both report compliance. She reports good stable health. For the last week she's having increased sinus drainage that is dirty brown in color. She does not know her baseline color but she does note that this is a change from her baseline. There is no fever or worsening cough or wheezing or chest tightness. She otherwise feels good in terms of energy level. This no hemoptysis. This no edema getting worse.  Chest x-ray April 2016 reported to be clear other than  large hiatal hernia and COPD. Not personally visualized   OV 12/04/2015  Chief Complaint  Patient presents with  . Follow-up    Pt states her breathing has worsened since last OV. Pt c/o prod cough with yellow mucus. Pt c/o nasal congestion.    Follow-up COPD with chronic hypoxemia and respiratory failure and cor pulmonale  She presents with the family. Last seen in November 2016. Since then she's had one or 2 exacerbations. She also reports having had some rash seen by primary care physician but this is resolved. She is also dealing with chronic sinus issues and dry. I notice that she's not using Nettie pot saline wash which I recommended. She feels more deconditioned than more chronically worsening shortness of breath and in the past. She is due to get home physical therapy. She has chronic fatigue. She hopes home physical therapy will help her. There is no baseline edema. Currently is the last few days of a COPD exacerbation treatment and she is feeling better in that sense. She is open to trying Roflumilast wall preventing COPD exacerbations.       CAT COPD Symptom & Quality of Life Score (GSK trademark) 0 is no burden. 5 is highest burden 04/14/2012  08/03/2012  02/14/2013  06/10/2013   Never Cough -> Cough all the time 1 4, baseline 2 4  No phlegm in chest -> Chest is full of phlegm 5 3, baseline 5 4  No chest tightness -> Chest feels very tight 1 0, /imrpoved 4 4  No dyspnea for 1 flight stairs/hill -> Very dyspneic for 1 flight of stairs 5 5, "improved" 5 5  No limitations for ADL at home -> Very limited with ADL at home 3 5 5 5   Confident leaving home -> Not at all confident leaving home 0 2 3 4   Sleep soundly -> Do not sleep soundly because of lung condition 3 4 4 5   Lots of Energy -> No energy at all 3 5 5 5   TOTAL Score (max 40)  21 28 32 36     Current outpatient prescriptions:  .  albuterol (PROAIR HFA) 108 (90 Base) MCG/ACT inhaler, INHALE 2 PUFFS INTO THE LUNGS EVERY 4  HOURS AS NEEDED FOR SHORTNESS OF BREATH, Disp: 8.5 g, Rfl: 12 .  albuterol (PROVENTIL) (2.5 MG/3ML) 0.083% nebulizer solution, INHALE THE CONTENTS OF 1 VIAL VIA NEBULIZER 4 TIMES A DAY., Disp: 360 mL, Rfl: 2 .  beclomethasone (QVAR) 80 MCG/ACT inhaler, Inhale 2 puffs into the lungs 2 (two) times daily., Disp: 8.7 g, Rfl: 6 .  CRESTOR 20 MG tablet, Take 1 tablet (20 mg total) by mouth every other day., Disp: 90 tablet, Rfl: 1 .  escitalopram (LEXAPRO) 10 MG tablet, Take 1 tablet (10 mg total)  by mouth daily., Disp: 30 tablet, Rfl: 5 .  fluticasone (FLONASE) 50 MCG/ACT nasal spray, PLACE 2 SPRAYS INTO BOTH NOSTRILS DAILY., Disp: 16 g, Rfl: 5 .  furosemide (LASIX) 40 MG tablet, Take 1 tablet (40 mg total) by mouth daily as needed for fluid., Disp: , Rfl:  .  ibandronate (BONIVA) 150 MG tablet, TAKE IN THE MORNING WITH A FULL GLASS OF WATER, ON AN EMPTY STOMACH ONCE MONTHLY., Disp: 4 tablet, Rfl: 3 .  ipratropium (ATROVENT) 0.02 % nebulizer solution, INHALE THE CONTENTS OF 1 VIAL VIA NEBULIZER 4 TIMES A DAY., Disp: 300 mL, Rfl: 6 .  omeprazole (PRILOSEC) 20 MG capsule, TAKE 1 CAPSULE BY MOUTH ONCE DAILY., Disp: 30 capsule, Rfl: 6 .  OXYGEN, Inhale 4 L/min into the lungs continuous., Disp: , Rfl:  .  polyethylene glycol powder (GLYCOLAX/MIRALAX) powder, Take 17 g by mouth 2 (two) times daily as needed. (Patient taking differently: Take 17 g by mouth daily. ), Disp: 3350 g, Rfl: 1 .  potassium chloride SA (K-DUR,KLOR-CON) 20 MEQ tablet, TAKE 1 TABLET BY MOUTH DAILY., Disp: 30 tablet, Rfl: 5 .  sodium chloride (AYR) 0.65 % nasal spray, Place 1 spray into the nose as needed for congestion., Disp: , Rfl:  .  traZODone (DESYREL) 100 MG tablet, Take 1 tablet (100 mg total) by mouth at bedtime., Disp: 90 tablet, Rfl: 1 .  Ferrous Sulfate Dried 200 (65 FE) MG TABS, 1 tablet by mouth twice daily (Patient not taking: Reported on 12/04/2015), Disp: 30 tablet, Rfl: 6   Review of Systems     Objective:   Physical  Exam Filed Vitals:   12/04/15 1428  BP: 166/78  Pulse: 85  Height: 5\' 4"  (1.626 m)  Weight: 164 lb 6.4 oz (74.571 kg)  SpO2: 96%   Focused exam: Obese lady sitting in a wheelchair with oxygen on Deconditioned looking Barrel chest with overall diminished air entry no wheeze or crackles Skin exam is intact Normal heart sounds No cyanosis no clubbing no edema Neurologic alert and oriented 3     Assessment:       ICD-9-CM ICD-10-CM   1. COPD, severe ([place]) 496 J44.9   2. Chronic respiratory failure with hypoxia (HCC) 518.83 J96.11    799.02    3. REcurrent COPD Exacrbation     Plan:       Continue o2 and nebs as before  - pulse ox goal > 88% Due to repeated copd flare up  - Take new tablet called roflumilast 1 tablet   - take 1 month sample then go [facility] and [facility] it up. Could be expensive. If so call us  -first week take every other day  - after that take daily  - always take after food  - if causes nausea, vomit, call us  Followup  - 3 months or sooner to review progres   > 50% of this > 25 min visit spent in face to face counseling or coordination of care   Dr. Brand Males, M.D., [facility].C.P Pulmonary and Critical Care Medicine Staff Physician [facility] [facility] Pager: [phone], If no answer or between  15:00h - 7:00h: call [phone]  12/04/2015 3:03 PM

## 2015-12-05 DIAGNOSIS — D500 Iron deficiency anemia secondary to blood loss (chronic): Secondary | ICD-10-CM | POA: Diagnosis not present

## 2015-12-05 DIAGNOSIS — M150 Primary generalized (osteo)arthritis: Secondary | ICD-10-CM | POA: Diagnosis not present

## 2015-12-05 DIAGNOSIS — K922 Gastrointestinal hemorrhage, unspecified: Secondary | ICD-10-CM | POA: Diagnosis not present

## 2015-12-05 DIAGNOSIS — L89312 Pressure ulcer of right buttock, stage 2: Secondary | ICD-10-CM | POA: Diagnosis not present

## 2015-12-05 DIAGNOSIS — J441 Chronic obstructive pulmonary disease with (acute) exacerbation: Secondary | ICD-10-CM | POA: Diagnosis not present

## 2015-12-05 DIAGNOSIS — M810 Age-related osteoporosis without current pathological fracture: Secondary | ICD-10-CM | POA: Diagnosis not present

## 2015-12-05 DIAGNOSIS — S92252D Displaced fracture of navicular [scaphoid] of left foot, subsequent encounter for fracture with routine healing: Secondary | ICD-10-CM | POA: Diagnosis not present

## 2015-12-05 DIAGNOSIS — I10 Essential (primary) hypertension: Secondary | ICD-10-CM | POA: Diagnosis not present

## 2015-12-05 DIAGNOSIS — I2510 Atherosclerotic heart disease of native coronary artery without angina pectoris: Secondary | ICD-10-CM | POA: Diagnosis not present

## 2015-12-06 DIAGNOSIS — I10 Essential (primary) hypertension: Secondary | ICD-10-CM | POA: Diagnosis not present

## 2015-12-06 DIAGNOSIS — J441 Chronic obstructive pulmonary disease with (acute) exacerbation: Secondary | ICD-10-CM | POA: Diagnosis not present

## 2015-12-06 DIAGNOSIS — I2510 Atherosclerotic heart disease of native coronary artery without angina pectoris: Secondary | ICD-10-CM | POA: Diagnosis not present

## 2015-12-06 DIAGNOSIS — K922 Gastrointestinal hemorrhage, unspecified: Secondary | ICD-10-CM | POA: Diagnosis not present

## 2015-12-06 DIAGNOSIS — S92252D Displaced fracture of navicular [scaphoid] of left foot, subsequent encounter for fracture with routine healing: Secondary | ICD-10-CM | POA: Diagnosis not present

## 2015-12-06 DIAGNOSIS — L89312 Pressure ulcer of right buttock, stage 2: Secondary | ICD-10-CM | POA: Diagnosis not present

## 2015-12-06 DIAGNOSIS — M150 Primary generalized (osteo)arthritis: Secondary | ICD-10-CM | POA: Diagnosis not present

## 2015-12-06 DIAGNOSIS — M810 Age-related osteoporosis without current pathological fracture: Secondary | ICD-10-CM | POA: Diagnosis not present

## 2015-12-06 DIAGNOSIS — D500 Iron deficiency anemia secondary to blood loss (chronic): Secondary | ICD-10-CM | POA: Diagnosis not present

## 2015-12-07 ENCOUNTER — Encounter: Payer: Self-pay | Admitting: Internal Medicine

## 2015-12-07 ENCOUNTER — Ambulatory Visit (INDEPENDENT_AMBULATORY_CARE_PROVIDER_SITE_OTHER): Payer: Commercial Managed Care - HMO | Admitting: Internal Medicine

## 2015-12-07 VITALS — BP 140/80 | HR 72 | Temp 98.1°F | Resp 20

## 2015-12-07 DIAGNOSIS — D509 Iron deficiency anemia, unspecified: Secondary | ICD-10-CM

## 2015-12-07 DIAGNOSIS — M150 Primary generalized (osteo)arthritis: Secondary | ICD-10-CM | POA: Diagnosis not present

## 2015-12-07 DIAGNOSIS — J441 Chronic obstructive pulmonary disease with (acute) exacerbation: Secondary | ICD-10-CM | POA: Diagnosis not present

## 2015-12-07 DIAGNOSIS — I2510 Atherosclerotic heart disease of native coronary artery without angina pectoris: Secondary | ICD-10-CM | POA: Diagnosis not present

## 2015-12-07 DIAGNOSIS — I10 Essential (primary) hypertension: Secondary | ICD-10-CM | POA: Diagnosis not present

## 2015-12-07 DIAGNOSIS — L89312 Pressure ulcer of right buttock, stage 2: Secondary | ICD-10-CM | POA: Diagnosis not present

## 2015-12-07 DIAGNOSIS — S92252D Displaced fracture of navicular [scaphoid] of left foot, subsequent encounter for fracture with routine healing: Secondary | ICD-10-CM | POA: Diagnosis not present

## 2015-12-07 DIAGNOSIS — Q2730 Arteriovenous malformation, site unspecified: Secondary | ICD-10-CM

## 2015-12-07 DIAGNOSIS — J449 Chronic obstructive pulmonary disease, unspecified: Secondary | ICD-10-CM | POA: Diagnosis not present

## 2015-12-07 DIAGNOSIS — D500 Iron deficiency anemia secondary to blood loss (chronic): Secondary | ICD-10-CM | POA: Diagnosis not present

## 2015-12-07 DIAGNOSIS — M810 Age-related osteoporosis without current pathological fracture: Secondary | ICD-10-CM | POA: Diagnosis not present

## 2015-12-07 DIAGNOSIS — K922 Gastrointestinal hemorrhage, unspecified: Secondary | ICD-10-CM | POA: Diagnosis not present

## 2015-12-07 LAB — CBC WITH DIFFERENTIAL/PLATELET
Basophils Absolute: 0.1 10*3/uL (ref 0.0–0.1)
Basophils Relative: 0.5 % (ref 0.0–3.0)
EOS PCT: 2.4 % (ref 0.0–5.0)
Eosinophils Absolute: 0.2 10*3/uL (ref 0.0–0.7)
HCT: 31 % — ABNORMAL LOW (ref 36.0–46.0)
HEMOGLOBIN: 9.3 g/dL — AB (ref 12.0–15.0)
Lymphocytes Relative: 32.5 % (ref 12.0–46.0)
Lymphs Abs: 3.1 10*3/uL (ref 0.7–4.0)
MCHC: 30.1 g/dL (ref 30.0–36.0)
MCV: 82.9 fl (ref 78.0–100.0)
MONOS PCT: 7.8 % (ref 3.0–12.0)
Monocytes Absolute: 0.8 10*3/uL (ref 0.1–1.0)
Neutro Abs: 5.5 10*3/uL (ref 1.4–7.7)
Neutrophils Relative %: 56.8 % (ref 43.0–77.0)
Platelets: 402 10*3/uL — ABNORMAL HIGH (ref 150.0–400.0)
RBC: 3.74 Mil/uL — AB (ref 3.87–5.11)
RDW: 23.1 % — ABNORMAL HIGH (ref 11.5–15.5)
WBC: 9.6 10*3/uL (ref 4.0–10.5)

## 2015-12-07 NOTE — Patient Instructions (Signed)
CBC monthly  Continue iron sulfate  Hematology follow-up as scheduled  Return here in 3 months for follow-up  Pulmonary follow-up as scheduled

## 2015-12-07 NOTE — Progress Notes (Signed)
Pre visit review using our clinic review tool, if applicable. No additional management support is needed unless otherwise documented below in the visit note.  Pt presented to office with oxygen on at 4L/min via nasal cannula.  

## 2015-12-07 NOTE — Progress Notes (Signed)
Subjective:    Patient ID: Pamela Hampton, female    DOB: 03-Apr-1941, 75 y.o.   MRN: [id number]  HPI  Admit date: 11/28/2015 Discharge date: 11/30/2015  Outpatient Follow-up:  1. PCP Dr.Kwiatowski in 1 week, please check CBC at FU 2. Dr.Gorsuch in 2 weeks, Iron transfusions PRN  Discharge Diagnoses:   Acute on chronic Iron deficiency anemia   Essential hypertension  Osteoporosis  COPD, severe (HCC)  Dermatitis  Pressure ulcer stage II  Chronic GI Blood loss anemia  75 year old patient who is seen today following a hospital discharge.  She has seen pulmonary medicine in follow-up.  She has severe COPD and a history of chronic GI blood loss.  She has essential hypertension.    doing well today. Pulmonary status is stable.  Remains on chronic O2.  Daliresp has been added to her regimen  Past Medical History  Diagnosis Date  . Allergy     Rhinitis  . Depression   . GERD (gastroesophageal reflux disease)     Barrett's esophagus  . Hyperlipidemia   . Hypertension   . Osteoporosis   . Seizures ([place])   . Barrett's esophagus 07/19/2004  . Anemia   . Obesity   . Pneumonia   . Stroke ([place])   . Sleep apnea   . Hepatitis   . Arthritis   . Blood transfusion   . COPD (chronic obstructive pulmonary disease) (HCC)     wears 3 liters of o2 continious  . Emphysema of lung ([place])   . Ulcer   . Hiatal hernia   . 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 radiation. chemo.  . Impaired hearing   . History of oxygen administration     oxygen concentrator @ 4 l/m nasally 24/ 7     Social History   Social History  . Marital Status: Divorced    Spouse Name: N/A  . Number of Children: 4  . Years of Education: N/A   Occupational History  . disaled    Social History Main Topics  . Smoking status: Former Smoker -- 2.00 packs/day for 30 years    Types: Cigarettes    Quit date: 07/28/2002  . Smokeless tobacco: Never Used  . Alcohol Use: No  . Drug Use: No  .  Sexual Activity: Not Currently   Other Topics Concern  . Not on file   Social History Narrative    Past Surgical History  Procedure Laterality Date  . Orif hip fracture Right   . Cholecystectomy    . Hemorrhoid surgery    . Total hip arthroplasty Right   . Abdominal hysterectomy    . 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 Abdominal laproscopy surgery    . Joint replacement    . Esophagogastroduodenoscopy (egd) with propofol N/A 12/21/2014    Procedure: ESOPHAGOGASTRODUODENOSCOPY (EGD) WITH PROPOFOL;  Surgeon: Inda Castle, MD;  Location: [facility];  Service: Endoscopy;  Laterality: N/A;    Family History  Problem Relation Age of Onset  . Heart disease Mother     MI  . Diabetes Mother   . Uterine cancer Mother   . Anemia Mother   . Hypertension Mother   . Stomach cancer Father   . Esophageal cancer Father   . Hypertension Father   . Diabetes Sister   . Anemia Sister   . Esophageal cancer Brother   . Hypertension Brother   .  Diabetes Son   . Diabetes Son   . Brain cancer Brother   . Lung cancer Sister   . Liver cancer Sister   . Colon cancer Neg Hx   . Rectal cancer Neg Hx     No Known Allergies  Current Outpatient Prescriptions on File Prior to Visit  Medication Sig Dispense Refill  . albuterol (PROAIR HFA) 108 (90 Base) MCG/ACT inhaler INHALE 2 PUFFS INTO THE LUNGS EVERY 4 HOURS AS NEEDED FOR SHORTNESS OF BREATH 8.5 g 12  . albuterol (PROVENTIL) (2.5 MG/3ML) 0.083% nebulizer solution INHALE THE CONTENTS OF 1 VIAL VIA NEBULIZER 4 TIMES A DAY. 360 mL 2  . beclomethasone (QVAR) 80 MCG/ACT inhaler Inhale 2 puffs into the lungs 2 (two) times daily. 8.7 g 6  . CRESTOR 20 MG tablet Take 1 tablet (20 mg total) by mouth every other day. 90 tablet 1  . escitalopram (LEXAPRO) 10 MG tablet Take 1 tablet (10 mg total) by mouth daily. 30 tablet 5  . Ferrous Sulfate Dried 200 (65 FE) MG TABS 1 tablet by mouth  twice daily 30 tablet 6  . fluticasone (FLONASE) 50 MCG/ACT nasal spray PLACE 2 SPRAYS INTO BOTH NOSTRILS DAILY. 16 g 5  . furosemide (LASIX) 40 MG tablet Take 1 tablet (40 mg total) by mouth daily as needed for fluid.    Marland Kitchen ibandronate (BONIVA) 150 MG tablet TAKE IN THE MORNING WITH A FULL GLASS OF WATER, ON AN EMPTY STOMACH ONCE MONTHLY. 4 tablet 3  . ipratropium (ATROVENT) 0.02 % nebulizer solution INHALE THE CONTENTS OF 1 VIAL VIA NEBULIZER 4 TIMES A DAY. 300 mL 6  . omeprazole (PRILOSEC) 20 MG capsule TAKE 1 CAPSULE BY MOUTH ONCE DAILY. 30 capsule 6  . OXYGEN Inhale 4 L/min into the lungs continuous.    . polyethylene glycol powder (GLYCOLAX/MIRALAX) powder Take 17 g by mouth 2 (two) times daily as needed. (Patient taking differently: Take 17 g by mouth daily. ) 3350 g 1  . potassium chloride SA (K-DUR,KLOR-CON) 20 MEQ tablet TAKE 1 TABLET BY MOUTH DAILY. 30 tablet 5  . roflumilast (DALIRESP) 500 MCG TABS tablet Take 1 tablet (500 mcg total) by mouth daily. 30 tablet 11  . sodium chloride (AYR) 0.65 % nasal spray Place 1 spray into the nose as needed for congestion.    . traZODone (DESYREL) 100 MG tablet Take 1 tablet (100 mg total) by mouth at bedtime. 90 tablet 1   No current facility-administered medications on file prior to visit.    BP 140/80 mmHg  Pulse 72  Temp(Src) 98.1 F (36.7 C) (Oral)  Resp 20  SpO2 98%     Review of Systems  Constitutional: Negative.   HENT: Negative for congestion, dental problem, hearing loss, rhinorrhea, sinus pressure, sore throat and tinnitus.   Eyes: Negative for pain, discharge and visual disturbance.  Respiratory: Positive for cough and shortness of breath.   Cardiovascular: Negative for chest pain, palpitations and leg swelling.  Gastrointestinal: Negative for nausea, vomiting, abdominal pain, diarrhea, constipation, blood in stool and abdominal distention.  Genitourinary: Negative for dysuria, urgency, frequency, hematuria, flank pain,  vaginal bleeding, vaginal discharge, difficulty urinating, vaginal pain and pelvic pain.  Musculoskeletal: Negative for joint swelling, arthralgias and gait problem.  Skin: Positive for rash.  Neurological: Negative for dizziness, syncope, speech difficulty, weakness, numbness and headaches.  Hematological: Negative for adenopathy.  Psychiatric/Behavioral: Negative for behavioral problems, dysphoric mood and agitation. The patient is not nervous/anxious.  Objective:   Physical Exam  Constitutional: She is oriented to person, place, and time. She appears well-developed and well-nourished.  Obese Chronic nasal cannula O2 O2 saturation 98 Afebrile No distress  HENT:  Head: Normocephalic.  Right Ear: External ear normal.  Left Ear: External ear normal.  Mouth/Throat: Oropharynx is clear and moist.  Eyes: Conjunctivae and EOM are normal. Pupils are equal, round, and reactive to light.  Neck: Normal range of motion. Neck supple. No thyromegaly present.  Cardiovascular: Normal rate, regular rhythm, normal heart sounds and intact distal pulses.   Pulmonary/Chest: Effort normal and breath sounds normal.  Diminished breath sounds Prolonged expiratory phase Few crackles at the base  Abdominal: Soft. Bowel sounds are normal. She exhibits no mass. There is no tenderness.  Musculoskeletal: Normal range of motion.  Lymphadenopathy:    She has no cervical adenopathy.  Neurological: She is alert and oriented to person, place, and time.  Skin: Skin is warm and dry. Rash noted.  Fading petechial rash over the lower abdominal area and lower extremities  Psychiatric: She has a normal mood and affect. Her behavior is normal.          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  Follow-up CBC.  Will arrange for monthly blood draws  Severe COPD  Essential hypertension, stable   Recheck 3 months

## 2015-12-10 DIAGNOSIS — J441 Chronic obstructive pulmonary disease with (acute) exacerbation: Secondary | ICD-10-CM | POA: Diagnosis not present

## 2015-12-10 DIAGNOSIS — J449 Chronic obstructive pulmonary disease, unspecified: Secondary | ICD-10-CM | POA: Diagnosis not present

## 2015-12-10 DIAGNOSIS — K922 Gastrointestinal hemorrhage, unspecified: Secondary | ICD-10-CM | POA: Diagnosis not present

## 2015-12-10 DIAGNOSIS — I2510 Atherosclerotic heart disease of native coronary artery without angina pectoris: Secondary | ICD-10-CM | POA: Diagnosis not present

## 2015-12-10 DIAGNOSIS — M810 Age-related osteoporosis without current pathological fracture: Secondary | ICD-10-CM | POA: Diagnosis not present

## 2015-12-10 DIAGNOSIS — D500 Iron deficiency anemia secondary to blood loss (chronic): Secondary | ICD-10-CM | POA: Diagnosis not present

## 2015-12-10 DIAGNOSIS — I10 Essential (primary) hypertension: Secondary | ICD-10-CM | POA: Diagnosis not present

## 2015-12-10 DIAGNOSIS — M150 Primary generalized (osteo)arthritis: Secondary | ICD-10-CM | POA: Diagnosis not present

## 2015-12-10 DIAGNOSIS — L89312 Pressure ulcer of right buttock, stage 2: Secondary | ICD-10-CM | POA: Diagnosis not present

## 2015-12-10 DIAGNOSIS — S92252D Displaced fracture of navicular [scaphoid] of left foot, subsequent encounter for fracture with routine healing: Secondary | ICD-10-CM | POA: Diagnosis not present

## 2015-12-11 DIAGNOSIS — L89312 Pressure ulcer of right buttock, stage 2: Secondary | ICD-10-CM | POA: Diagnosis not present

## 2015-12-11 DIAGNOSIS — M150 Primary generalized (osteo)arthritis: Secondary | ICD-10-CM | POA: Diagnosis not present

## 2015-12-11 DIAGNOSIS — S92252D Displaced fracture of navicular [scaphoid] of left foot, subsequent encounter for fracture with routine healing: Secondary | ICD-10-CM | POA: Diagnosis not present

## 2015-12-11 DIAGNOSIS — I10 Essential (primary) hypertension: Secondary | ICD-10-CM | POA: Diagnosis not present

## 2015-12-11 DIAGNOSIS — M810 Age-related osteoporosis without current pathological fracture: Secondary | ICD-10-CM | POA: Diagnosis not present

## 2015-12-11 DIAGNOSIS — J441 Chronic obstructive pulmonary disease with (acute) exacerbation: Secondary | ICD-10-CM | POA: Diagnosis not present

## 2015-12-11 DIAGNOSIS — D500 Iron deficiency anemia secondary to blood loss (chronic): Secondary | ICD-10-CM | POA: Diagnosis not present

## 2015-12-11 DIAGNOSIS — I2510 Atherosclerotic heart disease of native coronary artery without angina pectoris: Secondary | ICD-10-CM | POA: Diagnosis not present

## 2015-12-11 DIAGNOSIS — K922 Gastrointestinal hemorrhage, unspecified: Secondary | ICD-10-CM | POA: Diagnosis not present

## 2015-12-12 DIAGNOSIS — M810 Age-related osteoporosis without current pathological fracture: Secondary | ICD-10-CM | POA: Diagnosis not present

## 2015-12-12 DIAGNOSIS — S92252D Displaced fracture of navicular [scaphoid] of left foot, subsequent encounter for fracture with routine healing: Secondary | ICD-10-CM | POA: Diagnosis not present

## 2015-12-12 DIAGNOSIS — I10 Essential (primary) hypertension: Secondary | ICD-10-CM | POA: Diagnosis not present

## 2015-12-12 DIAGNOSIS — M150 Primary generalized (osteo)arthritis: Secondary | ICD-10-CM | POA: Diagnosis not present

## 2015-12-12 DIAGNOSIS — L89312 Pressure ulcer of right buttock, stage 2: Secondary | ICD-10-CM | POA: Diagnosis not present

## 2015-12-12 DIAGNOSIS — J441 Chronic obstructive pulmonary disease with (acute) exacerbation: Secondary | ICD-10-CM | POA: Diagnosis not present

## 2015-12-12 DIAGNOSIS — K922 Gastrointestinal hemorrhage, unspecified: Secondary | ICD-10-CM | POA: Diagnosis not present

## 2015-12-12 DIAGNOSIS — D500 Iron deficiency anemia secondary to blood loss (chronic): Secondary | ICD-10-CM | POA: Diagnosis not present

## 2015-12-12 DIAGNOSIS — I2510 Atherosclerotic heart disease of native coronary artery without angina pectoris: Secondary | ICD-10-CM | POA: Diagnosis not present

## 2015-12-13 DIAGNOSIS — S92252D Displaced fracture of navicular [scaphoid] of left foot, subsequent encounter for fracture with routine healing: Secondary | ICD-10-CM | POA: Diagnosis not present

## 2015-12-13 DIAGNOSIS — K922 Gastrointestinal hemorrhage, unspecified: Secondary | ICD-10-CM | POA: Diagnosis not present

## 2015-12-13 DIAGNOSIS — L89312 Pressure ulcer of right buttock, stage 2: Secondary | ICD-10-CM | POA: Diagnosis not present

## 2015-12-13 DIAGNOSIS — M150 Primary generalized (osteo)arthritis: Secondary | ICD-10-CM | POA: Diagnosis not present

## 2015-12-13 DIAGNOSIS — D500 Iron deficiency anemia secondary to blood loss (chronic): Secondary | ICD-10-CM | POA: Diagnosis not present

## 2015-12-13 DIAGNOSIS — I10 Essential (primary) hypertension: Secondary | ICD-10-CM | POA: Diagnosis not present

## 2015-12-13 DIAGNOSIS — I2510 Atherosclerotic heart disease of native coronary artery without angina pectoris: Secondary | ICD-10-CM | POA: Diagnosis not present

## 2015-12-13 DIAGNOSIS — J441 Chronic obstructive pulmonary disease with (acute) exacerbation: Secondary | ICD-10-CM | POA: Diagnosis not present

## 2015-12-13 DIAGNOSIS — M810 Age-related osteoporosis without current pathological fracture: Secondary | ICD-10-CM | POA: Diagnosis not present

## 2015-12-15 DIAGNOSIS — K922 Gastrointestinal hemorrhage, unspecified: Secondary | ICD-10-CM | POA: Diagnosis not present

## 2015-12-15 DIAGNOSIS — J441 Chronic obstructive pulmonary disease with (acute) exacerbation: Secondary | ICD-10-CM | POA: Diagnosis not present

## 2015-12-15 DIAGNOSIS — S92252D Displaced fracture of navicular [scaphoid] of left foot, subsequent encounter for fracture with routine healing: Secondary | ICD-10-CM | POA: Diagnosis not present

## 2015-12-15 DIAGNOSIS — L89312 Pressure ulcer of right buttock, stage 2: Secondary | ICD-10-CM | POA: Diagnosis not present

## 2015-12-15 DIAGNOSIS — M150 Primary generalized (osteo)arthritis: Secondary | ICD-10-CM | POA: Diagnosis not present

## 2015-12-15 DIAGNOSIS — M810 Age-related osteoporosis without current pathological fracture: Secondary | ICD-10-CM | POA: Diagnosis not present

## 2015-12-15 DIAGNOSIS — I10 Essential (primary) hypertension: Secondary | ICD-10-CM | POA: Diagnosis not present

## 2015-12-15 DIAGNOSIS — D500 Iron deficiency anemia secondary to blood loss (chronic): Secondary | ICD-10-CM | POA: Diagnosis not present

## 2015-12-15 DIAGNOSIS — I2510 Atherosclerotic heart disease of native coronary artery without angina pectoris: Secondary | ICD-10-CM | POA: Diagnosis not present

## 2015-12-17 DIAGNOSIS — K922 Gastrointestinal hemorrhage, unspecified: Secondary | ICD-10-CM | POA: Diagnosis not present

## 2015-12-17 DIAGNOSIS — I2510 Atherosclerotic heart disease of native coronary artery without angina pectoris: Secondary | ICD-10-CM | POA: Diagnosis not present

## 2015-12-17 DIAGNOSIS — J441 Chronic obstructive pulmonary disease with (acute) exacerbation: Secondary | ICD-10-CM | POA: Diagnosis not present

## 2015-12-17 DIAGNOSIS — M810 Age-related osteoporosis without current pathological fracture: Secondary | ICD-10-CM | POA: Diagnosis not present

## 2015-12-17 DIAGNOSIS — L89312 Pressure ulcer of right buttock, stage 2: Secondary | ICD-10-CM | POA: Diagnosis not present

## 2015-12-17 DIAGNOSIS — M150 Primary generalized (osteo)arthritis: Secondary | ICD-10-CM | POA: Diagnosis not present

## 2015-12-17 DIAGNOSIS — I10 Essential (primary) hypertension: Secondary | ICD-10-CM | POA: Diagnosis not present

## 2015-12-17 DIAGNOSIS — D500 Iron deficiency anemia secondary to blood loss (chronic): Secondary | ICD-10-CM | POA: Diagnosis not present

## 2015-12-17 DIAGNOSIS — S92252D Displaced fracture of navicular [scaphoid] of left foot, subsequent encounter for fracture with routine healing: Secondary | ICD-10-CM | POA: Diagnosis not present

## 2015-12-18 DIAGNOSIS — I10 Essential (primary) hypertension: Secondary | ICD-10-CM | POA: Diagnosis not present

## 2015-12-18 DIAGNOSIS — M150 Primary generalized (osteo)arthritis: Secondary | ICD-10-CM | POA: Diagnosis not present

## 2015-12-18 DIAGNOSIS — D500 Iron deficiency anemia secondary to blood loss (chronic): Secondary | ICD-10-CM | POA: Diagnosis not present

## 2015-12-18 DIAGNOSIS — L89312 Pressure ulcer of right buttock, stage 2: Secondary | ICD-10-CM | POA: Diagnosis not present

## 2015-12-18 DIAGNOSIS — K922 Gastrointestinal hemorrhage, unspecified: Secondary | ICD-10-CM | POA: Diagnosis not present

## 2015-12-18 DIAGNOSIS — J441 Chronic obstructive pulmonary disease with (acute) exacerbation: Secondary | ICD-10-CM | POA: Diagnosis not present

## 2015-12-18 DIAGNOSIS — M810 Age-related osteoporosis without current pathological fracture: Secondary | ICD-10-CM | POA: Diagnosis not present

## 2015-12-18 DIAGNOSIS — S92252D Displaced fracture of navicular [scaphoid] of left foot, subsequent encounter for fracture with routine healing: Secondary | ICD-10-CM | POA: Diagnosis not present

## 2015-12-18 DIAGNOSIS — I2510 Atherosclerotic heart disease of native coronary artery without angina pectoris: Secondary | ICD-10-CM | POA: Diagnosis not present

## 2015-12-19 DIAGNOSIS — J441 Chronic obstructive pulmonary disease with (acute) exacerbation: Secondary | ICD-10-CM | POA: Diagnosis not present

## 2015-12-19 DIAGNOSIS — I10 Essential (primary) hypertension: Secondary | ICD-10-CM | POA: Diagnosis not present

## 2015-12-19 DIAGNOSIS — D500 Iron deficiency anemia secondary to blood loss (chronic): Secondary | ICD-10-CM | POA: Diagnosis not present

## 2015-12-19 DIAGNOSIS — I2510 Atherosclerotic heart disease of native coronary artery without angina pectoris: Secondary | ICD-10-CM | POA: Diagnosis not present

## 2015-12-19 DIAGNOSIS — M150 Primary generalized (osteo)arthritis: Secondary | ICD-10-CM | POA: Diagnosis not present

## 2015-12-19 DIAGNOSIS — M810 Age-related osteoporosis without current pathological fracture: Secondary | ICD-10-CM | POA: Diagnosis not present

## 2015-12-19 DIAGNOSIS — L89312 Pressure ulcer of right buttock, stage 2: Secondary | ICD-10-CM | POA: Diagnosis not present

## 2015-12-19 DIAGNOSIS — S92252D Displaced fracture of navicular [scaphoid] of left foot, subsequent encounter for fracture with routine healing: Secondary | ICD-10-CM | POA: Diagnosis not present

## 2015-12-19 DIAGNOSIS — K922 Gastrointestinal hemorrhage, unspecified: Secondary | ICD-10-CM | POA: Diagnosis not present

## 2015-12-20 DIAGNOSIS — D500 Iron deficiency anemia secondary to blood loss (chronic): Secondary | ICD-10-CM | POA: Diagnosis not present

## 2015-12-20 DIAGNOSIS — L89312 Pressure ulcer of right buttock, stage 2: Secondary | ICD-10-CM | POA: Diagnosis not present

## 2015-12-20 DIAGNOSIS — M150 Primary generalized (osteo)arthritis: Secondary | ICD-10-CM | POA: Diagnosis not present

## 2015-12-20 DIAGNOSIS — I2510 Atherosclerotic heart disease of native coronary artery without angina pectoris: Secondary | ICD-10-CM | POA: Diagnosis not present

## 2015-12-20 DIAGNOSIS — J441 Chronic obstructive pulmonary disease with (acute) exacerbation: Secondary | ICD-10-CM | POA: Diagnosis not present

## 2015-12-20 DIAGNOSIS — M810 Age-related osteoporosis without current pathological fracture: Secondary | ICD-10-CM | POA: Diagnosis not present

## 2015-12-20 DIAGNOSIS — K922 Gastrointestinal hemorrhage, unspecified: Secondary | ICD-10-CM | POA: Diagnosis not present

## 2015-12-20 DIAGNOSIS — I10 Essential (primary) hypertension: Secondary | ICD-10-CM | POA: Diagnosis not present

## 2015-12-20 DIAGNOSIS — S92252D Displaced fracture of navicular [scaphoid] of left foot, subsequent encounter for fracture with routine healing: Secondary | ICD-10-CM | POA: Diagnosis not present

## 2015-12-23 DIAGNOSIS — I10 Essential (primary) hypertension: Secondary | ICD-10-CM | POA: Diagnosis not present

## 2015-12-23 DIAGNOSIS — J449 Chronic obstructive pulmonary disease, unspecified: Secondary | ICD-10-CM | POA: Diagnosis not present

## 2015-12-23 DIAGNOSIS — J9611 Chronic respiratory failure with hypoxia: Secondary | ICD-10-CM | POA: Diagnosis not present

## 2015-12-26 DIAGNOSIS — I2510 Atherosclerotic heart disease of native coronary artery without angina pectoris: Secondary | ICD-10-CM | POA: Diagnosis not present

## 2015-12-26 DIAGNOSIS — M150 Primary generalized (osteo)arthritis: Secondary | ICD-10-CM | POA: Diagnosis not present

## 2015-12-26 DIAGNOSIS — L89312 Pressure ulcer of right buttock, stage 2: Secondary | ICD-10-CM | POA: Diagnosis not present

## 2015-12-26 DIAGNOSIS — D500 Iron deficiency anemia secondary to blood loss (chronic): Secondary | ICD-10-CM | POA: Diagnosis not present

## 2015-12-26 DIAGNOSIS — S92252D Displaced fracture of navicular [scaphoid] of left foot, subsequent encounter for fracture with routine healing: Secondary | ICD-10-CM | POA: Diagnosis not present

## 2015-12-26 DIAGNOSIS — J441 Chronic obstructive pulmonary disease with (acute) exacerbation: Secondary | ICD-10-CM | POA: Diagnosis not present

## 2015-12-26 DIAGNOSIS — I10 Essential (primary) hypertension: Secondary | ICD-10-CM | POA: Diagnosis not present

## 2015-12-26 DIAGNOSIS — M810 Age-related osteoporosis without current pathological fracture: Secondary | ICD-10-CM | POA: Diagnosis not present

## 2015-12-26 DIAGNOSIS — K922 Gastrointestinal hemorrhage, unspecified: Secondary | ICD-10-CM | POA: Diagnosis not present

## 2016-01-04 DIAGNOSIS — Z7689 Persons encountering health services in other specified circumstances: Secondary | ICD-10-CM

## 2016-01-07 ENCOUNTER — Telehealth: Payer: Self-pay | Admitting: Internal Medicine

## 2016-01-07 ENCOUNTER — Emergency Department (HOSPITAL_COMMUNITY)
Admission: EM | Admit: 2016-01-07 | Discharge: 2016-01-08 | Disposition: A | Discharge status: Home / Self Care | Payer: Commercial Managed Care - HMO | Attending: Emergency Medicine | Admitting: Emergency Medicine

## 2016-01-07 ENCOUNTER — Encounter (HOSPITAL_COMMUNITY): Payer: Self-pay | Admitting: *Deleted

## 2016-01-07 DIAGNOSIS — I10 Essential (primary) hypertension: Secondary | ICD-10-CM | POA: Diagnosis not present

## 2016-01-07 DIAGNOSIS — E785 Hyperlipidemia, unspecified: Secondary | ICD-10-CM | POA: Diagnosis not present

## 2016-01-07 DIAGNOSIS — Z79899 Other long term (current) drug therapy: Secondary | ICD-10-CM | POA: Diagnosis not present

## 2016-01-07 DIAGNOSIS — J449 Chronic obstructive pulmonary disease, unspecified: Secondary | ICD-10-CM | POA: Diagnosis not present

## 2016-01-07 DIAGNOSIS — M1990 Unspecified osteoarthritis, unspecified site: Secondary | ICD-10-CM | POA: Insufficient documentation

## 2016-01-07 DIAGNOSIS — Z7951 Long term (current) use of inhaled steroids: Secondary | ICD-10-CM | POA: Diagnosis not present

## 2016-01-07 DIAGNOSIS — Z87891 Personal history of nicotine dependence: Secondary | ICD-10-CM | POA: Diagnosis not present

## 2016-01-07 DIAGNOSIS — F329 Major depressive disorder, single episode, unspecified: Secondary | ICD-10-CM | POA: Insufficient documentation

## 2016-01-07 DIAGNOSIS — Z8544 Personal history of malignant neoplasm of other female genital organs: Secondary | ICD-10-CM | POA: Diagnosis not present

## 2016-01-07 DIAGNOSIS — D649 Anemia, unspecified: Secondary | ICD-10-CM | POA: Diagnosis present

## 2016-01-07 DIAGNOSIS — Z8673 Personal history of transient ischemic attack (TIA), and cerebral infarction without residual deficits: Secondary | ICD-10-CM | POA: Diagnosis not present

## 2016-01-07 DIAGNOSIS — Z8669 Personal history of other diseases of the nervous system and sense organs: Secondary | ICD-10-CM | POA: Diagnosis not present

## 2016-01-07 DIAGNOSIS — E860 Dehydration: Secondary | ICD-10-CM | POA: Insufficient documentation

## 2016-01-07 LAB — RETICULOCYTES
RBC.: 3.98 MIL/uL (ref 3.87–5.11)
RETIC CT PCT: 0.7 % (ref 0.4–3.1)
Retic Count, Absolute: 27.9 10*3/uL (ref 19.0–186.0)

## 2016-01-07 LAB — CBC WITH DIFFERENTIAL/PLATELET
BASOS ABS: 0.1 10*3/uL (ref 0.0–0.1)
BASOS PCT: 1 %
EOS ABS: 0.2 10*3/uL (ref 0.0–0.7)
EOS PCT: 2 %
HCT: 33.3 % — ABNORMAL LOW (ref 36.0–46.0)
HEMOGLOBIN: 10.2 g/dL — AB (ref 12.0–15.0)
LYMPHS ABS: 4.2 10*3/uL — AB (ref 0.7–4.0)
Lymphocytes Relative: 53 %
MCH: 25.6 pg — AB (ref 26.0–34.0)
MCHC: 30.6 g/dL (ref 30.0–36.0)
MCV: 83.7 fL (ref 78.0–100.0)
Monocytes Absolute: 0.7 10*3/uL (ref 0.1–1.0)
Monocytes Relative: 9 %
NEUTROS PCT: 35 %
Neutro Abs: 2.7 10*3/uL (ref 1.7–7.7)
PLATELETS: 356 10*3/uL (ref 150–400)
RBC: 3.98 MIL/uL (ref 3.87–5.11)
RDW: 20.2 % — ABNORMAL HIGH (ref 11.5–15.5)
WBC: 7.8 10*3/uL (ref 4.0–10.5)

## 2016-01-07 LAB — COMPREHENSIVE METABOLIC PANEL
ALBUMIN: 2.9 g/dL — AB (ref 3.5–5.0)
ALK PHOS: 83 U/L (ref 38–126)
ALT: 7 U/L — ABNORMAL LOW (ref 14–54)
ANION GAP: 10 (ref 5–15)
AST: 13 U/L — AB (ref 15–41)
BILIRUBIN TOTAL: 0.4 mg/dL (ref 0.3–1.2)
BUN: 24 mg/dL — AB (ref 6–20)
CALCIUM: 8.3 mg/dL — AB (ref 8.9–10.3)
CO2: 25 mmol/L (ref 22–32)
Chloride: 104 mmol/L (ref 101–111)
Creatinine, Ser: 1.96 mg/dL — ABNORMAL HIGH (ref 0.44–1.00)
GFR calc Af Amer: 28 mL/min — ABNORMAL LOW (ref >60)
GFR, EST NON AFRICAN AMERICAN: 24 mL/min — AB (ref >60)
GLUCOSE: 89 mg/dL (ref 65–99)
Potassium: 4 mmol/L (ref 3.5–5.1)
Sodium: 139 mmol/L (ref 135–145)
TOTAL PROTEIN: 7.6 g/dL (ref 6.5–8.1)

## 2016-01-07 LAB — CBG MONITORING, ED: Glucose-Capillary: 103 mg/dL — ABNORMAL HIGH (ref 65–99)

## 2016-01-07 MED ORDER — SODIUM CHLORIDE 0.9 % IV BOLUS (SEPSIS)
1000.0000 mL | Freq: Once | INTRAVENOUS | Status: AC
  Administered 2016-01-07: 1000 mL via INTRAVENOUS

## 2016-01-07 NOTE — ED Notes (Addendum)
Pt reports she called her doctor d/t rash all over her body which she started to notice Saturday.  States rash does not itch or hurt.  She reports the last time this happened last month, her Hgb was 6-2 days prior it was 8.2.  Pt reports receiving blood txs once a year.  Received 3 units last month. Denies any complaints at this time but dizziness which is not new.

## 2016-01-07 NOTE — Telephone Encounter (Signed)
Spoke with Daughter and she said they're on their way to the ER.

## 2016-01-07 NOTE — Telephone Encounter (Signed)
[facility] - Client [facility] [place] [facility]  Patient Name: Pamela Hampton  DOB: 16-Aug-1940    Initial Comment Mother-in-law has red spots all over. She's not itching. Caller states it has something to do with her blood.    Nurse Assessment  Nurse: Wynetta Emery, RN, Baker Janus Date/Time Eilene Ghazi Time): 01/07/2016 4:05:34 PM  Confirm and document reason for call. If symptomatic, describe symptoms. You must click the next button to save text entered. ---[name] has red spots all over her -arms legs and stomach -- comes from being anemic and usually need blood transfusions.  Has the patient traveled out of the country within the last 30 days? ---No  Does the patient have any new or worsening symptoms? ---Yes  Will a triage be completed? ---Yes  Related visit to physician within the last 2 weeks? ---No  Does the PT have any chronic conditions? (i.e. diabetes, asthma, etc.) ---Yes  List chronic conditions. ---Anemic  Is this a behavioral health or substance abuse call? ---No     Guidelines    Guideline Title Affirmed Question Affirmed Notes  Rash or Redness - Widespread Mild widespread rash    Final Disposition User   See Physician within 4 Hours (or PCP triage) Wynetta Emery, RN, Baker Janus    Comments  NO availibility today for Dr. Burnice Logan -- since the spots sent her to ED last month and needed a transfusion (several) Nurse is going to send to ED does not feel comfortable having her wait until tomorrow to see a MD   Referrals  [facility] UNDECIDED   Disagree/Comply: Comply

## 2016-01-07 NOTE — ED Notes (Signed)
PT laying in bed with family at bedside no acute distress. Awaiting fluids to complete then discharge.

## 2016-01-07 NOTE — Discharge Instructions (Signed)

## 2016-01-07 NOTE — Telephone Encounter (Signed)
Pt's daughter would like to know if you have received pt's FMLA paperwork and it is filled out yet?

## 2016-01-07 NOTE — ED Provider Notes (Signed)
CSN: [id number]     Arrival date & time 01/07/16  1748 History   First MD Initiated Contact with Patient 01/07/16 2003     Chief Complaint  Patient presents with  . Anemia     (Consider location/radiation/quality/duration/timing/severity/associated sxs/prior Treatment) Patient is a 75 y.o. female presenting with rash. The history is provided by the patient.  Rash Location:  Full body Quality: not blistering, not bruising, not itchy, not painful, not scaling and not swelling   Severity:  Mild Onset quality:  Gradual Duration:  2 days Timing:  Constant Progression:  Worsening Chronicity:  Recurrent Context: not animal contact, not exposure to similar rash and not new detergent/soap   Relieved by:  Nothing Worsened by:  Nothing tried Ineffective treatments:  None tried Associated symptoms: no abdominal pain, no fever, no myalgias and no shortness of breath (on baseline home O2)     Past Medical History  Diagnosis Date  . Allergy     Rhinitis  . Depression   . GERD (gastroesophageal reflux disease)     Barrett's esophagus  . Hyperlipidemia   . Hypertension   . Osteoporosis   . Seizures ([place])   . Barrett's esophagus 07/19/2004  . Anemia   . Obesity   . Pneumonia   . Stroke ([place])   . Sleep apnea   . Hepatitis   . Arthritis   . Blood transfusion   . COPD (chronic obstructive pulmonary disease) (HCC)     wears 3 liters of o2 continious  . Emphysema of lung ([place])   . Ulcer   . Hiatal hernia   . 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 radiation. chemo.  . Impaired hearing   . History of oxygen administration     oxygen concentrator @ 4 l/m nasally 24/ 7   Past Surgical History  Procedure Laterality Date  . Orif hip fracture Right   . Cholecystectomy    . Hemorrhoid surgery    . Total hip arthroplasty Right   . Abdominal hysterectomy    . 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 Abdominal  laproscopy surgery    . Joint replacement    . Esophagogastroduodenoscopy (egd) with propofol N/A 12/21/2014    Procedure: ESOPHAGOGASTRODUODENOSCOPY (EGD) WITH PROPOFOL;  Surgeon: Inda Castle, MD;  Location: [facility];  Service: Endoscopy;  Laterality: N/A;   Family History  Problem Relation Age of Onset  . Heart disease Mother     MI  . Diabetes Mother   . Uterine cancer Mother   . Anemia Mother   . Hypertension Mother   . Stomach cancer Father   . Esophageal cancer Father   . Hypertension Father   . Diabetes Sister   . Anemia Sister   . Esophageal cancer Brother   . Hypertension Brother   . Diabetes Son   . Diabetes Son   . Brain cancer Brother   . Lung cancer Sister   . Liver cancer Sister   . Colon cancer Neg Hx   . Rectal cancer Neg Hx    Social History  Substance Use Topics  . Smoking status: Former Smoker -- 2.00 packs/day for 30 years    Types: Cigarettes    Quit date: 07/28/2002  . Smokeless tobacco: Never Used  . Alcohol Use: No   OB History    Gravida Para Term Preterm AB TAB SAB Ectopic Multiple Living   4 4  0 0 0 0 0 0 4     Review of Systems  Constitutional: Negative for fever.  Respiratory: Negative for shortness of breath (on baseline home O2).   Gastrointestinal: Negative for abdominal pain.  Musculoskeletal: Negative for myalgias.  Skin: Positive for rash.  All other systems reviewed and are negative.     Allergies  Review of patient's allergies indicates no known allergies.  Home Medications   Prior to Admission medications   Medication Sig Start Date End Date Taking? Authorizing Provider  albuterol (PROAIR HFA) 108 (90 Base) MCG/ACT inhaler INHALE 2 PUFFS INTO THE LUNGS EVERY 4 HOURS AS NEEDED FOR SHORTNESS OF BREATH 10/22/15  Yes Marletta Lor, MD  albuterol (PROVENTIL) (2.5 MG/3ML) 0.083% nebulizer solution INHALE THE CONTENTS OF 1 VIAL VIA NEBULIZER 4 TIMES A DAY. 11/08/15  Yes Brand Males, MD  beclomethasone (QVAR)  80 MCG/ACT inhaler Inhale 2 puffs into the lungs 2 (two) times daily. 12/01/14  Yes Brand Males, MD  CRESTOR 20 MG tablet Take 1 tablet (20 mg total) by mouth every other day. 10/22/15  Yes Marletta Lor, MD  escitalopram (LEXAPRO) 10 MG tablet Take 1 tablet (10 mg total) by mouth daily. 10/22/15  Yes Marletta Lor, MD  Ferrous Sulfate Dried 200 (65 FE) MG TABS 1 tablet by mouth twice daily 04/24/15  Yes Marletta Lor, MD  fluticasone [facility]) 50 MCG/ACT nasal spray PLACE 2 SPRAYS INTO BOTH NOSTRILS DAILY. 07/06/15  Yes Marletta Lor, MD  ipratropium (ATROVENT) 0.02 % nebulizer solution INHALE THE CONTENTS OF 1 VIAL VIA NEBULIZER 4 TIMES A DAY. 08/13/15  Yes Brand Males, MD  omeprazole (PRILOSEC) 20 MG capsule TAKE 1 CAPSULE BY MOUTH ONCE DAILY. 04/26/15  Yes Brand Males, MD  polyethylene glycol powder (GLYCOLAX/MIRALAX) powder Take 17 g by mouth 2 (two) times daily as needed. Patient taking differently: Take 17 g by mouth daily as needed for mild constipation.  08/30/14  Yes Marletta Lor, MD  potassium chloride SA (K-DUR,KLOR-CON) 20 MEQ tablet TAKE 1 TABLET BY MOUTH DAILY. 11/22/15  Yes Marletta Lor, MD  roflumilast (DALIRESP) 500 MCG TABS tablet Take 1 tablet (500 mcg total) by mouth daily. 12/04/15  Yes Brand Males, MD  traZODone (DESYREL) 100 MG tablet Take 1 tablet (100 mg total) by mouth at bedtime. 10/22/15  Yes Marletta Lor, MD  furosemide (LASIX) 40 MG tablet Take 1 tablet (40 mg total) by mouth daily as needed for fluid. 11/30/15   Domenic Polite, MD  ibandronate (BONIVA) 150 MG tablet TAKE IN THE MORNING WITH A FULL GLASS OF WATER, ON AN EMPTY STOMACH ONCE MONTHLY. 05/14/15   Marletta Lor, MD  OXYGEN Inhale 4 L/min into the lungs continuous.    Historical Provider, MD  sodium chloride (AYR) 0.65 % nasal spray Place 1 spray into the nose as needed for congestion.    Historical Provider, MD   BP 130/67 mmHg  Pulse 79  Temp(Src)  98.2 F (36.8 C) (Oral)  Resp 18  Ht 5\' 4"  (1.626 m)  Wt 158 lb (71.668 kg)  BMI 27.11 kg/m2  SpO2 97% Physical Exam  Constitutional: She is oriented to person, place, and time. She appears well-developed and well-nourished. No distress.  HENT:  Head: Normocephalic.  Eyes: Conjunctivae are normal.  Neck: Neck supple. No tracheal deviation present.  Cardiovascular: Normal rate, regular rhythm and normal heart sounds.   Pulmonary/Chest: Effort normal. No accessory muscle usage. No tachypnea. No respiratory distress.  Abdominal:  Soft. She exhibits no distension.  Neurological: She is alert and oriented to person, place, and time. No cranial nerve deficit. GCS eye subscore is 4. GCS verbal subscore is 5. GCS motor subscore is 6.  Skin: Skin is warm and dry. Rash (diffuse macular with a few small petechiae) noted.  Psychiatric: She has a normal mood and affect.    ED Course  Procedures (including critical care time) Labs Review Labs Reviewed  CBC WITH DIFFERENTIAL/PLATELET - Abnormal; Notable for the following:    Hemoglobin 10.2 (*)    HCT 33.3 (*)    MCH 25.6 (*)    RDW 20.2 (*)    Lymphs Abs 4.2 (*)    All other components within normal limits  COMPREHENSIVE METABOLIC PANEL - Abnormal; Notable for the following:    BUN 24 (*)    Creatinine, Ser 1.96 (*)    Calcium 8.3 (*)    Albumin 2.9 (*)    AST 13 (*)    ALT 7 (*)    GFR calc non Af Amer 24 (*)    GFR calc Af Amer 28 (*)    All other components within normal limits  CBG MONITORING, ED - Abnormal; Notable for the following:    Glucose-Capillary 103 (*)    All other components within normal limits  RETICULOCYTES  VITAMIN B12  FOLATE  IRON AND TIBC  FERRITIN  TYPE AND SCREEN    Imaging Review No results found. I have personally reviewed and evaluated these images and lab results as part of my medical decision-making.   EKG Interpretation None      MDM   Final diagnoses:  Dehydration    75 y.o. female  presents with diffuse macular rash and concern that she may have recurrent anemia or may require blood transfusion. CBC and anemia panel ordered for OP f/u. Not anemic here. No bili elevation or signs of liver failure. Not thrombocytopenic and rash does not appear to be overtly petechial but has a few scattered areas. BUN and Cr are slightly higher than baseline suggesting an element of prerenal azotemia. She has had some non-bloody diarrhea and likely dehydration for not keeping up with losses. Will provide some IV boluses here and she needs to have BMP rechecked in 2 days to ensure it is normalizing either here or at PCP office. Plan to follow up with PCP as needed and return precautions discussed for worsening or new concerning symptoms.     Leo Grosser, MD 01/08/16 [phone]

## 2016-01-08 LAB — IRON AND TIBC
Iron: 16 ug/dL — ABNORMAL LOW (ref 28–170)
SATURATION RATIOS: 8 % — AB (ref 10.4–31.8)
TIBC: 200 ug/dL — ABNORMAL LOW (ref 250–450)
UIBC: 184 ug/dL

## 2016-01-08 LAB — TYPE AND SCREEN
ABO/RH(D): O POS
ANTIBODY SCREEN: NEGATIVE

## 2016-01-08 LAB — FOLATE: FOLATE: 8.5 ng/mL (ref >5.9)

## 2016-01-08 LAB — FERRITIN: Ferritin: 106 ng/mL (ref 11–307)

## 2016-01-08 LAB — VITAMIN B12: VITAMIN B 12: 342 pg/mL (ref 180–914)

## 2016-01-08 NOTE — ED Notes (Signed)
Pt reports understanding of discharge information. No questions at time of discharge 

## 2016-01-08 NOTE — Telephone Encounter (Signed)
Abigail Butts called back, notified paperwork completed and faxed by Constance Holster.

## 2016-01-08 NOTE — Telephone Encounter (Signed)
Left message on voicemail for Pamela Hampton to call office.

## 2016-01-10 DIAGNOSIS — J449 Chronic obstructive pulmonary disease, unspecified: Secondary | ICD-10-CM | POA: Diagnosis not present

## 2016-01-11 DIAGNOSIS — D500 Iron deficiency anemia secondary to blood loss (chronic): Secondary | ICD-10-CM | POA: Diagnosis not present

## 2016-01-11 DIAGNOSIS — I10 Essential (primary) hypertension: Secondary | ICD-10-CM | POA: Diagnosis not present

## 2016-01-11 DIAGNOSIS — J441 Chronic obstructive pulmonary disease with (acute) exacerbation: Secondary | ICD-10-CM | POA: Diagnosis not present

## 2016-01-11 DIAGNOSIS — K922 Gastrointestinal hemorrhage, unspecified: Secondary | ICD-10-CM | POA: Diagnosis not present

## 2016-01-11 DIAGNOSIS — M810 Age-related osteoporosis without current pathological fracture: Secondary | ICD-10-CM | POA: Diagnosis not present

## 2016-01-11 DIAGNOSIS — S92252D Displaced fracture of navicular [scaphoid] of left foot, subsequent encounter for fracture with routine healing: Secondary | ICD-10-CM | POA: Diagnosis not present

## 2016-01-11 DIAGNOSIS — I2510 Atherosclerotic heart disease of native coronary artery without angina pectoris: Secondary | ICD-10-CM | POA: Diagnosis not present

## 2016-01-11 DIAGNOSIS — L89312 Pressure ulcer of right buttock, stage 2: Secondary | ICD-10-CM | POA: Diagnosis not present

## 2016-01-11 DIAGNOSIS — M150 Primary generalized (osteo)arthritis: Secondary | ICD-10-CM | POA: Diagnosis not present

## 2016-01-15 ENCOUNTER — Encounter: Payer: Self-pay | Admitting: Internal Medicine

## 2016-01-15 ENCOUNTER — Ambulatory Visit (INDEPENDENT_AMBULATORY_CARE_PROVIDER_SITE_OTHER): Payer: Commercial Managed Care - HMO | Admitting: Internal Medicine

## 2016-01-15 VITALS — BP 122/70 | HR 73 | Temp 98.2°F | Resp 20

## 2016-01-15 DIAGNOSIS — D6489 Other specified anemias: Secondary | ICD-10-CM

## 2016-01-15 DIAGNOSIS — I10 Essential (primary) hypertension: Secondary | ICD-10-CM | POA: Diagnosis not present

## 2016-01-15 DIAGNOSIS — Q2730 Arteriovenous malformation, site unspecified: Secondary | ICD-10-CM | POA: Diagnosis not present

## 2016-01-15 DIAGNOSIS — J9611 Chronic respiratory failure with hypoxia: Secondary | ICD-10-CM

## 2016-01-15 LAB — BASIC METABOLIC PANEL
BUN: 13 mg/dL (ref 6–23)
CHLORIDE: 103 meq/L (ref 96–112)
CO2: 30 mEq/L (ref 19–32)
Calcium: 9 mg/dL (ref 8.4–10.5)
Creatinine, Ser: 1.27 mg/dL — ABNORMAL HIGH (ref 0.40–1.20)
GFR: 43.87 mL/min — ABNORMAL LOW (ref >60.00)
Glucose, Bld: 73 mg/dL (ref 70–99)
POTASSIUM: 3.8 meq/L (ref 3.5–5.1)
SODIUM: 142 meq/L (ref 135–145)

## 2016-01-15 LAB — CBC WITH DIFFERENTIAL/PLATELET
BASOS PCT: 0.2 % (ref 0.0–3.0)
Basophils Absolute: 0 10*3/uL (ref 0.0–0.1)
EOS PCT: 1.3 % (ref 0.0–5.0)
Eosinophils Absolute: 0.1 10*3/uL (ref 0.0–0.7)
HEMATOCRIT: 32.6 % — AB (ref 36.0–46.0)
HEMOGLOBIN: 10.3 g/dL — AB (ref 12.0–15.0)
LYMPHS PCT: 40.1 % (ref 12.0–46.0)
Lymphs Abs: 4.3 10*3/uL — ABNORMAL HIGH (ref 0.7–4.0)
MCHC: 31.6 g/dL (ref 30.0–36.0)
MCV: 81.8 fl (ref 78.0–100.0)
MONOS PCT: 4.6 % (ref 3.0–12.0)
Monocytes Absolute: 0.5 10*3/uL (ref 0.1–1.0)
Neutro Abs: 5.8 10*3/uL (ref 1.4–7.7)
Neutrophils Relative %: 53.8 % (ref 43.0–77.0)
Platelets: 487 10*3/uL — ABNORMAL HIGH (ref 150.0–400.0)
RBC: 3.98 Mil/uL (ref 3.87–5.11)
RDW: 22.1 % — AB (ref 11.5–15.5)
WBC: 10.7 10*3/uL — AB (ref 4.0–10.5)

## 2016-01-15 NOTE — Patient Instructions (Signed)
Limit your sodium (Salt) intake  Return in 3 months for follow-up  Continue follow-up of your complete blood count monthly

## 2016-01-15 NOTE — Progress Notes (Signed)
Pre visit review using our clinic review tool, if applicable. No additional management support is needed unless otherwise documented below in the visit note.  Pt presented to the office with oxygen on at 4L/min via nasal cannula.

## 2016-01-15 NOTE — Progress Notes (Signed)
Subjective:    Patient ID: Pamela Hampton, female    DOB: 1940-09-17, 75 y.o.   MRN: [id number]  HPI  75 year old patient who has a history of AVMs and chronic anemia.  She was seen in the ED 8 days ago with dehydration.  She is improved but still having some anorexia.  She states she has had some mild diarrhea for the past 2 weeks.  She has advanced COPD which has been stable. Although her appetite is poor.  She states that she has been forcing fluids.  Past Medical History  Diagnosis Date  . Allergy     Rhinitis  . Depression   . GERD (gastroesophageal reflux disease)     Barrett's esophagus  . Hyperlipidemia   . Hypertension   . Osteoporosis   . Seizures ([place])   . Barrett's esophagus 07/19/2004  . Anemia   . Obesity   . Pneumonia   . Stroke ([place])   . Sleep apnea   . Hepatitis   . Arthritis   . Blood transfusion   . COPD (chronic obstructive pulmonary disease) (HCC)     wears 3 liters of o2 continious  . Emphysema of lung ([place])   . Ulcer   . Hiatal hernia   . 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 radiation. chemo.  . Impaired hearing   . History of oxygen administration     oxygen concentrator @ 4 l/m nasally 24/ 7     Social History   Social History  . Marital Status: Divorced    Spouse Name: N/A  . Number of Children: 4  . Years of Education: N/A   Occupational History  . disaled    Social History Main Topics  . Smoking status: Former Smoker -- 2.00 packs/day for 30 years    Types: Cigarettes    Quit date: 07/28/2002  . Smokeless tobacco: Never Used  . Alcohol Use: No  . Drug Use: No  . Sexual Activity: Not Currently   Other Topics Concern  . Not on file   Social History Narrative    Past Surgical History  Procedure Laterality Date  . Orif hip fracture Right   . Cholecystectomy    . Hemorrhoid surgery    . Total hip arthroplasty Right   . Abdominal hysterectomy    . 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 Abdominal laproscopy surgery    . Joint replacement    . Esophagogastroduodenoscopy (egd) with propofol N/A 12/21/2014    Procedure: ESOPHAGOGASTRODUODENOSCOPY (EGD) WITH PROPOFOL;  Surgeon: Inda Castle, MD;  Location: [facility];  Service: Endoscopy;  Laterality: N/A;    Family History  Problem Relation Age of Onset  . Heart disease Mother     MI  . Diabetes Mother   . Uterine cancer Mother   . Anemia Mother   . Hypertension Mother   . Stomach cancer Father   . Esophageal cancer Father   . Hypertension Father   . Diabetes Sister   . Anemia Sister   . Esophageal cancer Brother   . Hypertension Brother   . Diabetes Son   . Diabetes Son   . Brain cancer Brother   . Lung cancer Sister   . Liver cancer Sister   . Colon cancer Neg Hx   . Rectal cancer Neg Hx     No Known Allergies  Current Outpatient Prescriptions on File Prior to  Visit  Medication Sig Dispense Refill  . albuterol (PROAIR HFA) 108 (90 Base) MCG/ACT inhaler INHALE 2 PUFFS INTO THE LUNGS EVERY 4 HOURS AS NEEDED FOR SHORTNESS OF BREATH 8.5 g 12  . albuterol (PROVENTIL) (2.5 MG/3ML) 0.083% nebulizer solution INHALE THE CONTENTS OF 1 VIAL VIA NEBULIZER 4 TIMES A DAY. 360 mL 2  . beclomethasone (QVAR) 80 MCG/ACT inhaler Inhale 2 puffs into the lungs 2 (two) times daily. 8.7 g 6  . CRESTOR 20 MG tablet Take 1 tablet (20 mg total) by mouth every other day. 90 tablet 1  . escitalopram (LEXAPRO) 10 MG tablet Take 1 tablet (10 mg total) by mouth daily. 30 tablet 5  . Ferrous Sulfate Dried 200 (65 FE) MG TABS 1 tablet by mouth twice daily 30 tablet 6  . fluticasone (FLONASE) 50 MCG/ACT nasal spray PLACE 2 SPRAYS INTO BOTH NOSTRILS DAILY. 16 g 5  . furosemide (LASIX) 40 MG tablet Take 1 tablet (40 mg total) by mouth daily as needed for fluid.    Marland Kitchen ibandronate (BONIVA) 150 MG tablet TAKE IN THE MORNING WITH A FULL GLASS OF WATER, ON AN EMPTY STOMACH ONCE MONTHLY. 4 tablet  3  . ipratropium (ATROVENT) 0.02 % nebulizer solution INHALE THE CONTENTS OF 1 VIAL VIA NEBULIZER 4 TIMES A DAY. 300 mL 6  . omeprazole (PRILOSEC) 20 MG capsule TAKE 1 CAPSULE BY MOUTH ONCE DAILY. 30 capsule 6  . OXYGEN Inhale 4 L/min into the lungs continuous.    . polyethylene glycol powder (GLYCOLAX/MIRALAX) powder Take 17 g by mouth 2 (two) times daily as needed. (Patient taking differently: Take 17 g by mouth daily as needed for mild constipation. ) 3350 g 1  . potassium chloride SA (K-DUR,KLOR-CON) 20 MEQ tablet TAKE 1 TABLET BY MOUTH DAILY. 30 tablet 5  . roflumilast (DALIRESP) 500 MCG TABS tablet Take 1 tablet (500 mcg total) by mouth daily. 30 tablet 11  . sodium chloride (AYR) 0.65 % nasal spray Place 1 spray into the nose as needed for congestion.    . traZODone (DESYREL) 100 MG tablet Take 1 tablet (100 mg total) by mouth at bedtime. 90 tablet 1   No current facility-administered medications on file prior to visit.    BP 122/70 mmHg  Pulse 73  Temp(Src) 98.2 F (36.8 C) (Oral)  Resp 20  SpO2 97%     Review of Systems  Constitutional: Positive for appetite change and fatigue.  HENT: Negative for congestion, dental problem, hearing loss, rhinorrhea, sinus pressure, sore throat and tinnitus.   Eyes: Negative for pain, discharge and visual disturbance.  Respiratory: Negative for cough and shortness of breath.   Cardiovascular: Negative for chest pain, palpitations and leg swelling.  Gastrointestinal: Positive for diarrhea. Negative for nausea, vomiting, abdominal pain, constipation, blood in stool and abdominal distention.  Genitourinary: Negative for dysuria, urgency, frequency, hematuria, flank pain, vaginal bleeding, vaginal discharge, difficulty urinating, vaginal pain and pelvic pain.  Musculoskeletal: Negative for joint swelling, arthralgias and gait problem.  Skin: Positive for rash.  Neurological: Negative for dizziness, syncope, speech difficulty, weakness, numbness  and headaches.  Hematological: Negative for adenopathy.  Psychiatric/Behavioral: Negative for behavioral problems, dysphoric mood and agitation. The patient is not nervous/anxious.        Objective:   Physical Exam  Constitutional: She is oriented to person, place, and time. She appears well-developed and well-nourished.  No distress.  No tachycardia O2 saturation 97% on supplemental O2  HENT:  Head: Normocephalic.  Right Ear: External ear  normal.  Left Ear: External ear normal.  Mouth/Throat: Oropharynx is clear and moist.  Eyes: Conjunctivae and EOM are normal. Pupils are equal, round, and reactive to light.  Neck: Normal range of motion. Neck supple. No thyromegaly present.  Cardiovascular: Normal rate, regular rhythm, normal heart sounds and intact distal pulses.   Pulmonary/Chest: Effort normal and breath sounds normal.  Abdominal: Soft. Bowel sounds are normal. She exhibits no mass. There is no tenderness.  Obese, soft and nontender  Musculoskeletal: Normal range of motion. She exhibits no edema.  Lymphadenopathy:    She has no cervical adenopathy.  Neurological: She is alert and oriented to person, place, and time.  Skin: Skin is warm and dry. No rash noted.  Psychiatric: She has a normal mood and affect. Her behavior is normal.          Assessment & Plan:   History of dehydration.  Will check a basic metabolic profile Anemia.  Check follow-up CBC Essential hypertension, stable Severe COPD.  Follow-up pulmonary  Continue iron supplementation CBC every month  Nyoka Cowden, MD

## 2016-01-26 DIAGNOSIS — J449 Chronic obstructive pulmonary disease, unspecified: Secondary | ICD-10-CM | POA: Diagnosis not present

## 2016-01-26 DIAGNOSIS — I10 Essential (primary) hypertension: Secondary | ICD-10-CM | POA: Diagnosis not present

## 2016-01-26 DIAGNOSIS — J9611 Chronic respiratory failure with hypoxia: Secondary | ICD-10-CM | POA: Diagnosis not present

## 2016-02-05 DIAGNOSIS — J9611 Chronic respiratory failure with hypoxia: Secondary | ICD-10-CM | POA: Diagnosis not present

## 2016-02-07 DIAGNOSIS — J9611 Chronic respiratory failure with hypoxia: Secondary | ICD-10-CM | POA: Diagnosis not present

## 2016-02-08 ENCOUNTER — Telehealth: Payer: Self-pay | Admitting: Internal Medicine

## 2016-02-08 NOTE — Telephone Encounter (Addendum)
The FMLA paperwork dr filled out for pt is correct EXCEPT for question #4 which needs to say that daughter wendy went back to work on Dec 10, 2015.  I have printed paperwork from [employer].

## 2016-02-08 NOTE — Telephone Encounter (Signed)
FMLA corrected and faxed back.

## 2016-02-09 DIAGNOSIS — J449 Chronic obstructive pulmonary disease, unspecified: Secondary | ICD-10-CM | POA: Diagnosis not present

## 2016-02-12 ENCOUNTER — Other Ambulatory Visit: Payer: Commercial Managed Care - HMO

## 2016-02-12 DIAGNOSIS — J9611 Chronic respiratory failure with hypoxia: Secondary | ICD-10-CM | POA: Diagnosis not present

## 2016-02-13 ENCOUNTER — Other Ambulatory Visit: Payer: Commercial Managed Care - HMO

## 2016-02-14 DIAGNOSIS — J9611 Chronic respiratory failure with hypoxia: Secondary | ICD-10-CM | POA: Diagnosis not present

## 2016-02-18 ENCOUNTER — Other Ambulatory Visit (INDEPENDENT_AMBULATORY_CARE_PROVIDER_SITE_OTHER): Payer: Commercial Managed Care - HMO

## 2016-02-18 ENCOUNTER — Other Ambulatory Visit: Payer: Self-pay | Admitting: Internal Medicine

## 2016-02-18 DIAGNOSIS — D509 Iron deficiency anemia, unspecified: Secondary | ICD-10-CM | POA: Diagnosis not present

## 2016-02-18 DIAGNOSIS — Q2730 Arteriovenous malformation, site unspecified: Secondary | ICD-10-CM | POA: Diagnosis not present

## 2016-02-18 LAB — CBC WITH DIFFERENTIAL/PLATELET
Basophils Relative: 2 % (ref 0.0–3.0)
EOS PCT: 6 % — AB (ref 0.0–5.0)
HEMATOCRIT: 29.8 % — AB (ref 36.0–46.0)
Hemoglobin: 9.2 g/dL — ABNORMAL LOW (ref 12.0–15.0)
Lymphocytes Relative: 34 % (ref 12.0–46.0)
MCHC: 30.8 g/dL (ref 30.0–36.0)
MCV: 85.8 fl (ref 78.0–100.0)
MONOS PCT: 13 % — AB (ref 3.0–12.0)
Neutrophils Relative %: 45 % (ref 43.0–77.0)
RBC: 3.47 Mil/uL — ABNORMAL LOW (ref 3.87–5.11)
RDW: 22.2 % — AB (ref 11.5–15.5)
WBC: 9.4 10*3/uL (ref 4.0–10.5)

## 2016-02-19 ENCOUNTER — Telehealth: Payer: Self-pay | Admitting: General Practice

## 2016-02-19 NOTE — Telephone Encounter (Signed)
Lab results given to patient.  Patient instructed patient to repeat labs in 1 month.  Patient verbalized understanding.

## 2016-02-28 DIAGNOSIS — I10 Essential (primary) hypertension: Secondary | ICD-10-CM | POA: Diagnosis not present

## 2016-02-29 DIAGNOSIS — I10 Essential (primary) hypertension: Secondary | ICD-10-CM | POA: Diagnosis not present

## 2016-03-03 DIAGNOSIS — I10 Essential (primary) hypertension: Secondary | ICD-10-CM | POA: Diagnosis not present

## 2016-03-04 DIAGNOSIS — I10 Essential (primary) hypertension: Secondary | ICD-10-CM | POA: Diagnosis not present

## 2016-03-05 DIAGNOSIS — I10 Essential (primary) hypertension: Secondary | ICD-10-CM | POA: Diagnosis not present

## 2016-03-06 DIAGNOSIS — I10 Essential (primary) hypertension: Secondary | ICD-10-CM | POA: Diagnosis not present

## 2016-03-07 DIAGNOSIS — I10 Essential (primary) hypertension: Secondary | ICD-10-CM | POA: Diagnosis not present

## 2016-03-10 DIAGNOSIS — I10 Essential (primary) hypertension: Secondary | ICD-10-CM | POA: Diagnosis not present

## 2016-03-11 ENCOUNTER — Encounter: Payer: Self-pay | Admitting: Internal Medicine

## 2016-03-11 ENCOUNTER — Other Ambulatory Visit (INDEPENDENT_AMBULATORY_CARE_PROVIDER_SITE_OTHER): Payer: Commercial Managed Care - HMO

## 2016-03-11 ENCOUNTER — Ambulatory Visit (INDEPENDENT_AMBULATORY_CARE_PROVIDER_SITE_OTHER): Payer: Commercial Managed Care - HMO | Admitting: Internal Medicine

## 2016-03-11 VITALS — BP 134/60 | HR 65 | Ht 64.0 in | Wt 145.6 lb

## 2016-03-11 DIAGNOSIS — J9611 Chronic respiratory failure with hypoxia: Secondary | ICD-10-CM

## 2016-03-11 DIAGNOSIS — J449 Chronic obstructive pulmonary disease, unspecified: Secondary | ICD-10-CM | POA: Diagnosis not present

## 2016-03-11 DIAGNOSIS — R634 Abnormal weight loss: Secondary | ICD-10-CM | POA: Diagnosis not present

## 2016-03-11 DIAGNOSIS — I10 Essential (primary) hypertension: Secondary | ICD-10-CM | POA: Diagnosis not present

## 2016-03-11 DIAGNOSIS — Z862 Personal history of diseases of the blood and blood-forming organs and certain disorders involving the immune mechanism: Secondary | ICD-10-CM

## 2016-03-11 DIAGNOSIS — Z87891 Personal history of nicotine dependence: Secondary | ICD-10-CM | POA: Insufficient documentation

## 2016-03-11 LAB — CBC WITH DIFFERENTIAL/PLATELET
BASOS ABS: 0.1 10*3/uL (ref 0.0–0.1)
Basophils Relative: 0.6 % (ref 0.0–3.0)
Eosinophils Absolute: 0.2 10*3/uL (ref 0.0–0.7)
Eosinophils Relative: 1.6 % (ref 0.0–5.0)
HCT: 32.8 % — ABNORMAL LOW (ref 36.0–46.0)
Hemoglobin: 10.3 g/dL — ABNORMAL LOW (ref 12.0–15.0)
LYMPHS ABS: 3.4 10*3/uL (ref 0.7–4.0)
Lymphocytes Relative: 35.9 % (ref 12.0–46.0)
MCHC: 31.5 g/dL (ref 30.0–36.0)
MCV: 86.2 fl (ref 78.0–100.0)
MONO ABS: 0.8 10*3/uL (ref 0.1–1.0)
Monocytes Relative: 9 % (ref 3.0–12.0)
NEUTROS ABS: 5 10*3/uL (ref 1.4–7.7)
NEUTROS PCT: 52.9 % (ref 43.0–77.0)
PLATELETS: 366 10*3/uL (ref 150.0–400.0)
RBC: 3.81 Mil/uL — AB (ref 3.87–5.11)
RDW: 18.4 % — ABNORMAL HIGH (ref 11.5–15.5)
WBC: 9.4 10*3/uL (ref 4.0–10.5)

## 2016-03-11 LAB — BASIC METABOLIC PANEL
BUN: 15 mg/dL (ref 6–23)
CALCIUM: 9.6 mg/dL (ref 8.4–10.5)
CHLORIDE: 102 meq/L (ref 96–112)
CO2: 35 meq/L — AB (ref 19–32)
Creatinine, Ser: 1.02 mg/dL (ref 0.40–1.20)
GFR: 56.48 mL/min — ABNORMAL LOW (ref >60.00)
Glucose, Bld: 94 mg/dL (ref 70–99)
Potassium: 4 mEq/L (ref 3.5–5.1)
SODIUM: 143 meq/L (ref 135–145)

## 2016-03-11 LAB — HEPATIC FUNCTION PANEL
ALK PHOS: 66 U/L (ref 39–117)
ALT: 4 U/L (ref 0–35)
AST: 10 U/L (ref 0–37)
Albumin: 3.4 g/dL — ABNORMAL LOW (ref 3.5–5.2)
BILIRUBIN DIRECT: 0.1 mg/dL (ref 0.0–0.3)
BILIRUBIN TOTAL: 0.2 mg/dL (ref 0.2–1.2)
TOTAL PROTEIN: 7.3 g/dL (ref 6.0–8.3)

## 2016-03-11 NOTE — Progress Notes (Signed)
Subjective:     Patient ID: Pamela Hampton, female   DOB: 02-Sep-1940, 75 y.o.   MRN: [id number]  HPI   PCP Nyoka Cowden, MD  # 60 pack smoker quit 2004.   # Frequent UTI. Gyn cancer 1998  -s/p chemo Rx and. xrt = strong family hx of cancer  #lung imagin  - Feb 2013: No evicende of cancer on CT Chest - dec 2013 0 clear cxrf  #Obesity  - >  30# weight gain since 2010-2012  - Body mass index is 34.86 kg/(m^2).   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 Ratio 51 and DLCO 6.7/30\ Status post pulmonary rehabilitation summer 2013   #AECOPD - September 2013 treated in the office with doxycycline and prednisone on 04/14/2012 - December 2013 4 emergency room treatment with prednisone and antibiotics - July 2014 office treatment with Levaquin and prednisone - Nov 2014: office Rx - March 2015: office telpehone Rx - doxy/[pred - July 2015 - telephone RX pred alone - heat related aecopd - dec 2015 - telephone Rx with pred alone  ...................... OV 03/13/2014  Chief Complaint  Patient presents with  . Follow-up    Pt states her breathing is unchanged. Pt c/o DOE, prod cough with green and white. pt states her cough is worse in monring and when outdoors. Pt states when she becomes very dyspneic then she will experience mid sternal  CP. Pt states she received 2 units of blood in 01/2014 d/t hgb at 7.5   FU COPD with cor pulmonale  - doing well. NO admissions since nov/dec 2014 when last seen. [place] [facility] 2015: Rx over phone for aecopd. Continues qvar and duoneb without a problem. On 4L o2.   Past hx: has chronic anemia and 4 wweeks ago PCP Nyoka Cowden, MD gave 2 units prbc for hgg 7s. Feels better after that. Also reports chronic nasal stuffiness  and head cold and wants ENT referral   OV 11/06/2014  Chief Complaint  Patient presents with  . Follow-up    Pt c/o prod cough with yellow mucus, SOB with activity. Right sided chest tightness occasionally. Denies any chest congestion, n/v/d. Wants to talk about going back to symbicort.   FU COPD with cor pulmonarle  - last seen August 2015. Since that she called in once in December 2015 for COPD exacerbation and received prednisone alonefor an exacerbation. She is on duo neb and Qvar and according to her and her female companion who is with her today she is fairly compliant with this. 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 She got some injection into the eye and since then she's had partial recovery of vision. Despite this she is able to see and uses inhalers   Labs Jan 2016 hgb 9.5gm% which appears stable   OV 06/07/2015  Chief Complaint  Patient presents with  . Follow-up    Pt c/o sinus congestion, PND, prod cough with brown mucus. Pt states her SOB is at baseline. Pt denies CP/tightness.    Follow-up COPD with chronic hypoxemia and  respiratory failure and cor pulmonale  - Last seen in April 2016. Since then his called in for an exacerbation. Currently she presents with a female companion. She is on chronic 4 L oxygen. She is on duo neb and Qvar. They both report compliance. She reports good stable health. For the last week she's having increased sinus drainage that is dirty brown in color. She does not know her baseline color but she does note that this is a change from her baseline. There is no fever or worsening cough or wheezing or chest tightness. She otherwise feels good in terms of energy level. This no hemoptysis. This no edema getting worse.  Chest x-ray April 2016 reported to be clear other than  large hiatal hernia and COPD. Not personally visualized   OV 12/04/2015  Chief Complaint  Patient presents with  . Follow-up    Pt states her breathing has worsened since last OV. Pt c/o prod cough with yellow mucus. Pt c/o nasal congestion.    Follow-up COPD with chronic hypoxemia and respiratory failure and cor pulmonale  She presents with the family. Last seen in November 2016. Since then she's had one or 2 exacerbations. She also reports having had some rash seen by primary care physician but this is resolved. She is also dealing with chronic sinus issues and dry. I notice that she's not using Nettie pot saline wash which I recommended. She feels more deconditioned than more chronically worsening shortness of breath and in the past. She is due to get home physical therapy. She has chronic fatigue. She hopes home physical therapy will help her. There is no baseline edema. Currently is the last few days of a COPD exacerbation treatment and she is feeling better in that sense. She is open to trying Roflumilast wall preventing COPD exacerbations.    OV 03/11/2016  Chief Complaint  Patient presents with  . Follow-up    Pt states she feels her breathing has worsened since last OV. Pt c/o loss of appetite and prod cough with clear mucus. Pt denies CP/tightness and f/c/s.     Follow-up COPD advanced with chronic hypoxemic respiratory failure on oxygen high symptom burden on Scope  Last visit May 2017. Due to recurrent exacerbations be started her on roflumilast. After that according to chart review she was seen by primary care physician for iron deficiency anemia. She also in June 2017 had an admission for dehydration. In talking to her apparently her daughter thinks all the side effects are coming because of the roflumilast. In addition patient has lost significant amount of weight. Since last visit when she was 164 poundsshe has lost nearly 20 pounds and is currently at 145 pounds. However  patient categorically states that the side effects are not because of roflumilast. She denies any ongoing nausea vomiting. She does have a large hiatal herniaand she does not think the carina splaying any role in makingher lose weight. She says she was losing weight even before starting roflulimilast. In fact in April 2016 she weighed 187 pounds so she at least 17 pound weight loss in 1 year prior to starting rolflumilast. She absolutely refuses to stop roflumilast even for  first time limited trial. Feels COPD is improved.    CAT COPD Symptom & Quality of Life Score (GSK trademark) 0 is no burden. 5 is highest burden 04/14/2012  08/03/2012  02/14/2013  06/10/2013   Never Cough -> Cough all the time 1 4, baseline 2 4  No phlegm in chest ->  Chest is full of phlegm 5 3, baseline 5 4  No chest tightness -> Chest feels very tight 1 0, /imrpoved 4 4  No dyspnea for 1 flight stairs/hill -> Very dyspneic for 1 flight of stairs 5 5, "improved" 5 5  No limitations for ADL at home -> Very limited with ADL at home 3 5 5 5   Confident leaving home -> Not at all confident leaving home 0 2 3 4   Sleep soundly -> Do not sleep soundly because of lung condition 3 4 4 5   Lots of Energy -> No energy at all 3 5 5 5   TOTAL Score (max 40)  21 28 32 36      has a past medical history of Allergy; Anemia; Arthritis; Barrett's esophagus (07/19/2004); Blood transfusion; COPD (chronic obstructive pulmonary disease) ([place]); Depression; Emphysema of lung ([place]); GERD (gastroesophageal reflux disease); Hepatitis; Hiatal hernia; History of oxygen administration; Hyperlipidemia; Hypertension; Impaired hearing; Obesity; Osteoporosis; Pneumonia; Seizures ([place]); Sleep apnea; Stroke [facility]); Ulcer; and 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   reports that she quit smoking about 13 years ago. Her smoking use included Cigarettes. She has a 60.00 pack-year smoking history. She has never used smokeless tobacco.  Past Surgical History:  Procedure Laterality  Date  . ABDOMINAL HYSTERECTOMY    . abdominal laproscopy surgery    . CHOLECYSTECTOMY    . ESOPHAGOGASTRODUODENOSCOPY (EGD) WITH PROPOFOL N/A 12/21/2014   Procedure: ESOPHAGOGASTRODUODENOSCOPY (EGD) WITH PROPOFOL;  Surgeon: Inda Castle, MD;  Location: [facility];  Service: Endoscopy;  Laterality: N/A;  . HEMORRHOID SURGERY    . JOINT REPLACEMENT    . ORIF HIP FRACTURE Right   . TOTAL HIP ARTHROPLASTY Right   . 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 Influenza Split 05/07/2011, 04/14/2012, 06/06/2013  . Influenza Whole 07/08/2007, 04/25/2008, 06/12/2009  . Influenza, High Dose Seasonal PF 08/07/2014, 04/24/2015  . Pneumococcal Conjugate-13 08/07/2014  . Pneumococcal Polysaccharide-23 07/07/2011  . Tetanus 04/12/2013  . Zoster 04/19/2012    Family History  Problem Relation Age of Onset  . Heart disease Mother     MI  . Diabetes Mother   . Uterine cancer Mother   . Anemia Mother   . Hypertension Mother   . Stomach cancer Father   . Esophageal cancer Father   . Hypertension Father   . Diabetes Sister   . Anemia Sister   . Esophageal cancer Brother   . Hypertension Brother   . Diabetes Son   . Diabetes Son   . Brain cancer Brother   . Lung cancer Sister   . Liver cancer Sister   . Colon cancer Neg Hx   . Rectal cancer Neg Hx      Current Outpatient Prescriptions:  .  albuterol (PROAIR HFA) 108 (90 Base) MCG/ACT inhaler, INHALE 2 PUFFS INTO THE LUNGS EVERY 4 HOURS AS NEEDED FOR SHORTNESS OF BREATH, Disp: 8.5 g, Rfl: 12 .  albuterol (PROVENTIL) (2.5 MG/3ML) 0.083% nebulizer solution, INHALE THE CONTENTS OF 1 VIAL VIA NEBULIZER 4 TIMES A DAY., Disp: 360 mL, Rfl: 2 .  beclomethasone (QVAR) 80 MCG/ACT inhaler, Inhale 2 puffs into the lungs 2 (two) times daily., Disp: 8.7 g, Rfl: 6 .  CRESTOR 20 MG tablet, Take 1 tablet (20 mg total)  by mouth every other day., Disp: 90 tablet, Rfl: 1 .  escitalopram (LEXAPRO) 10 MG tablet, Take 1 tablet (10  mg total) by mouth daily., Disp: 30 tablet, Rfl: 5 .  Ferrous Sulfate Dried 200 (65 FE) MG TABS, 1 tablet by mouth twice daily (Patient taking differently: 1 tablet by mouth daily), Disp: 30 tablet, Rfl: 6 .  fluticasone (FLONASE) 50 MCG/ACT nasal spray, PLACE 2 SPRAYS INTO BOTH NOSTRILS DAILY., Disp: 16 g, Rfl: 5 .  furosemide (LASIX) 40 MG tablet, Take 1 tablet (40 mg total) by mouth daily as needed for fluid., Disp: , Rfl:  .  ibandronate (BONIVA) 150 MG tablet, TAKE IN THE MORNING WITH A FULL GLASS OF WATER, ON AN EMPTY STOMACH ONCE MONTHLY., Disp: 4 tablet, Rfl: 3 .  ipratropium (ATROVENT) 0.02 % nebulizer solution, INHALE THE CONTENTS OF 1 VIAL VIA NEBULIZER 4 TIMES A DAY., Disp: 300 mL, Rfl: 6 .  omeprazole (PRILOSEC) 20 MG capsule, TAKE 1 CAPSULE BY MOUTH ONCE DAILY., Disp: 90 capsule, Rfl: 2 .  OXYGEN, Inhale 4 L/min into the lungs continuous., Disp: , Rfl:  .  polyethylene glycol powder (GLYCOLAX/MIRALAX) powder, Take 17 g by mouth 2 (two) times daily as needed. (Patient taking differently: Take 17 g by mouth daily as needed for mild constipation. ), Disp: 3350 g, Rfl: 1 .  potassium chloride SA (K-DUR,KLOR-CON) 20 MEQ tablet, TAKE 1 TABLET BY MOUTH DAILY., Disp: 30 tablet, Rfl: 5 .  roflumilast (DALIRESP) 500 MCG TABS tablet, Take 1 tablet (500 mcg total) by mouth daily., Disp: 30 tablet, Rfl: 11 .  sodium chloride (AYR) 0.65 % nasal spray, Place 1 spray into the nose as needed for congestion., Disp: , Rfl:  .  traZODone (DESYREL) 100 MG tablet, Take 1 tablet (100 mg total) by mouth at bedtime., Disp: 90 tablet, Rfl: 1   Review of Systems     Objective:   Physical Exam  Constitutional: She is oriented to person, place, and time. She appears well-developed and well-nourished. No distress.  Seems to lost significant amount of weight  HENT:  Head: Normocephalic and atraumatic.   Right Ear: External ear normal.  Left Ear: External ear normal.  Mouth/Throat: Oropharynx is clear and moist. No oropharyngeal exudate.  Oxygen on  Eyes: Conjunctivae and EOM are normal. Pupils are equal, round, and reactive to light. Right eye exhibits no discharge. Left eye exhibits no discharge. No scleral icterus.  Neck: Normal range of motion. Neck supple. No JVD present. No tracheal deviation present. No thyromegaly present.  Cardiovascular: Normal rate, regular rhythm, normal heart sounds and intact distal pulses.  Exam reveals no gallop and no friction rub.   No murmur heard. Pulmonary/Chest: Effort normal and breath sounds normal. No respiratory distress. She has no wheezes. She has no rales. She exhibits no tenderness.  Barrel chest without wheeze  Abdominal: Soft. Bowel sounds are normal. She exhibits no distension and no mass. There is no tenderness. There is no rebound and no guarding.  Musculoskeletal: Normal range of motion. She exhibits no edema or tenderness.  Sitting in wheel chair  Lymphadenopathy:    She has no cervical adenopathy.  Neurological: She is alert and oriented to person, place, and time. She has normal reflexes. No cranial nerve deficit. She exhibits normal muscle tone. Coordination normal.  Skin: Skin is warm and dry. No rash noted. She is not diaphoretic. No erythema. No pallor.  Psychiatric: She has a normal mood and affect. Her behavior is normal. Judgment and thought content normal.  Vitals reviewed.   Vitals:   03/11/16 1405  BP: 134/60  Pulse: 65  SpO2: 98%  Weight: 145 lb 9.6 oz (66 kg)  Height: 5\' 4"  (1.626 m)   187 pounds in April 2016 164 pounds in May 2017       Assessment:       ICD-9-CM ICD-10-CM   1. COPD, severe ([place]) 496 J44.9   2. Chronic respiratory failure with hypoxia (HCC) 518.83 J96.11    799.02    3. Loss of weight 783.21 R63.4   4. History of anemia V12.3 Z86.2   5. Stopped smoking with greater than 40 pack year  history V15.82 Z87.891        Plan:      COPD itself is stable  It is possible that weight loss and some of the GI side effects might be related to Roflumilast es in setting of hiatal hernia; reports indicate at least 10% of patients lose weight on drug  Respect the fact that using this medication is helping you prevent flareups and you do not think the side effects are from Roflumilast  Respect the fact do not want to give her trial off this medication to see if your weight would improve  Plan - Do complete blood count, chemistry, liver function test because of significant weight loss -= Do CT scan of the chest without contrast - Continue oxygen and his COPD medications including roflumilast  Follow-up = We will call with the results = return to see [place] in 1 month or sooner review test results; can see nurse practitioner     Dr. Brand Males, M.D., [facility].C.P Pulmonary and Critical Care Medicine Staff Physician [facility] Pager: [phone], If no answer or between  15:00h - 7:00h: call [phone]  03/11/2016 2:27 PM

## 2016-03-11 NOTE — Patient Instructions (Addendum)
ICD-9-CM ICD-10-CM   1. COPD, severe ([place]) 496 J44.9   2. Chronic respiratory failure with hypoxia (HCC) 518.83 J96.11    799.02    3. Loss of weight 783.21 R63.4   4. History of anemia V12.3 Z86.2   5. Stopped smoking with greater than 40 pack year history V15.82 Z87.891    COPD itself is stable  It is possible that weight loss and some of the GI side effects might be related to Roflumilast es in setting of hiatal hernia; reports indicate at least 10% of patients lose weight on drug  Respect the fact that using this medication is helping you prevent flareups and you do not think the side effects are from Roflumilast  Respect the fact do not want to give her trial off this medication to see if your weight would improve  Plan - Do complete blood count, chemistry, liver function test because of significant weight loss -= Do CT scan of the chest without contrast = continue oxygen and his COPD medications indlucing roflumilast  Follow-up = We will call with the results = return to see [place] in 1 month or sooner review test results; can see nurse practitioner

## 2016-03-12 DIAGNOSIS — I10 Essential (primary) hypertension: Secondary | ICD-10-CM | POA: Diagnosis not present

## 2016-03-13 ENCOUNTER — Ambulatory Visit (INDEPENDENT_AMBULATORY_CARE_PROVIDER_SITE_OTHER)
Admission: RE | Admit: 2016-03-13 | Discharge: 2016-03-13 | Disposition: A | Discharge status: Home / Self Care | Payer: Commercial Managed Care - HMO | Source: Ambulatory Visit | Attending: Internal Medicine | Admitting: Internal Medicine

## 2016-03-13 DIAGNOSIS — J9611 Chronic respiratory failure with hypoxia: Secondary | ICD-10-CM | POA: Diagnosis not present

## 2016-03-13 DIAGNOSIS — R634 Abnormal weight loss: Secondary | ICD-10-CM

## 2016-03-13 DIAGNOSIS — J449 Chronic obstructive pulmonary disease, unspecified: Secondary | ICD-10-CM | POA: Diagnosis not present

## 2016-03-13 DIAGNOSIS — Z87891 Personal history of nicotine dependence: Secondary | ICD-10-CM

## 2016-03-13 DIAGNOSIS — Z862 Personal history of diseases of the blood and blood-forming organs and certain disorders involving the immune mechanism: Secondary | ICD-10-CM

## 2016-03-13 DIAGNOSIS — R911 Solitary pulmonary nodule: Secondary | ICD-10-CM | POA: Diagnosis not present

## 2016-03-13 DIAGNOSIS — I10 Essential (primary) hypertension: Secondary | ICD-10-CM | POA: Diagnosis not present

## 2016-03-14 ENCOUNTER — Telehealth: Payer: Self-pay | Admitting: Internal Medicine

## 2016-03-14 DIAGNOSIS — I10 Essential (primary) hypertension: Secondary | ICD-10-CM | POA: Diagnosis not present

## 2016-03-14 NOTE — Telephone Encounter (Signed)
   Let Pamela Hampton know that labs show  1. Anemia - unchanged from baseline  2. CT chest - large hiatal hernia and can explain weight loss  3. RLL nodule 73mm - suspicion for early stage lung cancer but too small to biopsy or do anything about  4. Keep FU mid sept to see how she is doing with weight loss  Dr. Brand Males, M.D., [facility].C.P Pulmonary and Critical Care Medicine Staff Physician [place] [facility] Pager: [phone], If no answer or between  15:00h - 7:00h: call [phone]  03/14/2016 2:36 AM      PULMONARY No results for input(s): PHART, PCO2ART, PO2ART, HCO3, TCO2, O2SAT in the last 168 hours.  Invalid input(s): PCO2, PO2  CBC  Recent Labs Lab 03/11/16 1446  HGB 10.3*  HCT 32.8*  WBC 9.4  PLT 366.0    COAGULATION No results for input(s): INR in the last 168 hours.  CARDIAC  No results for input(s): TROPONINI in the last 168 hours. No results for input(s): PROBNP in the last 168 hours.   CHEMISTRY  Recent Labs Lab 03/11/16 1446  NA 143  K 4.0  CL 102  CO2 35*  GLUCOSE 94  BUN 15  CREATININE 1.02  CALCIUM 9.6   Estimated Creatinine Clearance: 46.6 mL/min (by C-G formula based on SCr of 1.02 mg/dL).   LIVER  Recent Labs Lab 03/11/16 1446  AST 10  ALT 4  ALKPHOS 66  BILITOT 0.2  PROT 7.3  ALBUMIN 3.4*     INFECTIOUS No results for input(s): LATICACIDVEN, PROCALCITON in the last 168 hours.   ENDOCRINE CBG (last 3)  No results for input(s): GLUCAP in the last 72 hours.       IMAGING x48h  - image(s) personally visualized  -   highlighted in bold Ct Chest Wo Contrast  Result Date: 03/13/2016 CLINICAL DATA:  Weight loss with decreased appetite. EXAM: CT CHEST WITHOUT CONTRAST TECHNIQUE: Multidetector CT imaging of the chest was performed following the standard protocol without IV contrast. COMPARISON:  09/16/2011 FINDINGS: Cardiovascular: The heart size is normal. No  pericardial effusion. Coronary artery calcification is noted. Atherosclerotic calcification is noted in the wall of the throracic aorta. Mediastinum/Nodes: No mediastinal lymphadenopathy. No evidence for gross hilar lymphadenopathy although assessment is limited by the lack of intravenous contrast on today's study. There is no axillary lymphadenopathy. The esophagus has normal imaging features. Moderate to large hiatal hernia noted. Lungs/Pleura: Paraseptal and centrilobular emphysema noted bilaterally. 7 mm spiculated nodule identified right lower lobe image 85 series 3. No focal airspace consolidation. No pulmonary edema or pleural effusion. Upper Abdomen: Status post splenectomy. Possible nonobstructing stone upper pole left kidney. Musculoskeletal: Bone windows reveal no worrisome lytic or sclerotic osseous lesions. IMPRESSION: 1. Interval development of a 7 mm spiculated nodule right lower lobe current imaging features are highly suspicious for primary bronchogenic neoplasm. 2. Emphysema. 3. Moderate to large hiatal hernia again noted. 4. Coronary artery and thoracic aortic atherosclerosis. Electronically Signed   By: Misty Stanley M.D.   On: 03/13/2016 16:21

## 2016-03-14 NOTE — Telephone Encounter (Signed)
Called and spoke to pt. Informed her of the results and recs per MR. Pt verbalized understanding and denied any further questions or concerns at this time.   

## 2016-03-17 DIAGNOSIS — I10 Essential (primary) hypertension: Secondary | ICD-10-CM | POA: Diagnosis not present

## 2016-03-18 DIAGNOSIS — I10 Essential (primary) hypertension: Secondary | ICD-10-CM | POA: Diagnosis not present

## 2016-03-19 DIAGNOSIS — I10 Essential (primary) hypertension: Secondary | ICD-10-CM | POA: Diagnosis not present

## 2016-03-20 DIAGNOSIS — I10 Essential (primary) hypertension: Secondary | ICD-10-CM | POA: Diagnosis not present

## 2016-03-21 DIAGNOSIS — I10 Essential (primary) hypertension: Secondary | ICD-10-CM | POA: Diagnosis not present

## 2016-03-24 DIAGNOSIS — I10 Essential (primary) hypertension: Secondary | ICD-10-CM | POA: Diagnosis not present

## 2016-03-25 DIAGNOSIS — I10 Essential (primary) hypertension: Secondary | ICD-10-CM | POA: Diagnosis not present

## 2016-03-26 DIAGNOSIS — I10 Essential (primary) hypertension: Secondary | ICD-10-CM | POA: Diagnosis not present

## 2016-03-27 DIAGNOSIS — I10 Essential (primary) hypertension: Secondary | ICD-10-CM | POA: Diagnosis not present

## 2016-03-28 DIAGNOSIS — I10 Essential (primary) hypertension: Secondary | ICD-10-CM | POA: Diagnosis not present

## 2016-03-31 DIAGNOSIS — I10 Essential (primary) hypertension: Secondary | ICD-10-CM | POA: Diagnosis not present

## 2016-04-01 DIAGNOSIS — I10 Essential (primary) hypertension: Secondary | ICD-10-CM | POA: Diagnosis not present

## 2016-04-02 DIAGNOSIS — I10 Essential (primary) hypertension: Secondary | ICD-10-CM | POA: Diagnosis not present

## 2016-04-03 DIAGNOSIS — I10 Essential (primary) hypertension: Secondary | ICD-10-CM | POA: Diagnosis not present

## 2016-04-04 DIAGNOSIS — I10 Essential (primary) hypertension: Secondary | ICD-10-CM | POA: Diagnosis not present

## 2016-04-07 DIAGNOSIS — I10 Essential (primary) hypertension: Secondary | ICD-10-CM | POA: Diagnosis not present

## 2016-04-08 DIAGNOSIS — I10 Essential (primary) hypertension: Secondary | ICD-10-CM | POA: Diagnosis not present

## 2016-04-09 DIAGNOSIS — I10 Essential (primary) hypertension: Secondary | ICD-10-CM | POA: Diagnosis not present

## 2016-04-10 DIAGNOSIS — I10 Essential (primary) hypertension: Secondary | ICD-10-CM | POA: Diagnosis not present

## 2016-04-11 ENCOUNTER — Ambulatory Visit (INDEPENDENT_AMBULATORY_CARE_PROVIDER_SITE_OTHER): Payer: Commercial Managed Care - HMO | Admitting: Internal Medicine

## 2016-04-11 ENCOUNTER — Encounter: Payer: Self-pay | Admitting: Internal Medicine

## 2016-04-11 VITALS — BP 154/68 | HR 71 | Ht 64.0 in | Wt 144.0 lb

## 2016-04-11 DIAGNOSIS — J441 Chronic obstructive pulmonary disease with (acute) exacerbation: Secondary | ICD-10-CM

## 2016-04-11 DIAGNOSIS — J449 Chronic obstructive pulmonary disease, unspecified: Secondary | ICD-10-CM | POA: Diagnosis not present

## 2016-04-11 DIAGNOSIS — R911 Solitary pulmonary nodule: Secondary | ICD-10-CM | POA: Diagnosis not present

## 2016-04-11 DIAGNOSIS — Z23 Encounter for immunization: Secondary | ICD-10-CM | POA: Diagnosis not present

## 2016-04-11 DIAGNOSIS — R634 Abnormal weight loss: Secondary | ICD-10-CM

## 2016-04-11 DIAGNOSIS — I10 Essential (primary) hypertension: Secondary | ICD-10-CM | POA: Diagnosis not present

## 2016-04-11 DIAGNOSIS — J9611 Chronic respiratory failure with hypoxia: Secondary | ICD-10-CM | POA: Diagnosis not present

## 2016-04-11 MED ORDER — PREDNISONE 10 MG PO TABS: ORAL_TABLET | ORAL | 0 refills | Status: DC

## 2016-04-11 MED ORDER — CEPHALEXIN 500 MG PO TABS: 500.0000 mg | ORAL_TABLET | Freq: Three times a day (TID) | ORAL | 0 refills | Status: DC

## 2016-04-11 NOTE — Progress Notes (Signed)
Subjective:     Patient ID: Pamela Hampton, female   DOB: [birth year], 75 y.o.   MRN: [id number]  PCP Nyoka Cowden, MD  HPI    PCP Nyoka Cowden, MD  # 60 pack smoker quit 2004.   # Frequent UTI. Gyn cancer 1998  -s/p chemo Rx and. xrt = strong family hx of cancer  #lung imagin  - Feb 2013: No evicende of cancer on CT Chest - dec 2013 0 clear cxrf  #Obesity  - >  30# weight gain since 2010-2012  - Body mass index is 34.86 kg/(m^2).   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 Ratio 51 and DLCO 6.7/30\ Status post pulmonary rehabilitation summer 2013   #AECOPD - September 2013 treated in the office with doxycycline and prednisone on 04/14/2012 - December 2013 4 emergency room treatment with prednisone and antibiotics - July 2014 office treatment with Levaquin and prednisone - Nov 2014: office Rx - March 2015: office telpehone Rx - doxy/[pred - July 2015 - telephone RX pred alone - heat related aecopd - dec 2015 - telephone Rx with pred alone  ...................... OV 03/13/2014  Chief Complaint  Patient presents with  . Follow-up    Pt states her breathing is unchanged. Pt c/o DOE, prod cough with green and white. pt states her cough is worse in monring and when outdoors. Pt states when she becomes very dyspneic then she will experience mid sternal  CP. Pt states she received 2 units of blood in 01/2014 d/t hgb at 7.5   FU COPD with cor pulmonale  - doing well. NO admissions since nov/dec 2014 when last seen. [place] [facility] 2015: Rx over phone for aecopd. Continues qvar and duoneb without a problem. On 4L o2.   Past hx: has chronic anemia and 4 wweeks ago PCP Nyoka Cowden, MD gave 2 units prbc for hgg 7s. Feels better after that. Also  reports chronic nasal stuffiness and head cold and wants ENT referral   OV 11/06/2014  Chief Complaint  Patient presents with  . Follow-up    Pt c/o prod cough with yellow mucus, SOB with activity. Right sided chest tightness occasionally. Denies any chest congestion, n/v/d. Wants to talk about going back to symbicort.   FU COPD with cor pulmonarle  - last seen August 2015. Since that she called in once in December 2015 for COPD exacerbation and received prednisone alonefor an exacerbation. She is on duo neb and Qvar and according to her and her female companion who is with her today she is fairly compliant with this. 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 She got some injection into the eye and since then she's had partial recovery of vision. Despite this she is able to see and uses inhalers   Labs Jan 2016 hgb 9.5gm% which appears stable   OV 06/07/2015  Chief Complaint  Patient presents with  . Follow-up    Pt c/o sinus congestion, PND, prod cough with brown mucus. Pt states her SOB is at baseline. Pt denies CP/tightness.  Follow-up COPD with chronic hypoxemia and respiratory failure and cor pulmonale  - Last seen in April 2016. Since then his called in for an exacerbation. Currently she presents with a female companion. She is on chronic 4 L oxygen. She is on duo neb and Qvar. They both report compliance. She reports good stable health. For the last week she's having increased sinus drainage that is dirty brown in color. She does not know her baseline color but she does note that this is a change from her baseline. There is no fever or worsening cough or wheezing or chest tightness. She otherwise feels good in terms of energy level. This no hemoptysis. This no edema getting worse.  Chest x-ray April 2016  reported to be clear other than large hiatal hernia and COPD. Not personally visualized   OV 12/04/2015  Chief Complaint  Patient presents with  . Follow-up    Pt states her breathing has worsened since last OV. Pt c/o prod cough with yellow mucus. Pt c/o nasal congestion.    Follow-up COPD with chronic hypoxemia and respiratory failure and cor pulmonale  She presents with the family. Last seen in November 2016. Since then she's had one or 2 exacerbations. She also reports having had some rash seen by primary care physician but this is resolved. She is also dealing with chronic sinus issues and dry. I notice that she's not using Nettie pot saline wash which I recommended. She feels more deconditioned than more chronically worsening shortness of breath and in the past. She is due to get home physical therapy. She has chronic fatigue. She hopes home physical therapy will help her. There is no baseline edema. Currently is the last few days of a COPD exacerbation treatment and she is feeling better in that sense. She is open to trying Roflumilast wall preventing COPD exacerbations.    OV 03/11/2016  Chief Complaint  Patient presents with  . Follow-up    Pt states she feels her breathing has worsened since last OV. Pt c/o loss of appetite and prod cough with clear mucus. Pt denies CP/tightness and f/c/s.     Follow-up COPD advanced with chronic hypoxemic respiratory failure on oxygen high symptom burden on Scope  Last visit May 2017. Due to recurrent exacerbations be started her on roflumilast. After that according to chart review she was seen by primary care physician for iron deficiency anemia. She also in June 2017 had an admission for dehydration. In talking to her apparently her daughter thinks all the side effects are coming because of the roflumilast. In addition patient has lost significant amount of weight. Since last visit when she was 164 poundsshe has lost nearly 20 pounds and is  currently at 145 pounds. However patient categorically states that the side effects are not because of roflumilast. She denies any ongoing nausea vomiting. She does have a large hiatal herniaand she does not think the carina splaying any role in makingher lose weight. She says she was losing weight even before starting roflulimilast. In fact in April 2016 she weighed 187 pounds so she at least 17 pound weight loss in 1 year prior to starting rolflumilast. She absolutely refuses to stop roflumilast even for  first time limited trial. Feels COPD is improved.      OV 04/11/2016 Chief Complaint  Patient presents with  . Follow-up    Pt here to review CT chest. Pt c/o prod cough with yellow mucus. Pt denies changes in breathing.  Gold stage 4 copd with chronic hypoxemic resp failure   - Seen last month. Because of weight loss or do CT chest. Also ordered lab work in the key findings are as below. CT chest was personally visualized.  1. Anemia - unchanged from baseline  2. CT chest - large hiatal hernia and can explain weight loss  3. RLL nodule 4mm - suspicion for early stage lung cancer but too small to biopsy or do anything about  4. weight loss - 144# 04/11/2016 (1# loss since last visit in august) - denies any dysphagia from her hiatal hernia  5. Other issue: She presents with her daughter. Daughter states and patient also states new issue of increased cough, yellow sputum, increased chest tightness and wheeze since the rains last week. . No fever or no chills no hemoptysis. She continues to take Roflumilastlast without any problem   CAT COPD Symptom & Quality of Life Score ([place]) 0 is no burden. 5 is highest burden 04/14/2012  08/03/2012  02/14/2013  06/10/2013   Never Cough -> Cough all the time 1 4, baseline 2 4  No phlegm in chest -> Chest is full of phlegm 5 3, baseline 5 4  No chest tightness -> Chest feels very tight 1 0, /imrpoved 4 4  No dyspnea for 1 flight  stairs/hill -> Very dyspneic for 1 flight of stairs 5 5, "improved" 5 5  No limitations for ADL at home -> Very limited with ADL at home 3 5 5 5   Confident leaving home -> Not at all confident leaving home 0 2 3 4   Sleep soundly -> Do not sleep soundly because of lung condition 3 4 4 5   Lots of Energy -> No energy at all 3 5 5 5   TOTAL Score (max 40)  21 28 32 36       has a past medical history of Allergy; Anemia; Arthritis; Barrett's esophagus (07/19/2004); Blood transfusion; COPD (chronic obstructive pulmonary disease) ([place]); Depression; Emphysema of lung ([place]); GERD (gastroesophageal reflux disease); Hepatitis; Hiatal hernia; History of oxygen administration; Hyperlipidemia; Hypertension; Impaired hearing; Obesity; Osteoporosis; Pneumonia; Seizures ([place]); Sleep apnea; Stroke [facility]); Ulcer; and Vaginal cancer ([place]).   reports that she quit smoking about 13 years ago. Her smoking use included Cigarettes. She has a 60.00 pack-year smoking history. She has never used smokeless tobacco.  Past Surgical History:  Procedure Laterality Date  . ABDOMINAL HYSTERECTOMY    . abdominal laproscopy surgery    . CHOLECYSTECTOMY    . ESOPHAGOGASTRODUODENOSCOPY (EGD) WITH PROPOFOL N/A 12/21/2014   Procedure: ESOPHAGOGASTRODUODENOSCOPY (EGD) WITH PROPOFOL;  Surgeon: Inda Castle, MD;  Location: [facility];  Service: Endoscopy;  Laterality: N/A;  . HEMORRHOID SURGERY    . JOINT REPLACEMENT    . ORIF HIP FRACTURE Right   . TOTAL HIP ARTHROPLASTY Right   . 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 Influenza Split 05/07/2011, 04/14/2012, 06/06/2013  . Influenza Whole 07/08/2007, 04/25/2008, 06/12/2009  . Influenza, High Dose Seasonal PF 08/07/2014, 04/24/2015  . Pneumococcal Conjugate-13 08/07/2014  . Pneumococcal Polysaccharide-23 07/07/2011  . Tetanus 04/12/2013   . Zoster 04/19/2012    Family History  Problem Relation Age of Onset  . Heart disease Mother     MI  . Diabetes Mother   . Uterine cancer Mother   . Anemia Mother   . Hypertension Mother   .  Stomach cancer Father   . Esophageal cancer Father   . Hypertension Father   . Diabetes Sister   . Anemia Sister   . Esophageal cancer Brother   . Hypertension Brother   . Diabetes Son   . Diabetes Son   . Brain cancer Brother   . Lung cancer Sister   . Liver cancer Sister   . Colon cancer Neg Hx   . Rectal cancer Neg Hx      Current Outpatient Prescriptions:  .  albuterol (PROAIR HFA) 108 (90 Base) MCG/ACT inhaler, INHALE 2 PUFFS INTO THE LUNGS EVERY 4 HOURS AS NEEDED FOR SHORTNESS OF BREATH, Disp: 8.5 g, Rfl: 12 .  albuterol (PROVENTIL) (2.5 MG/3ML) 0.083% nebulizer solution, INHALE THE CONTENTS OF 1 VIAL VIA NEBULIZER 4 TIMES A DAY., Disp: 360 mL, Rfl: 2 .  beclomethasone (QVAR) 80 MCG/ACT inhaler, Inhale 2 puffs into the lungs 2 (two) times daily., Disp: 8.7 g, Rfl: 6 .  CRESTOR 20 MG tablet, Take 1 tablet (20 mg total) by mouth every other day., Disp: 90 tablet, Rfl: 1 .  escitalopram (LEXAPRO) 10 MG tablet, Take 1 tablet (10 mg total) by mouth daily., Disp: 30 tablet, Rfl: 5 .  Ferrous Sulfate Dried 200 (65 FE) MG TABS, 1 tablet by mouth twice daily (Patient taking differently: 1 tablet by mouth daily), Disp: 30 tablet, Rfl: 6 .  fluticasone (FLONASE) 50 MCG/ACT nasal spray, PLACE 2 SPRAYS INTO BOTH NOSTRILS DAILY., Disp: 16 g, Rfl: 5 .  furosemide (LASIX) 40 MG tablet, Take 1 tablet (40 mg total) by mouth daily as needed for fluid., Disp: , Rfl:  .  ibandronate (BONIVA) 150 MG tablet, TAKE IN THE MORNING WITH A FULL GLASS OF WATER, ON AN EMPTY STOMACH ONCE MONTHLY., Disp: 4 tablet, Rfl: 3 .  ipratropium (ATROVENT) 0.02 % nebulizer solution, INHALE THE CONTENTS OF 1 VIAL VIA NEBULIZER 4 TIMES A DAY., Disp: 300 mL, Rfl: 6 .  omeprazole (PRILOSEC) 20 MG capsule, TAKE 1 CAPSULE BY  MOUTH ONCE DAILY., Disp: 90 capsule, Rfl: 2 .  OXYGEN, Inhale 4 L/min into the lungs continuous., Disp: , Rfl:  .  polyethylene glycol powder (GLYCOLAX/MIRALAX) powder, Take 17 g by mouth 2 (two) times daily as needed. (Patient taking differently: Take 17 g by mouth daily as needed for mild constipation. ), Disp: 3350 g, Rfl: 1 .  potassium chloride SA (K-DUR,KLOR-CON) 20 MEQ tablet, TAKE 1 TABLET BY MOUTH DAILY., Disp: 30 tablet, Rfl: 5 .  roflumilast (DALIRESP) 500 MCG TABS tablet, Take 1 tablet (500 mcg total) by mouth daily., Disp: 30 tablet, Rfl: 11 .  sodium chloride (AYR) 0.65 % nasal spray, Place 1 spray into the nose as needed for congestion., Disp: , Rfl:  .  traZODone (DESYREL) 100 MG tablet, Take 1 tablet (100 mg total) by mouth at bedtime., Disp: 90 tablet, Rfl: 1    Review of Systems     Objective:   Physical Exam  Constitutional: She is oriented to person, place, and time. She appears well-developed and well-nourished. No distress.  Sitting in wheel chair  HENT:  Head: Normocephalic and atraumatic.  Right Ear: External ear normal.  Left Ear: External ear normal.  Mouth/Throat: Oropharynx is clear and moist. No oropharyngeal exudate.  o2 on  Eyes: Conjunctivae and EOM are normal. Pupils are equal, round, and reactive to light. Right eye exhibits no discharge. Left eye exhibits no discharge. No scleral icterus.  Neck: Normal range of motion. Neck supple. No JVD present.  No tracheal deviation present. No thyromegaly present.  Cardiovascular: Normal rate, regular rhythm, normal heart sounds and intact distal pulses.  Exam reveals no gallop and no friction rub.   No murmur heard. Pulmonary/Chest: Effort normal and breath sounds normal. No respiratory distress. She has no wheezes. She has no rales. She exhibits no tenderness.  barrell chest  Abdominal: Soft. Bowel sounds are normal. She exhibits no distension and no mass. There is no tenderness. There is no rebound and no  guarding.  Musculoskeletal: Normal range of motion. She exhibits no edema or tenderness.  Sitting in wheel chair  Lymphadenopathy:    She has no cervical adenopathy.  Neurological: She is alert and oriented to person, place, and time. She has normal reflexes. No cranial nerve deficit. She exhibits normal muscle tone. Coordination normal.  Skin: Skin is warm and dry. No rash noted. She is not diaphoretic. No erythema. No pallor.  Dry skin  Psychiatric: She has a normal mood and affect. Her behavior is normal. Judgment and thought content normal.  Vitals reviewed.   Vitals:   04/11/16 1337  BP: (!) 154/68  Pulse: 71  SpO2: 98%  Weight: 144 lb (65.3 kg)  Height: 5\' 4"  (1.626 m)        Assessment:        ICD-9-CM ICD-10-CM   1. Chronic respiratory failure with hypoxia (HCC) 518.83 J96.11    799.02    2. COPD exacerbation ([place]) 491.21 J44.1   3. Loss of weight 783.21 R63.4   4. Nodule of right lung 793.11 R91.1       Plan:     Chronic respiratory failure with hypoxia (HCC) COPD exacerbation (HCC) - cephalexin 500mg  three times daily x 5 days  - Please take prednisone 40 mg x1 day, then 30 mg x1 day, then 20 mg x1 day, then 10 mg x1 day, and then 5 mg x1 day and stop - continue your other copd meds as before - high dose flu shot 04/11/2016   Loss of weight - seems to be stabilizing but could be related to large hiatal hernia  Nodule of right lung 61mm RLL Aug 2017  - do repeat ct chest in 65months  Followup 6 months or sooner if needed

## 2016-04-11 NOTE — Patient Instructions (Signed)
Chronic respiratory failure with hypoxia (HCC) COPD exacerbation (HCC) - cephalexin 500mg  three times daily x 5 days  - Please take prednisone 40 mg x1 day, then 30 mg x1 day, then 20 mg x1 day, then 10 mg x1 day, and then 5 mg x1 day and stop - continue your other copd meds as before - high dose flu shot 04/11/2016   Loss of weight - seems to be stabilizing but could be related to large hiatal hernia  Nodule of right lung 41mm RLL Aug 2017  - do repeat ct chest in 85months  Followup 6 months or sooner if needed

## 2016-04-14 DIAGNOSIS — I10 Essential (primary) hypertension: Secondary | ICD-10-CM | POA: Diagnosis not present

## 2016-04-15 DIAGNOSIS — I10 Essential (primary) hypertension: Secondary | ICD-10-CM | POA: Diagnosis not present

## 2016-04-16 ENCOUNTER — Ambulatory Visit (INDEPENDENT_AMBULATORY_CARE_PROVIDER_SITE_OTHER): Payer: Commercial Managed Care - HMO | Admitting: Internal Medicine

## 2016-04-16 ENCOUNTER — Encounter: Payer: Self-pay | Admitting: Internal Medicine

## 2016-04-16 VITALS — BP 164/80 | HR 77 | Temp 98.0°F | Resp 20

## 2016-04-16 DIAGNOSIS — I10 Essential (primary) hypertension: Secondary | ICD-10-CM | POA: Diagnosis not present

## 2016-04-16 DIAGNOSIS — Q2730 Arteriovenous malformation, site unspecified: Secondary | ICD-10-CM | POA: Diagnosis not present

## 2016-04-16 DIAGNOSIS — Z862 Personal history of diseases of the blood and blood-forming organs and certain disorders involving the immune mechanism: Secondary | ICD-10-CM

## 2016-04-16 DIAGNOSIS — J449 Chronic obstructive pulmonary disease, unspecified: Secondary | ICD-10-CM | POA: Diagnosis not present

## 2016-04-16 DIAGNOSIS — J9611 Chronic respiratory failure with hypoxia: Secondary | ICD-10-CM | POA: Diagnosis not present

## 2016-04-16 LAB — CBC WITH DIFFERENTIAL/PLATELET
BASOS PCT: 0.2 % (ref 0.0–3.0)
Basophils Absolute: 0 10*3/uL (ref 0.0–0.1)
EOS PCT: 0.1 % (ref 0.0–5.0)
Eosinophils Absolute: 0 10*3/uL (ref 0.0–0.7)
HCT: 34.3 % — ABNORMAL LOW (ref 36.0–46.0)
Hemoglobin: 11.1 g/dL — ABNORMAL LOW (ref 12.0–15.0)
LYMPHS ABS: 1.9 10*3/uL (ref 0.7–4.0)
Lymphocytes Relative: 15.7 % (ref 12.0–46.0)
MCHC: 32.4 g/dL (ref 30.0–36.0)
MCV: 85.4 fl (ref 78.0–100.0)
MONO ABS: 0.6 10*3/uL (ref 0.1–1.0)
MONOS PCT: 4.9 % (ref 3.0–12.0)
Neutro Abs: 9.4 10*3/uL — ABNORMAL HIGH (ref 1.4–7.7)
Neutrophils Relative %: 79.1 % — ABNORMAL HIGH (ref 43.0–77.0)
Platelets: 315 10*3/uL (ref 150.0–400.0)
RBC: 4.01 Mil/uL (ref 3.87–5.11)
RDW: 17.4 % — AB (ref 11.5–15.5)
WBC: 11.9 10*3/uL — ABNORMAL HIGH (ref 4.0–10.5)

## 2016-04-16 NOTE — Progress Notes (Signed)
Subjective:    Patient ID: Pamela Hampton, female    DOB: November 14, 1940, 75 y.o.   MRN: [id number]  HPI  75 year old patient who has a history of advanced COPD, oxygen dependent with chronic respiratory failure.  She has been seen by pulmonary medicine recently and has received a high-dose flu vaccine. She has a history of chronic GI bleeding secondary to a AVMs.  Last CBC was stable. She generally feels quite well today.  Past Medical History:  Diagnosis Date  . Allergy    Rhinitis  . Anemia   . Arthritis   . Barrett's esophagus 07/19/2004  . Blood transfusion   . COPD (chronic obstructive pulmonary disease) (HCC)    wears 3 liters of o2 continious  . Depression   . Emphysema of lung ([place])   . GERD (gastroesophageal reflux disease)    Barrett's esophagus  . Hepatitis   . Hiatal hernia   . History of oxygen administration    oxygen concentrator @ 4 l/m nasally 24/ 7  . Hyperlipidemia   . Hypertension   . Impaired hearing   . Obesity   . Osteoporosis   . Pneumonia   . Seizures ([place])   . Sleep apnea   . Stroke ([place])   . Ulcer   . Vaginal cancer ([place])    radiation implant '97. radiation. chemo.     Social History   Social History  . Marital status: Divorced    Spouse name: N/A  . Number of children: 4  . Years of education: N/A   Occupational History  . disaled Retired   Social History Main Topics  . Smoking status: Former Smoker    Packs/day: 2.00    Years: 30.00    Types: Cigarettes    Quit date: 07/28/2002  . Smokeless tobacco: Never Used  . Alcohol use No  . Drug use: No  . Sexual activity: Not Currently   Other Topics Concern  . Not on file   Social History Narrative  . No narrative on file    Past Surgical History:  Procedure Laterality Date  . ABDOMINAL HYSTERECTOMY    . abdominal laproscopy surgery    . CHOLECYSTECTOMY    . ESOPHAGOGASTRODUODENOSCOPY (EGD) WITH PROPOFOL N/A 12/21/2014   Procedure: ESOPHAGOGASTRODUODENOSCOPY (EGD) WITH  PROPOFOL;  Surgeon: Inda Castle, MD;  Location: [facility];  Service: Endoscopy;  Laterality: N/A;  . HEMORRHOID SURGERY    . JOINT REPLACEMENT    . ORIF HIP FRACTURE Right   . TOTAL HIP ARTHROPLASTY Right   . 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 Heart disease Mother     MI  . Diabetes Mother   . Uterine cancer Mother   . Anemia Mother   . Hypertension Mother   . Stomach cancer Father   . Esophageal cancer Father   . Hypertension Father   . Diabetes Sister   . Anemia Sister   . Esophageal cancer Brother   . Hypertension Brother   . Diabetes Son   . Diabetes Son   . Brain cancer Brother   . Lung cancer Sister   . Liver cancer Sister   . Colon cancer Neg Hx   . Rectal cancer Neg Hx     No Known Allergies  Current Outpatient Prescriptions on File Prior to Visit  Medication Sig Dispense Refill  . albuterol (PROAIR HFA) 108 (  90 Base) MCG/ACT inhaler INHALE 2 PUFFS INTO THE LUNGS EVERY 4 HOURS AS NEEDED FOR SHORTNESS OF BREATH 8.5 g 12  . albuterol (PROVENTIL) (2.5 MG/3ML) 0.083% nebulizer solution INHALE THE CONTENTS OF 1 VIAL VIA NEBULIZER 4 TIMES A DAY. 360 mL 2  . beclomethasone (QVAR) 80 MCG/ACT inhaler Inhale 2 puffs into the lungs 2 (two) times daily. 8.7 g 6  . Cephalexin 500 MG tablet Take 1 tablet (500 mg total) by mouth 3 (three) times daily. 15 tablet 0  . CRESTOR 20 MG tablet Take 1 tablet (20 mg total) by mouth every other day. 90 tablet 1  . escitalopram (LEXAPRO) 10 MG tablet Take 1 tablet (10 mg total) by mouth daily. 30 tablet 5  . Ferrous Sulfate Dried 200 (65 FE) MG TABS 1 tablet by mouth twice daily (Patient taking differently: 1 tablet by mouth daily) 30 tablet 6  . fluticasone (FLONASE) 50 MCG/ACT nasal spray PLACE 2 SPRAYS INTO BOTH NOSTRILS DAILY. 16 g 5  . furosemide (LASIX) 40 MG tablet Take 1 tablet (40 mg total) by mouth daily as needed  for fluid.    Marland Kitchen ibandronate (BONIVA) 150 MG tablet TAKE IN THE MORNING WITH A FULL GLASS OF WATER, ON AN EMPTY STOMACH ONCE MONTHLY. 4 tablet 3  . ipratropium (ATROVENT) 0.02 % nebulizer solution INHALE THE CONTENTS OF 1 VIAL VIA NEBULIZER 4 TIMES A DAY. 300 mL 6  . omeprazole (PRILOSEC) 20 MG capsule TAKE 1 CAPSULE BY MOUTH ONCE DAILY. 90 capsule 2  . OXYGEN Inhale 4 L/min into the lungs continuous.    . polyethylene glycol powder (GLYCOLAX/MIRALAX) powder Take 17 g by mouth 2 (two) times daily as needed. (Patient taking differently: Take 17 g by mouth daily as needed for mild constipation. ) 3350 g 1  . potassium chloride SA (K-DUR,KLOR-CON) 20 MEQ tablet TAKE 1 TABLET BY MOUTH DAILY. 30 tablet 5  . predniSONE (DELTASONE) 10 MG tablet 4 tabs X 1 day, 3 tabs X 1 day, 2 tabs X 1 day, 1 tab X 1 day 0.5 tab X 1 day. 11 tablet 0  . roflumilast (DALIRESP) 500 MCG TABS tablet Take 1 tablet (500 mcg total) by mouth daily. 30 tablet 11  . sodium chloride (AYR) 0.65 % nasal spray Place 1 spray into the nose as needed for congestion.    . traZODone (DESYREL) 100 MG tablet Take 1 tablet (100 mg total) by mouth at bedtime. 90 tablet 1   No current facility-administered medications on file prior to visit.     BP (!) 164/80 (BP Location: Left Arm, Patient Position: Sitting, Cuff Size: Normal)   Pulse 77   Temp 98 F (36.7 C) (Oral)   Resp 20   SpO2 98% Comment: Oxygen on at 4 L/min via nasal cannula    Review of Systems  Constitutional: Negative.   HENT: Negative for congestion, dental problem, hearing loss, rhinorrhea, sinus pressure, sore throat and tinnitus.   Eyes: Negative for pain, discharge and visual disturbance.  Respiratory: Positive for shortness of breath. Negative for cough.   Cardiovascular: Negative for chest pain, palpitations and leg swelling.  Gastrointestinal: Negative for abdominal distention, abdominal pain, blood in stool, constipation, diarrhea, nausea and vomiting.    Genitourinary: Negative for difficulty urinating, dysuria, flank pain, frequency, hematuria, pelvic pain, urgency, vaginal bleeding, vaginal discharge and vaginal pain.  Musculoskeletal: Positive for gait problem. Negative for arthralgias and joint swelling.  Skin: Negative for rash.  Neurological: Negative for dizziness, syncope, speech difficulty,  weakness, numbness and headaches.  Hematological: Negative for adenopathy.  Psychiatric/Behavioral: Negative for agitation, behavioral problems and dysphoric mood. The patient is not nervous/anxious.        Objective:   Physical Exam  Constitutional: She is oriented to person, place, and time. She appears well-developed and well-nourished.  Wheelchair bound Repeat blood pressure 142/80  HENT:  Head: Normocephalic.  Right Ear: External ear normal.  Left Ear: External ear normal.  Mouth/Throat: Oropharynx is clear and moist.  Nasal cannula O2 in place  Eyes: Conjunctivae and EOM are normal. Pupils are equal, round, and reactive to light.  Neck: Normal range of motion. Neck supple. No thyromegaly present.  Cardiovascular: Normal rate, regular rhythm, normal heart sounds and intact distal pulses.   Pulmonary/Chest: Effort normal and breath sounds normal.  Abdominal: Soft. Bowel sounds are normal. She exhibits no mass. There is no tenderness.  Musculoskeletal: Normal range of motion. She exhibits no edema.  Lymphadenopathy:    She has no cervical adenopathy.  Neurological: She is alert and oriented to person, place, and time.  Skin: Skin is warm and dry. No rash noted.  Psychiatric: She has a normal mood and affect. Her behavior is normal.          Assessment & Plan:   Advanced COPD Chronic respiratory failure History GI bleeding secondary to AVMs.  Check CBC History of pulmonary nodule.  Follow-up CT planned for 6 months  Return here 3 months or as needed  [employer]

## 2016-04-16 NOTE — Patient Instructions (Signed)
Return in 3 months for follow-up  CBC every 3 months

## 2016-04-16 NOTE — Progress Notes (Signed)
Pre visit review using our clinic review tool, if applicable. No additional management support is needed unless otherwise documented below in the visit note.  Pt presented to the office with Oxygen on at 4 L/min via nasal cannula.

## 2016-04-17 ENCOUNTER — Other Ambulatory Visit: Payer: Self-pay | Admitting: Internal Medicine

## 2016-04-17 DIAGNOSIS — I10 Essential (primary) hypertension: Secondary | ICD-10-CM | POA: Diagnosis not present

## 2016-04-18 DIAGNOSIS — I10 Essential (primary) hypertension: Secondary | ICD-10-CM | POA: Diagnosis not present

## 2016-04-21 DIAGNOSIS — J449 Chronic obstructive pulmonary disease, unspecified: Secondary | ICD-10-CM | POA: Diagnosis not present

## 2016-04-29 ENCOUNTER — Telehealth: Payer: Self-pay | Admitting: Internal Medicine

## 2016-04-29 DIAGNOSIS — J9611 Chronic respiratory failure with hypoxia: Secondary | ICD-10-CM

## 2016-04-29 DIAGNOSIS — J449 Chronic obstructive pulmonary disease, unspecified: Secondary | ICD-10-CM

## 2016-04-29 NOTE — Telephone Encounter (Signed)
Left message on voicemail to call office. Order for Bedside commode put in EPIC.

## 2016-04-29 NOTE — Telephone Encounter (Signed)
Pt would like to see if you could order her a bed side toilet.

## 2016-05-01 NOTE — Telephone Encounter (Signed)
Spoke to pt, told her I put order in for Bedside Commode and someone should be contacting her about it. Pt verbalized understanding.

## 2016-05-11 DIAGNOSIS — J449 Chronic obstructive pulmonary disease, unspecified: Secondary | ICD-10-CM | POA: Diagnosis not present

## 2016-06-05 ENCOUNTER — Telehealth: Payer: Self-pay | Admitting: Internal Medicine

## 2016-06-05 MED ORDER — AZITHROMYCIN 250 MG PO TABS
ORAL_TABLET | ORAL | 0 refills | Status: AC
Start: 2016-06-05 — End: 2016-06-10

## 2016-06-05 NOTE — Telephone Encounter (Signed)
Offer Zpak  # 6, 2 today then one daily   Suggest Mucinex-DM otc

## 2016-06-05 NOTE — Telephone Encounter (Signed)
Called and spoke to pt. Pt c/o increase in prod cough with yellow to light green mucus and slight increase in SOB x 2 days. Pt denies CP/tightness, f/c/s, swelling, head congestion. Pt is requesting recs. Will send to [facility] in MR's absence.   Dr. Annamaria Boots please advise. Thanks.   No Known Allergies   Current Outpatient Prescriptions on File Prior to Visit  Medication Sig Dispense Refill  . albuterol (PROAIR HFA) 108 (90 Base) MCG/ACT inhaler INHALE 2 PUFFS INTO THE LUNGS EVERY 4 HOURS AS NEEDED FOR SHORTNESS OF BREATH 8.5 g 12  . albuterol (PROVENTIL) (2.5 MG/3ML) 0.083% nebulizer solution INHALE THE CONTENTS OF 1 VIAL VIA NEBULIZER 4 TIMES A DAY. 360 mL 2  . beclomethasone (QVAR) 80 MCG/ACT inhaler Inhale 2 puffs into the lungs 2 (two) times daily. 8.7 g 6  . Cephalexin 500 MG tablet Take 1 tablet (500 mg total) by mouth 3 (three) times daily. 15 tablet 0  . CRESTOR 20 MG tablet Take 1 tablet (20 mg total) by mouth every other day. 90 tablet 1  . escitalopram (LEXAPRO) 10 MG tablet TAKE 1 TABLET BY MOUTH DAILY. 30 tablet 5  . Ferrous Sulfate Dried 200 (65 FE) MG TABS 1 tablet by mouth twice daily (Patient taking differently: 1 tablet by mouth daily) 30 tablet 6  . fluticasone (FLONASE) 50 MCG/ACT nasal spray PLACE 2 SPRAYS INTO BOTH NOSTRILS DAILY. 16 g 5  . furosemide (LASIX) 40 MG tablet Take 1 tablet (40 mg total) by mouth daily as needed for fluid.    Marland Kitchen ibandronate (BONIVA) 150 MG tablet TAKE IN THE MORNING WITH A FULL GLASS OF WATER, ON AN EMPTY STOMACH ONCE MONTHLY. 4 tablet 3  . ipratropium (ATROVENT) 0.02 % nebulizer solution INHALE THE CONTENTS OF 1 VIAL VIA NEBULIZER 4 TIMES A DAY. 300 mL 6  . omeprazole (PRILOSEC) 20 MG capsule TAKE 1 CAPSULE BY MOUTH ONCE DAILY. 90 capsule 2  . OXYGEN Inhale 4 L/min into the lungs continuous.    . polyethylene glycol powder (GLYCOLAX/MIRALAX) powder Take 17 g by mouth 2 (two) times daily as needed. (Patient taking differently: Take 17 g by mouth daily as  needed for mild constipation. ) 3350 g 1  . potassium chloride SA (K-DUR,KLOR-CON) 20 MEQ tablet TAKE 1 TABLET BY MOUTH DAILY. 30 tablet 5  . roflumilast (DALIRESP) 500 MCG TABS tablet Take 1 tablet (500 mcg total) by mouth daily. 30 tablet 11  . sodium chloride (AYR) 0.65 % nasal spray Place 1 spray into the nose as needed for congestion.    . traZODone (DESYREL) 100 MG tablet Take 1 tablet (100 mg total) by mouth at bedtime. 90 tablet 1   No current facility-administered medications on file prior to visit.

## 2016-06-05 NOTE — Telephone Encounter (Signed)
Spoke with pt. She is aware of CY's recommendations. Rx has been sent in. Nothing further was needed. 

## 2016-06-11 DIAGNOSIS — J449 Chronic obstructive pulmonary disease, unspecified: Secondary | ICD-10-CM | POA: Diagnosis not present

## 2016-06-13 ENCOUNTER — Other Ambulatory Visit: Payer: Self-pay | Admitting: Internal Medicine

## 2016-06-14 ENCOUNTER — Other Ambulatory Visit: Payer: Self-pay | Admitting: Internal Medicine

## 2016-07-08 DIAGNOSIS — J449 Chronic obstructive pulmonary disease, unspecified: Secondary | ICD-10-CM | POA: Diagnosis not present

## 2016-07-11 DIAGNOSIS — J449 Chronic obstructive pulmonary disease, unspecified: Secondary | ICD-10-CM | POA: Diagnosis not present

## 2016-07-16 ENCOUNTER — Encounter: Payer: Self-pay | Admitting: Internal Medicine

## 2016-07-16 ENCOUNTER — Ambulatory Visit (INDEPENDENT_AMBULATORY_CARE_PROVIDER_SITE_OTHER): Payer: Commercial Managed Care - HMO | Admitting: Internal Medicine

## 2016-07-16 VITALS — BP 122/66 | HR 66 | Temp 98.0°F | Resp 20 | Ht 64.0 in | Wt 139.4 lb

## 2016-07-16 DIAGNOSIS — Z8774 Personal history of (corrected) congenital malformations of heart and circulatory system: Secondary | ICD-10-CM

## 2016-07-16 DIAGNOSIS — D500 Iron deficiency anemia secondary to blood loss (chronic): Secondary | ICD-10-CM

## 2016-07-16 DIAGNOSIS — I10 Essential (primary) hypertension: Secondary | ICD-10-CM | POA: Diagnosis not present

## 2016-07-16 DIAGNOSIS — J9611 Chronic respiratory failure with hypoxia: Secondary | ICD-10-CM

## 2016-07-16 LAB — CBC WITH DIFFERENTIAL/PLATELET
BASOS ABS: 0.1 10*3/uL (ref 0.0–0.1)
Basophils Relative: 0.8 % (ref 0.0–3.0)
EOS PCT: 2.3 % (ref 0.0–5.0)
Eosinophils Absolute: 0.2 10*3/uL (ref 0.0–0.7)
HCT: 32 % — ABNORMAL LOW (ref 36.0–46.0)
Hemoglobin: 10.2 g/dL — ABNORMAL LOW (ref 12.0–15.0)
LYMPHS ABS: 3.2 10*3/uL (ref 0.7–4.0)
Lymphocytes Relative: 32.2 % (ref 12.0–46.0)
MCHC: 31.9 g/dL (ref 30.0–36.0)
MCV: 85.8 fl (ref 78.0–100.0)
MONOS PCT: 8.4 % (ref 3.0–12.0)
Monocytes Absolute: 0.8 10*3/uL (ref 0.1–1.0)
NEUTROS ABS: 5.6 10*3/uL (ref 1.4–7.7)
NEUTROS PCT: 56.3 % (ref 43.0–77.0)
PLATELETS: 285 10*3/uL (ref 150.0–400.0)
RBC: 3.73 Mil/uL — ABNORMAL LOW (ref 3.87–5.11)
RDW: 16.6 % — AB (ref 11.5–15.5)
WBC: 10 10*3/uL (ref 4.0–10.5)

## 2016-07-16 NOTE — Progress Notes (Signed)
Subjective:    Patient ID: Pamela Hampton, female    DOB: 09-18-1940, 75 y.o.   MRN: [id number]  HPI  75 year old patient who is seen today for follow-up.  She has a history of advanced COPD with chronic hypoxic respiratory failure.  She has essential hypertension.  She has a history of colonic AVMs and chronic anemia.  Her pulmonary status has been stable. Complaints today include posterior neck pain.  She also complains of some sinus congestion and dry.  Nasal mucosal membranes She complains of decreased hearing especially from the left ear  Past Medical History:  Diagnosis Date  . Allergy    Rhinitis  . Anemia   . Arthritis   . Barrett's esophagus 07/19/2004  . Blood transfusion   . COPD (chronic obstructive pulmonary disease) (HCC)    wears 3 liters of o2 continious  . Depression   . Emphysema of lung ([place])   . GERD (gastroesophageal reflux disease)    Barrett's esophagus  . Hepatitis   . Hiatal hernia   . History of oxygen administration    oxygen concentrator @ 4 l/m nasally 24/ 7  . Hyperlipidemia   . Hypertension   . Impaired hearing   . Obesity   . Osteoporosis   . Pneumonia   . Seizures ([place])   . Sleep apnea   . Stroke ([place])   . Ulcer ([place])   . Vaginal cancer ([place])    radiation implant '97. radiation. chemo.     Social History   Social History  . Marital status: Divorced    Spouse name: N/A  . Number of children: 4  . Years of education: N/A   Occupational History  . disaled Retired   Social History Main Topics  . Smoking status: Former Smoker    Packs/day: 2.00    Years: 30.00    Types: Cigarettes    Quit date: 07/28/2002  . Smokeless tobacco: Never Used  . Alcohol use No  . Drug use: No  . Sexual activity: Not Currently   Other Topics Concern  . Not on file   Social History Narrative  . No narrative on file    Past Surgical History:  Procedure Laterality Date  . ABDOMINAL HYSTERECTOMY    . abdominal laproscopy surgery    .  CHOLECYSTECTOMY    . ESOPHAGOGASTRODUODENOSCOPY (EGD) WITH PROPOFOL N/A 12/21/2014   Procedure: ESOPHAGOGASTRODUODENOSCOPY (EGD) WITH PROPOFOL;  Surgeon: Inda Castle, MD;  Location: [facility];  Service: Endoscopy;  Laterality: N/A;  . HEMORRHOID SURGERY    . JOINT REPLACEMENT    . ORIF HIP FRACTURE Right   . TOTAL HIP ARTHROPLASTY Right   . 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 Heart disease Mother     MI  . Diabetes Mother   . Uterine cancer Mother   . Anemia Mother   . Hypertension Mother   . Stomach cancer Father   . Esophageal cancer Father   . Hypertension Father   . Diabetes Sister   . Anemia Sister   . Esophageal cancer Brother   . Hypertension Brother   . Diabetes Son   . Diabetes Son   . Brain cancer Brother   . Lung cancer Sister   . Liver cancer Sister   . Colon cancer Neg Hx   . Rectal cancer Neg Hx     No Known  Allergies  Current Outpatient Prescriptions on File Prior to Visit  Medication Sig Dispense Refill  . albuterol (PROAIR HFA) 108 (90 Base) MCG/ACT inhaler INHALE 2 PUFFS INTO THE LUNGS EVERY 4 HOURS AS NEEDED FOR SHORTNESS OF BREATH 8.5 g 12  . albuterol (PROVENTIL) (2.5 MG/3ML) 0.083% nebulizer solution INHALE THE CONTENTS OF 1 VIAL VIA NEBULIZER 4 TIMES A DAY. 360 mL 2  . beclomethasone (QVAR) 80 MCG/ACT inhaler Inhale 2 puffs into the lungs 2 (two) times daily. 8.7 g 6  . CRESTOR 20 MG tablet Take 1 tablet (20 mg total) by mouth every other day. 90 tablet 1  . escitalopram (LEXAPRO) 10 MG tablet TAKE 1 TABLET BY MOUTH DAILY. 30 tablet 5  . Ferrous Sulfate Dried 200 (65 FE) MG TABS 1 tablet by mouth twice daily (Patient taking differently: 1 tablet by mouth daily) 30 tablet 6  . fluticasone (FLONASE) 50 MCG/ACT nasal spray PLACE 2 SPRAYS INTO BOTH NOSTRILS DAILY. 16 g 5  . furosemide (LASIX) 40 MG tablet Take 1 tablet (40 mg total) by  mouth daily as needed for fluid.    Marland Kitchen ibandronate (BONIVA) 150 MG tablet TAKE IN THE MORNING WITH A FULL GLASS OF WATER, ON AN EMPTY STOMACH ONCE MONTHLY. 4 tablet 3  . ipratropium (ATROVENT) 0.02 % nebulizer solution INHALE THE CONTENTS OF 1 VIAL VIA NEBULIZER 4 TIMES A DAY. 300 mL 6  . omeprazole (PRILOSEC) 20 MG capsule TAKE 1 CAPSULE BY MOUTH ONCE DAILY. 90 capsule 2  . OXYGEN Inhale 4 L/min into the lungs continuous.    . polyethylene glycol powder (GLYCOLAX/MIRALAX) powder Take 17 g by mouth 2 (two) times daily as needed. (Patient taking differently: Take 17 g by mouth daily as needed for mild constipation. ) 3350 g 1  . potassium chloride SA (K-DUR,KLOR-CON) 20 MEQ tablet TAKE 1 TABLET BY MOUTH DAILY. 30 tablet 6  . roflumilast (DALIRESP) 500 MCG TABS tablet Take 1 tablet (500 mcg total) by mouth daily. 30 tablet 11  . sodium chloride (AYR) 0.65 % nasal spray Place 1 spray into the nose as needed for congestion.    . traZODone (DESYREL) 100 MG tablet TAKE 1 TABLET BY MOUTH AT BEDTIME. 90 tablet 1   No current facility-administered medications on file prior to visit.     BP 122/66 (BP Location: Left Arm, Patient Position: Sitting, Cuff Size: Normal)   Pulse 66   Temp 98 F (36.7 C) (Oral)   Resp 20   Ht 5\' 4"  (1.626 m)   Wt 139 lb 6.1 oz (63.2 kg)   SpO2 98% Comment: Oxygen on at 4 L/min via nasal cannula  BMI 23.92 kg/m      Review of Systems  Constitutional: Negative.   HENT: Positive for congestion. Negative for dental problem, hearing loss, rhinorrhea, sinus pressure, sore throat and tinnitus.   Eyes: Negative for pain, discharge and visual disturbance.  Respiratory: Positive for shortness of breath. Negative for cough.   Cardiovascular: Negative for chest pain, palpitations and leg swelling.  Gastrointestinal: Negative for abdominal distention, abdominal pain, blood in stool, constipation, diarrhea, nausea and vomiting.  Genitourinary: Negative for difficulty urinating,  dysuria, flank pain, frequency, hematuria, pelvic pain, urgency, vaginal bleeding, vaginal discharge and vaginal pain.  Musculoskeletal: Positive for neck pain and neck stiffness. Negative for arthralgias, gait problem and joint swelling.  Skin: Negative for rash.  Neurological: Negative for dizziness, syncope, speech difficulty, weakness, numbness and headaches.  Hematological: Negative for adenopathy.  Psychiatric/Behavioral: Negative for agitation,  behavioral problems and dysphoric mood. The patient is not nervous/anxious.        Objective:   Physical Exam  Constitutional: She is oriented to person, place, and time. She appears well-developed and well-nourished. No distress.  No distress but appears chronically ill Nasal cannula oxygen in place  HENT:  Head: Normocephalic.  Right Ear: External ear normal.  Left Ear: External ear normal.  Mouth/Throat: Oropharynx is clear and moist.  Eyes: Conjunctivae and EOM are normal. Pupils are equal, round, and reactive to light.  Neck: Neck supple. No thyromegaly present.  Tenderness in the posterior neck musculature  Cardiovascular: Normal rate, regular rhythm, normal heart sounds and intact distal pulses.   Pulmonary/Chest: Effort normal. She has rales.  Abdominal: Soft. Bowel sounds are normal. She exhibits no mass. There is no tenderness.  Musculoskeletal: Normal range of motion.  Lymphadenopathy:    She has no cervical adenopathy.  Neurological: She is alert and oriented to person, place, and time.  Skin: Skin is warm and dry. No rash noted.  Psychiatric: She has a normal mood and affect. Her behavior is normal.          Assessment & Plan:   Chronic respiratory failure Posterior neck pain.  Appears to be musculoligamentous.  We'll try heat therapy, gentle massage and gentle stretching Dyslipidemia.  Continue statin therapy History colonic AVMs and chronic iron deficiency anemia.  Check CBC  Follow-up 3  months   [id number],PETER Pilar Plate

## 2016-07-16 NOTE — Patient Instructions (Signed)
Hydrate and Humidify  Drink enough water to keep your pee (urine) clear or pale yellow.  Use a cool mist humidifier to keep the humidity level in your home above 50%.  Breathe in steam for 10-15 minutes, 3-4 times a day or as told by your doctor. You can do this in the bathroom while a hot shower is running.  Try not to spend time in cool or dry air. Rest  Rest as much as possible.  Sleep with your head raised (elevated).  Make sure to get enough sleep each night.

## 2016-07-16 NOTE — Progress Notes (Signed)
Pre visit review using our clinic review tool, if applicable. No additional management support is needed unless otherwise documented below in the visit note.  Pt presented to the office with oxygen on at 4 L/min via nasal cannula.

## 2016-07-30 ENCOUNTER — Other Ambulatory Visit: Payer: Self-pay | Admitting: Internal Medicine

## 2016-08-11 DIAGNOSIS — J449 Chronic obstructive pulmonary disease, unspecified: Secondary | ICD-10-CM | POA: Diagnosis not present

## 2016-08-13 ENCOUNTER — Other Ambulatory Visit: Payer: Self-pay | Admitting: Internal Medicine

## 2016-08-18 ENCOUNTER — Telehealth: Payer: Self-pay | Admitting: Internal Medicine

## 2016-08-18 DIAGNOSIS — R300 Dysuria: Secondary | ICD-10-CM

## 2016-08-18 NOTE — Telephone Encounter (Signed)
Okay for lab work

## 2016-08-18 NOTE — Telephone Encounter (Signed)
See message below, please advise.

## 2016-08-18 NOTE — Telephone Encounter (Signed)
Pt thinks she may have a UTI.  May I have a order for lab work?

## 2016-08-19 ENCOUNTER — Other Ambulatory Visit (INDEPENDENT_AMBULATORY_CARE_PROVIDER_SITE_OTHER): Payer: Commercial Managed Care - HMO

## 2016-08-19 DIAGNOSIS — R300 Dysuria: Secondary | ICD-10-CM

## 2016-08-19 LAB — POC URINALSYSI DIPSTICK (AUTOMATED)
Bilirubin, UA: NEGATIVE
GLUCOSE UA: NEGATIVE
NITRITE UA: NEGATIVE
Spec Grav, UA: >=1.03
UROBILINOGEN UA: 0.2
pH, UA: 5.5

## 2016-08-19 NOTE — Telephone Encounter (Signed)
Lab order was put in and pt made aware.

## 2016-08-21 ENCOUNTER — Telehealth: Payer: Self-pay | Admitting: Internal Medicine

## 2016-08-21 NOTE — Telephone Encounter (Signed)
Pt would like ua results  °

## 2016-08-22 MED ORDER — SULFAMETHOXAZOLE-TRIMETHOPRIM 800-160 MG PO TABS: 1.0000 | ORAL_TABLET | Freq: Two times a day (BID) | ORAL | 0 refills | Status: DC

## 2016-08-22 NOTE — Telephone Encounter (Signed)
Medication called in to pharmacy, Pt made aware.

## 2016-08-22 NOTE — Telephone Encounter (Signed)
Yes, pt is having urinary hesitancy, burning with urination and lower ABD pain.

## 2016-08-22 NOTE — Telephone Encounter (Signed)
Generic Septra DS No. 14 one twice a day 

## 2016-08-22 NOTE — Telephone Encounter (Signed)
Urinalysis revealed only trace WBCs.  Is patient having symptoms of a UTI??

## 2016-08-22 NOTE — Telephone Encounter (Signed)
See message below, please advise.

## 2016-08-28 DIAGNOSIS — J449 Chronic obstructive pulmonary disease, unspecified: Secondary | ICD-10-CM | POA: Diagnosis not present

## 2016-09-05 DIAGNOSIS — J449 Chronic obstructive pulmonary disease, unspecified: Secondary | ICD-10-CM | POA: Diagnosis not present

## 2016-09-05 DIAGNOSIS — H5213 Myopia, bilateral: Secondary | ICD-10-CM | POA: Diagnosis not present

## 2016-09-05 DIAGNOSIS — H524 Presbyopia: Secondary | ICD-10-CM | POA: Diagnosis not present

## 2016-09-05 DIAGNOSIS — H5211 Myopia, right eye: Secondary | ICD-10-CM | POA: Diagnosis not present

## 2016-09-10 ENCOUNTER — Other Ambulatory Visit: Payer: Self-pay | Admitting: Internal Medicine

## 2016-09-11 DIAGNOSIS — J449 Chronic obstructive pulmonary disease, unspecified: Secondary | ICD-10-CM | POA: Diagnosis not present

## 2016-09-12 ENCOUNTER — Telehealth: Payer: Self-pay

## 2016-09-12 NOTE — Telephone Encounter (Signed)
LMTCB for pt's daughter 

## 2016-09-12 NOTE — Telephone Encounter (Signed)
Spoke with pt's daughter, Abigail Butts. She states that pt had one episode of light brown blood in her incontinence pad 2 days ago. They are not sure if it is coming from her urinary tract or not. There has not been any more. Pt states that she does not feel she has improved on abx for UTI and still has abdominal pain.   Dr. Raliegh Ip - Please advise. Thanks!    Patient Name: Pamela Hampton Gender: Female DOB: 05-05-1941 Age: 76 Y 65 M 24 D Return Phone Number: [phone] (Primary), [phone] (Secondary), [phone] (Alternate) City/State/Zip: [place] Client [facility] - Client Client Site [facility] - Day Physician Simonne Martinet - MD Who Is Calling Patient / Member / Family / Caregiver Call Type Triage / Clinical Caller Name Abigail Butts Relationship To Patient Daughter Return Phone Number [phone] (Alternate) Chief Complaint Urine, Blood In Reason for Call Symptomatic / Request for [place] states her Mother was seen two weeks ago, UTI. She was put on antibiotics. Blood on her pad. Not coming from the rectum.

## 2016-09-12 NOTE — Telephone Encounter (Signed)
Spoke with pt's daughter and advised. She scheduled appt for 09/15/16 with Dr Raliegh Ip. Advised daughter if symptoms worsen or pt has more blood present to call on-call or take to [facility]. She voiced understanding. Nothing further needed at this time.

## 2016-09-12 NOTE — Telephone Encounter (Signed)
Suggest follow-up office visit.  If unimproved

## 2016-09-15 ENCOUNTER — Ambulatory Visit (INDEPENDENT_AMBULATORY_CARE_PROVIDER_SITE_OTHER): Payer: Medicare HMO | Admitting: Internal Medicine

## 2016-09-15 ENCOUNTER — Encounter: Payer: Self-pay | Admitting: Internal Medicine

## 2016-09-15 VITALS — BP 142/68 | HR 78 | Temp 97.9°F | Ht 64.0 in | Wt 139.0 lb

## 2016-09-15 DIAGNOSIS — Z87448 Personal history of other diseases of urinary system: Secondary | ICD-10-CM

## 2016-09-15 DIAGNOSIS — D500 Iron deficiency anemia secondary to blood loss (chronic): Secondary | ICD-10-CM

## 2016-09-15 DIAGNOSIS — I10 Essential (primary) hypertension: Secondary | ICD-10-CM | POA: Diagnosis not present

## 2016-09-15 DIAGNOSIS — R310 Gross hematuria: Secondary | ICD-10-CM

## 2016-09-15 DIAGNOSIS — J449 Chronic obstructive pulmonary disease, unspecified: Secondary | ICD-10-CM | POA: Diagnosis not present

## 2016-09-15 DIAGNOSIS — Z87898 Personal history of other specified conditions: Secondary | ICD-10-CM

## 2016-09-15 DIAGNOSIS — Q2730 Arteriovenous malformation, site unspecified: Secondary | ICD-10-CM

## 2016-09-15 DIAGNOSIS — R911 Solitary pulmonary nodule: Secondary | ICD-10-CM

## 2016-09-15 MED ORDER — CIPROFLOXACIN HCL 500 MG PO TABS: 500.0000 mg | ORAL_TABLET | Freq: Two times a day (BID) | ORAL | 0 refills | Status: DC

## 2016-09-15 NOTE — Progress Notes (Signed)
Subjective:    Patient ID: Pamela Hampton, female    DOB: 1941-07-27, 76 y.o.   MRN: [id number]  HPI  76 year old patient who has a history of advanced oxygen-dependent COPD. She presents today with concerns about a possible recurrent UTI.  She has occasional dysuria and has noted occasional dried blood on her undergarments.  She wears adult diapers due to incontinence For the past couple years, she has had some hematuria in the setting of a tract infections but no gross hematuria. 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 She was treated with chemotherapy and radiotherapy both external beam followed by implantation. Her last gynecologic check was about 2 years ago.  Her pulmonary status has been fairly stable. She does have a history of a pulmonary nodule and is scheduled for follow-up chest CT next month.  The nodule is worrisome for a primary bronchogenic cancer.  Past Medical History:  Diagnosis Date  . Allergy    Rhinitis  . Anemia   . Arthritis   . Barrett's esophagus 07/19/2004  . Blood transfusion   . COPD (chronic obstructive pulmonary disease) (HCC)    wears 3 liters of o2 continious  . Depression   . Emphysema of lung ([place])   . GERD (gastroesophageal reflux disease)    Barrett's esophagus  . Hepatitis   . Hiatal hernia   . History of oxygen administration    oxygen concentrator @ 4 l/m nasally 24/ 7  . Hyperlipidemia   . Hypertension   . Impaired hearing   . Obesity   . Osteoporosis   . Pneumonia   . Seizures ([place])   . Sleep apnea   . Stroke ([place])   . Ulcer ([place])   . 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 chemo.     Social History   Social History  . Marital status: Divorced    Spouse name: N/A  . Number of children: 4  . Years of education: N/A   Occupational History  . disaled Retired   Social History Main Topics  . Smoking status: Former Smoker    Packs/day:  2.00    Years: 30.00    Types: Cigarettes    Quit date: 07/28/2002  . Smokeless tobacco: Never Used  . Alcohol use No  . Drug use: No  . Sexual activity: Not Currently   Other Topics Concern  . Not on file   Social History Narrative  . No narrative on file    Past Surgical History:  Procedure Laterality Date  . ABDOMINAL HYSTERECTOMY    . abdominal laproscopy surgery    . CHOLECYSTECTOMY    . ESOPHAGOGASTRODUODENOSCOPY (EGD) WITH PROPOFOL N/A 12/21/2014   Procedure: ESOPHAGOGASTRODUODENOSCOPY (EGD) WITH PROPOFOL;  Surgeon: Inda Castle, MD;  Location: [facility];  Service: Endoscopy;  Laterality: N/A;  . HEMORRHOID SURGERY    . JOINT REPLACEMENT    . ORIF HIP FRACTURE Right   . TOTAL HIP ARTHROPLASTY Right   . 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 Heart disease Mother     MI  . Diabetes Mother   . Uterine cancer Mother   . Anemia Mother   . Hypertension Mother   . Stomach cancer Father   . Esophageal cancer Father   . Hypertension Father   . Diabetes  Sister   . Anemia Sister   . Esophageal cancer Brother   . Hypertension Brother   . Diabetes Son   . Diabetes Son   . Brain cancer Brother   . Lung cancer Sister   . Liver cancer Sister   . Colon cancer Neg Hx   . Rectal cancer Neg Hx     No Known Allergies  Current Outpatient Prescriptions on File Prior to Visit  Medication Sig Dispense Refill  . albuterol (PROAIR HFA) 108 (90 Base) MCG/ACT inhaler INHALE 2 PUFFS INTO THE LUNGS EVERY 4 HOURS AS NEEDED FOR SHORTNESS OF BREATH 8.5 g 12  . albuterol (PROVENTIL) (2.5 MG/3ML) 0.083% nebulizer solution INHALE THE CONTENTS OF 1 VIAL VIA NEBULIZER 4 TIMES A DAY. 360 mL 2  . beclomethasone (QVAR) 80 MCG/ACT inhaler Inhale 2 puffs into the lungs 2 (two) times daily. 8.7 g 6  . CRESTOR 20 MG tablet Take 1 tablet (20 mg total) by mouth every other day. 90 tablet  1  . escitalopram (LEXAPRO) 10 MG tablet TAKE 1 TABLET BY MOUTH DAILY. 30 tablet 5  . Ferrous Sulfate Dried 200 (65 FE) MG TABS 1 tablet by mouth twice daily (Patient taking differently: 1 tablet by mouth daily) 30 tablet 6  . fluticasone (FLONASE) 50 MCG/ACT nasal spray PLACE 2 SPRAYS INTO BOTH NOSTRILS DAILY. 16 g 5  . furosemide (LASIX) 40 MG tablet Take 1 tablet (40 mg total) by mouth daily as needed for fluid.    Marland Kitchen ibandronate (BONIVA) 150 MG tablet TAKE IN THE MORNING WITH A FULL GLASS OF WATER, ON AN EMPTY STOMACH ONCE MONTHLY. 4 tablet 3  . ipratropium (ATROVENT) 0.02 % nebulizer solution INHALE THE CONTENTS OF 1 VIAL VIA NEBULIZER 4 TIMES A DAY. 300 mL 6  . losartan (COZAAR) 25 MG tablet TAKE 1 TABLET BY MOUTH DAILY. 90 tablet 1  . omeprazole (PRILOSEC) 20 MG capsule TAKE 1 CAPSULE BY MOUTH ONCE DAILY. 90 capsule 2  . OXYGEN Inhale 4 L/min into the lungs continuous.    . polyethylene glycol powder (GLYCOLAX/MIRALAX) powder Take 17 g by mouth 2 (two) times daily as needed. (Patient taking differently: Take 17 g by mouth daily as needed for mild constipation. ) 3350 g 1  . potassium chloride SA (K-DUR,KLOR-CON) 20 MEQ tablet TAKE 1 TABLET BY MOUTH DAILY. 30 tablet 6  . roflumilast (DALIRESP) 500 MCG TABS tablet Take 1 tablet (500 mcg total) by mouth daily. 30 tablet 11  . rosuvastatin (CRESTOR) 20 MG tablet TAKE 1 TABLET BY MOUTH EVERY OTHER DAY. 90 tablet 2  . sodium chloride (AYR) 0.65 % nasal spray Place 1 spray into the nose as needed for congestion.    . sulfamethoxazole-trimethoprim (BACTRIM DS,SEPTRA DS) 800-160 MG tablet Take 1 tablet by mouth 2 (two) times daily. 14 tablet 0  . traZODone (DESYREL) 100 MG tablet TAKE 1 TABLET BY MOUTH AT BEDTIME. 90 tablet 1   No current facility-administered medications on file prior to visit.     BP (!) 142/68 (BP Location: Left Arm, Patient Position: Sitting, Cuff Size: Normal)   Pulse 78   Temp 97.9 F (36.6 C) (Oral)   Ht 5\' 4"  (1.626 m)    Wt 139 lb (63 kg)   SpO2 98%   BMI 23.86 kg/m     Review of Systems  Genitourinary: Positive for dysuria and hematuria.       Objective:   Physical Exam  Constitutional: She is oriented to person, place, and time.  She appears well-developed and well-nourished. No distress.  Alert No distress  Wheelchair bound, afebrile  Nasal cannula oxygen in place  HENT:  Head: Normocephalic.  Right Ear: External ear normal.  Left Ear: External ear normal.  Mouth/Throat: Oropharynx is clear and moist.  Eyes: Conjunctivae and EOM are normal. Pupils are equal, round, and reactive to light.  Neck: Normal range of motion. Neck supple. No thyromegaly present.  Cardiovascular: Normal rate, regular rhythm, normal heart sounds and intact distal pulses.   Pulmonary/Chest: Effort normal and breath sounds normal.  O2 saturation 98%  Abdominal: Soft. Bowel sounds are normal. She exhibits no mass. There is no tenderness.  Musculoskeletal: Normal range of motion.  Lymphadenopathy:    She has no cervical adenopathy.  Neurological: She is alert and oriented to person, place, and time.  Skin: Skin is warm and dry. No rash noted.  Psychiatric: She has a normal mood and affect. Her behavior is normal.          Assessment & Plan:   History of recurrent UTIs History of dysuria and gross hematuria.  Patient unable to provide a sample today.  Will treat empirically with Cipro for 6 days Due to the gross hematuria, will set up for urological evaluation.  Patient does have a history of gynecologic cancer treated with radiotherapy.  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  Doubt there be recurrence of the gynecologic cancer at this late date.  Rule out primary bladder neoplasm Advanced COPD.  Follow-up pulmonary medicine Pulmonary nodule.  Follow-up medicine.  Follow-up chest CT scheduled for next month Essential hypertension, stable  Sakari Alkhatib Pilar Plate

## 2016-09-15 NOTE — Progress Notes (Signed)
Pre visit review using our clinic review tool, if applicable. No additional management support is needed unless otherwise documented below in the visit note. 

## 2016-09-15 NOTE — Patient Instructions (Signed)
Take your antibiotic as prescribed until ALL of it is gone, but stop if you develop a rash, swelling, or any side effects of the medication.  Contact our office as soon as possible if  there are side effects of the medication.  Drink as much fluid as you  can tolerate over the next few days  Urology follow-up as discussed

## 2016-09-25 ENCOUNTER — Telehealth: Payer: Self-pay | Admitting: Internal Medicine

## 2016-09-25 MED ORDER — PREDNISONE 10 MG PO TABS: ORAL_TABLET | ORAL | 0 refills | Status: DC

## 2016-09-25 NOTE — Telephone Encounter (Signed)
Spoke with pt. She is aware of MR's recommendations. Rx has been sent in. Nothing further was needed. 

## 2016-09-25 NOTE — Telephone Encounter (Signed)
Spoke with pt. States that she is having a COPD flare up. Reports increased SOB. SOB is worse with activity. Denies chest tightness, wheezing or coughing. Pt is wearing 4L 24/7. She would like to have a prescription for prednisone.  MR - please advise. Thanks.

## 2016-09-25 NOTE — Telephone Encounter (Signed)
Please take prednisone 40 mg x1 day, then 30 mg x1 day, then 20 mg x1 day, then 10 mg x1 day, and then 5 mg x1 day and stop  However, usualluy copd flare up has other symptoms worsening beyond dyspnea: so if prednisone not helping she should go to ER  Dr. Brand Males, M.D., [facility].C.P Pulmonary and Critical Care Medicine Staff Physician [facility] Pager: [phone], If no answer or between  15:00h - 7:00h: call [phone]  09/25/2016 4:43 PM

## 2016-10-01 ENCOUNTER — Telehealth: Payer: Self-pay | Admitting: Internal Medicine

## 2016-10-01 ENCOUNTER — Other Ambulatory Visit: Payer: Self-pay | Admitting: Internal Medicine

## 2016-10-01 NOTE — Telephone Encounter (Signed)
Spoke with Butch Penny with [employer], who is requesting a recent medication list to be fax to provided number. List faxed to provided number. Received fax confirmation. Nothing further needed.

## 2016-10-03 ENCOUNTER — Telehealth: Payer: Self-pay | Admitting: Internal Medicine

## 2016-10-03 ENCOUNTER — Other Ambulatory Visit: Payer: Self-pay | Admitting: Internal Medicine

## 2016-10-03 MED ORDER — FERROUS SULFATE DRIED 200 (65 FE) MG PO TABS: ORAL_TABLET | ORAL | 6 refills | Status: DC

## 2016-10-03 MED ORDER — TRAZODONE HCL 100 MG PO TABS: 100.0000 mg | ORAL_TABLET | Freq: Every day | ORAL | 1 refills | Status: AC

## 2016-10-03 NOTE — Telephone Encounter (Signed)
[facility] called to request refill for pt:   Ferrous Sulfate Dried 325 MG TABS 30 day w/ refills  Pharmacy states pt is on an adherence program, even though this is OTC, they need a rx.

## 2016-10-03 NOTE — Telephone Encounter (Signed)
Pharmacy is calling to request a refill traZODone (DESYREL) 100 MG tablet.  Pt is new to that pharmacy otherwise they could send electronically.  Please send to [facility] in [place].

## 2016-10-03 NOTE — Telephone Encounter (Signed)
Rx sent to pharmacy   

## 2016-10-03 NOTE — Telephone Encounter (Signed)
Medication was refilled.

## 2016-10-09 ENCOUNTER — Ambulatory Visit (INDEPENDENT_AMBULATORY_CARE_PROVIDER_SITE_OTHER)
Admission: RE | Admit: 2016-10-09 | Discharge: 2016-10-09 | Disposition: A | Discharge status: Home / Self Care | Payer: Medicare HMO | Source: Ambulatory Visit | Attending: Internal Medicine | Admitting: Internal Medicine

## 2016-10-09 DIAGNOSIS — J449 Chronic obstructive pulmonary disease, unspecified: Secondary | ICD-10-CM | POA: Diagnosis not present

## 2016-10-09 DIAGNOSIS — R911 Solitary pulmonary nodule: Secondary | ICD-10-CM | POA: Diagnosis not present

## 2016-10-16 ENCOUNTER — Encounter: Payer: Self-pay | Admitting: Internal Medicine

## 2016-10-16 ENCOUNTER — Ambulatory Visit (INDEPENDENT_AMBULATORY_CARE_PROVIDER_SITE_OTHER): Payer: Medicare HMO | Admitting: Internal Medicine

## 2016-10-16 DIAGNOSIS — R911 Solitary pulmonary nodule: Secondary | ICD-10-CM | POA: Diagnosis not present

## 2016-10-16 DIAGNOSIS — J449 Chronic obstructive pulmonary disease, unspecified: Secondary | ICD-10-CM | POA: Diagnosis not present

## 2016-10-16 NOTE — Patient Instructions (Signed)
Stage 4 very severe COPD by GOLD classification ([place]) Copd stable  Plan Continue 3-4 o2 , daliresp and nebulizers as before   Nodule of right lung Right lower lobe nodule has enlarged to 1cm from 61mm in summer 2017 Likely early stage 1 lung cancer  Plan - PET scan to adjust probability of cancer  - will look into onc-immune blood test  Followup Return next few weeks to see myself or Dr Lamonte Sakai for PET scan results

## 2016-10-16 NOTE — Assessment & Plan Note (Signed)
Right lower lobe nodule has enlarged to 1cm from 70mm in summer 2017 Likely early stage 1 lung cancer  Plan - PET scan to adjust probability of cancer  - will look into onc-immune blood test  Followup Return next few weeks to see myself or Dr Lamonte Sakai for PET scan results

## 2016-10-16 NOTE — Progress Notes (Signed)
Subjective:     Patient ID: Pamela Hampton, female   DOB: 1941-06-06, 76 y.o.   MRN: [id number]  PCP Nyoka Cowden, MD   HPI    PCP Nyoka Cowden, MD  # 60 pack smoker quit 2004.   # Frequent UTI. Gyn cancer 1998  -s/p chemo Rx and. xrt = strong family hx of cancer  #lung imagin  - Feb 2013: No evicende of cancer on CT Chest - dec 2013 0 clear cxrf  #Obesity  - >  30# weight gain since 2010-2012  - Body mass index is 34.86 kg/(m^2).   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 Ratio 51 and DLCO 6.7/30\ Status post pulmonary rehabilitation summer 2013   #AECOPD - September 2013 treated in the office with doxycycline and prednisone on 04/14/2012 - December 2013 4 emergency room treatment with prednisone and antibiotics - July 2014 office treatment with Levaquin and prednisone - Nov 2014: office Rx - March 2015: office telpehone Rx - doxy/[pred - July 2015 - telephone RX pred alone - heat related aecopd - dec 2015 - telephone Rx with pred alone  ...................... OV 03/13/2014  Chief Complaint  Patient presents with  . Follow-up    Pt states her breathing is unchanged. Pt c/o DOE, prod cough with green and white. pt states her cough is worse in monring and when outdoors. Pt states when she becomes very dyspneic then she will experience mid sternal  CP. Pt states she received 2 units of blood in 01/2014 d/t hgb at 7.5   FU COPD with cor pulmonale  - doing well. NO admissions since nov/dec 2014 when last seen. [place] [facility] 2015: Rx over phone for aecopd. Continues qvar and duoneb without a problem. On 4L o2.   Past hx: has chronic anemia and 4 wweeks ago PCP Nyoka Cowden, MD gave 2 units prbc for hgg 7s. Feels better after that.  Also reports chronic nasal stuffiness and head cold and wants ENT referral   OV 11/06/2014  Chief Complaint  Patient presents with  . Follow-up    Pt c/o prod cough with yellow mucus, SOB with activity. Right sided chest tightness occasionally. Denies any chest congestion, n/v/d. Wants to talk about going back to symbicort.   FU COPD with cor pulmonarle  - last seen August 2015. Since that she called in once in December 2015 for COPD exacerbation and received prednisone alonefor an exacerbation. She is on duo neb and Qvar and according to her and her female companion who is with her today she is fairly compliant with this. 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 She got some injection into the eye and since then she's had partial recovery of vision. Despite this she is able to see and uses inhalers   Labs Jan 2016 hgb 9.5gm% which appears stable   OV 06/07/2015  Chief Complaint  Patient presents with  . Follow-up    Pt c/o sinus congestion, PND, prod cough with brown mucus. Pt states her SOB is at baseline. Pt denies CP/tightness.  Follow-up COPD with chronic hypoxemia and respiratory failure and cor pulmonale  - Last seen in April 2016. Since then his called in for an exacerbation. Currently she presents with a female companion. She is on chronic 4 L oxygen. She is on duo neb and Qvar. They both report compliance. She reports good stable health. For the last week she's having increased sinus drainage that is dirty brown in color. She does not know her baseline color but she does note that this is a change from her baseline. There is no fever or worsening cough or wheezing or chest tightness. She otherwise feels good in terms of energy level. This no hemoptysis. This no edema getting worse.  Chest x-ray April  2016 reported to be clear other than large hiatal hernia and COPD. Not personally visualized   OV 12/04/2015  Chief Complaint  Patient presents with  . Follow-up    Pt states her breathing has worsened since last OV. Pt c/o prod cough with yellow mucus. Pt c/o nasal congestion.    Follow-up COPD with chronic hypoxemia and respiratory failure and cor pulmonale  She presents with the family. Last seen in November 2016. Since then she's had one or 2 exacerbations. She also reports having had some rash seen by primary care physician but this is resolved. She is also dealing with chronic sinus issues and dry. I notice that she's not using Nettie pot saline wash which I recommended. She feels more deconditioned than more chronically worsening shortness of breath and in the past. She is due to get home physical therapy. She has chronic fatigue. She hopes home physical therapy will help her. There is no baseline edema. Currently is the last few days of a COPD exacerbation treatment and she is feeling better in that sense. She is open to trying Roflumilast wall preventing COPD exacerbations.    OV 03/11/2016  Chief Complaint  Patient presents with  . Follow-up    Pt states she feels her breathing has worsened since last OV. Pt c/o loss of appetite and prod cough with clear mucus. Pt denies CP/tightness and f/c/s.     Follow-up COPD advanced with chronic hypoxemic respiratory failure on oxygen high symptom burden on Scope  Last visit May 2017. Due to recurrent exacerbations be started her on roflumilast. After that according to chart review she was seen by primary care physician for iron deficiency anemia. She also in June 2017 had an admission for dehydration. In talking to her apparently her daughter thinks all the side effects are coming because of the roflumilast. In addition patient has lost significant amount of weight. Since last visit when she was 164 poundsshe has lost nearly 20 pounds and is  currently at 145 pounds. However patient categorically states that the side effects are not because of roflumilast. She denies any ongoing nausea vomiting. She does have a large hiatal herniaand she does not think the carina splaying any role in makingher lose weight. She says she was losing weight even before starting roflulimilast. In fact in April 2016 she weighed 187 pounds so she at least 17 pound weight loss in 1 year prior to starting rolflumilast. She absolutely refuses to stop roflumilast even for  first time limited trial. Feels COPD is improved.      OV 04/11/2016 Chief Complaint  Patient presents with  . Follow-up    Pt here to review CT chest. Pt c/o prod cough with yellow mucus. Pt denies changes in breathing.  Gold stage 4 copd with chronic hypoxemic resp failure   - Seen last month. Because of weight loss or do CT chest. Also ordered lab work in the key findings are as below. CT chest was personally visualized.  1. Anemia - unchanged from baseline  2. CT chest - large hiatal hernia and can explain weight loss  3. RLL nodule 83mm - suspicion for early stage lung cancer but too small to biopsy or do anything about  4. weight loss - 144# 04/11/2016 (1# loss since last visit in august) - denies any dysphagia from her hiatal hernia  5. Other issue: She presents with her daughter. Daughter states and patient also states new issue of increased cough, yellow sputum, increased chest tightness and wheeze since the rains last week. . No fever or no chills no hemoptysis. She continues to take Roflumilastlast without any problem   OV 10/16/2016    Chief Complaint  Patient presents with  . Follow-up    Pt here for 6 month f/u. Pt states her breathing has worsened since last OV. Pt c/o prod cough - pt unsure of color.    Gold stage IV COPD with chronic hypoxemic respiratory failure  Presents with her daughter. Although she didn't tell the intake nurse that her arrest or sinuses  were she tells me repeatedly that overall she's stable in terms of a cough shortness of breath and sputum production. The recent weather fluctuations between [facility], warm weather and rain amended bit rundown with her symptoms but overall she feels she is stable. She continues with dalriesp and her nebulizers and oxygen. Was no change.  Other issue: right lower lobe 7 mm lung nodule noted in August 2017. She had follow-up CT 10/09/2016: The nodule is now enlarged to 1 cm    CAT COPD Symptom & Quality of Life Score (GSK trademark) 0 is no burden. 5 is highest burden 04/14/2012  08/03/2012  02/14/2013  06/10/2013   Never Cough -> Cough all the time 1 4, baseline 2 4  No phlegm in chest -> Chest is full of phlegm 5 3, baseline 5 4  No chest tightness -> Chest feels very tight 1 0, /imrpoved 4 4  No dyspnea for 1 flight stairs/hill -> Very dyspneic for 1 flight of stairs 5 5, "improved" 5 5  No limitations for ADL at home -> Very limited with ADL at home 3 5 5 5   Confident leaving home -> Not at all confident leaving home 0 2 3 4   Sleep soundly -> Do not sleep soundly because of lung condition 3 4 4 5   Lots of Energy -> No energy at all 3 5 5 5   TOTAL Score (max 40)  21 28 32 36      IMPRESSION: cmpare to august 2017 1. Spiculated 1.0 cm right lower lobe pulmonary nodule is mildly increased in size and demonstrates increasing spiculation, suspicious for primary bronchogenic carcinoma. PET-CT and multidisciplinary thoracic oncology referral recommended. 2. No thoracic adenopathy or findings of metastatic disease in the chest. 3. Moderate emphysema with diffuse bronchial wall thickening, compatible with the provided history of COPD. 4. Additional findings include aortic atherosclerosis, left main and 3 vessel coronary atherosclerosis and moderate hiatal hernia.   Electronically Signed   By: Ilona Sorrel M.D.   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   reports that she quit smoking about 14 years ago. Her smoking use included Cigarettes. She has a 60.00 pack-year smoking history. She has never used smokeless tobacco.  Past Surgical History:  Procedure Laterality Date  . ABDOMINAL HYSTERECTOMY    . abdominal laproscopy surgery    . CHOLECYSTECTOMY    . ESOPHAGOGASTRODUODENOSCOPY (EGD) WITH PROPOFOL N/A 12/21/2014   Procedure: ESOPHAGOGASTRODUODENOSCOPY (EGD) WITH PROPOFOL;  Surgeon: Inda Castle, MD;  Location: [facility];  Service: Endoscopy;  Laterality: N/A;  . HEMORRHOID SURGERY    . JOINT REPLACEMENT    . ORIF HIP FRACTURE Right   . TOTAL HIP ARTHROPLASTY Right   . 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 Influenza Split 05/07/2011, 04/14/2012, 06/06/2013  . Influenza Whole 07/08/2007, 04/25/2008, 06/12/2009  . Influenza, High Dose Seasonal PF 08/07/2014, 04/24/2015, 04/11/2016  . Pneumococcal Conjugate-13 08/07/2014  . Pneumococcal Polysaccharide-23 07/07/2011  . Tetanus 04/12/2013  . Zoster 04/19/2012    Family History  Problem Relation Age of Onset  . Heart disease Mother     MI  . Diabetes Mother   . Uterine cancer Mother   . Anemia Mother   . Hypertension Mother   . Stomach cancer Father   . Esophageal cancer Father   . Hypertension Father   . Diabetes Sister   . Anemia Sister   . Esophageal cancer Brother   . Hypertension Brother   . Diabetes Son   . Diabetes  Son   . Brain cancer Brother   . Lung cancer Sister   . Liver cancer Sister   . Colon cancer Neg Hx   . Rectal cancer Neg Hx      Current Outpatient Prescriptions:  .  albuterol (PROAIR HFA) 108 (90 Base) MCG/ACT inhaler, INHALE 2 PUFFS INTO THE LUNGS EVERY 4 HOURS AS NEEDED FOR SHORTNESS OF BREATH, Disp: 8.5 g, Rfl: 12 .  albuterol (PROVENTIL) (2.5 MG/3ML) 0.083% nebulizer solution, INHALE THE CONTENTS OF 1 VIAL VIA NEBULIZER 4 TIMES A DAY., Disp: 360 mL, Rfl: 2 .  beclomethasone (QVAR) 80 MCG/ACT inhaler, Inhale 2 puffs into the lungs 2 (two) times daily., Disp: 8.7 g, Rfl: 6 .  CRESTOR 20 MG tablet, Take 1 tablet (20 mg total) by mouth every other day., Disp: 90 tablet, Rfl: 1 .  escitalopram (LEXAPRO) 10 MG tablet, TAKE 1 TABLET BY MOUTH DAILY., Disp: 30 tablet, Rfl: 5 .  Ferrous Sulfate Dried 200 (65 Fe) MG TABS, 1 tablet by mouth twice daily, Disp: 30 tablet, Rfl: 6 .  fluticasone (FLONASE) 50 MCG/ACT nasal spray, PLACE 2 SPRAYS INTO BOTH NOSTRILS DAILY., Disp: 16 g, Rfl: 5 .  furosemide (LASIX) 40 MG tablet, Take 1 tablet (40 mg total) by mouth daily as needed for fluid., Disp: , Rfl:  .  ibandronate (BONIVA) 150 MG tablet, take 1 tablet by mouth in the morning on an empty stomach once monthly, Disp: 1 tablet, Rfl: 5 .  ipratropium (ATROVENT) 0.02 % nebulizer solution, INHALE THE CONTENTS OF 1 VIAL VIA NEBULIZER 4 TIMES A DAY., Disp: 300 mL, Rfl: 6 .  losartan (COZAAR) 25 MG tablet, TAKE 1 TABLET BY MOUTH DAILY., Disp: 90 tablet, Rfl: 1 .  omeprazole (PRILOSEC) 20 MG  capsule, TAKE 1 CAPSULE BY MOUTH ONCE DAILY., Disp: 90 capsule, Rfl: 2 .  OXYGEN, Inhale 4 L/min into the lungs continuous., Disp: , Rfl:  .  polyethylene glycol powder (GLYCOLAX/MIRALAX) powder, Take 17 g by mouth 2 (two) times daily as needed. (Patient taking differently: Take 17 g by mouth daily as needed for mild constipation. ), Disp: 3350 g, Rfl: 1 .  potassium chloride SA (K-DUR,KLOR-CON) 20 MEQ tablet, TAKE 1  TABLET BY MOUTH DAILY., Disp: 30 tablet, Rfl: 6 .  roflumilast (DALIRESP) 500 MCG TABS tablet, Take 1 tablet (500 mcg total) by mouth daily., Disp: 30 tablet, Rfl: 11 .  rosuvastatin (CRESTOR) 20 MG tablet, TAKE 1 TABLET BY MOUTH EVERY OTHER DAY., Disp: 90 tablet, Rfl: 2 .  sodium chloride (AYR) 0.65 % nasal spray, Place 1 spray into the nose as needed for congestion., Disp: , Rfl:  .  sulfamethoxazole-trimethoprim (BACTRIM DS,SEPTRA DS) 800-160 MG tablet, Take 1 tablet by mouth 2 (two) times daily., Disp: 14 tablet, Rfl: 0 .  traZODone (DESYREL) 100 MG tablet, Take 1 tablet (100 mg total) by mouth at bedtime., Disp: 90 tablet, Rfl: 1    Review of Systems     Objective:   Physical Exam Vitals:   10/16/16 1120  BP: (!) 144/72  Pulse: 74  SpO2: 96%  Weight: 142 lb 3.2 oz (64.5 kg)  Height: 5\' 3"  (1.6 m)    Frail female sitting in the wheelchair. Pleasant no distress no wheeze on exam overall air entry is diminished expiration is prolonged. Normal heart sounds. Abdomen is soft    Assessment:       ICD-9-CM ICD-10-CM   1. Stage 4 very severe COPD by GOLD classification ([place]) 496 J44.9   2. Nodule of right lung 793.11 R91.1        Plan:     Stage 4 very severe COPD by GOLD classification ([place]) Copd stable  Plan Continue 3-4 o2 , daliresp and nebulizers as before   Nodule of right lung Right lower lobe nodule has enlarged to 1cm from 23mm in summer 2017 Likely early stage 1 lung cancer  Plan - PET scan to adjust probability of cancer  - will look into onc-immune blood test  Followup Return next few weeks to see myself or Dr Lamonte Sakai for PET scan results  > 50% of this > 25 min visit spent in face to face counseling or coordination of care   Dr. Brand Males, M.D., [facility].C.P Pulmonary and Critical Care Medicine Staff Physician [facility] Pager: [phone], If no answer or between  15:00h - 7:00h: call [phone]  10/16/2016 11:37 AM

## 2016-10-16 NOTE — Assessment & Plan Note (Signed)
Copd stable  Plan Continue 3-4 o2 , daliresp and nebulizers as before

## 2016-10-17 ENCOUNTER — Other Ambulatory Visit: Payer: Self-pay | Admitting: Internal Medicine

## 2016-10-17 NOTE — Progress Notes (Signed)
Pt seen at [place], results were discussed. Will sign off.

## 2016-10-22 ENCOUNTER — Encounter: Payer: Self-pay | Admitting: Internal Medicine

## 2016-10-22 ENCOUNTER — Ambulatory Visit (INDEPENDENT_AMBULATORY_CARE_PROVIDER_SITE_OTHER): Payer: Medicare HMO | Admitting: Internal Medicine

## 2016-10-22 VITALS — BP 122/76 | HR 75 | Temp 98.0°F | Ht 63.0 in | Wt 138.8 lb

## 2016-10-22 DIAGNOSIS — R911 Solitary pulmonary nodule: Secondary | ICD-10-CM

## 2016-10-22 DIAGNOSIS — I10 Essential (primary) hypertension: Secondary | ICD-10-CM

## 2016-10-22 DIAGNOSIS — J449 Chronic obstructive pulmonary disease, unspecified: Secondary | ICD-10-CM

## 2016-10-22 NOTE — Patient Instructions (Signed)
Limit your sodium (Salt) intake  PET scan/pulmonary follow-up as scheduled  Return in 6 months for follow-up

## 2016-10-22 NOTE — Progress Notes (Signed)
Pre visit review using our clinic review tool, if applicable. No additional management support is needed unless otherwise documented below in the visit note. 

## 2016-10-22 NOTE — Progress Notes (Signed)
Subjective:    Patient ID: Pamela Hampton, female    DOB: [birth year], 76 y.o.   MRN: [id number]  HPI  76 year old patient who is seen today in follow-up. She is followed bipolar medicine due to advanced COPD as well as an enlarging spiculated nodule involving the right lung.  She is scheduled for a PET scan in 2 days Clinically she is doing well. Remains on chronic oxygen therapy.  Past Medical History:  Diagnosis Date  . Allergy    Rhinitis  . Anemia   . Arthritis   . Barrett's esophagus 07/19/2004  . Blood transfusion   . COPD (chronic obstructive pulmonary disease) (H[facility])    wears 3 liters of o2 continious  . Depression   . Emphysema of lung ([place])   . GERD (gastroesophageal reflux disease)    Barrett's esophagus  . Hepatitis   . Hiatal hernia   . History of oxygen administration    oxygen concentrator @ 4 l/m nasally 24/ 7  . Hyperlipidemia   . Hypertension   . Impaired hearing   . Obesity   . Osteoporosis   . Pneumonia   . Seizures ([place])   . Sleep apnea   . Stroke ([place])   . Ulcer ([place])   . 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 radiation. chemo.     Social History   Social History  . Marital status: Divorced    Spouse name: N/A  . Number of children: 4  . Years of education: N/A   Occupational History  . disaled Retired   Social History Main Topics  . Smoking status: Former Smoker    Packs/day: 2.00    Years: 30.00    Types: Cigarettes    Quit date: 07/28/2002  . Smokeless tobacco: Never Used  . Alcohol use No  . Drug use: No  . Sexual activity: Not Currently   Other Topics Concern  . Not on file   Social History Narrative  . No narrative on file    Past Surgical History:  Procedure Laterality Date  . ABDOMINAL HYSTERECTOMY    . abdominal laproscopy surgery    . CHOLECYSTECTOMY    . ESOPHAGOGASTRODUODENOSCOPY (EGD) WITH PROPOFOL N/A 12/21/2014   Procedure: ESOPHAGOGASTRODUODENOSCOPY (EGD) WITH PROPOFOL;  Surgeon: Inda Castle, MD;  Location: [facility];  Service: Endoscopy;  Laterality: N/A;  . HEMORRHOID SURGERY    . JOINT REPLACEMENT    . ORIF HIP FRACTURE Right   . TOTAL HIP ARTHROPLASTY Right   . 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 Heart disease Mother     MI  . Diabetes Mother   . Uterine cancer Mother   . Anemia Mother   . Hypertension Mother   . Stomach cancer Father   . Esophageal cancer Father   . Hypertension Father   . Diabetes Sister   . Anemia Sister   . Esophageal cancer Brother   . Hypertension Brother   . Diabetes Son   . Diabetes Son   . Brain cancer Brother   . Lung cancer Sister   . Liver cancer Sister   . Colon cancer Neg Hx   . Rectal cancer Neg Hx     No Known Allergies  Current Outpatient Prescriptions on File Prior to Visit  Medication Sig Dispense Refill  . albuterol (PROAIR HFA) 108 (90 Base) MCG/ACT inhaler  INHALE 2 PUFFS INTO THE LUNGS EVERY 4 HOURS AS NEEDED FOR SHORTNESS OF BREATH 8.5 g 12  . albuterol (PROVENTIL) (2.5 MG/3ML) 0.083% nebulizer solution INHALE THE CONTENTS OF 1 VIAL VIA NEBULIZER 4 TIMES A DAY. 360 mL 2  . beclomethasone (QVAR) 80 MCG/ACT inhaler Inhale 2 puffs into the lungs 2 (two) times daily. 8.7 g 6  . CRESTOR 20 MG tablet Take 1 tablet (20 mg total) by mouth every other day. (Patient taking differently: Take 20 mg by mouth daily. ) 90 tablet 1  . escitalopram (LEXAPRO) 10 MG tablet TAKE 1 TABLET BY MOUTH DAILY. 30 tablet 5  . Ferrous Sulfate Dried 200 (65 Fe) MG TABS 1 tablet by mouth twice daily (Patient taking differently: 1 tablet by mouth daily) 30 tablet 6  . fluticasone (FLONASE) 50 MCG/ACT nasal spray PLACE 2 SPRAYS INTO BOTH NOSTRILS DAILY. 16 g 5  . ibandronate (BONIVA) 150 MG tablet take 1 tablet by mouth in the morning on an empty stomach once monthly 1 tablet 5  . ipratropium (ATROVENT) 0.02 % nebulizer solution  INHALE THE CONTENTS OF 1 VIAL VIA NEBULIZER 4 TIMES A DAY. 300 mL 7  . losartan (COZAAR) 25 MG tablet TAKE 1 TABLET BY MOUTH DAILY. 90 tablet 1  . omeprazole (PRILOSEC) 20 MG capsule TAKE 1 CAPSULE BY MOUTH ONCE DAILY. 90 capsule 2  . OXYGEN Inhale 4 L/min into the lungs continuous.    . polyethylene glycol powder (GLYCOLAX/MIRALAX) powder Take 17 g by mouth 2 (two) times daily as needed. (Patient taking differently: Take 17 g by mouth daily as needed for mild constipation. ) 3350 g 1  . roflumilast (DALIRESP) 500 MCG TABS tablet Take 1 tablet (500 mcg total) by mouth daily. 30 tablet 11  . sodium chloride (AYR) 0.65 % nasal spray Place 1 spray into the nose as needed for congestion.    . traZODone (DESYREL) 100 MG tablet Take 1 tablet (100 mg total) by mouth at bedtime. 90 tablet 1   No current facility-administered medications on file prior to visit.     BP 122/76 (BP Location: Left Arm, Patient Position: Sitting, Cuff Size: Normal)   Pulse 75   Temp 98 F (36.7 C) (Oral)   Ht 5\' 3"  (1.6 m)   Wt 138 lb 12.8 oz (63 kg)   SpO2 96%   BMI 24.59 kg/m     Review of Systems  Constitutional: Positive for fatigue.  HENT: Negative for congestion, dental problem, hearing loss, rhinorrhea, sinus pressure, sore throat and tinnitus.   Eyes: Negative for pain, discharge and visual disturbance.  Respiratory: Positive for shortness of breath. Negative for cough.   Cardiovascular: Negative for chest pain, palpitations and leg swelling.  Gastrointestinal: Negative for abdominal distention, abdominal pain, blood in stool, constipation, diarrhea, nausea and vomiting.  Genitourinary: Negative for difficulty urinating, dysuria, flank pain, frequency, hematuria, pelvic pain, urgency, vaginal bleeding, vaginal discharge and vaginal pain.  Musculoskeletal: Negative for arthralgias, gait problem and joint swelling.  Skin: Negative for rash.  Neurological: Positive for weakness. Negative for dizziness,  syncope, speech difficulty, numbness and headaches.  Hematological: Negative for adenopathy.  Psychiatric/Behavioral: Negative for agitation, behavioral problems and dysphoric mood. The patient is not nervous/anxious.        Objective:   Physical Exam  Constitutional: She is oriented to person, place, and time. She appears well-developed and well-nourished.  Nasal cannula O2  HENT:  Head: Normocephalic.  Right Ear: External ear normal.  Left Ear: External ear  normal.  Mouth/Throat: Oropharynx is clear and moist.  Eyes: Conjunctivae and EOM are normal. Pupils are equal, round, and reactive to light.  Neck: Normal range of motion. Neck supple. No thyromegaly present.  Cardiovascular: Normal rate, regular rhythm, normal heart sounds and intact distal pulses.   Pulmonary/Chest: Effort normal.  Diminished breath sounds but clear  Abdominal: Soft. Bowel sounds are normal. She exhibits no mass. There is no tenderness.  Musculoskeletal: Normal range of motion.  Lymphadenopathy:    She has no cervical adenopathy.  Neurological: She is alert and oriented to person, place, and time.  Skin: Skin is warm and dry. No rash noted.  Psychiatric: She has a normal mood and affect. Her behavior is normal.          Assessment & Plan:   Advanced COPD Enlarging spiculated right lung nodule.  Follow-up pulmonary medicine Essential hypertension, stable  Follow-up 6 months  [id number],PETER Pilar Plate

## 2016-10-24 ENCOUNTER — Ambulatory Visit (HOSPITAL_COMMUNITY)
Admission: RE | Admit: 2016-10-24 | Discharge: 2016-10-24 | Disposition: A | Discharge status: Home / Self Care | Payer: Medicare HMO | Source: Ambulatory Visit | Attending: Internal Medicine | Admitting: Internal Medicine

## 2016-10-24 DIAGNOSIS — R911 Solitary pulmonary nodule: Secondary | ICD-10-CM

## 2016-10-27 ENCOUNTER — Other Ambulatory Visit: Payer: Self-pay | Admitting: Internal Medicine

## 2016-10-27 DIAGNOSIS — N3946 Mixed incontinence: Secondary | ICD-10-CM | POA: Diagnosis not present

## 2016-10-27 DIAGNOSIS — R310 Gross hematuria: Secondary | ICD-10-CM | POA: Diagnosis not present

## 2016-10-27 DIAGNOSIS — R350 Frequency of micturition: Secondary | ICD-10-CM | POA: Diagnosis not present

## 2016-10-27 DIAGNOSIS — N3021 Other chronic cystitis with hematuria: Secondary | ICD-10-CM | POA: Diagnosis not present

## 2016-10-30 ENCOUNTER — Encounter (HOSPITAL_COMMUNITY)
Admission: RE | Admit: 2016-10-30 | Discharge: 2016-10-30 | Disposition: A | Discharge status: Home / Self Care | Payer: Medicare HMO | Source: Ambulatory Visit | Attending: Internal Medicine | Admitting: Internal Medicine

## 2016-10-30 DIAGNOSIS — R911 Solitary pulmonary nodule: Secondary | ICD-10-CM | POA: Diagnosis not present

## 2016-10-30 DIAGNOSIS — J449 Chronic obstructive pulmonary disease, unspecified: Secondary | ICD-10-CM | POA: Diagnosis not present

## 2016-10-30 LAB — GLUCOSE, CAPILLARY: Glucose-Capillary: 93 mg/dL (ref 65–99)

## 2016-10-30 MED ORDER — FLUDEOXYGLUCOSE F - 18 (FDG) INJECTION
6.9400 | Freq: Once | INTRAVENOUS | Status: AC | PRN
  Administered 2016-10-30: 6.94 via INTRAVENOUS

## 2016-11-05 ENCOUNTER — Telehealth: Payer: Self-pay | Admitting: Internal Medicine

## 2016-11-05 NOTE — Telephone Encounter (Signed)
Both pt CNA and pt aware of results and voiced her understanding. Per pt last colonoscopy was about a year ago. Pt's caregiver states pt does not currently have a wound on L side of back  Will route to MR as a FYI.

## 2016-11-05 NOTE — Telephone Encounter (Signed)
Patient called requesting results . She can be reached at [phone] -pr

## 2016-11-05 NOTE — Telephone Encounter (Signed)
Patient returning call - she can be reached at [phone] -pr

## 2016-11-05 NOTE — Telephone Encounter (Signed)
  IMPRESSION: 1. The right lower lobe nodule is not visibly hypermetabolic. The solid portion of this nodule measures 6 by 5 by 5 mm although counting the thin marginal spiculations the nodule was about 1 cm in diameter. Given how small the solid portion is, I am not convinced that this process is benign. I would suggest continued surveillance. Given the small size this may be tricky to obtain a percutaneous biopsy. Another option would simply be presumptive wedge resection. 2. Focal hypermetabolic activity of the junction of the descending and proximal sigmoid colon. A neoplastic polyp could have this appearance and if not recently performed, colonoscopy would be suggested. 3. Low-grade metabolic activity associated with a soft tissue density with speckled calcification along the left posterior margin of the sacrum primarily in the subcutaneous tissues. This could be an inflammatory process in the setting of the chronic wound in this vicinity. Epidermal cysts could have this appearance. Sacral chordoma seems less likely given the lack of destructive bony findings and the subcutaneous positioning. Correlate with visual inspection of this region in determining whether biopsy is warranted. 4. Other imaging findings of potential clinical significance: Right mastoid effusion. 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 Severe emphysema. Bilateral airway thickening. Moderate-sized type 1 hiatal hernia. Coronary, aortic arch, and branch vessel atherosclerotic vascular disease. Aortoiliac atherosclerotic vascular disease. 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   Electronically Signed By: Van Clines M.D. On: 10/30/2016 12:15             [name]  PET scan with severl findings.   1. Let her know that the PET scan is not telling us for sure if this Is cancer or not in lung 2. Also ask when her lst colonoscopyu was 3. Also ask if in her left back  she has a wound  I will discuss on visit 11/27/16  Thanks  .Dr. Brand Males, M.D., [facility].C.P Pulmonary and Critical Care Medicine Staff Physician [facility] Pager: [phone], If no answer or between  15:00h - 7:00h: call [phone]  11/05/2016 2:30 AM

## 2016-11-05 NOTE — Telephone Encounter (Signed)
[id number] x1 

## 2016-11-09 DIAGNOSIS — J449 Chronic obstructive pulmonary disease, unspecified: Secondary | ICD-10-CM | POA: Diagnosis not present

## 2016-11-12 DIAGNOSIS — R319 Hematuria, unspecified: Secondary | ICD-10-CM | POA: Diagnosis not present

## 2016-11-12 DIAGNOSIS — N3021 Other chronic cystitis with hematuria: Secondary | ICD-10-CM | POA: Diagnosis not present

## 2016-11-12 DIAGNOSIS — R310 Gross hematuria: Secondary | ICD-10-CM | POA: Diagnosis not present

## 2016-11-25 ENCOUNTER — Encounter: Payer: Self-pay | Admitting: Internal Medicine

## 2016-11-27 ENCOUNTER — Ambulatory Visit: Payer: Medicare HMO | Admitting: Internal Medicine

## 2016-12-03 ENCOUNTER — Other Ambulatory Visit: Payer: Self-pay | Admitting: Internal Medicine

## 2016-12-05 ENCOUNTER — Emergency Department (HOSPITAL_COMMUNITY): Payer: Medicare HMO

## 2016-12-05 ENCOUNTER — Emergency Department (HOSPITAL_COMMUNITY)
Admission: EM | Admit: 2016-12-05 | Discharge: 2016-12-06 | Disposition: A | Discharge status: Home / Self Care | Payer: Medicare HMO | Attending: Emergency Medicine | Admitting: Emergency Medicine

## 2016-12-05 ENCOUNTER — Encounter (HOSPITAL_COMMUNITY): Payer: Self-pay | Admitting: Emergency Medicine

## 2016-12-05 DIAGNOSIS — I6523 Occlusion and stenosis of bilateral carotid arteries: Secondary | ICD-10-CM | POA: Diagnosis not present

## 2016-12-05 DIAGNOSIS — R51 Headache: Secondary | ICD-10-CM | POA: Diagnosis not present

## 2016-12-05 DIAGNOSIS — X58XXXA Exposure to other specified factors, initial encounter: Secondary | ICD-10-CM | POA: Insufficient documentation

## 2016-12-05 DIAGNOSIS — Z87891 Personal history of nicotine dependence: Secondary | ICD-10-CM | POA: Insufficient documentation

## 2016-12-05 DIAGNOSIS — Z8540 Personal history of malignant neoplasm of unspecified female genital organ: Secondary | ICD-10-CM | POA: Insufficient documentation

## 2016-12-05 DIAGNOSIS — Z7982 Long term (current) use of aspirin: Secondary | ICD-10-CM | POA: Insufficient documentation

## 2016-12-05 DIAGNOSIS — S161XXA Strain of muscle, fascia and tendon at neck level, initial encounter: Secondary | ICD-10-CM | POA: Diagnosis not present

## 2016-12-05 DIAGNOSIS — Y999 Unspecified external cause status: Secondary | ICD-10-CM | POA: Diagnosis not present

## 2016-12-05 DIAGNOSIS — R05 Cough: Secondary | ICD-10-CM | POA: Diagnosis not present

## 2016-12-05 DIAGNOSIS — Z8673 Personal history of transient ischemic attack (TIA), and cerebral infarction without residual deficits: Secondary | ICD-10-CM | POA: Insufficient documentation

## 2016-12-05 DIAGNOSIS — Y939 Activity, unspecified: Secondary | ICD-10-CM | POA: Diagnosis not present

## 2016-12-05 DIAGNOSIS — I10 Essential (primary) hypertension: Secondary | ICD-10-CM | POA: Diagnosis not present

## 2016-12-05 DIAGNOSIS — R519 Headache, unspecified: Secondary | ICD-10-CM

## 2016-12-05 DIAGNOSIS — M542 Cervicalgia: Secondary | ICD-10-CM

## 2016-12-05 DIAGNOSIS — Z96641 Presence of right artificial hip joint: Secondary | ICD-10-CM | POA: Diagnosis not present

## 2016-12-05 DIAGNOSIS — Y929 Unspecified place or not applicable: Secondary | ICD-10-CM | POA: Diagnosis not present

## 2016-12-05 DIAGNOSIS — S199XXA Unspecified injury of neck, initial encounter: Secondary | ICD-10-CM | POA: Diagnosis present

## 2016-12-05 DIAGNOSIS — J449 Chronic obstructive pulmonary disease, unspecified: Secondary | ICD-10-CM | POA: Diagnosis not present

## 2016-12-05 DIAGNOSIS — R52 Pain, unspecified: Secondary | ICD-10-CM | POA: Diagnosis not present

## 2016-12-05 LAB — CBC WITH DIFFERENTIAL/PLATELET
BASOS ABS: 0 10*3/uL (ref 0.0–0.1)
Basophils Relative: 0 %
EOS ABS: 0 10*3/uL (ref 0.0–0.7)
EOS PCT: 1 %
HCT: 30.8 % — ABNORMAL LOW (ref 36.0–46.0)
Hemoglobin: 9.6 g/dL — ABNORMAL LOW (ref 12.0–15.0)
Lymphocytes Relative: 46 %
Lymphs Abs: 2.3 10*3/uL (ref 0.7–4.0)
MCH: 28.3 pg (ref 26.0–34.0)
MCHC: 31.2 g/dL (ref 30.0–36.0)
MCV: 90.9 fL (ref 78.0–100.0)
Monocytes Absolute: 0.7 10*3/uL (ref 0.1–1.0)
Monocytes Relative: 13 %
Neutro Abs: 2 10*3/uL (ref 1.7–7.7)
Neutrophils Relative %: 40 %
PLATELETS: 266 10*3/uL (ref 150–400)
RBC: 3.39 MIL/uL — AB (ref 3.87–5.11)
RDW: 16.8 % — ABNORMAL HIGH (ref 11.5–15.5)
WBC: 5.1 10*3/uL (ref 4.0–10.5)

## 2016-12-05 LAB — COMPREHENSIVE METABOLIC PANEL
ALT: <5 U/L — ABNORMAL LOW (ref 14–54)
AST: 8 U/L — ABNORMAL LOW (ref 15–41)
Albumin: 2.9 g/dL — ABNORMAL LOW (ref 3.5–5.0)
Alkaline Phosphatase: 58 U/L (ref 38–126)
Anion gap: 10 (ref 5–15)
BUN: 14 mg/dL (ref 6–20)
CHLORIDE: 106 mmol/L (ref 101–111)
CO2: 23 mmol/L (ref 22–32)
CREATININE: 0.91 mg/dL (ref 0.44–1.00)
Calcium: 9 mg/dL (ref 8.9–10.3)
GFR calc non Af Amer: >60 mL/min (ref >60)
Glucose, Bld: 105 mg/dL — ABNORMAL HIGH (ref 65–99)
Potassium: 4 mmol/L (ref 3.5–5.1)
SODIUM: 139 mmol/L (ref 135–145)
Total Bilirubin: 0.2 mg/dL — ABNORMAL LOW (ref 0.3–1.2)
Total Protein: 7.4 g/dL (ref 6.5–8.1)

## 2016-12-05 LAB — MAGNESIUM: Magnesium: 1.2 mg/dL — ABNORMAL LOW (ref 1.7–2.4)

## 2016-12-05 LAB — LIPASE, BLOOD: LIPASE: 22 U/L (ref 11–51)

## 2016-12-05 LAB — I-STAT CG4 LACTIC ACID, ED: Lactic Acid, Venous: 0.42 mmol/L — ABNORMAL LOW (ref 0.5–1.9)

## 2016-12-05 LAB — PROTIME-INR
INR: 1.08
PROTHROMBIN TIME: 14 s (ref 11.4–15.2)

## 2016-12-05 MED ORDER — FENTANYL CITRATE (PF) 100 MCG/2ML IJ SOLN
50.0000 ug | Freq: Once | INTRAMUSCULAR | Status: AC
  Administered 2016-12-05: 50 ug via INTRAVENOUS
  Filled 2016-12-05: qty 2

## 2016-12-05 MED ORDER — IOPAMIDOL (ISOVUE-370) INJECTION 76%
INTRAVENOUS | Status: AC
  Administered 2016-12-05: 100 mL
  Filled 2016-12-05: qty 100

## 2016-12-05 NOTE — ED Notes (Signed)
Bed: OE70 Expected date: 12/05/16 Expected time: 6:27 PM Means of arrival:  Comments: Non tramatic neck pain

## 2016-12-05 NOTE — ED Provider Notes (Signed)
[facility] DEPT Provider Note   CSN: [id number] Arrival date & time: 12/05/16  4098     History   Chief Complaint Chief Complaint  Patient presents with  . Neck Pain    HPI Pamela Hampton is a 76 y.o. female.  The history is provided by the patient, the spouse, a relative and medical records.  Neck Injury  This is a new problem. The current episode started 12 to 24 hours ago. The problem occurs constantly. The problem has not changed since onset.Pertinent negatives include no chest pain, no abdominal pain, no headaches and no shortness of breath. The symptoms are aggravated by twisting. Nothing relieves the symptoms. She has tried nothing for the symptoms. The treatment provided no relief.    Past Medical History:  Diagnosis Date  . Allergy    Rhinitis  . Anemia   . Arthritis   . Barrett's esophagus 07/19/2004  . Blood transfusion   . COPD (chronic obstructive pulmonary disease) (HCC)    wears 3 liters of o2 continious  . Depression   . Emphysema of lung ([place])   . GERD (gastroesophageal reflux disease)    Barrett's esophagus  . Hepatitis   . Hiatal hernia   . History of oxygen administration    oxygen concentrator @ 4 l/m nasally 24/ 7  . Hyperlipidemia   . Hypertension   . Impaired hearing   . Obesity   . Osteoporosis   . Pneumonia   . Seizures ([place])   . Sleep apnea   . Stroke ([place])   . Ulcer   . 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    Patient Active Problem List   Diagnosis Date Noted  . Nodule of right lung 04/11/2016  . Loss of weight 03/11/2016  . Stopped smoking with greater than 40 pack year history 03/11/2016  . Pressure ulcer stage II 11/29/2015  . Otalgia 03/20/2014  . Stage 4 very severe COPD by GOLD classification ([place]) 03/13/2014  . Chronic respiratory failure with hypoxia ([place]) 03/13/2014  . Personal history of arteriovenous malformation (AVM) 11/26/2011  . AVM (arteriovenous malformation) 11/26/2011  .  Stricture and stenosis of esophagus 10/20/2011  . BARRETTS ESOPHAGUS 06/04/2009  . Iron deficiency anemia 05/07/2009  . VITAMIN B12 DEFICIENCY 08/13/2007  . HYPERLIPIDEMIA 05/04/2006  . DEPRESSION 05/04/2006  . Essential hypertension 05/04/2006  . GERD 05/04/2006  . Osteoporosis 05/04/2006  . SEIZURE DISORDER 05/04/2006    Past Surgical History:  Procedure Laterality Date  . ABDOMINAL HYSTERECTOMY    . abdominal laproscopy surgery    . CHOLECYSTECTOMY    . ESOPHAGOGASTRODUODENOSCOPY (EGD) WITH PROPOFOL N/A 12/21/2014   Procedure: ESOPHAGOGASTRODUODENOSCOPY (EGD) WITH PROPOFOL;  Surgeon: Inda Castle, MD;  Location: [facility];  Service: Endoscopy;  Laterality: N/A;  . HEMORRHOID SURGERY    . JOINT REPLACEMENT    . ORIF HIP FRACTURE Right   . TOTAL HIP ARTHROPLASTY Right   . 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 Authorizing Provider  albuterol (PROVENTIL) (2.5 MG/3ML) 0.083% nebulizer solution INHALE THE CONTENTS OF 1 VIAL VIA  NEBULIZER 4 TIMES A DAY. 11/08/15  Yes Brand Males, MD  Aspirin-Acetaminophen (GOODYS BODY PAIN PO) Take 2 packets by mouth daily as needed (pain).   Yes [provider]  beclomethasone (QVAR) 80 MCG/ACT inhaler Inhale 2 puffs into the lungs 2 (two) times daily. 12/01/14  Yes Brand Males, MD  escitalopram (LEXAPRO) 10 MG tablet TAKE ONE TABLET BY MOUTH ONCE DAILY 10/28/16  Yes Marletta Lor, MD  ferrous sulfate 325 (65 FE) MG tablet Take 325 mg by mouth 2 (two) times daily. 10/30/16  Yes [provider]  fluticasone (FLONASE) 50 MCG/ACT nasal spray PLACE 2 SPRAYS INTO BOTH NOSTRILS DAILY. 07/06/15  Yes Marletta Lor, MD  ibandronate  (BONIVA) 150 MG tablet take 1 tablet by mouth in the morning on an empty stomach once monthly 10/02/16  Yes Marletta Lor, MD  ipratropium (ATROVENT) 0.02 % nebulizer solution INHALE THE CONTENTS OF 1 VIAL VIA NEBULIZER 4 TIMES A DAY. 10/17/16  Yes Brand Males, MD  losartan (COZAAR) 25 MG tablet TAKE 1 TABLET BY MOUTH DAILY. 09/10/16  Yes Marletta Lor, MD  omeprazole (PRILOSEC) 20 MG capsule TAKE 1 CAPSULE BY MOUTH ONCE DAILY. 02/18/16  Yes Brand Males, MD  OXYGEN Inhale 4 L/min into the lungs continuous.   Yes [provider]  polyethylene glycol powder (GLYCOLAX/MIRALAX) powder Take 17 g by mouth 2 (two) times daily as needed. Patient taking differently: Take 17 g by mouth daily as needed for mild constipation.  08/30/14  Yes Marletta Lor, MD  potassium chloride SA (K-DUR,KLOR-CON) 20 MEQ tablet TAKE ONE TABLET BY MOUTH ONCE DAILY 12/03/16  Yes Marletta Lor, MD  PROAIR HFA 108 [phone] Base) MCG/ACT inhaler inhale TWO PUFFS BY MOUTH EVERY 4 HOURS AS NEEDED for SHORTNESS OF BREATH 10/28/16  Yes Marletta Lor, MD  roflumilast (DALIRESP) 500 MCG TABS tablet Take 1 tablet (500 mcg total) by mouth daily. 12/04/15  Yes Brand Males, MD  rosuvastatin (CRESTOR) 10 MG tablet Take 10 mg by mouth daily. 12/03/16  Yes [provider]  traZODone (DESYREL) 100 MG tablet Take 1 tablet (100 mg total) by mouth at bedtime. 10/03/16  Yes Marletta Lor, MD  trimethoprim (TRIMPEX) 100 MG tablet Take 100 mg by mouth daily. 10/27/16  Yes [provider]  CRESTOR 20 MG tablet Take 1 tablet (20 mg total) by mouth every other day. Patient not taking: Reported on 12/05/2016 10/22/15   Marletta Lor, MD  Ferrous Sulfate Dried 200 (65 Fe) MG TABS 1 tablet by mouth twice daily Patient not taking: Reported on 12/05/2016 10/03/16   Marletta Lor, MD    Family History Family History  Problem Relation Age of Onset  . Heart disease Mother        MI  .  Diabetes Mother   . Uterine cancer Mother   . Anemia Mother   . Hypertension Mother   . Stomach cancer Father   . Esophageal cancer Father   . Hypertension Father   . Diabetes Sister   . Anemia Sister   . Esophageal cancer Brother   . Hypertension Brother   . Diabetes Son   . Diabetes Son   . Brain cancer Brother   . Lung cancer Sister   . Liver cancer Sister   . Colon cancer Neg Hx   . Rectal cancer Neg Hx     Social History Social History  Substance Use Topics  . Smoking status: Former Smoker  Packs/day: 2.00    Years: 30.00    Types: Cigarettes    Quit date: 07/28/2002  . Smokeless tobacco: Never Used  . Alcohol use No     Allergies   Patient has no known allergies.   Review of Systems Review of Systems  Constitutional: Negative for chills, diaphoresis, fatigue and fever.  HENT: Negative for congestion and rhinorrhea.   Eyes: Negative for photophobia and visual disturbance.  Respiratory: Negative for cough, chest tightness, shortness of breath, wheezing and stridor.   Cardiovascular: Negative for chest pain, palpitations and leg swelling.  Gastrointestinal: Negative for abdominal pain, constipation, diarrhea, nausea and vomiting.  Genitourinary: Negative for flank pain.  Musculoskeletal: Positive for neck pain and neck stiffness. Negative for back pain.  Skin: Negative for rash and wound.  Neurological: Negative for dizziness, facial asymmetry, weakness, light-headedness, numbness and headaches.  Psychiatric/Behavioral: Negative for agitation and confusion.  All other systems reviewed and are negative.    Physical Exam Updated Vital Signs BP 120/70   Pulse 74   Temp 98 F (36.7 C) (Oral)   Resp 18   SpO2 98%   Physical Exam  Constitutional: She appears well-developed and well-nourished. No distress.  HENT:  Head: Normocephalic and atraumatic.  Mouth/Throat: Oropharynx is clear and moist. No oropharyngeal exudate.  Eyes: Conjunctivae are normal.    Neck: Neck supple. Muscular tenderness present. No tracheal tenderness and no spinous process tenderness present. No neck rigidity. No tracheal deviation, no edema, no erythema and normal range of motion present.    Cardiovascular: Normal rate, regular rhythm and intact distal pulses.   No murmur heard. Pulmonary/Chest: Effort normal and breath sounds normal. No stridor. No respiratory distress. She has no wheezes. She has no rales. She exhibits no tenderness.  Abdominal: Soft. There is no tenderness.  Musculoskeletal: She exhibits tenderness. She exhibits no edema.  Neurological: She is alert.  Skin: Skin is warm and dry. Capillary refill takes less than 2 seconds. No rash noted. She is not diaphoretic.  Psychiatric: She has a normal mood and affect.  Nursing note and vitals reviewed.    ED Treatments / Results  Labs (all labs ordered are listed, but only abnormal results are displayed) Labs Reviewed  CBC WITH DIFFERENTIAL/PLATELET - Abnormal; Notable for the following:       Result Value   RBC 3.39 (*)    Hemoglobin 9.6 (*)    HCT 30.8 (*)    RDW 16.8 (*)    All other components within normal limits  COMPREHENSIVE METABOLIC PANEL - Abnormal; Notable for the following:    Glucose, Bld 105 (*)    Albumin 2.9 (*)    AST 8 (*)    ALT <5 (*)    Total Bilirubin 0.2 (*)    All other components within normal limits  URINALYSIS, ROUTINE W REFLEX MICROSCOPIC - Abnormal; Notable for the following:    APPearance HAZY (*)    Specific Gravity, Urine >1.046 (*)    Hgb urine dipstick SMALL (*)    Leukocytes, UA LARGE (*)    Bacteria, UA RARE (*)    Squamous Epithelial / LPF 0-5 (*)    All other components within normal limits  MAGNESIUM - Abnormal; Notable for the following:    Magnesium 1.2 (*)    All other components within normal limits  I-STAT CG4 LACTIC ACID, ED - Abnormal; Notable for the following:    Lactic Acid, Venous 0.42 (*)    All other components within normal limits  LIPASE, BLOOD  PROTIME-INR    EKG  EKG Interpretation None       Radiology Ct Angio Head W Or Wo Contrast  Result Date: 12/05/2016 CLINICAL DATA:  76 y/o  F; nontraumatic neck pain. EXAM: CT ANGIOGRAPHY HEAD AND NECK TECHNIQUE: Multidetector CT imaging of the head and neck was performed using the standard protocol during bolus administration of intravenous contrast. Multiplanar CT image reconstructions and MIPs were obtained to evaluate the vascular anatomy. Carotid stenosis measurements (when applicable) are obtained utilizing NASCET criteria, using the distal internal carotid diameter as the denominator. CONTRAST:  100 cc Isovue 370 COMPARISON:  03/18/2007 CT head FINDINGS: CT HEAD FINDINGS Brain: No evidence of acute infarction, hemorrhage, hydrocephalus, extra-axial collection or mass lesion/mass effect. Stable chronic lacunar infarct within the left anterior lentiform nucleus and periventricular white matter of left frontal lobe. Mild chronic microvascular ischemic changes and parenchymal volume loss of the brain. Vascular: Extensive calcific atherosclerosis of carotid siphons. Skull: Normal. Negative for fracture or focal lesion. Sinuses: Mild ethmoid sinus mucosal thickening. Right-greater-than-left mastoid effusions. Orbits: No acute finding. Review of the MIP images confirms the above findings CTA NECK FINDINGS Aortic arch: Standard branching. Imaged portion shows no evidence of aneurysm or dissection. Moderate calcific atherosclerosis of the aortic arch. Calcified plaque of left subclavian artery origin with 30% stenosis. Right carotid system: No evidence of dissection, stenosis (50% or greater) or occlusion. Mixed plaque of common carotid artery and of the carotid bifurcation with minimal less than 30% stenosis of proximal ICA. Left carotid system: Motion degraded left common carotid artery origin, question mild stenosis. Mixed plaque of common carotid artery and of the carotid bifurcation  with mild 40% proximal ICA stenosis. Vertebral arteries: Mild left dominance. No evidence of dissection, stenosis (50% or greater) or occlusion. Calcified plaque of left distal V1 segment with mild stenosis. Skeleton: Mild cervical spondylosis for age greatest at the C5 through C7 levels. No high-grade bony canal stenosis. Other neck: Subcentimeter nodules in the bilateral lobes of the thyroid gland. Upper chest: Moderate pulmonary emphysema. Review of the MIP images confirms the above findings CTA HEAD FINDINGS Anterior circulation: No significant stenosis, proximal occlusion, aneurysm, or vascular malformation. Moderate calcific atherosclerosis of bilateral cavernous and paraclinoid segments of the internal carotid arteries with areas of mild stenosis. Posterior circulation: No significant stenosis, proximal occlusion, aneurysm, or vascular malformation. Venous sinuses: As permitted by contrast timing, patent. Anatomic variants: Patent anterior communicating artery and bilateral posterior communicating arteries. Delayed phase: No abnormal intracranial enhancement. Review of the MIP images confirms the above findings IMPRESSION: 1. No acute intracranial abnormality identified. No abnormal enhancement of the brain. 2. Mild chronic microvascular ischemic changes and mild parenchymal volume loss of the brain. 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 3. Right-greater-than-left mastoid air cell effusions. 4. Patent carotid and vertebral arteries. No dissection, aneurysm, or significant stenosis is identified. 5. Mixed plaque of bilateral carotid bifurcations with proximal ICA 30-40% mild stenosis. 6. No mass or inflammatory process of the neck identified. 7. Mild cervical spine spondylosis for age greatest at the C5 through C7 levels. No high-grade bony canal stenosis. 8. Patent circle of Willis. No large vessel occlusion, aneurysm, or significant stenosis is identified. Electronically Signed   By: Kristine Garbe M.D.   On: 12/05/2016 23:38   Dg Chest 2 View  Result Date: 12/05/2016 CLINICAL DATA:  Cough.  Neck pain. EXAM: CHEST  2 VIEW COMPARISON:  Chest CT 10/09/2016 FINDINGS: Hyperinflation and emphysema. The right lower  lobe pulmonary nodule on CT is not well seen radiographically. Unchanged heart size and mediastinal contours with large hiatal hernia. No pulmonary edema, pleural effusion, focal airspace disease or pneumothorax. The bones are under mineralized. IMPRESSION: Emphysema without acute abnormality. Known right lower lobe pulmonary nodule not well seen radiographically. Electronically Signed   By: Jeb Levering M.D.   On: 12/05/2016 21:51   Ct Angio Neck W Or Wo Contrast  Result Date: 12/05/2016 CLINICAL DATA:  76 y/o  F; nontraumatic neck pain. EXAM: CT ANGIOGRAPHY HEAD AND NECK TECHNIQUE: Multidetector CT imaging of the head and neck was performed using the standard protocol during bolus administration of intravenous contrast. Multiplanar CT image reconstructions and MIPs were obtained to evaluate the vascular anatomy. Carotid stenosis measurements (when applicable) are obtained utilizing NASCET criteria, using the distal internal carotid diameter as the denominator. CONTRAST:  100 cc Isovue 370 COMPARISON:  03/18/2007 CT head FINDINGS: CT HEAD FINDINGS Brain: No evidence of acute infarction, hemorrhage, hydrocephalus, extra-axial collection or mass lesion/mass effect. Stable chronic lacunar infarct within the left anterior lentiform nucleus and periventricular white matter of left frontal lobe. Mild chronic microvascular ischemic changes and parenchymal volume loss of the brain. Vascular: Extensive calcific atherosclerosis of carotid siphons. Skull: Normal. Negative for fracture or focal lesion. Sinuses: Mild ethmoid sinus mucosal thickening. Right-greater-than-left mastoid effusions. Orbits: No acute finding. Review of the MIP images confirms the above findings CTA NECK FINDINGS  Aortic arch: Standard branching. Imaged portion shows no evidence of aneurysm or dissection. Moderate calcific atherosclerosis of the aortic arch. Calcified plaque of left subclavian artery origin with 30% stenosis. Right carotid system: No evidence of dissection, stenosis (50% or greater) or occlusion. Mixed plaque of common carotid artery and of the carotid bifurcation with minimal less than 30% stenosis of proximal ICA. Left carotid system: Motion degraded left common carotid artery origin, question mild stenosis. Mixed plaque of common carotid artery and of the carotid bifurcation with mild 40% proximal ICA stenosis. Vertebral arteries: Mild left dominance. No evidence of dissection, stenosis (50% or greater) or occlusion. Calcified plaque of left distal V1 segment with mild stenosis. Skeleton: Mild cervical spondylosis for age greatest at the C5 through C7 levels. No high-grade bony canal stenosis. Other neck: Subcentimeter nodules in the bilateral lobes of the thyroid gland. Upper chest: Moderate pulmonary emphysema. Review of the MIP images confirms the above findings CTA HEAD FINDINGS Anterior circulation: No significant stenosis, proximal occlusion, aneurysm, or vascular malformation. Moderate calcific atherosclerosis of bilateral cavernous and paraclinoid segments of the internal carotid arteries with areas of mild stenosis. Posterior circulation: No significant stenosis, proximal occlusion, aneurysm, or vascular malformation. Venous sinuses: As permitted by contrast timing, patent. Anatomic variants: Patent anterior communicating artery and bilateral posterior communicating arteries. Delayed phase: No abnormal intracranial enhancement. Review of the MIP images confirms the above findings IMPRESSION: 1. No acute intracranial abnormality identified. No abnormal enhancement of the brain. 2. Mild chronic microvascular ischemic changes and mild parenchymal volume loss of the brain. Chronic lacunar infarct of  left anterior basal ganglia. 3. Right-greater-than-left mastoid air cell effusions. 4. Patent carotid and vertebral arteries. No dissection, aneurysm, or significant stenosis is identified. 5. Mixed plaque of bilateral carotid bifurcations with proximal ICA 30-40% mild stenosis. 6. No mass or inflammatory process of the neck identified. 7. Mild cervical spine spondylosis for age greatest at the C5 through C7 levels. No high-grade bony canal stenosis. 8. Patent circle of Willis. No large vessel occlusion, aneurysm, or significant stenosis is identified. Electronically  Signed   By: Kristine Garbe M.D.   On: 12/05/2016 23:38    Procedures Procedures (including critical care time)  Medications Ordered in ED Medications  fentaNYL (SUBLIMAZE) injection 50 mcg (50 mcg Intravenous Given 12/05/16 2148)  iopamidol (ISOVUE-370) 76 % injection (100 mLs  Contrast Given 12/05/16 2245)  magnesium chloride (SLOW-MAG) 64 MG SR tablet 64 mg (64 mg Oral Given 12/06/16 0103)     Initial Impression / Assessment and Plan / ED Course  I have reviewed the triage vital signs and the nursing notes.  Pertinent labs & imaging results that were available during my care of the patient were reviewed by me and considered in my medical decision making (see chart for details).     Pamela Hampton is a 76 y.o. female With a past medical history significant for recent urinary tract infection, hypertension, hyperlipidemia, GERD, seizures, COPD, and prior stroke who presents with neck pain. Patient does report a history of neck spasms and pain years ago. Patient denies any dramatic injuries and reports waking up with neck pain this morning. She describes it as severe pain on both sides of her neck. She has no numbness, tingling, or weakness of extremities. No nausea, vomiting, or vision changes. She does report occipital headache with this. She describes pain as 10 out of 10 in severity. 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  History and exam are seen above. Patient's neck was tender in the paraspinal areas. Less tenderness in the midline. Lungs clear and chest nontender. No focal neurologic deficits.  Given the patient's sudden onset of neck pain and headache, patient will have imaging to look for pathologic fracture with her cancer history as well as vertebral artery dissection. Patient given medications to help with symptoms.  Patient's workup showed no evidence of vertebral artery dissection, stroke, or other cause of neck pain. No evidence of bony fracture or injury. Chronic changes seen. All findings on CT imaging relate to the patient. Patient instructed to follow up with PCP for carotid stenosis and chronic brady infarcts.  Patient reassured after workup. Patient's neck was feeling better after medications. Suspect muscle spasm given exam and tenderness. Magnesium was low, feel this may be related. Patient given magnesium supplementation. No urinary sx reported. Doubt UTI. No Fever or chills. No evidence of Meningitis.   Patient instructed to follow up with her PCP for further management. Patient given return precautions for any new or worsened symptoms.  Patient and family had no other questions or concerns and patient discharged for further management of likely musculoskeletal neck pain.      Final Clinical Impressions(s) / ED Diagnoses   Final diagnoses:  Neck pain  Headache  Acute strain of neck muscle, initial encounter    New Prescriptions Discharge Medication List as of 12/06/2016 12:53 AM    START taking these medications   Details  cyclobenzaprine (FLEXERIL) 5 MG tablet Take 1 tablet (5 mg total) by mouth 2 (two) times daily as needed for muscle spasms., Starting Sat 12/06/2016, Print       Clinical Impression: 1. Acute strain of neck muscle, initial encounter   2. Neck pain   3. Headache   4. Pain     Disposition: Discharge  Condition:  Good  I have discussed the results, Dx and Tx plan with the pt(& family if present). He/she/they expressed understanding and agree(s) with the plan. Discharge instructions discussed at great length. Strict return precautions  discussed and pt &/or family have verbalized understanding of the instructions. No further questions at time of discharge.    Discharge Medication List as of 12/06/2016 12:53 AM    START taking these medications   Details  cyclobenzaprine (FLEXERIL) 5 MG tablet Take 1 tablet (5 mg total) by mouth 2 (two) times daily as needed for muscle spasms., Starting Sat 12/06/2016, Print        Follow Up: Marletta Lor, [address] [place] [address] [phone]  Schedule an appointment as soon as possible for a visit    [facility] [place] [id number] [place] [phone]  If symptoms worsen     Tegeler, Gwenyth Allegra, MD 12/06/16 1400

## 2016-12-05 NOTE — ED Triage Notes (Signed)
Per [facility] pt c/o non traumatic neck pain onset of today. Pt sleeps in chair and woke up with kinked feeling in neck. Pt on 4L [place] continuous due to COPD. Pt also on last day of antibiotics for UTI. Pt non ambulatory.

## 2016-12-06 DIAGNOSIS — S161XXA Strain of muscle, fascia and tendon at neck level, initial encounter: Secondary | ICD-10-CM | POA: Diagnosis not present

## 2016-12-06 LAB — URINALYSIS, ROUTINE W REFLEX MICROSCOPIC
Bilirubin Urine: NEGATIVE
Glucose, UA: NEGATIVE mg/dL
KETONES UR: NEGATIVE mg/dL
Nitrite: NEGATIVE
PH: 5 (ref 5.0–8.0)
PROTEIN: NEGATIVE mg/dL
Specific Gravity, Urine: >1.046 — ABNORMAL HIGH (ref 1.005–1.030)

## 2016-12-06 MED ORDER — CYCLOBENZAPRINE HCL 5 MG PO TABS: 5.0000 mg | ORAL_TABLET | Freq: Two times a day (BID) | ORAL | 0 refills | Status: DC | PRN

## 2016-12-06 MED ORDER — MAGNESIUM CHLORIDE 64 MG PO TBEC
1.0000 | DELAYED_RELEASE_TABLET | Freq: Once | ORAL | Status: AC
  Administered 2016-12-06: 64 mg via ORAL
  Filled 2016-12-06: qty 1

## 2016-12-06 NOTE — Discharge Instructions (Signed)
Please take your muscle relaxant to help with her neck pain. That should help with the muscle spasms. Please schedule a follow-up appointment with a primary care physician for further management. If any symptoms change or worsen, please return to the nearest emergency department.

## 2016-12-08 ENCOUNTER — Other Ambulatory Visit: Payer: Self-pay | Admitting: Internal Medicine

## 2016-12-09 DIAGNOSIS — J449 Chronic obstructive pulmonary disease, unspecified: Secondary | ICD-10-CM | POA: Diagnosis not present

## 2016-12-12 ENCOUNTER — Encounter: Payer: Self-pay | Admitting: Internal Medicine

## 2016-12-12 ENCOUNTER — Ambulatory Visit (INDEPENDENT_AMBULATORY_CARE_PROVIDER_SITE_OTHER): Payer: Medicare HMO | Admitting: Internal Medicine

## 2016-12-12 VITALS — BP 52/36 | HR 67 | Temp 97.5°F | Resp 22 | Ht 63.0 in

## 2016-12-12 DIAGNOSIS — I10 Essential (primary) hypertension: Secondary | ICD-10-CM

## 2016-12-12 DIAGNOSIS — D500 Iron deficiency anemia secondary to blood loss (chronic): Secondary | ICD-10-CM

## 2016-12-12 DIAGNOSIS — J449 Chronic obstructive pulmonary disease, unspecified: Secondary | ICD-10-CM | POA: Diagnosis not present

## 2016-12-12 DIAGNOSIS — Q2730 Arteriovenous malformation, site unspecified: Secondary | ICD-10-CM

## 2016-12-12 MED ORDER — ONDANSETRON HCL 4 MG PO TABS: 4.0000 mg | ORAL_TABLET | Freq: Three times a day (TID) | ORAL | 0 refills | Status: AC | PRN

## 2016-12-12 NOTE — Patient Instructions (Signed)
Drink as much fluid as you  can tolerate over the next few days  Increase omeprazole to 1 tablet twice daily  Take a medication for nausea as prescribed  Discontinued losartan at this time  Follow-up 5 days  Evaluation in the emergency department if any worsening

## 2016-12-12 NOTE — Progress Notes (Signed)
Subjective:    Patient ID: Pamela Hampton, female    DOB: April 26, 1941, 76 y.o.   MRN: [id number]  HPI  76 year old patient who is seen today in follow-up She was seen 1 week ago in the ED.  ED note and laboratory studies reviewed She has been on multiple antibiotics for recurrent urinary tract infections.  Apparently she is back on trimethoprim once daily as suppressive therapy Complaints today include shortness of breath and nausea  She is scheduled for pulmonary  follow-up soon  She has advanced COPD as well as hypertension.  For the past several days she has been eating and drinking poorly due to nausea.  No active vomiting.  Past Medical History:  Diagnosis Date  . Allergy    Rhinitis  . Anemia   . Arthritis   . Barrett's esophagus 07/19/2004  . Blood transfusion   . COPD (chronic obstructive pulmonary disease) (HCC)    wears 3 liters of o2 continious  . Depression   . Emphysema of lung ([place])   . GERD (gastroesophageal reflux disease)    Barrett's esophagus  . Hepatitis   . Hiatal hernia   . History of oxygen administration    oxygen concentrator @ 4 l/m nasally 24/ 7  . Hyperlipidemia   . Hypertension   . Impaired hearing   . Obesity   . Osteoporosis   . Pneumonia   . Seizures ([place])   . Sleep apnea   . Stroke ([place])   . Ulcer   . Vaginal cancer ([place])    radiation implant '97. radiation. chemo.     Social History   Social History  . Marital status: Divorced    Spouse name: N/A  . Number of children: 4  . Years of education: N/A   Occupational History  . disaled Retired   Social History Main Topics  . Smoking status: Former Smoker    Packs/day: 2.00    Years: 30.00    Types: Cigarettes    Quit date: 07/28/2002  . Smokeless tobacco: Never Used  . Alcohol use No  . Drug use: No  . Sexual activity: Not Currently   Other Topics Concern  . Not on file   Social History Narrative  . No narrative on file    Past Surgical History:  Procedure  Laterality Date  . ABDOMINAL HYSTERECTOMY    . abdominal laproscopy surgery    . CHOLECYSTECTOMY    . ESOPHAGOGASTRODUODENOSCOPY (EGD) WITH PROPOFOL N/A 12/21/2014   Procedure: ESOPHAGOGASTRODUODENOSCOPY (EGD) WITH PROPOFOL;  Surgeon: Inda Castle, MD;  Location: [facility];  Service: Endoscopy;  Laterality: N/A;  . HEMORRHOID SURGERY    . JOINT REPLACEMENT    . ORIF HIP FRACTURE Right   . TOTAL HIP ARTHROPLASTY Right   . 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 Heart disease Mother        MI  . Diabetes Mother   . Uterine cancer Mother   . Anemia Mother   . Hypertension Mother   . Stomach cancer Father   . Esophageal cancer Father   . Hypertension Father   . Diabetes Sister   . Anemia Sister   . Esophageal cancer Brother   . Hypertension Brother   . Diabetes Son   . Diabetes Son   . Brain cancer Brother   . Lung cancer Sister   . Liver cancer  Sister   . Colon cancer Neg Hx   . Rectal cancer Neg Hx     No Known Allergies  Current Outpatient Prescriptions on File Prior to Visit  Medication Sig Dispense Refill  . albuterol (PROVENTIL) (2.5 MG/3ML) 0.083% nebulizer solution INHALE THE CONTENTS OF 1 VIAL VIA NEBULIZER 4 TIMES A DAY. 360 mL 2  . Aspirin-Acetaminophen (GOODYS BODY PAIN PO) Take 2 packets by mouth daily as needed (pain).    Marland Kitchen beclomethasone (QVAR) 80 MCG/ACT inhaler Inhale 2 puffs into the lungs 2 (two) times daily. 8.7 g 6  . CRESTOR 20 MG tablet Take 1 tablet (20 mg total) by mouth every other day. 90 tablet 1  . cyclobenzaprine (FLEXERIL) 5 MG tablet Take 1 tablet (5 mg total) by mouth 2 (two) times daily as needed for muscle spasms. 14 tablet 0  . DALIRESP 500 MCG TABS tablet TAKE ONE TABLET BY MOUTH ONCE DAILY 30 tablet 3  . escitalopram (LEXAPRO) 10 MG tablet TAKE ONE TABLET BY MOUTH ONCE DAILY 30 tablet 5  . ferrous sulfate 325 (65 FE) MG  tablet Take 325 mg by mouth 2 (two) times daily.  6  . Ferrous Sulfate Dried 200 (65 Fe) MG TABS 1 tablet by mouth twice daily 30 tablet 6  . fluticasone (FLONASE) 50 MCG/ACT nasal spray PLACE 2 SPRAYS INTO BOTH NOSTRILS DAILY. 16 g 5  . ibandronate (BONIVA) 150 MG tablet take 1 tablet by mouth in the morning on an empty stomach once monthly 1 tablet 5  . ipratropium (ATROVENT) 0.02 % nebulizer solution INHALE THE CONTENTS OF 1 VIAL VIA NEBULIZER 4 TIMES A DAY. 300 mL 7  . losartan (COZAAR) 25 MG tablet TAKE ONE TABLET BY MOUTH ONCE DAILY 90 tablet 1  . omeprazole (PRILOSEC) 20 MG capsule TAKE ONE CAPSULE BY MOUTH ONCE DAILY 90 capsule 3  . OXYGEN Inhale 4 L/min into the lungs continuous.    . polyethylene glycol powder (GLYCOLAX/MIRALAX) powder Take 17 g by mouth 2 (two) times daily as needed. (Patient taking differently: Take 17 g by mouth daily as needed for mild constipation. ) 3350 g 1  . potassium chloride SA (K-DUR,KLOR-CON) 20 MEQ tablet TAKE ONE TABLET BY MOUTH ONCE DAILY 30 tablet 0  . PROAIR HFA 108 (90 Base) MCG/ACT inhaler inhale TWO PUFFS BY MOUTH EVERY 4 HOURS AS NEEDED for SHORTNESS OF BREATH 8.5 g 5  . rosuvastatin (CRESTOR) 10 MG tablet TAKE ONE TABLET BY MOUTH ONCE DAILY 112 tablet 1  . traZODone (DESYREL) 100 MG tablet Take 1 tablet (100 mg total) by mouth at bedtime. 90 tablet 1  . trimethoprim (TRIMPEX) 100 MG tablet Take 100 mg by mouth daily.  11   No current facility-administered medications on file prior to visit.     BP (!) 52/36 (BP Location: Left Arm, Patient Position: Sitting, Cuff Size: Normal)   Pulse 67   Temp 97.5 F (36.4 C) (Oral)   Resp (!) 22   Ht 5\' 3"  (1.6 m)   SpO2 98%     Review of Systems  Constitutional: Positive for activity change, appetite change and fatigue.  HENT: Negative for congestion, dental problem, hearing loss, rhinorrhea, sinus pressure, sore throat and tinnitus.   Eyes: Negative for pain, discharge and visual disturbance.    Respiratory: Negative for cough and shortness of breath.   Cardiovascular: Negative for chest pain, palpitations and leg swelling.  Gastrointestinal: Positive for nausea. Negative for abdominal distention, abdominal pain, blood in stool, constipation, diarrhea  and vomiting.  Genitourinary: Negative for difficulty urinating, dysuria, flank pain, frequency, hematuria, pelvic pain, urgency, vaginal bleeding, vaginal discharge and vaginal pain.  Musculoskeletal: Negative for arthralgias, gait problem and joint swelling.  Skin: Negative for rash.  Neurological: Positive for weakness. Negative for dizziness, syncope, speech difficulty, numbness and headaches.  Hematological: Negative for adenopathy.  Psychiatric/Behavioral: Negative for agitation, behavioral problems and dysphoric mood. The patient is not nervous/anxious.        Objective:   Physical Exam  Constitutional: She is oriented to person, place, and time. She appears well-developed and well-nourished.  Wheelchair bound Appears weak Nauseated Blood pressure 90/60 O2 saturation 98% on O2  HENT:  Head: Normocephalic.  Right Ear: External ear normal.  Left Ear: External ear normal.  Mouth/Throat: Oropharynx is clear and moist.  Eyes: Conjunctivae and EOM are normal. Pupils are equal, round, and reactive to light.  Neck: Normal range of motion. Neck supple. No thyromegaly present.  Cardiovascular: Normal rate, regular rhythm, normal heart sounds and intact distal pulses.   Pulmonary/Chest: Effort normal.  Rare scattered rhonchi  Abdominal: Soft. Bowel sounds are normal. She exhibits no mass. There is no tenderness.  Musculoskeletal: Normal range of motion.  Lymphadenopathy:    She has no cervical adenopathy.  Neurological: She is alert and oriented to person, place, and time.  Skin: Skin is warm and dry. No rash noted.  Psychiatric: She has a normal mood and affect. Her behavior is normal.          Assessment & Plan:    Nausea.  Will treat symptomatically.  Will increase omeprazole to twice a day Hypotension.  Probably secondary to volume repletion.  Will place.  Losartan on hold Advanced COPD, pulmonary follow-up as scheduled  We'll attempt to force fluids Follow-up next week.  ED referral if any clinical worsening  Nyoka Cowden

## 2016-12-15 ENCOUNTER — Telehealth: Payer: Self-pay | Admitting: Internal Medicine

## 2016-12-15 MED ORDER — AZITHROMYCIN 250 MG PO TABS: ORAL_TABLET | ORAL | 0 refills | Status: DC

## 2016-12-15 MED ORDER — PREDNISONE 10 MG PO TABS: ORAL_TABLET | ORAL | 0 refills | Status: DC

## 2016-12-15 NOTE — Telephone Encounter (Signed)
[phone] daughter calling back

## 2016-12-15 NOTE — Telephone Encounter (Signed)
Spoke with pt and informed her PM's message. She agreed to the medication being called in. Her rx was sent to her pharmacy of choice. She had no further questions. Nothing further is needed.

## 2016-12-15 NOTE — Telephone Encounter (Signed)
LMTCB x 1 

## 2016-12-15 NOTE — Telephone Encounter (Signed)
Please advise Dr Vaughan Browner as Dr Chase Caller is not available. Thanks.

## 2016-12-15 NOTE — Telephone Encounter (Signed)
[facility] [address] is who they use [phone] pt calling back

## 2016-12-15 NOTE — Telephone Encounter (Signed)
[id number] x1 

## 2016-12-15 NOTE — Telephone Encounter (Signed)
Call in z pack and pred taper starting at 40 mg. Reduce dose by 10 mg every 2 days

## 2016-12-15 NOTE — Telephone Encounter (Signed)
Pt having COPD flare - thick mucus, chest congestion, SOB and chest tx x 3 days Yellow mucus production when she is able to move the mucus.  Pt states that the humid weather caused her symptoms to flare.  Pt not taking any OTC meds for symptoms.  Using Albuterol as directed.   Please advise Dr Chase Caller. Thanks.

## 2016-12-17 ENCOUNTER — Encounter: Payer: Self-pay | Admitting: Internal Medicine

## 2016-12-17 ENCOUNTER — Ambulatory Visit (INDEPENDENT_AMBULATORY_CARE_PROVIDER_SITE_OTHER): Payer: Medicare HMO | Admitting: Internal Medicine

## 2016-12-17 VITALS — BP 120/84 | HR 68 | Temp 97.9°F | Wt 138.8 lb

## 2016-12-17 DIAGNOSIS — Q2730 Arteriovenous malformation, site unspecified: Secondary | ICD-10-CM

## 2016-12-17 DIAGNOSIS — I10 Essential (primary) hypertension: Secondary | ICD-10-CM | POA: Diagnosis not present

## 2016-12-17 DIAGNOSIS — J449 Chronic obstructive pulmonary disease, unspecified: Secondary | ICD-10-CM

## 2016-12-17 DIAGNOSIS — K2270 Barrett's esophagus without dysplasia: Secondary | ICD-10-CM

## 2016-12-17 NOTE — Patient Instructions (Addendum)
WE NOW OFFER   [facility] FAST TRACK!!!  SAME DAY Appointments for ACUTE CARE  Such as: Sprains, Injuries, cuts, abrasions, rashes, muscle pain, joint pain, back pain Colds, flu, sore throats, headache, allergies, cough, fever  Ear pain, sinus and eye infections Abdominal pain, nausea, vomiting, diarrhea, upset stomach Animal/insect bites  3 Easy Ways to Schedule: Walk-In Scheduling Call in scheduling Mychart Sign-up: [email or web address]   Advance diet as tolerated Pulmonary medicine follow-up as scheduled  Return in 4 months for follow-up

## 2016-12-18 ENCOUNTER — Encounter: Payer: Self-pay | Admitting: Internal Medicine

## 2016-12-18 DIAGNOSIS — J449 Chronic obstructive pulmonary disease, unspecified: Secondary | ICD-10-CM | POA: Diagnosis not present

## 2016-12-18 NOTE — Progress Notes (Signed)
   Subjective:    Patient ID: Pamela Hampton, female    DOB: March 03, 1941, 76 y.o.   MRN: [id number]  HPI 76 year old patient who is seen today in follow-up.  The patient was seen last week after a recent ED visit and was hypotensive at that time.  She had been ill with nausea and with decreased by mouth intake.  She had completed multiple antibiotics for an apparent UTI. Last week she was quite weak with nausea and shortness of breath. Losartan was placed on hold   Review of Systems  Constitutional: Positive for activity change, appetite change and fatigue.  HENT: Negative for congestion, dental problem, hearing loss, rhinorrhea, sinus pressure, sore throat and tinnitus.   Eyes: Negative for pain, discharge and visual disturbance.  Respiratory: Negative for cough and shortness of breath.   Cardiovascular: Negative for chest pain, palpitations and leg swelling.  Gastrointestinal: Positive for nausea. Negative for abdominal distention, abdominal pain, blood in stool, constipation, diarrhea and vomiting.  Genitourinary: Negative for difficulty urinating, dysuria, flank pain, frequency, hematuria, pelvic pain, urgency, vaginal bleeding, vaginal discharge and vaginal pain.  Musculoskeletal: Negative for arthralgias, gait problem and joint swelling.  Skin: Negative for rash.  Neurological: Positive for weakness. Negative for dizziness, syncope, speech difficulty, numbness and headaches.  Hematological: Negative for adenopathy.  Psychiatric/Behavioral: Negative for agitation, behavioral problems and dysphoric mood. The patient is not nervous/anxious.        Objective:   Physical Exam  Constitutional: She is oriented to person, place, and time. She appears well-developed and well-nourished.  Blood pressure 120/82blood pressure 120/82  HENT:  Head: Normocephalic.  Right Ear: External ear normal.  Left Ear: External ear normal.  Mouth/Throat: Oropharynx is clear and moist.  Eyes: Conjunctivae  and EOM are normal. Pupils are equal, round, and reactive to light.  Neck: Normal range of motion. Neck supple. No thyromegaly present.  Cardiovascular: Normal rate, regular rhythm, normal heart sounds and intact distal pulses.   Pulmonary/Chest: Effort normal and breath sounds normal.  Few scattered rhonchi Nasal cannula O2 in place O2 saturation 98 Pulse 70  Abdominal: Soft. Bowel sounds are normal. She exhibits no mass. There is no tenderness.  Musculoskeletal: Normal range of motion.  Lymphadenopathy:    She has no cervical adenopathy.  Neurological: She is alert and oriented to person, place, and time.  Skin: Skin is warm and dry. No rash noted.  Psychiatric: She has a normal mood and affect. Her behavior is normal.          Assessment & Plan:   Hypotension, resolved.  We'll continue to hold losartan Advanced COPD.  Pulmonary follow-up May 31.  No change in pulmonary maintenance medication Nausea improved.  Patient has advanced diet and seen to be improving.  Has not required any medicines for nausea  Primary follow-up as scheduled Return here 3-4 months or as needed  [employer]

## 2016-12-25 ENCOUNTER — Encounter: Payer: Self-pay | Admitting: Internal Medicine

## 2016-12-25 ENCOUNTER — Other Ambulatory Visit (INDEPENDENT_AMBULATORY_CARE_PROVIDER_SITE_OTHER): Payer: Medicare HMO

## 2016-12-25 ENCOUNTER — Emergency Department (HOSPITAL_COMMUNITY): Payer: Medicare HMO

## 2016-12-25 ENCOUNTER — Encounter: Payer: Self-pay | Admitting: Family Medicine

## 2016-12-25 ENCOUNTER — Encounter (HOSPITAL_COMMUNITY): Payer: Self-pay | Admitting: Family Medicine

## 2016-12-25 ENCOUNTER — Telehealth: Payer: Self-pay | Admitting: Internal Medicine

## 2016-12-25 ENCOUNTER — Ambulatory Visit (INDEPENDENT_AMBULATORY_CARE_PROVIDER_SITE_OTHER): Payer: Medicare HMO | Admitting: Internal Medicine

## 2016-12-25 ENCOUNTER — Inpatient Hospital Stay (HOSPITAL_COMMUNITY)
Admission: EM | Admit: 2016-12-25 | Discharge: 2017-01-25 | DRG: 871 | Disposition: E | Payer: Medicare HMO | Attending: Family Medicine | Admitting: Family Medicine

## 2016-12-25 ENCOUNTER — Ambulatory Visit (INDEPENDENT_AMBULATORY_CARE_PROVIDER_SITE_OTHER): Payer: Medicare HMO | Admitting: Family Medicine

## 2016-12-25 VITALS — BP 86/62 | HR 82 | Wt 130.0 lb

## 2016-12-25 DIAGNOSIS — E875 Hyperkalemia: Secondary | ICD-10-CM | POA: Diagnosis present

## 2016-12-25 DIAGNOSIS — E43 Unspecified severe protein-calorie malnutrition: Secondary | ICD-10-CM | POA: Diagnosis not present

## 2016-12-25 DIAGNOSIS — Z9981 Dependence on supplemental oxygen: Secondary | ICD-10-CM

## 2016-12-25 DIAGNOSIS — K529 Noninfective gastroenteritis and colitis, unspecified: Secondary | ICD-10-CM | POA: Diagnosis not present

## 2016-12-25 DIAGNOSIS — R404 Transient alteration of awareness: Secondary | ICD-10-CM | POA: Diagnosis not present

## 2016-12-25 DIAGNOSIS — E669 Obesity, unspecified: Secondary | ICD-10-CM | POA: Diagnosis present

## 2016-12-25 DIAGNOSIS — J9611 Chronic respiratory failure with hypoxia: Secondary | ICD-10-CM | POA: Diagnosis not present

## 2016-12-25 DIAGNOSIS — D509 Iron deficiency anemia, unspecified: Secondary | ICD-10-CM | POA: Diagnosis present

## 2016-12-25 DIAGNOSIS — E861 Hypovolemia: Secondary | ICD-10-CM | POA: Diagnosis present

## 2016-12-25 DIAGNOSIS — Z8701 Personal history of pneumonia (recurrent): Secondary | ICD-10-CM

## 2016-12-25 DIAGNOSIS — R109 Unspecified abdominal pain: Secondary | ICD-10-CM | POA: Diagnosis present

## 2016-12-25 DIAGNOSIS — J9690 Respiratory failure, unspecified, unspecified whether with hypoxia or hypercapnia: Secondary | ICD-10-CM | POA: Diagnosis not present

## 2016-12-25 DIAGNOSIS — Z87440 Personal history of urinary (tract) infections: Secondary | ICD-10-CM

## 2016-12-25 DIAGNOSIS — R011 Cardiac murmur, unspecified: Secondary | ICD-10-CM | POA: Diagnosis present

## 2016-12-25 DIAGNOSIS — R112 Nausea with vomiting, unspecified: Secondary | ICD-10-CM | POA: Diagnosis not present

## 2016-12-25 DIAGNOSIS — Z6823 Body mass index (BMI) 23.0-23.9, adult: Secondary | ICD-10-CM

## 2016-12-25 DIAGNOSIS — G4730 Sleep apnea, unspecified: Secondary | ICD-10-CM | POA: Diagnosis present

## 2016-12-25 DIAGNOSIS — R6521 Severe sepsis with septic shock: Secondary | ICD-10-CM | POA: Diagnosis not present

## 2016-12-25 DIAGNOSIS — F32A Depression, unspecified: Secondary | ICD-10-CM | POA: Diagnosis present

## 2016-12-25 DIAGNOSIS — R52 Pain, unspecified: Secondary | ICD-10-CM

## 2016-12-25 DIAGNOSIS — M549 Dorsalgia, unspecified: Secondary | ICD-10-CM | POA: Diagnosis present

## 2016-12-25 DIAGNOSIS — E785 Hyperlipidemia, unspecified: Secondary | ICD-10-CM | POA: Diagnosis not present

## 2016-12-25 DIAGNOSIS — Z66 Do not resuscitate: Secondary | ICD-10-CM | POA: Diagnosis present

## 2016-12-25 DIAGNOSIS — K759 Inflammatory liver disease, unspecified: Secondary | ICD-10-CM | POA: Diagnosis present

## 2016-12-25 DIAGNOSIS — Z90710 Acquired absence of both cervix and uterus: Secondary | ICD-10-CM

## 2016-12-25 DIAGNOSIS — J449 Chronic obstructive pulmonary disease, unspecified: Secondary | ICD-10-CM | POA: Diagnosis present

## 2016-12-25 DIAGNOSIS — F329 Major depressive disorder, single episode, unspecified: Secondary | ICD-10-CM | POA: Diagnosis present

## 2016-12-25 DIAGNOSIS — D500 Iron deficiency anemia secondary to blood loss (chronic): Secondary | ICD-10-CM

## 2016-12-25 DIAGNOSIS — R748 Abnormal levels of other serum enzymes: Secondary | ICD-10-CM | POA: Diagnosis not present

## 2016-12-25 DIAGNOSIS — H548 Legal blindness, as defined in USA: Secondary | ICD-10-CM | POA: Diagnosis present

## 2016-12-25 DIAGNOSIS — I340 Nonrheumatic mitral (valve) insufficiency: Secondary | ICD-10-CM | POA: Diagnosis not present

## 2016-12-25 DIAGNOSIS — E860 Dehydration: Secondary | ICD-10-CM | POA: Diagnosis present

## 2016-12-25 DIAGNOSIS — R911 Solitary pulmonary nodule: Secondary | ICD-10-CM

## 2016-12-25 DIAGNOSIS — R1013 Epigastric pain: Secondary | ICD-10-CM | POA: Diagnosis not present

## 2016-12-25 DIAGNOSIS — R1110 Vomiting, unspecified: Secondary | ICD-10-CM | POA: Diagnosis not present

## 2016-12-25 DIAGNOSIS — Z9049 Acquired absence of other specified parts of digestive tract: Secondary | ICD-10-CM

## 2016-12-25 DIAGNOSIS — K2270 Barrett's esophagus without dysplasia: Secondary | ICD-10-CM | POA: Diagnosis present

## 2016-12-25 DIAGNOSIS — I10 Essential (primary) hypertension: Secondary | ICD-10-CM | POA: Diagnosis present

## 2016-12-25 DIAGNOSIS — Z7951 Long term (current) use of inhaled steroids: Secondary | ICD-10-CM

## 2016-12-25 DIAGNOSIS — I959 Hypotension, unspecified: Secondary | ICD-10-CM

## 2016-12-25 DIAGNOSIS — Z96641 Presence of right artificial hip joint: Secondary | ICD-10-CM | POA: Diagnosis present

## 2016-12-25 DIAGNOSIS — E872 Acidosis: Secondary | ICD-10-CM | POA: Diagnosis present

## 2016-12-25 DIAGNOSIS — I248 Other forms of acute ischemic heart disease: Secondary | ICD-10-CM | POA: Diagnosis present

## 2016-12-25 DIAGNOSIS — N179 Acute kidney failure, unspecified: Secondary | ICD-10-CM | POA: Diagnosis not present

## 2016-12-25 DIAGNOSIS — R531 Weakness: Secondary | ICD-10-CM

## 2016-12-25 DIAGNOSIS — A419 Sepsis, unspecified organism: Principal | ICD-10-CM | POA: Diagnosis present

## 2016-12-25 DIAGNOSIS — M810 Age-related osteoporosis without current pathological fracture: Secondary | ICD-10-CM | POA: Diagnosis present

## 2016-12-25 DIAGNOSIS — R1111 Vomiting without nausea: Secondary | ICD-10-CM | POA: Diagnosis not present

## 2016-12-25 DIAGNOSIS — Z801 Family history of malignant neoplasm of trachea, bronchus and lung: Secondary | ICD-10-CM

## 2016-12-25 DIAGNOSIS — K219 Gastro-esophageal reflux disease without esophagitis: Secondary | ICD-10-CM | POA: Diagnosis present

## 2016-12-25 DIAGNOSIS — Z79899 Other long term (current) drug therapy: Secondary | ICD-10-CM

## 2016-12-25 DIAGNOSIS — R001 Bradycardia, unspecified: Secondary | ICD-10-CM | POA: Diagnosis not present

## 2016-12-25 DIAGNOSIS — Z808 Family history of malignant neoplasm of other organs or systems: Secondary | ICD-10-CM

## 2016-12-25 DIAGNOSIS — Z8544 Personal history of malignant neoplasm of other female genital organs: Secondary | ICD-10-CM

## 2016-12-25 DIAGNOSIS — J439 Emphysema, unspecified: Secondary | ICD-10-CM | POA: Diagnosis not present

## 2016-12-25 DIAGNOSIS — R778 Other specified abnormalities of plasma proteins: Secondary | ICD-10-CM | POA: Diagnosis present

## 2016-12-25 DIAGNOSIS — Z8249 Family history of ischemic heart disease and other diseases of the circulatory system: Secondary | ICD-10-CM

## 2016-12-25 DIAGNOSIS — D6959 Other secondary thrombocytopenia: Secondary | ICD-10-CM | POA: Diagnosis present

## 2016-12-25 DIAGNOSIS — R7989 Other specified abnormal findings of blood chemistry: Secondary | ICD-10-CM | POA: Diagnosis present

## 2016-12-25 DIAGNOSIS — Z833 Family history of diabetes mellitus: Secondary | ICD-10-CM

## 2016-12-25 DIAGNOSIS — E871 Hypo-osmolality and hyponatremia: Secondary | ICD-10-CM | POA: Diagnosis not present

## 2016-12-25 DIAGNOSIS — I4891 Unspecified atrial fibrillation: Secondary | ICD-10-CM | POA: Diagnosis not present

## 2016-12-25 DIAGNOSIS — Z7983 Long term (current) use of bisphosphonates: Secondary | ICD-10-CM

## 2016-12-25 DIAGNOSIS — H9190 Unspecified hearing loss, unspecified ear: Secondary | ICD-10-CM | POA: Diagnosis present

## 2016-12-25 DIAGNOSIS — Z8049 Family history of malignant neoplasm of other genital organs: Secondary | ICD-10-CM

## 2016-12-25 DIAGNOSIS — D649 Anemia, unspecified: Secondary | ICD-10-CM | POA: Diagnosis not present

## 2016-12-25 DIAGNOSIS — Z452 Encounter for adjustment and management of vascular access device: Secondary | ICD-10-CM | POA: Diagnosis not present

## 2016-12-25 DIAGNOSIS — Z87891 Personal history of nicotine dependence: Secondary | ICD-10-CM

## 2016-12-25 DIAGNOSIS — J9811 Atelectasis: Secondary | ICD-10-CM | POA: Diagnosis not present

## 2016-12-25 DIAGNOSIS — Z85820 Personal history of malignant melanoma of skin: Secondary | ICD-10-CM

## 2016-12-25 DIAGNOSIS — Z8673 Personal history of transient ischemic attack (TIA), and cerebral infarction without residual deficits: Secondary | ICD-10-CM

## 2016-12-25 DIAGNOSIS — R627 Adult failure to thrive: Secondary | ICD-10-CM | POA: Diagnosis present

## 2016-12-25 DIAGNOSIS — Z800 Family history of malignant neoplasm of digestive organs: Secondary | ICD-10-CM

## 2016-12-25 LAB — HEPATIC FUNCTION PANEL
ALK PHOS: 229 U/L — AB (ref 38–126)
ALT: 19 U/L (ref 14–54)
AST: 25 U/L (ref 15–41)
Albumin: 1.8 g/dL — ABNORMAL LOW (ref 3.5–5.0)
BILIRUBIN TOTAL: 0.6 mg/dL (ref 0.3–1.2)
Bilirubin, Direct: 0.3 mg/dL (ref 0.1–0.5)
Indirect Bilirubin: 0.3 mg/dL (ref 0.3–0.9)
Total Protein: 6.1 g/dL — ABNORMAL LOW (ref 6.5–8.1)

## 2016-12-25 LAB — URINALYSIS, ROUTINE W REFLEX MICROSCOPIC
Glucose, UA: NEGATIVE mg/dL
Ketones, ur: NEGATIVE mg/dL
NITRITE: NEGATIVE
PROTEIN: 100 mg/dL — AB
Specific Gravity, Urine: 1.021 (ref 1.005–1.030)
pH: 5 (ref 5.0–8.0)

## 2016-12-25 LAB — BASIC METABOLIC PANEL
ANION GAP: 10 (ref 5–15)
BUN: 54 mg/dL — AB (ref 6–23)
BUN: 58 mg/dL — AB (ref 6–20)
CALCIUM: 7.6 mg/dL — AB (ref 8.9–10.3)
CHLORIDE: 101 meq/L (ref 96–112)
CO2: 19 meq/L (ref 19–32)
CO2: 17 mmol/L — AB (ref 22–32)
CREATININE: 3.02 mg/dL — AB (ref 0.40–1.20)
Calcium: 8.4 mg/dL (ref 8.4–10.5)
Chloride: 107 mmol/L (ref 101–111)
Creatinine, Ser: 3.19 mg/dL — ABNORMAL HIGH (ref 0.44–1.00)
GFR calc Af Amer: 16 mL/min — ABNORMAL LOW (ref >60)
GFR calc non Af Amer: 13 mL/min — ABNORMAL LOW (ref >60)
GFR: 16.1 mL/min — ABNORMAL LOW (ref >60.00)
GLUCOSE: 89 mg/dL (ref 65–99)
Glucose, Bld: 132 mg/dL — ABNORMAL HIGH (ref 70–99)
POTASSIUM: 5.9 meq/L — AB (ref 3.5–5.1)
Potassium: 6.4 mmol/L (ref 3.5–5.1)
Sodium: 131 mEq/L — ABNORMAL LOW (ref 135–145)
Sodium: 134 mmol/L — ABNORMAL LOW (ref 135–145)

## 2016-12-25 LAB — I-STAT CHEM 8, ED
BUN: 82 mg/dL — AB (ref 6–20)
CREATININE: 3.2 mg/dL — AB (ref 0.44–1.00)
Calcium, Ion: 1.05 mmol/L — ABNORMAL LOW (ref 1.15–1.40)
Chloride: 105 mmol/L (ref 101–111)
GLUCOSE: 85 mg/dL (ref 65–99)
HEMATOCRIT: 52 % — AB (ref 36.0–46.0)
Hemoglobin: 17.7 g/dL — ABNORMAL HIGH (ref 12.0–15.0)
Potassium: 7.2 mmol/L (ref 3.5–5.1)
Sodium: 131 mmol/L — ABNORMAL LOW (ref 135–145)
TCO2: 21 mmol/L (ref 0–100)

## 2016-12-25 LAB — I-STAT TROPONIN, ED: TROPONIN I, POC: 0.14 ng/mL — AB (ref 0.00–0.08)

## 2016-12-25 LAB — LIPASE, BLOOD: Lipase: 26 U/L (ref 11–51)

## 2016-12-25 LAB — I-STAT CG4 LACTIC ACID, ED: LACTIC ACID, VENOUS: 1.69 mmol/L (ref 0.5–1.9)

## 2016-12-25 MED ORDER — PANTOPRAZOLE SODIUM 40 MG PO TBEC: 40.0000 mg | DELAYED_RELEASE_TABLET | Freq: Every day | ORAL | Status: DC

## 2016-12-25 MED ORDER — ESCITALOPRAM OXALATE 10 MG PO TABS: 10.0000 mg | ORAL_TABLET | Freq: Every day | ORAL | Status: DC

## 2016-12-25 MED ORDER — OXYCODONE-ACETAMINOPHEN 5-325 MG PO TABS: 1.0000 | ORAL_TABLET | ORAL | Status: DC | PRN

## 2016-12-25 MED ORDER — ACETAMINOPHEN 650 MG RE SUPP: 650.0000 mg | Freq: Four times a day (QID) | RECTAL | Status: DC | PRN

## 2016-12-25 MED ORDER — IPRATROPIUM BROMIDE 0.02 % IN SOLN
0.5000 mg | Freq: Four times a day (QID) | RESPIRATORY_TRACT | Status: DC
  Administered 2016-12-26: 0.5 mg via RESPIRATORY_TRACT
  Filled 2016-12-25: qty 2.5

## 2016-12-25 MED ORDER — ALBUTEROL SULFATE (2.5 MG/3ML) 0.083% IN NEBU: 2.5000 mg | INHALATION_SOLUTION | RESPIRATORY_TRACT | Status: DC | PRN

## 2016-12-25 MED ORDER — ALBUMIN HUMAN 25 % IV SOLN
50.0000 g | Freq: Once | INTRAVENOUS | Status: AC
  Administered 2016-12-26: 50 g via INTRAVENOUS
  Filled 2016-12-25: qty 200

## 2016-12-25 MED ORDER — CIPROFLOXACIN IN D5W 400 MG/200ML IV SOLN
400.0000 mg | Freq: Every day | INTRAVENOUS | Status: DC
Start: 2016-12-26 — End: 2016-12-28
  Administered 2016-12-26 – 2016-12-27 (×3): 400 mg via INTRAVENOUS
  Filled 2016-12-25 (×3): qty 200

## 2016-12-25 MED ORDER — SODIUM CHLORIDE 0.9 % IV SOLN
Freq: Once | INTRAVENOUS | Status: AC
  Administered 2016-12-25: 23:00:00 via INTRAVENOUS

## 2016-12-25 MED ORDER — MORPHINE SULFATE (PF) 2 MG/ML IV SOLN
1.0000 mg | INTRAVENOUS | Status: DC | PRN
  Administered 2016-12-26: 1 mg via INTRAVENOUS
  Filled 2016-12-25: qty 1

## 2016-12-25 MED ORDER — ROFLUMILAST 500 MCG PO TABS
500.0000 ug | ORAL_TABLET | Freq: Every day | ORAL | Status: DC
  Administered 2016-12-26 – 2016-12-27 (×2): 500 ug via ORAL
  Filled 2016-12-25 (×3): qty 1

## 2016-12-25 MED ORDER — ONDANSETRON HCL 4 MG/2ML IJ SOLN
4.0000 mg | Freq: Once | INTRAMUSCULAR | Status: AC
  Administered 2016-12-25: 4 mg via INTRAVENOUS
  Filled 2016-12-25: qty 2

## 2016-12-25 MED ORDER — SODIUM CHLORIDE 0.9 % IV BOLUS (SEPSIS)
1000.0000 mL | Freq: Once | INTRAVENOUS | Status: AC
  Administered 2016-12-25: 1000 mL via INTRAVENOUS

## 2016-12-25 MED ORDER — SODIUM CHLORIDE 0.9% FLUSH
3.0000 mL | Freq: Two times a day (BID) | INTRAVENOUS | Status: DC
  Administered 2016-12-26 – 2016-12-27 (×4): 3 mL via INTRAVENOUS

## 2016-12-25 MED ORDER — FUROSEMIDE 10 MG/ML IJ SOLN: 40.0000 mg | Freq: Once | INTRAMUSCULAR | Status: DC

## 2016-12-25 MED ORDER — FERROUS SULFATE 325 (65 FE) MG PO TABS: 325.0000 mg | ORAL_TABLET | Freq: Two times a day (BID) | ORAL | Status: DC

## 2016-12-25 MED ORDER — SODIUM CHLORIDE 0.9 % IV BOLUS (SEPSIS)
1000.0000 mL | Freq: Once | INTRAVENOUS | Status: AC
  Administered 2016-12-26: 1000 mL via INTRAVENOUS

## 2016-12-25 MED ORDER — ACETAMINOPHEN 325 MG PO TABS: 650.0000 mg | ORAL_TABLET | Freq: Four times a day (QID) | ORAL | Status: DC | PRN

## 2016-12-25 MED ORDER — FLUTICASONE PROPIONATE 50 MCG/ACT NA SUSP
1.0000 | Freq: Every day | NASAL | Status: DC
  Administered 2016-12-26 – 2016-12-27 (×2): 1 via NASAL
  Filled 2016-12-25: qty 16

## 2016-12-25 MED ORDER — ONDANSETRON HCL 4 MG/2ML IJ SOLN
4.0000 mg | Freq: Three times a day (TID) | INTRAMUSCULAR | Status: DC | PRN
  Administered 2016-12-26 – 2016-12-28 (×2): 4 mg via INTRAVENOUS
  Filled 2016-12-25 (×2): qty 2

## 2016-12-25 MED ORDER — BECLOMETHASONE DIPROPIONATE 80 MCG/ACT IN AERS: 2.0000 | INHALATION_SPRAY | Freq: Two times a day (BID) | RESPIRATORY_TRACT | Status: DC

## 2016-12-25 MED ORDER — BUDESONIDE 0.5 MG/2ML IN SUSP
0.5000 mg | Freq: Two times a day (BID) | RESPIRATORY_TRACT | Status: DC
  Administered 2016-12-26 – 2016-12-27 (×4): 0.5 mg via RESPIRATORY_TRACT
  Filled 2016-12-25 (×4): qty 2

## 2016-12-25 MED ORDER — TRAZODONE HCL 50 MG PO TABS
100.0000 mg | ORAL_TABLET | Freq: Every day | ORAL | Status: DC
  Administered 2016-12-26: 100 mg via ORAL
  Filled 2016-12-25: qty 2

## 2016-12-25 MED ORDER — METRONIDAZOLE IN NACL 5-0.79 MG/ML-% IV SOLN
500.0000 mg | Freq: Three times a day (TID) | INTRAVENOUS | Status: DC
  Administered 2016-12-26 – 2016-12-27 (×6): 500 mg via INTRAVENOUS
  Filled 2016-12-25 (×6): qty 100

## 2016-12-25 MED ORDER — SODIUM CHLORIDE 0.9 % IV SOLN
2.0000 g | Freq: Once | INTRAVENOUS | Status: AC
  Administered 2016-12-26: 2 g via INTRAVENOUS
  Filled 2016-12-25: qty 20

## 2016-12-25 MED ORDER — INSULIN ASPART 100 UNIT/ML IV SOLN
5.0000 [IU] | Freq: Once | INTRAVENOUS | Status: AC
  Administered 2016-12-26: 5 [IU] via INTRAVENOUS
  Filled 2016-12-25: qty 0.05

## 2016-12-25 MED ORDER — ROSUVASTATIN CALCIUM 10 MG PO TABS
10.0000 mg | ORAL_TABLET | Freq: Every day | ORAL | Status: DC
Start: 2016-12-26 — End: 2016-12-26

## 2016-12-25 MED ORDER — DEXTROSE 50 % IV SOLN
50.0000 mL | Freq: Once | INTRAVENOUS | Status: AC
  Administered 2016-12-26: 50 mL via INTRAVENOUS
  Filled 2016-12-25: qty 50

## 2016-12-25 NOTE — Telephone Encounter (Signed)
  Recent Labs Lab 12/20/2016 1112  NA 131*  K 5.9*  CL 101  CO2 19  GLUCOSE 132*  BUN 54*  CREATININE 3.02*  CALCIUM 8.4   She has new onset renal failure with hyperkalemia. That explains the decline. These labs were drawn part of getting a repeat ct chest with contrast  Plan  hold off CT chet Go to ER  Dr. Brand Males, M.D., [facility].C.P Pulmonary and Critical Care Medicine Staff Physician [facility] Pager: [phone], If no answer or between  15:00h - 7:00h: call [phone]  12/20/2016 3:34 PM

## 2016-12-25 NOTE — ED Triage Notes (Signed)
Patient was picked up from PCP office, Dr. Bluford Kaufmann, and transported via [facility]. Earlier today patient was seen at her pulmonologist and referred to PCP for low BP. Pt was referred by PCP for further evaluation from PCP for nausea, vomiting, and hypotension. EMS reports patient is alert, oriented x 3, and daughter reports patient has had decreased in mental status.

## 2016-12-25 NOTE — Assessment & Plan Note (Signed)
Stable without flare up OVerall functional status deteriorated   PLAN Deterioration in functional status is due to other issues - please discuss with pcp Marletta Lor, MD  Continue o2 and nebs Home hospice is an option given decline

## 2016-12-25 NOTE — Telephone Encounter (Signed)
With decline - would recommend office visit to have patient evaluated- dont we still have spots this afternoon?

## 2016-12-25 NOTE — Assessment & Plan Note (Signed)
right lower lobe 7 mm lung nodule noted in August 2017 follow-up CT 10/09/2016: The nodule is now enlarged to 1 cm PET scan  Spring 2018 - indeterminate  Plan Repeat CT chest with contrast mid June 2018  - might have to consider empiric XRT referral depending on these results   - too high risk for biopsy or resection  Followup Mid June 2018 with APP or Dr Pamela Hampton after CT chest

## 2016-12-25 NOTE — ED Notes (Signed)
Attempted an IV earlier on arrival but unable to see anything to stick for IV access.

## 2016-12-25 NOTE — Telephone Encounter (Signed)
Daughter states pt's bp was  82/56 today at there pulmonary dr.  Pt complains of pain, and complaining of pain in her back and buttocks area.  Daughter states may be depressed.  Pt is end stage of COPD.  Daughter is concerned because pt is not eating.   Dr Raliegh Ip took the pt off her bp the last ov,  Daughter would like a call back.

## 2016-12-25 NOTE — Progress Notes (Signed)
Subjective:     Patient ID: Pamela Hampton, female   DOB: 09/22/1940, 76 y.o.   MRN: [id number]  PCP Marletta Lor, MD   HPI   PCP Nyoka Cowden, MD  # 60 pack smoker quit 2004.   # Frequent UTI. Gyn cancer 1998  -s/p chemo Rx and. xrt = strong family hx of cancer  #lung imagin  - Feb 2013: No evicende of cancer on CT Chest - dec 2013 0 clear cxrf  #Obesity  - >  30# weight gain since 2010-2012  - Body mass index is 34.86 kg/(m^2).   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 Ratio 51 and DLCO 6.7/30\ Status post pulmonary rehabilitation summer 2013   #AECOPD - September 2013 treated in the office with doxycycline and prednisone on 04/14/2012 - December 2013 4 emergency room treatment with prednisone and antibiotics - July 2014 office treatment with Levaquin and prednisone - Nov 2014: office Rx - March 2015: office telpehone Rx - doxy/[pred - July 2015 - telephone RX pred alone - heat related aecopd - dec 2015 - telephone Rx with pred alone  ...................... OV 03/13/2014  Chief Complaint  Patient presents with  . Follow-up    Pt states her breathing is unchanged. Pt c/o DOE, prod cough with green and white. pt states her cough is worse in monring and when outdoors. Pt states when she becomes very dyspneic then she will experience mid sternal  CP. Pt states she received 2 units of blood in 01/2014 d/t hgb at 7.5   FU COPD with cor pulmonale  - doing well. NO admissions since nov/dec 2014 when last seen. [place] [facility] 2015: Rx over phone for aecopd. Continues qvar and duoneb without a problem. On 4L o2.   Past hx: has chronic anemia and 4 wweeks ago PCP Nyoka Cowden, MD gave 2 units prbc for hgg 7s. Feels better after that. Also  reports chronic nasal stuffiness and head cold and wants ENT referral   OV 11/06/2014  Chief Complaint  Patient presents with  . Follow-up    Pt c/o prod cough with yellow mucus, SOB with activity. Right sided chest tightness occasionally. Denies any chest congestion, n/v/d. Wants to talk about going back to symbicort.   FU COPD with cor pulmonarle  - last seen August 2015. Since that she called in once in December 2015 for COPD exacerbation and received prednisone alonefor an exacerbation. She is on duo neb and Qvar and according to her and her female companion who is with her today she is fairly compliant with this. 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 She got some injection into the eye and since then she's had partial recovery of vision. Despite this she is able to see and uses inhalers   Labs Jan 2016 hgb 9.5gm% which appears stable   OV 06/07/2015  Chief Complaint  Patient presents with  . Follow-up    Pt c/o sinus congestion, PND, prod cough with brown mucus. Pt states her SOB is at baseline. Pt denies CP/tightness.  Follow-up COPD with chronic hypoxemia and respiratory failure and cor pulmonale  - Last seen in April 2016. Since then his called in for an exacerbation. Currently she presents with a female companion. She is on chronic 4 L oxygen. She is on duo neb and Qvar. They both report compliance. She reports good stable health. For the last week she's having increased sinus drainage that is dirty brown in color. She does not know her baseline color but she does note that this is a change from her baseline. There is no fever or worsening cough or wheezing or chest tightness. She otherwise feels good in terms of energy level. This no hemoptysis. This no edema getting worse.  Chest x-ray April 2016  reported to be clear other than large hiatal hernia and COPD. Not personally visualized   OV 12/04/2015  Chief Complaint  Patient presents with  . Follow-up    Pt states her breathing has worsened since last OV. Pt c/o prod cough with yellow mucus. Pt c/o nasal congestion.    Follow-up COPD with chronic hypoxemia and respiratory failure and cor pulmonale  She presents with the family. Last seen in November 2016. Since then she's had one or 2 exacerbations. She also reports having had some rash seen by primary care physician but this is resolved. She is also dealing with chronic sinus issues and dry. I notice that she's not using Nettie pot saline wash which I recommended. She feels more deconditioned than more chronically worsening shortness of breath and in the past. She is due to get home physical therapy. She has chronic fatigue. She hopes home physical therapy will help her. There is no baseline edema. Currently is the last few days of a COPD exacerbation treatment and she is feeling better in that sense. She is open to trying Roflumilast wall preventing COPD exacerbations.    OV 03/11/2016  Chief Complaint  Patient presents with  . Follow-up    Pt states she feels her breathing has worsened since last OV. Pt c/o loss of appetite and prod cough with clear mucus. Pt denies CP/tightness and f/c/s.     Follow-up COPD advanced with chronic hypoxemic respiratory failure on oxygen high symptom burden on Scope  Last visit May 2017. Due to recurrent exacerbations be started her on roflumilast. After that according to chart review she was seen by primary care physician for iron deficiency anemia. She also in June 2017 had an admission for dehydration. In talking to her apparently her daughter thinks all the side effects are coming because of the roflumilast. In addition patient has lost significant amount of weight. Since last visit when she was 164 poundsshe has lost nearly 20 pounds and is  currently at 145 pounds. However patient categorically states that the side effects are not because of roflumilast. She denies any ongoing nausea vomiting. She does have a large hiatal herniaand she does not think the carina splaying any role in makingher lose weight. She says she was losing weight even before starting roflulimilast. In fact in April 2016 she weighed 187 pounds so she at least 17 pound weight loss in 1 year prior to starting rolflumilast. She absolutely refuses to stop roflumilast even for  first time limited trial. Feels COPD is improved.      OV 04/11/2016 Chief Complaint  Patient presents with  . Follow-up    Pt here to review CT chest. Pt c/o prod cough with yellow mucus. Pt denies changes in breathing.  Gold stage 4 copd with chronic hypoxemic resp failure   - Seen last month. Because of weight loss or do CT chest. Also ordered lab work in the key findings are as below. CT chest was personally visualized.  1. Anemia - unchanged from baseline  2. CT chest - large hiatal hernia and can explain weight loss  3. RLL nodule 59mm - suspicion for early stage lung cancer but too small to biopsy or do anything about  4. weight loss - 144# 04/11/2016 (1# loss since last visit in august) - denies any dysphagia from her hiatal hernia  5. Other issue: She presents with her daughter. Daughter states and patient also states new issue of increased cough, yellow sputum, increased chest tightness and wheeze since the rains last week. . No fever or no chills no hemoptysis. She continues to take Roflumilastlast without any problem   OV 10/16/2016    Chief Complaint  Patient presents with  . Follow-up    Pt here for 6 month f/u. Pt states her breathing has worsened since last OV. Pt c/o prod cough - pt unsure of color.    Gold stage IV COPD with chronic hypoxemic respiratory failure  Presents with her daughter. Although she didn't tell the intake nurse that her arrest or sinuses  were she tells me repeatedly that overall she's stable in terms of a cough shortness of breath and sputum production. The recent weather fluctuations between [facility], warm weather and rain amended bit rundown with her symptoms but overall she feels she is stable. She continues with dalriesp and her nebulizers and oxygen. Was no change.  Other issue: right lower lobe 7 mm lung nodule noted in August 2017. She had follow-up CT 10/09/2016: The nodule is now enlarged to 1 cm   OV 12/23/2016  Chief Complaint  Patient presents with  . Follow-up    Pt c/o generalized pain, increase in SOB, decrease in appetite, nonprod cough with chest congestion.     Follow-up Gold stage IV COPD with chronic hypoxemic respiratory failure on presents with daughter. Her functional status has deteriorated to an ECOG of 4. This is because of chronic pain and depression. Daughter thinks this is ever since the nodule diagnosis. COPD itself is stable. She is maintained on 4 L of oxygen and nebulizers. Also does Daliresp. She is chronic pain all over the body currently.  Follow-up right lung nodule 1 cm March 2018. She had PET scan and this was indeterminate. She is high risk for biopsy procedures to resection.    CAT COPD Symptom & Quality of Life Score (GSK trademark) 0 is no burden. 5 is highest burden 04/14/2012  08/03/2012  02/14/2013  06/10/2013   Never Cough -> Cough all the time 1 4, baseline 2 4  No phlegm in chest -> Chest is full of phlegm 5 3, baseline 5 4  No chest tightness -> Chest feels very tight 1 0, /imrpoved 4 4  No dyspnea for 1 flight stairs/hill -> Very dyspneic for 1 flight of stairs 5 5, "improved" 5 5  No limitations for ADL at home -> Very limited with ADL at home 3 5 5 5   Confident leaving home -> Not at all confident leaving home 0 2 3 4   Sleep soundly -> Do not sleep soundly because of lung condition 3 4 4 5   Lots of Energy -> No energy at all 3 5 5 5   TOTAL Score (max 40)  21 28 32 36  has a past medical history of Allergy; Anemia; Arthritis; Barrett's esophagus (07/19/2004); Blood transfusion; COPD (chronic obstructive pulmonary disease) ([place]); Depression; Emphysema of lung ([place]); GERD (gastroesophageal reflux disease); Hepatitis; Hiatal hernia; History of oxygen administration; Hyperlipidemia; Hypertension; Impaired hearing; Obesity; Osteoporosis; Pneumonia; Seizures ([place]); Sleep apnea; Stroke [facility]); Ulcer; and 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   reports that she quit smoking about 14 years ago. Her smoking use included Cigarettes. She has a 60.00 pack-year smoking history. She has never used smokeless tobacco.  Past Surgical History:  Procedure Laterality Date  . ABDOMINAL HYSTERECTOMY    . abdominal laproscopy surgery    . CHOLECYSTECTOMY    . ESOPHAGOGASTRODUODENOSCOPY (EGD) WITH PROPOFOL N/A 12/21/2014   Procedure: ESOPHAGOGASTRODUODENOSCOPY (EGD) WITH PROPOFOL;  Surgeon: Inda Castle, MD;  Location: [facility];  Service: Endoscopy;  Laterality: N/A;  . HEMORRHOID SURGERY    . JOINT REPLACEMENT    . ORIF HIP FRACTURE Right   . TOTAL HIP ARTHROPLASTY Right   . 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 Influenza Split 05/07/2011, 04/14/2012, 06/06/2013  . Influenza Whole 07/08/2007, 04/25/2008, 06/12/2009  . Influenza, High Dose Seasonal PF 08/07/2014, 04/24/2015, 04/11/2016  . Pneumococcal Conjugate-13 08/07/2014  . Pneumococcal Polysaccharide-23 07/07/2011  . Tetanus 04/12/2013  . Zoster 04/19/2012    Family History  Problem Relation Age of Onset  . Heart disease Mother        MI  . Diabetes Mother   . Uterine cancer Mother   . Anemia Mother   . Hypertension Mother   . Stomach cancer Father   . Esophageal cancer Father   . Hypertension Father   . Diabetes Sister   . Anemia Sister   . Esophageal cancer Brother   .  Hypertension Brother   . Diabetes Son   . Diabetes Son   . Brain cancer Brother   . Lung cancer Sister   . Liver cancer Sister   . Colon cancer Neg Hx   . Rectal cancer Neg Hx      Current Outpatient Prescriptions:  .  albuterol (PROVENTIL) (2.5 MG/3ML) 0.083% nebulizer solution, INHALE THE CONTENTS OF 1 VIAL VIA NEBULIZER 4 TIMES A DAY., Disp: 360 mL, Rfl: 2 .  beclomethasone (QVAR) 80 MCG/ACT inhaler, Inhale 2 puffs into the lungs 2 (two) times daily., Disp: 8.7 g, Rfl: 6 .  DALIRESP 500 MCG TABS tablet, TAKE ONE TABLET BY MOUTH ONCE DAILY, Disp: 30 tablet, Rfl: 3 .  escitalopram (LEXAPRO) 10 MG tablet, TAKE ONE TABLET BY MOUTH ONCE DAILY, Disp: 30 tablet, Rfl: 5 .  fluticasone (FLONASE) 50 MCG/ACT nasal spray, PLACE 2 SPRAYS INTO BOTH NOSTRILS DAILY., Disp: 16 g, Rfl: 5 .  ibandronate (BONIVA) 150 MG tablet, take 1 tablet by mouth in the morning on an empty stomach once monthly, Disp: 1 tablet, Rfl: 5 .  ipratropium (ATROVENT) 0.02 % nebulizer solution, INHALE THE CONTENTS OF 1 VIAL VIA NEBULIZER 4 TIMES A DAY., Disp: 300 mL, Rfl: 7 .  omeprazole (PRILOSEC) 20 MG capsule, TAKE ONE CAPSULE BY MOUTH ONCE DAILY, Disp: 90 capsule, Rfl: 3 .  ondansetron (ZOFRAN) 4 MG tablet, Take 1 tablet (4 mg total) by mouth every 8 (eight) hours as needed for nausea or vomiting., Disp: 20 tablet, Rfl: 0 .  OXYGEN, Inhale 4 L/min into the lungs continuous., Disp: , Rfl:  .  polyethylene glycol powder (GLYCOLAX/MIRALAX) powder, Take 17  g by mouth 2 (two) times daily as needed. (Patient taking differently: Take 17 g by mouth daily as needed for mild constipation. ), Disp: 3350 g, Rfl: 1 .  potassium chloride SA (K-DUR,KLOR-CON) 20 MEQ tablet, TAKE ONE TABLET BY MOUTH ONCE DAILY, Disp: 30 tablet, Rfl: 0 .  PROAIR HFA 108 (90 Base) MCG/ACT inhaler, inhale TWO PUFFS BY MOUTH EVERY 4 HOURS AS NEEDED for SHORTNESS OF BREATH, Disp: 8.5 g, Rfl: 5 .  rosuvastatin (CRESTOR) 10 MG tablet, TAKE ONE TABLET BY MOUTH ONCE  DAILY, Disp: 112 tablet, Rfl: 1 .  traZODone (DESYREL) 100 MG tablet, Take 1 tablet (100 mg total) by mouth at bedtime., Disp: 90 tablet, Rfl: 1 .  trimethoprim (TRIMPEX) 100 MG tablet, Take 100 mg by mouth daily., Disp: , Rfl: 11    Review of Systems     Objective:   Physical Exam  Vitals:   12/23/2016 1012  BP: (!) 82/56  Pulse: 89  SpO2: 98%  Weight: 130 lb (59 kg)  Height: 5\' 3"  (1.6 m)    Estimated body mass index is 23.03 kg/m as calculated from the following:   Height as of this encounter: 5\' 3"  (1.6 m).   Weight as of this encounter: 130 lb (59 kg). She looks markedly decline in functional status and affect while sitting in a wheelchair. Frail Not talking much Seems depressed No respiratory distress No wheezing On oxygen Normal heart sounds No edeema Dry sking      Assessment:       ICD-9-CM ICD-10-CM   1. Stage 4 very severe COPD by GOLD classification ([place]) 496 J44.9 CT Chest W Contrast  2. Nodule of right lung 793.11 R91.1 CT Chest W Contrast       Plan:     Stage 4 very severe COPD by GOLD classification ([place]) Stable without flare up OVerall functional status deteriorated   PLAN Deterioration in functional status is due to other issues - please discuss with pcp Marletta Lor, MD  Continue o2 and nebs Home hospice is an option given decline   Nodule of right lung  right lower lobe 7 mm lung nodule noted in August 2017 follow-up CT 10/09/2016: The nodule is now enlarged to 1 cm PET scan  Spring 2018 - indeterminate  Plan Repeat CT chest with contrast mid June 2018  - might have to consider empiric XRT referral depending on these results   - too high risk for biopsy or resection  Followup Mid June 2018 with APP or Dr Lamonte Sakai after CT chest       Dr. Brand Males, M.D., [facility].C.P Pulmonary and Critical Care Medicine Staff Physician [place] [facility] Pager: [phone], If no answer or  between  15:00h - 7:00h: call [phone]  12/17/2016 10:43 AM

## 2016-12-25 NOTE — Progress Notes (Signed)
HPI:  Pamela Hampton is a pleasant 76 yo here with her daughter for an acute visit for low blood pressure, weakness, nausea, vomiting and severe pain "all over". Daughter reports severe COPD and significant deterioration in the last 1 month with even further worsening the last few days.  The last few days daughter reports she has been "spacing out", weak and now today has been begging to go to the hospital as has pain "all over" and vomited once yesterday and once here today. Reports they saw her pulmonologist today were told it may not be her lung disease, but needed to see PCP and that hospice was discussed. Not eating much, but daughter reports drinking plenty of fluids. Pt and daughter report they have talked and don't want hospice right now. They want to know what is wrong and pt wants to go to the hospital. Daughter reports she has gotten significantly worse today and she is afraid to take patient home and feels needs ambulance to get her to the hospital. Labs today showed AKI, elevated potassium, low sodium. Denies dysuria,CP,dysuria, fevers, diarrhea, headache.   ROS: See pertinent positives and negatives per HPI.  Past Medical History:  Diagnosis Date  . Allergy    Rhinitis  . Anemia   . Arthritis   . Barrett's esophagus 07/19/2004  . Blood transfusion   . COPD (chronic obstructive pulmonary disease) ([facility])    wears 3 liters of o2 continious  . Depression   . Emphysema of lung ([place])   . GERD (gastroesophageal reflux disease)    Barrett's esophagus  . Hepatitis   . Hiatal hernia   . History of oxygen administration    oxygen concentrator @ 4 l/m nasally 24/ 7  . Hyperlipidemia   . Hypertension   . Impaired hearing   . Obesity   . Osteoporosis   . Pneumonia   . Seizures ([place])   . Sleep apnea   . Stroke ([place])   . Ulcer   . Vaginal cancer ([place])    radiation implant '97. radiation. chemo.    Past Surgical History:  Procedure Laterality Date  . ABDOMINAL HYSTERECTOMY     . abdominal laproscopy surgery    . CHOLECYSTECTOMY    . ESOPHAGOGASTRODUODENOSCOPY (EGD) WITH PROPOFOL N/A 12/21/2014   Procedure: ESOPHAGOGASTRODUODENOSCOPY (EGD) WITH PROPOFOL;  Surgeon: Inda Castle, MD;  Location: [facility];  Service: Endoscopy;  Laterality: N/A;  . HEMORRHOID SURGERY    . JOINT REPLACEMENT    . ORIF HIP FRACTURE Right   . TOTAL HIP ARTHROPLASTY Right   . 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 Heart disease Mother        MI  . Diabetes Mother   . Uterine cancer Mother   . Anemia Mother   . Hypertension Mother   . Stomach cancer Father   . Esophageal cancer Father   . Hypertension Father   . Diabetes Sister   . Anemia Sister   . Esophageal cancer Brother   . Hypertension Brother   . Diabetes Son   . Diabetes Son   . Brain cancer Brother   . Lung cancer Sister   . Liver cancer Sister   . Colon cancer Neg Hx   . Rectal cancer Neg Hx     Social History   Social History  . Marital status: Divorced    Spouse name: N/A  .  Number of children: 4  . Years of education: N/A   Occupational History  . disaled Retired   Social History Main Topics  . Smoking status: Former Smoker    Packs/day: 2.00    Years: 30.00    Types: Cigarettes    Quit date: 07/28/2002  . Smokeless tobacco: Never Used  . Alcohol use No  . Drug use: No  . Sexual activity: Not Currently   Other Topics Concern  . None   Social History Narrative  . None     Current Outpatient Prescriptions:  .  albuterol (PROVENTIL) (2.5 MG/3ML) 0.083% nebulizer solution, INHALE THE CONTENTS OF 1 VIAL VIA NEBULIZER 4 TIMES A DAY., Disp: 360 mL, Rfl: 2 .  beclomethasone (QVAR) 80 MCG/ACT inhaler, Inhale 2 puffs into the lungs 2 (two) times daily., Disp: 8.7 g, Rfl: 6 .  DALIRESP 500 MCG TABS tablet, TAKE ONE TABLET BY MOUTH ONCE DAILY, Disp: 30 tablet, Rfl: 3 .  escitalopram  (LEXAPRO) 10 MG tablet, TAKE ONE TABLET BY MOUTH ONCE DAILY, Disp: 30 tablet, Rfl: 5 .  fluticasone (FLONASE) 50 MCG/ACT nasal spray, PLACE 2 SPRAYS INTO BOTH NOSTRILS DAILY., Disp: 16 g, Rfl: 5 .  ibandronate (BONIVA) 150 MG tablet, take 1 tablet by mouth in the morning on an empty stomach once monthly, Disp: 1 tablet, Rfl: 5 .  ipratropium (ATROVENT) 0.02 % nebulizer solution, INHALE THE CONTENTS OF 1 VIAL VIA NEBULIZER 4 TIMES A DAY., Disp: 300 mL, Rfl: 7 .  omeprazole (PRILOSEC) 20 MG capsule, TAKE ONE CAPSULE BY MOUTH ONCE DAILY, Disp: 90 capsule, Rfl: 3 .  ondansetron (ZOFRAN) 4 MG tablet, Take 1 tablet (4 mg total) by mouth every 8 (eight) hours as needed for nausea or vomiting., Disp: 20 tablet, Rfl: 0 .  OXYGEN, Inhale 4 L/min into the lungs continuous., Disp: , Rfl:  .  polyethylene glycol powder (GLYCOLAX/MIRALAX) powder, Take 17 g by mouth 2 (two) times daily as needed. (Patient taking differently: Take 17 g by mouth daily as needed for mild constipation. ), Disp: 3350 g, Rfl: 1 .  potassium chloride SA (K-DUR,KLOR-CON) 20 MEQ tablet, TAKE ONE TABLET BY MOUTH ONCE DAILY, Disp: 30 tablet, Rfl: 0 .  PROAIR HFA 108 (90 Base) MCG/ACT inhaler, inhale TWO PUFFS BY MOUTH EVERY 4 HOURS AS NEEDED for SHORTNESS OF BREATH, Disp: 8.5 g, Rfl: 5 .  rosuvastatin (CRESTOR) 10 MG tablet, TAKE ONE TABLET BY MOUTH ONCE DAILY, Disp: 112 tablet, Rfl: 1 .  traZODone (DESYREL) 100 MG tablet, Take 1 tablet (100 mg total) by mouth at bedtime., Disp: 90 tablet, Rfl: 1 .  trimethoprim (TRIMPEX) 100 MG tablet, Take 100 mg by mouth daily., Disp: , Rfl: 11 .  ferrous sulfate 325 (65 FE) MG tablet, Take 325 mg by mouth 2 (two) times daily., Disp: , Rfl: 6  EXAM:  Vitals:   12/16/2016 1505  BP: (!) 86/62  Pulse: 82    Body mass index is 23.03 kg/m.  GENERAL: vitals reviewed and listed above, alert - but spaces out at times and stares into space, appears uncomfortable, dry heaves x1  HEENT: atraumatic, poor  hearing, conjunttiva clear, no obvious abnormalities on inspection of external nose and ears  NECK: no obvious masses on inspection  LUNGS: on O2, labored breathing  CV: HRRR, no sig peripheral edema  ABD: soft, NTTP  MS: in wheelchair  PSYCH: appears uncomfortable, staring spells in between fairly appropriate responses to questions  ASSESSMENT AND PLAN:  Discussed the following assessment  and plan:  Weakness  Hypotension, unspecified hypotension type  Body aches  AKI (acute kidney injury) ([place])  Hyperkalemia  Hyponatremia  -weakness, hypotension, pain, AMS, hypotension with AKI and electrolyte changes on review results from BMP done today at pulmonologist office -pt uncomfortable - unsure etiology pain/vomiting/hypotension/recent decline/AKI - differential broad -discussed hospice (as daughter brought this up) vs evaluation outpt vs hospital eval - pt and daughter declined hospice today -they want EMS transport to hospital as pt and daughter report "afraid she won't make it" if drive her or take her home -hand off to EMS per above -assistant to notify hospital triage  -Patient advised to return or notify a doctor immediately if symptoms worsen or persist or new concerns arise.  There are no Patient Instructions on file for this visit.  Colin Benton R., DO

## 2016-12-25 NOTE — Progress Notes (Signed)
Pharmacy Antibiotic Note  Pamela Hampton is a 76 y.o. female c/o nausea, vomiting, abdominal pain and generalized weakness admitted on 11/25/2016 with sepsis. /possible colitis Pharmacy has been consulted for cipro dosing.  Plan: Cipro 400 mg IV q24h Flagyl 500 mg IV q8h (MD) F/u scr/cultures  Height: 5\' 3"  (160 cm) Weight: 130 lb (59 kg) IBW/kg (Calculated) : 52.4  Temp (24hrs), Avg:98 F (36.7 C), Min:97.9 F (36.6 C), Max:98 F (36.7 C)   Recent Labs Lab 12/08/2016 1112 12/08/2016 1859 12/21/2016 1958  WBC  --   --  14.5*  CREATININE 3.02* 3.20* 3.19*  LATICACIDVEN  --  1.69  --     Estimated Creatinine Clearance: 13 mL/min (A) (by C-G formula based on SCr of 3.19 mg/dL (H)).    No Known Allergies  Antimicrobials this admission: 5/31 cipro >>  5/31 flagyl >>   Dose adjustments this admission:   Microbiology results:  BCx:   UCx:    Sputum:    MRSA PCR:   Thank you for allowing pharmacy to be a part of this patient's care.  Dorrene German 12/13/2016 11:29 PM

## 2016-12-25 NOTE — ED Notes (Signed)
Called to give report- no response

## 2016-12-25 NOTE — Telephone Encounter (Signed)
Pt saw Dr. Julianne Rice office 3:15 today 12/10/2016. Received a call from their office, Sheena H, CMA, and was advised they are sending pt via EMS d/t her labs. Sheena states Dr. Maudie Mercury reviewed these results with pt. Sheena also states pt was c/o increase in generalized pain and was having episodes of emesis.   Will forward to MR to make aware.

## 2016-12-25 NOTE — Telephone Encounter (Signed)
Pt seen by Pulmonary today with recommendation to consider Hospice referral due to rapid continuous decline in pt's health. BP continues to remain low despite stopping Losartan. OV notes from today note no respiratory distress. Labs today have not been reviewed, but do have abnormalities.   Dr. Yong Channel would you please review patient chart and give any recommendations.

## 2016-12-25 NOTE — H&P (Addendum)
History and Physical    RUBYLEE Hampton MMN:[id number] DOB: 1941/01/11 DOA: 12/15/2016  Referring MD/NP/PA:   PCP: Marletta Lor, MD   Patient coming from:  The patient is coming from home.  At baseline, pt is partially dependent for most of ADL.     Chief Complaint: Nausea, vomiting, abdominal pain, generalized weakness  HPI: Pamela Hampton is a 76 y.o. female with medical history significant of hypertension, hyperlipidemia, COPD, chronic respiratory failure on 4 L oxygen, GERD, depression, stroke, seizure, ulcer, anemia, Barrett's esophagus, blood disorder (patient had some type of blood disorder per her daughter, but not sure which type, not treated), vaginal cancer (s/p of hysterectomy, radiation implant 1997, and chemotherapy), who presents with nausea, vomiting, abdominal pain and generalized weakness.  Per patient's daughter, patient has been having nausea and vomiting and abdominal pain for almost 20 days. She vomited 3-4 times each day. No diarrhea, fever or chills. Her abdominal pain is located in the epigastric area, constant, moderate, nonradiating. It is not aggravated or alleviated by any factors. Patient has mild cough and shortness rest, which is at the baseline. No wheezing or sputum production. Patient states that she had mild lower chest pain recently, but denies any chest pain today. Denies symptoms of UTI or unilateral weakness. No hematemesis or hematochezia. Patient was seen by her pulmonologist today, and referred to PCP due to hypotension, and her PCP sent pt to ED for further evaluation and treatment. Daughter states that the patient had mild confusion, but currently patient is alert, oriented 3 when I saw patient in ED. Pt was noted to have swelling and redness in left 2nd and 4th toe by her daughter. Pt states that she has pain all over, particularly in right upper back.  ED Course: pt was found to have  hypotension with SBP 70s-80s which responded to IV fluid  resuscitation, and improved to SBP above 90, WBC 14.5, hemoglobin 7.6 which was 9.6 on 12/05/16, repeat 122, lactic acid to 1.69, potassium 7.2, 6.4, bicarbonate 17, acute renal injury with creatinine 3.19, BUN 58, normal abdomen, pending urinalysis, temperature normal, bradycardia, oxygen saturation 91-97% on 4 L oxygen, negative chest x-ray for acute abnormalities, lipase 26, negative FOBT. CT abdomen/pelvis showed possible colitis or enteritis. Patient is admitted to stepdown as inpatient. Per EDP, lab called and reported abnormal blood cell abnormality, but needs pulmonologist to review for diagnosis.   Review of Systems:   General: fevers, chills, has poor appetite, has fatigue HEENT: no blurry vision, hearing changes or sore throat Respiratory: no dyspnea, coughing, wheezing CV: no chest pain, no palpitations GI: has nausea, vomiting, abdominal pain, no diarrhea, constipation GU: no dysuria, burning on urination, increased urinary frequency, hematuria  Ext: no leg edema Neuro: no unilateral weakness, numbness, or tingling, no vision change or hearing loss Skin: no rash, no skin tear. MSK: No muscle spasm, no deformity, no limitation of range of movement in spin Heme: No easy bruising.  Travel history: No recent long distant travel.  Allergy: No Known Allergies  Past Medical History:  Diagnosis Date  . Allergy    Rhinitis  . Anemia   . Arthritis   . Barrett's esophagus 07/19/2004  . Blood transfusion   . COPD (chronic obstructive pulmonary disease) (HCC)    wears 3 liters of o2 continious  . Depression   . Emphysema of lung ([place])   . GERD (gastroesophageal reflux disease)    Barrett's esophagus  . Hepatitis   . Hiatal hernia   .  History of oxygen administration    oxygen concentrator @ 4 l/m nasally 24/ 7  . Hyperlipidemia   . Hypertension   . Impaired hearing   . Obesity   . Osteoporosis   . Pneumonia   . Seizures ([place])   . Sleep apnea   . Stroke ([place])   . Ulcer     . Vaginal cancer ([place])    radiation implant '97. radiation. chemo.    Past Surgical History:  Procedure Laterality Date  . ABDOMINAL HYSTERECTOMY    . abdominal laproscopy surgery    . CHOLECYSTECTOMY    . ESOPHAGOGASTRODUODENOSCOPY (EGD) WITH PROPOFOL N/A 12/21/2014   Procedure: ESOPHAGOGASTRODUODENOSCOPY (EGD) WITH PROPOFOL;  Surgeon: Inda Castle, MD;  Location: [facility];  Service: Endoscopy;  Laterality: N/A;  . HEMORRHOID SURGERY    . JOINT REPLACEMENT    . ORIF HIP FRACTURE Right   . TOTAL HIP ARTHROPLASTY Right   . 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 Her smoking use included Cigarettes. She has a 60.00 pack-year smoking history. She has never used smokeless tobacco. She reports that she does not drink alcohol or use drugs.  Family History:  Family History  Problem Relation Age of Onset  . Heart disease Mother        MI  . Diabetes Mother   . Uterine cancer Mother   . Anemia Mother   . Hypertension Mother   . Stomach cancer Father   . Esophageal cancer Father   . Hypertension Father   . Diabetes Sister   . Anemia Sister   . Esophageal cancer Brother   . Hypertension Brother   . Diabetes Son   . Diabetes Son   . Brain cancer Brother   . Lung cancer Sister   . Liver cancer Sister   . Colon cancer Neg Hx   . Rectal cancer Neg Hx      Prior to Admission medications   Medication Sig Start Date End Date Taking? Authorizing Provider  albuterol (PROVENTIL) (2.5 MG/3ML) 0.083% nebulizer solution INHALE THE CONTENTS OF 1 VIAL VIA NEBULIZER 4 TIMES A DAY. 11/08/15  Yes Brand Males, MD  beclomethasone (QVAR) 80 MCG/ACT inhaler Inhale 2 puffs into the lungs 2 (two) times daily. 12/01/14  Yes Brand Males, MD  DALIRESP 500 MCG TABS tablet TAKE ONE TABLET BY MOUTH ONCE DAILY 12/08/16  Yes Brand Males, MD  escitalopram (LEXAPRO)  10 MG tablet TAKE ONE TABLET BY MOUTH ONCE DAILY 10/28/16  Yes Marletta Lor, MD  ferrous sulfate 325 (65 FE) MG tablet Take 325 mg by mouth 2 (two) times daily. 12/03/16  Yes [provider]  fluticasone (FLONASE) 50 MCG/ACT nasal spray PLACE 2 SPRAYS INTO BOTH NOSTRILS DAILY. 07/06/15  Yes Marletta Lor, MD  ibandronate (BONIVA) 150 MG tablet take 1 tablet by mouth in the morning on an empty stomach once monthly 10/02/16  Yes Marletta Lor, MD  ipratropium (ATROVENT) 0.02 % nebulizer solution INHALE THE CONTENTS OF 1 VIAL VIA NEBULIZER 4 TIMES A DAY. 10/17/16  Yes Brand Males, MD  omeprazole (PRILOSEC) 20 MG capsule TAKE ONE CAPSULE BY MOUTH ONCE DAILY 12/08/16  Yes Brand Males, MD  OXYGEN Inhale 4 L/min into the lungs continuous.   Yes [provider]  potassium chloride SA (K-DUR,KLOR-CON) 20 MEQ tablet TAKE ONE TABLET BY MOUTH ONCE DAILY 12/03/16  Yes  Marletta Lor, MD  rosuvastatin (CRESTOR) 10 MG tablet TAKE ONE TABLET BY MOUTH ONCE DAILY 12/08/16  Yes Marin Olp, MD  traZODone (DESYREL) 100 MG tablet Take 1 tablet (100 mg total) by mouth at bedtime. 10/03/16  Yes Marletta Lor, MD  trimethoprim (TRIMPEX) 100 MG tablet Take 100 mg by mouth daily. 10/27/16  Yes [provider]  ondansetron (ZOFRAN) 4 MG tablet Take 1 tablet (4 mg total) by mouth every 8 (eight) hours as needed for nausea or vomiting. 12/12/16   Marletta Lor, MD  polyethylene glycol powder (GLYCOLAX/MIRALAX) powder Take 17 g by mouth 2 (two) times daily as needed. Patient taking differently: Take 17 g by mouth daily as needed for mild constipation.  08/30/14   Marletta Lor, MD  PROAIR HFA 108 [phone] Base) MCG/ACT inhaler inhale TWO PUFFS BY MOUTH EVERY 4 HOURS AS NEEDED for SHORTNESS OF BREATH 10/28/16   Marletta Lor, MD    Physical Exam: Vitals:   12/07/2016 2300 12/20/2016 2301 12/07/2016 2337 12/26/16 0317  BP: (!) 87/25 (!) 95/55    Pulse: 73 79     Resp: 14 14    Temp:  97.9 F (36.6 C) 97.6 F (36.4 C) 97.4 F (36.3 C)  TempSrc:  Oral Oral Oral  SpO2: 100% 100%    Weight:      Height:       General: Not in acute distress HEENT:       Eyes: PERRL, EOMI, no scleral icterus.       ENT: No discharge from the ears and nose, no pharynx injection, no tonsillar enlargement.        Neck: No JVD, no bruit, no mass felt. Heme: No neck lymph node enlargement. Cardiac: S1/S2, RRR, No murmurs, No gallops or rubs. Respiratory:  No rales, wheezing, rhonchi or rubs. GI: Soft, nondistended, has tenderness in epigastric area, no rebound pain, no organomegaly, BS present. GU: No hematuria Ext: No pitting leg edema bilaterally. 2+DP/PT pulse bilaterally. Has sausage looking toes in 2nd and 4th toes of left foot. Musculoskeletal: No joint deformities, No joint redness or warmth, no limitation of ROM in spin. Skin: No rashes.  Neuro: Alert, oriented X3, cranial nerves II-XII grossly intact, moves all extremities normally.  Psych: Patient is not psychotic, no suicidal or hemocidal ideation.  Labs on Admission: I have personally reviewed following labs and imaging studies  CBC:  Recent Labs Lab 12/08/2016 1859 12/13/2016 1958  WBC  --  14.5*  NEUTROABS  --  PENDING  HGB 17.7* 7.6*  HCT 52.0* 21.9*  MCV  --  83.6  PLT  --  681*   Basic Metabolic Panel:  Recent Labs Lab 12/05/2016 1112 11/26/2016 1859 12/19/2016 1958 11/28/2016 2326  NA 131* 131* 134* 135  K 5.9* 7.2* 6.4* 6.4*  CL 101 105 107 109  CO2 19  --  17* 16*  GLUCOSE 132* 85 89 89  BUN 54* 82* 58* 57*  CREATININE 3.02* 3.20* 3.19* 3.06*  CALCIUM 8.4  --  7.6* 7.5*   GFR: Estimated Creatinine Clearance: 13.5 mL/min (A) (by C-G formula based on SCr of 3.06 mg/dL (H)). Liver Function Tests:  Recent Labs Lab 11/25/2016 1958  AST 25  ALT 19  ALKPHOS 229*  BILITOT 0.6  PROT 6.1*  ALBUMIN 1.8*    Recent Labs Lab 12/24/2016 1958  LIPASE 26   No results for input(s):  AMMONIA in the last 168 hours. Coagulation Profile: No results for input(s):  INR, PROTIME in the last 168 hours. Cardiac Enzymes: No results for input(s): CKTOTAL, CKMB, CKMBINDEX, TROPONINI in the last 168 hours. BNP (last 3 results) No results for input(s): PROBNP in the last 8760 hours. HbA1C: No results for input(s): HGBA1C in the last 72 hours. CBG: No results for input(s): GLUCAP in the last 168 hours. Lipid Profile: No results for input(s): CHOL, HDL, LDLCALC, TRIG, CHOLHDL, LDLDIRECT in the last 72 hours. Thyroid Function Tests: No results for input(s): TSH, T4TOTAL, FREET4, T3FREE, THYROIDAB in the last 72 hours. Anemia Panel: No results for input(s): VITAMINB12, FOLATE, FERRITIN, TIBC, IRON, RETICCTPCT in the last 72 hours. Urine analysis:    Component Value Date/Time   COLORURINE AMBER (A) 12/10/2016 2229   APPEARANCEUR CLOUDY (A) 12/11/2016 2229   LABSPEC 1.021 12/09/2016 2229   PHURINE 5.0 12/06/2016 2229   GLUCOSEU NEGATIVE 12/14/2016 2229   HGBUR MODERATE (A) 12/17/2016 2229   HGBUR small 04/24/2010 1002   BILIRUBINUR SMALL (A) 12/05/2016 2229   BILIRUBINUR N 08/19/2016 1405   KETONESUR NEGATIVE 11/28/2016 2229   PROTEINUR 100 (A) 12/18/2016 2229   UROBILINOGEN 0.2 08/19/2016 1405   UROBILINOGEN 0.2 04/24/2010 1002   NITRITE NEGATIVE 12/17/2016 2229   LEUKOCYTESUR SMALL (A) 12/08/2016 2229   Sepsis Labs: _0 (procalcitonin:4,lacticidven:4) ) Recent Results (from the past 240 hour(s))  MRSA PCR Screening     Status: None   Collection Time: 12/21/2016 11:25 PM  Result Value Ref Range Status   MRSA by PCR NEGATIVE NEGATIVE Final    Comment:        The GeneXpert MRSA Assay (FDA approved for NASAL specimens only), is one component of a comprehensive MRSA colonization surveillance program. It is not intended to diagnose MRSA infection nor to guide or monitor treatment for MRSA infections.      Radiological Exams on Admission: Ct Abdomen Pelvis Wo  Contrast  Result Date: 12/11/2016 CLINICAL DATA:  Nausea vomiting and hypotension EXAM: CT ABDOMEN AND PELVIS WITHOUT CONTRAST TECHNIQUE: Multidetector CT imaging of the abdomen and pelvis was performed following the standard protocol without IV contrast. COMPARISON:  11/12/2016, PET-CT 10/30/2016, CT abdomen pelvis 01/16/2005 FINDINGS: Lower chest: Lung bases demonstrate no acute consolidation or pleural effusion. Large hiatal hernia. The heart is slightly enlarged. There are coronary artery calcifications. Hepatobiliary: No intra hepatic biliary dilatation. Surgical clips in the gallbladder fossa. Stable enlarged extrahepatic common bile duct. Pancreas: Unremarkable. No pancreatic ductal dilatation or surrounding inflammatory changes. Spleen: Small splenic tissue in the left upper quadrant. Adrenals/Urinary Tract: Adrenal glands are within normal limits. Nonspecific perinephric fat stranding. Prominent renal pelvises bilaterally without ureteral stone. Small exophytic lesion mid right kidney. Small stones versus intrarenal vascular calcifications within both kidneys. The bladder is unremarkable. Stomach/Bowel: Stomach nonenlarged. No dilated small bowel. None questionable mild wall thickening involving the cecum, and ascending colon with minimal haziness in the adjacent pericolonic fat. Appendix not well visualized. Remainder of the colon shows no focal wall thickening. Sigmoid colon diverticula. Vascular/Lymphatic: Extensive vascular calcifications. Few nonspecific lymph nodes within the hiatal hernia fat. No significantly enlarged pelvic lymph nodes. Reproductive: Status post hysterectomy. No adnexal masses. Other: Soft tissue density with scattered calcifications extending to the skin, posterior to the left inferior sacrum as before. This measures up to 5 cm. A fat density mass was noted in the region on 2006 CT. No free air or free fluid. Musculoskeletal: Scoliosis of the spine. Status post right hip  replacement with artifact and limiting factors. IMPRESSION: 1. Questionable mild wall thickening involving the  cecum and ascending colon, difficult to exclude a mild colitis or enteritis. No evidence for a bowel obstruction. The remainder of the colon is within normal limits. 2. Post cholecystectomy with enlarged extrahepatic common bowel duct, likely due to post cholecystectomy change 3. Stable soft tissue mass with scattered calcifications posterior to the sacrum. Electronically Signed   By: Donavan Foil M.D.   On: 12/22/2016 21:11   Dg Chest Port 1 View  Result Date: 12/04/2016 CLINICAL DATA:  76 year old presenting with acute onset of shortness of breath. EXAM: PORTABLE CHEST 1 VIEW COMPARISON:  12/05/2016, 11/06/2014 and earlier, including CT chest 10/09/2016 and earlier. PET-CT 10/30/2016. FINDINGS: Suboptimal inspiration accounts for crowded bronchovascular markings diffusely and atelectasis in the bases, and accentuates the cardiac silhouette. Taking this into account, cardiac silhouette upper normal in size to slightly enlarged but stable. The previously identified large hiatal hernia is less conspicuous due to technique. Lungs otherwise clear. Pulmonary vascularity normal. No visible pleural effusions. IMPRESSION: Suboptimal inspiration accounts for bibasilar atelectasis. No acute cardiopulmonary disease otherwise. Stable borderline heart size. Electronically Signed   By: Evangeline Dakin M.D.   On: 12/02/2016 18:23     EKG: Independently reviewed.  Sinus rhythm, QTC 433, PVC, low voltage, nonspecific T-wave change.   Assessment/Plan Principal Problem:   Sepsis ([place]) Active Problems:   HLD (hyperlipidemia)   Iron deficiency anemia   Depression   Essential hypertension   GERD   Stage 4 very severe COPD by GOLD classification ([place])   Chronic respiratory failure with hypoxia (HCC)   Hyperkalemia   Hypotension   Nausea & vomiting   AKI (acute kidney injury) (HCC)   Abdominal pain    Normocytic anemia   COPD (chronic obstructive pulmonary disease) (HCC)   Elevated troponin   Back pain   Sepsis [facility]): Patient meets criteria for sepsis with leukocytosis, hypotension, tachypnea. Lactate is normal. Blood pressure has been responding to  IV fluids resuscitation, but very soft. Current blood pressure is 95/55. Mental status normal. Source of infection is likely from GI. Patient has nausea and vomiting. CT abdomen/pelvis showed possible colitis versus enteritis. Patient does not have symptoms for UTI. Urinalysis has small amount of leukocyte. Chest x-rays negative.  -will admit to SDU as inpt -IV flagyl and cipro for possible colitis -Aggressive IV fluids: 3L NS bolus, then 150 cc/h -will get Procalcitonin and trend lactic acid levels per sepsis protocol. -f/u Bx and Ux -will give 50 g of albumin (her albumin is low 1.8) - PCCM. Dr. Oletta Darter was consulted, will see pt in AM.  Possible colitis vs. Enteritis: Patient has nausea, vomiting and abdominal pain, but no diarrhea -see above  AKI: Cre 3.19 and BUN 58. Differential diagnoses include prerenal failure secondary to dehydration, ATN given hypotension and trimethoprim use. - IVF as above - Check FeNa - Follow up renal function by BMP - Hold trimethoprim  Hyperkalemia:  Potassium 7.2-->6.4, no EKG change.  -will hold her home K-dur -cannot give Kayexalate to due to possible colitis -Aggressive IV fluid as above -2 g of calcium gluconate, D50, NovoLog 5 units -will give 40 mg of IV lasix after Bp is stabilized and finishing 3L of NS   Elevated trop: Trop 0.14. Patient states that she had a mild chest pain recently, but it has resolved. Currently no chest pain. Likely due to demand ischemia secondary to sepsis. - cycle CE q6 x3 and repeat EKG in the am  - and aspirin, crestor - Risk factor stratification: will check FLP  and A1C  - 2d echo  Back pain: Patient has pain all over, particularly in the right upper back.  Patient has digitalis (sausage toes) on physical examination. Etiology is not clear. Potential differential diagnosis is reactive arthritis. Pt cannot use NSAIDs due to AKI. -will check ESR, CRP and  ANA -prn percocet -empiric prednisione, 10 mg daily.  Addendum: since pt's bp is still very soft, cannot give Lasix. Pt has very mild nausea, and no vomiting or diarrhea now-->will give Kayexalate 30 g 1  Chronic respiratory failure with hypoxia and COPD: on 4L oxygen at home. Stable. - Atrovent nebulizers, when necessary albuterol nebulizers -Pulmicort nebs -Daliresp  HLD:  -Crestor  Normocytic anemia and hx of Iron deficiency anemia: Hemoglobin dropped from 9.6 on 12/05/16-7.6. FOBT is negative. Etiology is not clear. Per EDP, lab called and reported abnormal blood cell abnormality, but needs pulmonologist to review for diagnosis. Per atient's daughter, patient has history of blood disorder 1970, not sure which type of blood disorder, not treated. -repeat CBC in AM -check LDH, haptoglobin, peripheral smear -INR, PTT, type and screen -Continue home iron supplement.  Depression: Stable, no suicidal or homicidal ideations. -Continue home medications: Lexapro  Essential hypertension: now has hypotension -not on Bp meds  GERD: -Protonix   DVT ppx: SCD Code Status: Full code Family Communication:  Yes, patient's daughter and granddaughter at bed side Disposition Plan:  Anticipate discharge back to previous home environment Consults called:  none Admission status:   SDU/inpation       Date of Service 12/26/2016    Ivor Costa [facility] Pager [phone]  If 7PM-7AM, please contact night-coverage [email or web address] Password [email or web address] 12/26/2016, 3:55 AM

## 2016-12-25 NOTE — Addendum Note (Signed)
Addended by: Collier Salina on: 12/02/2016 10:54 AM   Modules accepted: Orders

## 2016-12-25 NOTE — Patient Instructions (Signed)
Stage 4 very severe COPD by GOLD classification ([place]) Stable without flare up OVerall functional status deteriorated   PLAN Deterioration in functional status is due to other issues - please discuss with pcp Marletta Lor, MD  Continue o2 and nebs Home hospice is an option given decline   Nodule of right lung  right lower lobe 7 mm lung nodule noted in August 2017 follow-up CT 10/09/2016: The nodule is now enlarged to 1 cm PET scan  Spring 2018 - indeterminate  Plan Repeat CT chest with contrast mid June 2018  - might have to consider empiric XRT referral depending on these results   - too high risk for biopsy or resection  Followup Mid June 2018 with APP or Dr Lamonte Sakai after CT chest

## 2016-12-25 NOTE — Telephone Encounter (Signed)
Pt has been scheduled to see Dr. Maudie Mercury this afternoon.   Dr. Maudie Mercury - FYI Thanks!

## 2016-12-26 ENCOUNTER — Inpatient Hospital Stay (HOSPITAL_COMMUNITY): Payer: Medicare HMO

## 2016-12-26 DIAGNOSIS — R7989 Other specified abnormal findings of blood chemistry: Secondary | ICD-10-CM

## 2016-12-26 DIAGNOSIS — A419 Sepsis, unspecified organism: Principal | ICD-10-CM

## 2016-12-26 DIAGNOSIS — J9611 Chronic respiratory failure with hypoxia: Secondary | ICD-10-CM

## 2016-12-26 DIAGNOSIS — M549 Dorsalgia, unspecified: Secondary | ICD-10-CM | POA: Diagnosis present

## 2016-12-26 DIAGNOSIS — R778 Other specified abnormalities of plasma proteins: Secondary | ICD-10-CM | POA: Diagnosis present

## 2016-12-26 DIAGNOSIS — J439 Emphysema, unspecified: Secondary | ICD-10-CM

## 2016-12-26 DIAGNOSIS — I340 Nonrheumatic mitral (valve) insufficiency: Secondary | ICD-10-CM

## 2016-12-26 DIAGNOSIS — D500 Iron deficiency anemia secondary to blood loss (chronic): Secondary | ICD-10-CM

## 2016-12-26 DIAGNOSIS — N179 Acute kidney failure, unspecified: Secondary | ICD-10-CM

## 2016-12-26 DIAGNOSIS — R748 Abnormal levels of other serum enzymes: Secondary | ICD-10-CM

## 2016-12-26 DIAGNOSIS — R109 Unspecified abdominal pain: Secondary | ICD-10-CM

## 2016-12-26 DIAGNOSIS — D649 Anemia, unspecified: Secondary | ICD-10-CM

## 2016-12-26 DIAGNOSIS — E875 Hyperkalemia: Secondary | ICD-10-CM

## 2016-12-26 LAB — MRSA PCR SCREENING: MRSA by PCR: NEGATIVE

## 2016-12-26 LAB — URINALYSIS, ROUTINE W REFLEX MICROSCOPIC
Bilirubin Urine: NEGATIVE
GLUCOSE, UA: NEGATIVE mg/dL
Ketones, ur: NEGATIVE mg/dL
Nitrite: NEGATIVE
PROTEIN: 100 mg/dL — AB
Specific Gravity, Urine: 1.018 (ref 1.005–1.030)
pH: 5 (ref 5.0–8.0)

## 2016-12-26 LAB — BASIC METABOLIC PANEL
ANION GAP: 10 (ref 5–15)
ANION GAP: 12 (ref 5–15)
ANION GAP: 10 (ref 5–15)
BUN: 57 mg/dL — ABNORMAL HIGH (ref 6–20)
BUN: 54 mg/dL — ABNORMAL HIGH (ref 6–20)
BUN: 55 mg/dL — ABNORMAL HIGH (ref 6–20)
CO2: 16 mmol/L — ABNORMAL LOW (ref 22–32)
CO2: 14 mmol/L — ABNORMAL LOW (ref 22–32)
CO2: 15 mmol/L — ABNORMAL LOW (ref 22–32)
Calcium: 7.5 mg/dL — ABNORMAL LOW (ref 8.9–10.3)
Calcium: 7.5 mg/dL — ABNORMAL LOW (ref 8.9–10.3)
Calcium: 7.2 mg/dL — ABNORMAL LOW (ref 8.9–10.3)
Chloride: 109 mmol/L (ref 101–111)
Chloride: 110 mmol/L (ref 101–111)
Chloride: 112 mmol/L — ABNORMAL HIGH (ref 101–111)
Creatinine, Ser: 3.06 mg/dL — ABNORMAL HIGH (ref 0.44–1.00)
Creatinine, Ser: 3.01 mg/dL — ABNORMAL HIGH (ref 0.44–1.00)
Creatinine, Ser: 2.99 mg/dL — ABNORMAL HIGH (ref 0.44–1.00)
GFR calc Af Amer: 16 mL/min — ABNORMAL LOW (ref >60)
GFR calc Af Amer: 17 mL/min — ABNORMAL LOW (ref >60)
GFR, EST AFRICAN AMERICAN: 17 mL/min — AB (ref >60)
GFR, EST NON AFRICAN AMERICAN: 14 mL/min — AB (ref >60)
GFR, EST NON AFRICAN AMERICAN: 14 mL/min — AB (ref >60)
GFR, EST NON AFRICAN AMERICAN: 15 mL/min — AB (ref >60)
Glucose, Bld: 89 mg/dL (ref 65–99)
Glucose, Bld: 123 mg/dL — ABNORMAL HIGH (ref 65–99)
Glucose, Bld: 177 mg/dL — ABNORMAL HIGH (ref 65–99)
POTASSIUM: 6.4 mmol/L — AB (ref 3.5–5.1)
POTASSIUM: 5.8 mmol/L — AB (ref 3.5–5.1)
POTASSIUM: 5.4 mmol/L — AB (ref 3.5–5.1)
SODIUM: 135 mmol/L (ref 135–145)
Sodium: 136 mmol/L (ref 135–145)
Sodium: 137 mmol/L (ref 135–145)

## 2016-12-26 LAB — CBC WITH DIFFERENTIAL/PLATELET
BASOS ABS: 0 10*3/uL (ref 0.0–0.1)
BASOS PCT: 0 %
BLASTS: 1 %
Basophils Absolute: 0 10*3/uL (ref 0.0–0.1)
Basophils Relative: 0 %
Blasts: 1 %
EOS ABS: 0 10*3/uL (ref 0.0–0.7)
EOS ABS: 0 10*3/uL (ref 0.0–0.7)
EOS PCT: 0 %
Eosinophils Relative: 0 %
HEMATOCRIT: 21.9 % — AB (ref 36.0–46.0)
HEMATOCRIT: 19.5 % — AB (ref 36.0–46.0)
HEMOGLOBIN: 6.6 g/dL — AB (ref 12.0–15.0)
Hemoglobin: 7.6 g/dL — ABNORMAL LOW (ref 12.0–15.0)
LYMPHS ABS: 0.9 10*3/uL (ref 0.7–4.0)
LYMPHS ABS: 0.3 10*3/uL — AB (ref 0.7–4.0)
Lymphocytes Relative: 6 %
Lymphocytes Relative: 3 %
MCH: 29 pg (ref 26.0–34.0)
MCH: 28.7 pg (ref 26.0–34.0)
MCHC: 34.7 g/dL (ref 30.0–36.0)
MCHC: 33.8 g/dL (ref 30.0–36.0)
MCV: 83.6 fL (ref 78.0–100.0)
MCV: 84.8 fL (ref 78.0–100.0)
MONO ABS: 2.2 10*3/uL — AB (ref 0.1–1.0)
MYELOCYTES: 3 %
Monocytes Absolute: 1.1 10*3/uL — ABNORMAL HIGH (ref 0.1–1.0)
Monocytes Relative: 15 %
Monocytes Relative: 10 %
NEUTROS ABS: 9.5 10*3/uL — AB (ref 1.7–7.7)
NEUTROS PCT: 83 %
Neutro Abs: 11.3 10*3/uL — ABNORMAL HIGH (ref 1.7–7.7)
Neutrophils Relative %: 77 %
PROMYELOCYTES ABS: 1 %
Platelets: 122 10*3/uL — ABNORMAL LOW (ref 150–400)
Platelets: 101 10*3/uL — ABNORMAL LOW (ref 150–400)
RBC: 2.62 MIL/uL — AB (ref 3.87–5.11)
RBC: 2.3 MIL/uL — ABNORMAL LOW (ref 3.87–5.11)
RDW: 19.1 % — AB (ref 11.5–15.5)
RDW: 19.7 % — ABNORMAL HIGH (ref 11.5–15.5)
WBC: 14.5 10*3/uL — ABNORMAL HIGH (ref 4.0–10.5)
WBC: 11.1 10*3/uL — ABNORMAL HIGH (ref 4.0–10.5)

## 2016-12-26 LAB — DIC (DISSEMINATED INTRAVASCULAR COAGULATION) PANEL
APTT: 40 s — AB (ref 24–36)
FIBRINOGEN: 580 mg/dL — AB (ref 210–475)

## 2016-12-26 LAB — LIPID PANEL
CHOL/HDL RATIO: 4.6 ratio
Cholesterol: 65 mg/dL (ref 0–200)
HDL: 14 mg/dL — AB (ref >40)
LDL CALC: 30 mg/dL (ref 0–99)
Triglycerides: 103 mg/dL (ref <150)
VLDL: 21 mg/dL (ref 0–40)

## 2016-12-26 LAB — PATHOLOGIST SMEAR REVIEW

## 2016-12-26 LAB — DIC (DISSEMINATED INTRAVASCULAR COAGULATION)PANEL
D-Dimer, Quant: 14.65 ug/mL-FEU — ABNORMAL HIGH (ref 0.00–0.50)
INR: 1.38
Platelets: 69 10*3/uL — ABNORMAL LOW (ref 150–400)
Prothrombin Time: 17.1 seconds — ABNORMAL HIGH (ref 11.4–15.2)

## 2016-12-26 LAB — CREATININE, URINE, RANDOM
CREATININE, URINE: 101.17 mg/dL
Creatinine, Urine: 130.06 mg/dL

## 2016-12-26 LAB — SODIUM, URINE, RANDOM
SODIUM UR: 55 mmol/L
Sodium, Ur: 37 mmol/L

## 2016-12-26 LAB — C-REACTIVE PROTEIN: CRP: 25.2 mg/dL — AB (ref <1.0)

## 2016-12-26 LAB — ECHOCARDIOGRAM COMPLETE
HEIGHTINCHES: 63 in
Weight: 2080 oz

## 2016-12-26 LAB — URIC ACID: URIC ACID, SERUM: 7.6 mg/dL — AB (ref 2.3–6.6)

## 2016-12-26 LAB — CK: Total CK: 34 U/L — ABNORMAL LOW (ref 38–234)

## 2016-12-26 LAB — PROCALCITONIN: PROCALCITONIN: 2.59 ng/mL

## 2016-12-26 LAB — TROPONIN I
Troponin I: 0.09 ng/mL (ref <0.03)
Troponin I: 0.08 ng/mL (ref <0.03)

## 2016-12-26 LAB — OCCULT BLOOD X 1 CARD TO LAB, STOOL: Fecal Occult Bld: NEGATIVE

## 2016-12-26 LAB — LACTIC ACID, PLASMA: Lactic Acid, Venous: 1.4 mmol/L (ref 0.5–1.9)

## 2016-12-26 LAB — LACTATE DEHYDROGENASE
LDH: 188 U/L (ref 98–192)
LDH: 344 U/L — ABNORMAL HIGH (ref 98–192)

## 2016-12-26 LAB — GLUCOSE, CAPILLARY: GLUCOSE-CAPILLARY: 122 mg/dL — AB (ref 65–99)

## 2016-12-26 MED ORDER — IPRATROPIUM-ALBUTEROL 0.5-2.5 (3) MG/3ML IN SOLN
3.0000 mL | Freq: Four times a day (QID) | RESPIRATORY_TRACT | Status: DC
Start: 2016-12-26 — End: 2016-12-28
  Administered 2016-12-26 – 2016-12-27 (×7): 3 mL via RESPIRATORY_TRACT
  Filled 2016-12-26 (×7): qty 3

## 2016-12-26 MED ORDER — PREDNISONE 10 MG PO TABS
10.0000 mg | ORAL_TABLET | Freq: Every day | ORAL | Status: DC
  Administered 2016-12-26 – 2016-12-27 (×2): 10 mg via ORAL
  Filled 2016-12-26 (×2): qty 1

## 2016-12-26 MED ORDER — SODIUM CHLORIDE 0.9 % IV BOLUS (SEPSIS)
1000.0000 mL | Freq: Once | INTRAVENOUS | Status: AC
  Administered 2016-12-26: 1000 mL via INTRAVENOUS

## 2016-12-26 MED ORDER — ASPIRIN 325 MG PO TABS
325.0000 mg | ORAL_TABLET | Freq: Every day | ORAL | Status: DC
  Administered 2016-12-26: 325 mg via ORAL
  Filled 2016-12-26: qty 1

## 2016-12-26 MED ORDER — SODIUM POLYSTYRENE SULFONATE 15 GM/60ML PO SUSP
30.0000 g | Freq: Once | ORAL | Status: AC
  Administered 2016-12-26: 30 g via ORAL
  Filled 2016-12-26: qty 120

## 2016-12-26 MED ORDER — PANTOPRAZOLE SODIUM 40 MG IV SOLR
40.0000 mg | Freq: Every day | INTRAVENOUS | Status: DC
  Administered 2016-12-26 – 2016-12-27 (×2): 40 mg via INTRAVENOUS
  Filled 2016-12-26 (×2): qty 40

## 2016-12-26 MED ORDER — SODIUM CHLORIDE 0.9 % IV SOLN
Freq: Once | INTRAVENOUS | Status: AC
  Administered 2016-12-26: 17:00:00 via INTRAVENOUS

## 2016-12-26 MED ORDER — SODIUM BICARBONATE 8.4 % IV SOLN
INTRAVENOUS | Status: DC
  Filled 2016-12-26: qty 50

## 2016-12-26 MED ORDER — ORAL CARE MOUTH RINSE
15.0000 mL | Freq: Two times a day (BID) | OROMUCOSAL | Status: DC
  Administered 2016-12-26 – 2016-12-27 (×3): 15 mL via OROMUCOSAL

## 2016-12-26 MED ORDER — SODIUM BICARBONATE 8.4 % IV SOLN
INTRAVENOUS | Status: DC
  Administered 2016-12-26 – 2016-12-27 (×2): via INTRAVENOUS
  Filled 2016-12-26 (×2): qty 150

## 2016-12-26 MED ORDER — DEXTROSE 5 % IV SOLN
INTRAVENOUS | Status: DC
  Administered 2016-12-26: 06:00:00 via INTRAVENOUS
  Filled 2016-12-26: qty 150

## 2016-12-26 MED ORDER — PHENYLEPHRINE HCL-NACL 10-0.9 MG/250ML-% IV SOLN
0.0000 ug/min | INTRAVENOUS | Status: DC
  Administered 2016-12-26: 30 ug/min via INTRAVENOUS
  Administered 2016-12-26: 20 ug/min via INTRAVENOUS
  Administered 2016-12-26: 30 ug/min via INTRAVENOUS
  Administered 2016-12-27: 40 ug/min via INTRAVENOUS
  Administered 2016-12-27: 20 ug/min via INTRAVENOUS
  Administered 2016-12-27: 30 ug/min via INTRAVENOUS
  Administered 2016-12-27: 40 ug/min via INTRAVENOUS
  Administered 2016-12-27: 20 ug/min via INTRAVENOUS
  Administered 2016-12-28: 45 ug/min via INTRAVENOUS
  Filled 2016-12-26 (×6): qty 250

## 2016-12-26 NOTE — Progress Notes (Signed)
CRITICAL VALUE ALERT  Critical Value:  Hgb 6.6, Troponin 0.09  Date & Time Notied:  12/26/16 0828  Provider Notified: PCCM  Orders Received/Actions taken: MD notified at bedside

## 2016-12-26 NOTE — Consult Note (Signed)
PULMONARY / CRITICAL CARE MEDICINE   Name: Pamela Hampton MRN: [id number] DOB: [birth year]    ADMISSION DATE:  12/23/2016 CONSULTATION DATE:  12/07/2016  REFERRING MD:  Dr. Blaine Hamper  CHIEF COMPLAINT:  Hypotension  HISTORY OF PRESENT ILLNESS:   76 year old female with PMH as below, which is significant for COPD on 4L O2, HTN, hiatal hernia, vaginal cancer, and hepatitis. She has been experiencing nausea and vomiting for several weeks by report 3-4 times per day and described poor appetite for almost a year now. She presented to pulmonary clinic 5/31 with complaints of generalized pain, SOB, and cough. Dr. Chase Caller referred her to PCP for overall decline and felt that pulmonary problems were stable. Offered home hospice as well. She presented to her PCP office with these complaints and requested ambulance to be transported to ER.   In ER she was found to by hypotensive which responded to IVF. Laboratory eval significant for WBC 14.5, hemoglobin 7.6 which was 9.6 on 12/05/16, repeat 122, lactic acid to 1.69, potassium 7.2, 6.4, bicarbonate 17, acute renal injury with creatinine 3.19. She underwent CT scan of her abdomen due to vomiting and was found to have findings consistent with mild enteritis and a large hiatal hernia.   PAST MEDICAL HISTORY :  She  has a past medical history of Allergy; Anemia; Arthritis; Barrett's esophagus (07/19/2004); Blood transfusion; COPD (chronic obstructive pulmonary disease) ([place]); Depression; Emphysema of lung ([place]); GERD (gastroesophageal reflux disease); Hepatitis; Hiatal hernia; History of oxygen administration; Hyperlipidemia; Hypertension; Impaired hearing; Obesity; Osteoporosis; Pneumonia; Seizures ([place]); Sleep apnea; Stroke [facility]); Ulcer; and Vaginal cancer ([place]).  PAST SURGICAL HISTORY: She  has a past surgical history that includes ORIF hip fracture (Right); Cholecystectomy; Hemorrhoid surgery; Total hip arthroplasty (Right); Abdominal hysterectomy; vaginal wall  cancer--?melanoma (1998); abdominal laproscopy surgery; Joint replacement; and Esophagogastroduodenoscopy (egd) with propofol (N/A, 12/21/2014).  No Known Allergies  No current facility-administered medications on file prior to encounter.    Current Outpatient Prescriptions on File Prior to Encounter  Medication Sig  . albuterol (PROVENTIL) (2.5 MG/3ML) 0.083% nebulizer solution INHALE THE CONTENTS OF 1 VIAL VIA NEBULIZER 4 TIMES A DAY.  . beclomethasone (QVAR) 80 MCG/ACT inhaler Inhale 2 puffs into the lungs 2 (two) times daily.  Marland Kitchen DALIRESP 500 MCG TABS tablet TAKE ONE TABLET BY MOUTH ONCE DAILY  . escitalopram (LEXAPRO) 10 MG tablet TAKE ONE TABLET BY MOUTH ONCE DAILY  . fluticasone (FLONASE) 50 MCG/ACT nasal spray PLACE 2 SPRAYS INTO BOTH NOSTRILS DAILY.  Marland Kitchen ibandronate (BONIVA) 150 MG tablet take 1 tablet by mouth in the morning on an empty stomach once monthly  . ipratropium (ATROVENT) 0.02 % nebulizer solution INHALE THE CONTENTS OF 1 VIAL VIA NEBULIZER 4 TIMES A DAY.  Marland Kitchen omeprazole (PRILOSEC) 20 MG capsule TAKE ONE CAPSULE BY MOUTH ONCE DAILY  . OXYGEN Inhale 4 L/min into the lungs continuous.  . potassium chloride SA (K-DUR,KLOR-CON) 20 MEQ tablet TAKE ONE TABLET BY MOUTH ONCE DAILY  . rosuvastatin (CRESTOR) 10 MG tablet TAKE ONE TABLET BY MOUTH ONCE DAILY  . traZODone (DESYREL) 100 MG tablet Take 1 tablet (100 mg total) by mouth at bedtime.  Marland Kitchen trimethoprim (TRIMPEX) 100 MG tablet Take 100 mg by mouth daily.  . ondansetron (ZOFRAN) 4 MG tablet Take 1 tablet (4 mg total) by mouth every 8 (eight) hours as needed for nausea or vomiting.  . polyethylene glycol powder (GLYCOLAX/MIRALAX) powder Take 17 g by mouth 2 (two) times daily as needed. (Patient taking differently: Take  17 g by mouth daily as needed for mild constipation. )  . PROAIR HFA 108 (90 Base) MCG/ACT inhaler inhale TWO PUFFS BY MOUTH EVERY 4 HOURS AS NEEDED for SHORTNESS OF BREATH    FAMILY HISTORY:  Her indicated that her  mother is deceased. She indicated that her father is deceased. She indicated that the status of her neg hx is unknown.    SOCIAL HISTORY: She  reports that she quit smoking about 14 years ago. Her smoking use included Cigarettes. She has a 60.00 pack-year smoking history. She has never used smokeless tobacco. She reports that she does not drink alcohol or use drugs.  REVIEW OF SYSTEMS:   Bolds are positive  Constitutional: weight loss, gain, night sweats, Fevers, chills, fatigue .  HEENT: headaches, Sore throat, sneezing, nasal congestion, post nasal drip, Difficulty swallowing, Tooth/dental problems, visual complaints visual changes, ear ache CV:  chest pain, radiates:,Orthopnea, PND, swelling in lower extremities, dizziness, palpitations, syncope.  GI  heartburn, indigestion, abdominal pain, nausea, vomiting, diarrhea, change in bowel habits, loss of appetite, bloody stools.  Resp: cough, productive: , hemoptysis, dyspnea, chest pain, pleuritic.  Skin: rash or itching or icterus GU: dysuria, change in color of urine, urgency or frequency. flank pain, hematuria  MS: joint pain or swelling. decreased range of motion pain all over. Mainly chest and back. Worse with movement. Psych: change in mood or affect. depression or anxiety.  Neuro: difficulty with speech, weakness, numbness, ataxia    SUBJECTIVE:    VITAL SIGNS: BP (!) 82/56   Pulse 100   Temp 97.4 F (36.3 C) (Oral)   Resp (!) 22   Ht 5\' 3"  (1.6 m)   Wt 59 kg (130 lb)   SpO2 98%   BMI 23.03 kg/m   HEMODYNAMICS:    VENTILATOR SETTINGS:    INTAKE / OUTPUT: No intake/output data recorded.  PHYSICAL EXAMINATION: General:  Frail elderly female in NAD Neuro:  Alert, oriented, non-focal. Very hard of hearing HEENT:  [place]/AT, PERRL, no JVD Cardiovascular:  RRR, no MRG Lungs:  Coarse bilateral breath sounds. Able to speak full sentences, but not without extra respiratory effort. Abdomen:  Soft,  non-tender Musculoskeletal:  No acute deformity or ROM limitation Skin:  Two small areas of sacral skin breakdown.   LABS:  BMET  Recent Labs Lab 12/18/2016 1112 12/04/2016 1859 12/12/2016 1958 11/25/2016 2326  NA 131* 131* 134* 135  K 5.9* 7.2* 6.4* 6.4*  CL 101 105 107 109  CO2 19  --  17* 16*  BUN 54* 82* 58* 57*  CREATININE 3.02* 3.20* 3.19* 3.06*  GLUCOSE 132* 85 89 89    Electrolytes  Recent Labs Lab 12/03/2016 1112 12/16/2016 1958 12/10/2016 2326  CALCIUM 8.4 7.6* 7.5*    CBC  Recent Labs Lab 12/07/2016 1859 12/19/2016 1958  WBC  --  14.5*  HGB 17.7* 7.6*  HCT 52.0* 21.9*  PLT  --  122*    Coag's No results for input(s): APTT, INR in the last 168 hours.  Sepsis Markers  Recent Labs Lab 12/04/2016 1859  LATICACIDVEN 1.69    ABG No results for input(s): PHART, PCO2ART, PO2ART in the last 168 hours.  Liver Enzymes  Recent Labs Lab 12/15/2016 1958  AST 25  ALT 19  ALKPHOS 229*  BILITOT 0.6  ALBUMIN 1.8*    Cardiac Enzymes No results for input(s): TROPONINI, PROBNP in the last 168 hours.  Glucose No results for input(s): GLUCAP in the last 168 hours.  Imaging Ct  Abdomen Pelvis Wo Contrast  Result Date: 12/22/2016 CLINICAL DATA:  Nausea vomiting and hypotension EXAM: CT ABDOMEN AND PELVIS WITHOUT CONTRAST TECHNIQUE: Multidetector CT imaging of the abdomen and pelvis was performed following the standard protocol without IV contrast. COMPARISON:  11/12/2016, PET-CT 10/30/2016, CT abdomen pelvis 01/16/2005 FINDINGS: Lower chest: Lung bases demonstrate no acute consolidation or pleural effusion. Large hiatal hernia. The heart is slightly enlarged. There are coronary artery calcifications. Hepatobiliary: No intra hepatic biliary dilatation. Surgical clips in the gallbladder fossa. Stable enlarged extrahepatic common bile duct. Pancreas: Unremarkable. No pancreatic ductal dilatation or surrounding inflammatory changes. Spleen: Small splenic tissue in the left  upper quadrant. Adrenals/Urinary Tract: Adrenal glands are within normal limits. Nonspecific perinephric fat stranding. Prominent renal pelvises bilaterally without ureteral stone. Small exophytic lesion mid right kidney. Small stones versus intrarenal vascular calcifications within both kidneys. The bladder is unremarkable. Stomach/Bowel: Stomach nonenlarged. No dilated small bowel. None questionable mild wall thickening involving the cecum, and ascending colon with minimal haziness in the adjacent pericolonic fat. Appendix not well visualized. Remainder of the colon shows no focal wall thickening. Sigmoid colon diverticula. Vascular/Lymphatic: Extensive vascular calcifications. Few nonspecific lymph nodes within the hiatal hernia fat. No significantly enlarged pelvic lymph nodes. Reproductive: Status post hysterectomy. No adnexal masses. Other: Soft tissue density with scattered calcifications extending to the skin, posterior to the left inferior sacrum as before. This measures up to 5 cm. A fat density mass was noted in the region on 2006 CT. No free air or free fluid. Musculoskeletal: Scoliosis of the spine. Status post right hip replacement with artifact and limiting factors. IMPRESSION: 1. Questionable mild wall thickening involving the cecum and ascending colon, difficult to exclude a mild colitis or enteritis. No evidence for a bowel obstruction. The remainder of the colon is within normal limits. 2. Post cholecystectomy with enlarged extrahepatic common bowel duct, likely due to post cholecystectomy change 3. Stable soft tissue mass with scattered calcifications posterior to the sacrum. Electronically Signed   By: Donavan Foil M.D.   On: 12/09/2016 21:11   Dg Chest Port 1 View  Result Date: 12/22/2016 CLINICAL DATA:  76 year old presenting with acute onset of shortness of breath. EXAM: PORTABLE CHEST 1 VIEW COMPARISON:  12/05/2016, 11/06/2014 and earlier, including CT chest 10/09/2016 and earlier.  PET-CT 10/30/2016. FINDINGS: Suboptimal inspiration accounts for crowded bronchovascular markings diffusely and atelectasis in the bases, and accentuates the cardiac silhouette. Taking this into account, cardiac silhouette upper normal in size to slightly enlarged but stable. The previously identified large hiatal hernia is less conspicuous due to technique. Lungs otherwise clear. Pulmonary vascularity normal. No visible pleural effusions. IMPRESSION: Suboptimal inspiration accounts for bibasilar atelectasis. No acute cardiopulmonary disease otherwise. Stable borderline heart size. Electronically Signed   By: Evangeline Dakin M.D.   On: 12/08/2016 18:23     STUDIES:  Ct abd 5/31 > Questionable mild wall thickening involving the cecum and ascending colon, difficult to exclude a mild colitis or enteritis. No evidence for a bowel obstruction. The remainder of the colon is within normal limits. Post cholecystectomy with enlarged extrahepatic common bowel duct, likely due to post cholecystectomy change. Stable soft tissue mass with scattered calcifications posterior to the sacrum.  CULTURES: Blood 5/31 > Urine 5/31 > Sputum 5/31 >  ANTIBIOTICS: Ciprofloxacin 5/31 > Metronidazole 5/31 >  SIGNIFICANT EVENTS: 5/31 Admit  LINES/TUBES:   DISCUSSION: 76 year old female with chronic respiratory failure due to advanced COPD. Admitted for suspected gastroenteritis with am months worth of vomiting  and poor PO intake. She was hypotensive in the ED which initially responded to volume, but eventually her BP dropped again and PCCM consulted.   ASSESSMENT / PLAN:  PULMONARY A: Chronic hypoxemic respiratory failure End stage COPD on 4L Possible bronchitis  P:   Continue supplemental O2 4 L Continue home nebs (albuterol/atrovent) Continue home QVAR Continue home daliresp as tolerated Pulmonary hygeine  CARDIOVASCULAR A:  Hypotension: hypovolemic vs septic. S/p 3L IVF. Lactic wnl. Mental status  intact. SBP 80s Elevated tropoinin H/o HTN, HLD  P:  Telemetry monitoring Continue aggressive IVF resuscitation Albumin per primary Will start phenylephrine infusion for MAP goal 50mmHg No need to repeat lactic acid. Trend troponin Echo  RENAL A:   Hyperkalemia AKI NAG acidosis  P:   S/p Insulin, dextrose, Kayexalate, lasix Continued IVF Follow BMP Start bicarb infusion FeNa pending  GASTROINTESTINAL A:   Protein calorie malnutrition Large hiatal hernia  P:   Consider GI consult for hernia likely contributing to vomiting.  NPO PRN zofran Protonix for SUP  DC PO meds  HEMATOLOGIC A:   Anemia (baseline hgb 10.5) "Blood cell abnormality" - pending hematologist review.  P:  Follow CBC Transfuse for Hgb < 7 LDH, smear pending.   INFECTIOUS A:   Enteritis vs colitis Possible bronchitis  P:   Continue cipro, flagyl per primary Follow cultures, add sputum Follow WBC and fever curve  ENDOCRINE A:   No acute issues  P:   Follow glucose on BMP  NEUROLOGIC A:   No acute issues  P:   RASS goal: 0 Monitor  GLOBAL: Failure to thrive. They have declined hospice 5/31 at PCP office. Perhaps value in palliative care consult  FAMILY  - Updates:   - Inter-disciplinary family meet or Palliative Care meeting due by:  6/6   Georgann Housekeeper, AGACNP-BC [place] [facility] Pager [phone] or [phone]  12/26/2016 5:20 AM    ATTENDING NOTE / ATTESTATION NOTE :   I have discussed the case with the resident/APP  Georgann Housekeeper NP  I agree with the resident/APP's  history, physical examination, assessment, and plans.    I have edited the above note and modified it according to our agreed history, physical examination, assessment and plan.   Briefly, 76 year old female with PMH  significant for COPD on 4L O2, HTN, hiatal hernia, vaginal cancer, and hepatitis. She has been experiencing nausea and vomiting for several weeks by report 3-4  times per day and described poor appetite for almost a year now. She presented to pulmonary clinic 5/31 with complaints of generalized pain, SOB, and cough. Dr. Chase Caller referred her to PCP for overall decline and felt that pulmonary problems were stable. Offered home hospice as well. She presented to her PCP office with these complaints and requested ambulance to be transported to ER.   In ER she was found to by hypotensive which responded to IVF. Laboratory eval significant for WBC 14.5, hemoglobin 7.6 which was 9.6 on 12/05/16, repeat 122, lactic acid to 1.69, potassium 7.2, 6.4, bicarbonate 17, acute renal injury with creatinine 3.19. She underwent CT scan of her abdomen due to vomiting and was found to have findings consistent with mild enteritis and a large hiatal hernia.   She feels better since coming in. BP still on the soft side and neosynpehrine is being weaned off. SOB is at baseline. Less abd pain.    Vitals:  Vitals:   12/26/16 0730 12/26/16 0745 12/26/16 0800 12/26/16 0909  BP: Marland Kitchen)  87/37 (!) 93/45 (!) 115/56   Pulse: 88 96 91 87  Resp: 19 19 19 18   Temp:   97.4 F (36.3 C)   TempSrc:   Oral   SpO2: 98% 99% 95% 95%  Weight:      Height:        Constitutional/General: well-nourished, well-developed, not in any distress  Body mass index is 23.03 kg/m. Wt Readings from Last 3 Encounters:  11/25/2016 59 kg (130 lb)  12/03/2016 59 kg (130 lb)  12/19/2016 59 kg (130 lb)    HEENT: PERLA, anicteric sclerae. (-) Oral thrush.  Neck: No masses. Midline trachea. No JVD, (-) LAD. (-) bruits appreciated.  Respiratory/Chest: Grossly normal chest. (-) deformity. (-) Accessory muscle use.  Symmetric expansion. Diminished BS on both lower lung zones. (-) wheezing, rhonchi Some crackles at bases (-) egophony  Cardiovascular: Regular rate and  rhythm, heart sounds normal, no murmur or gallops,  Trace peripheral edema  Gastrointestinal:  Normal bowel sounds. Soft, non-tender. No  hepatosplenomegaly.  (-) masses.   Musculoskeletal:  Normal muscle tone.   Extremities: Grossly normal. (-) clubbing, cyanosis.  (-) edema  Skin: (-) rash,lesions seen.   Neurological/Psychiatric :  CN grossly intact. (-) lateralizing signs.     CBC Recent Labs     12/17/2016  1859  12/13/2016  1958  12/26/16  0701  WBC   --   14.5*  11.1*  HGB  17.7*  7.6*  6.6*  HCT  52.0*  21.9*  19.5*  PLT   --   122*  101*    Coag's No results for input(s): APTT, INR in the last 72 hours.  BMET Recent Labs     12/10/2016  1958  11/30/2016  2326  12/26/16  0701  NA  134*  135  136  K  6.4*  6.4*  5.8*  CL  107  109  110  CO2  17*  16*  14*  BUN  58*  57*  54*  CREATININE  3.19*  3.06*  3.01*  GLUCOSE  89  89  123*    Electrolytes Recent Labs     12/08/2016  1958  12/06/2016  2326  12/26/16  0701  CALCIUM  7.6*  7.5*  7.5*    Sepsis Markers Recent Labs     12/26/16  0701  PROCALCITON  2.59    ABG No results for input(s): PHART, PCO2ART, PO2ART in the last 72 hours.  Liver Enzymes Recent Labs     12/24/2016  1958  AST  25  ALT  19  ALKPHOS  229*  BILITOT  0.6  ALBUMIN  1.8*    Cardiac Enzymes Recent Labs     12/26/16  0701  TROPONINI  0.09*    Glucose Recent Labs     12/26/16  0742  GLUCAP  122*    Imaging Ct Abdomen Pelvis Wo Contrast  Result Date: 12/10/2016 CLINICAL DATA:  Nausea vomiting and hypotension EXAM: CT ABDOMEN AND PELVIS WITHOUT CONTRAST TECHNIQUE: Multidetector CT imaging of the abdomen and pelvis was performed following the standard protocol without IV contrast. COMPARISON:  11/12/2016, PET-CT 10/30/2016, CT abdomen pelvis 01/16/2005 FINDINGS: Lower chest: Lung bases demonstrate no acute consolidation or pleural effusion. Large hiatal hernia. The heart is slightly enlarged. There are coronary artery calcifications. Hepatobiliary: No intra hepatic biliary dilatation. Surgical clips in the gallbladder fossa. Stable enlarged extrahepatic  common bile duct. Pancreas: Unremarkable. No pancreatic ductal dilatation or surrounding inflammatory changes. Spleen: Small splenic  tissue in the left upper quadrant. Adrenals/Urinary Tract: Adrenal glands are within normal limits. Nonspecific perinephric fat stranding. Prominent renal pelvises bilaterally without ureteral stone. Small exophytic lesion mid right kidney. Small stones versus intrarenal vascular calcifications within both kidneys. The bladder is unremarkable. Stomach/Bowel: Stomach nonenlarged. No dilated small bowel. None questionable mild wall thickening involving the cecum, and ascending colon with minimal haziness in the adjacent pericolonic fat. Appendix not well visualized. Remainder of the colon shows no focal wall thickening. Sigmoid colon diverticula. Vascular/Lymphatic: Extensive vascular calcifications. Few nonspecific lymph nodes within the hiatal hernia fat. No significantly enlarged pelvic lymph nodes. Reproductive: Status post hysterectomy. No adnexal masses. Other: Soft tissue density with scattered calcifications extending to the skin, posterior to the left inferior sacrum as before. This measures up to 5 cm. A fat density mass was noted in the region on 2006 CT. No free air or free fluid. Musculoskeletal: Scoliosis of the spine. Status post right hip replacement with artifact and limiting factors. IMPRESSION: 1. Questionable mild wall thickening involving the cecum and ascending colon, difficult to exclude a mild colitis or enteritis. No evidence for a bowel obstruction. The remainder of the colon is within normal limits. 2. Post cholecystectomy with enlarged extrahepatic common bowel duct, likely due to post cholecystectomy change 3. Stable soft tissue mass with scattered calcifications posterior to the sacrum. Electronically Signed   By: Donavan Foil M.D.   On: 12/12/2016 21:11   Dg Chest Port 1 View  Result Date: 12/21/2016 CLINICAL DATA:  76 year old presenting with acute  onset of shortness of breath. EXAM: PORTABLE CHEST 1 VIEW COMPARISON:  12/05/2016, 11/06/2014 and earlier, including CT chest 10/09/2016 and earlier. PET-CT 10/30/2016. FINDINGS: Suboptimal inspiration accounts for crowded bronchovascular markings diffusely and atelectasis in the bases, and accentuates the cardiac silhouette. Taking this into account, cardiac silhouette upper normal in size to slightly enlarged but stable. The previously identified large hiatal hernia is less conspicuous due to technique. Lungs otherwise clear. Pulmonary vascularity normal. No visible pleural effusions. IMPRESSION: Suboptimal inspiration accounts for bibasilar atelectasis. No acute cardiopulmonary disease otherwise. Stable borderline heart size. Electronically Signed   By: Evangeline Dakin M.D.   On: 12/01/2016 18:23    Assessment/Plan : Hypotension 2/2 Hypovolemia + Poor PO intake + Diarrhea - pt is 4.5 L (+). She is adequately resuscitated. She is currently on Neo-Synephrine drip which we are weaning off. I was told, map goal of 60 mmHg. Agree with that. - Wean off Neo-Synephrine drip. - Patient to receive 1 unit packed red blood cells. - We'll decrease her bicarbonate drip to 50 mls  an hour. WOF congestion.  - diet as tolerated   Enteritis - cont Cipro and Flagyl for now   AKI 2/2 Hypovolemia + Poor PO intake. Hyperkalemia - Patient has been adequately resuscitated. 4L (+). Currently on bicarbonate drip. - Observe creatinine. Observe urine output. - Suggest renal consult if hyperkalemia or decreased urine output is persistent   Chronic Hypoxemic Resp failure. COPD not in exacerbation - She is currently on 4 L oxygen which is her baseline. - Cont DuoNeb 4 times a day. Continue Pulmicort via nebulizer twice a day.   I spent  30  minutes of Critical Care time with this patient today. This is my time spent independent of the APP or resident.   Family   No family at bedside. Patient updated of plans  at  bedside.   Monica Becton, MD 12/26/2016, 10:23 AM [facility] Pager [phone] After 3 pm or if no answer, call [phone]

## 2016-12-26 NOTE — Procedures (Signed)
Central Venous Catheter Insertion Procedure Note Pamela Hampton [id number] 1940-11-03  Procedure: Insertion of Central Venous Catheter Indications: Assessment of intravascular volume, Drug and/or fluid administration and Frequent blood sampling  Procedure Details Consent: Risks of procedure as well as the alternatives and risks of each were explained to the (patient/caregiver).  Consent for procedure obtained. Time Out: Verified patient identification, verified procedure, site/side was marked, verified correct patient position, special equipment/implants available, medications/allergies/relevent history reviewed, required imaging and test results available.  Performed  Maximum sterile technique was used including antiseptics, cap, gloves, gown, hand hygiene, mask and sheet. Skin prep: Chlorhexidine; local anesthetic administered A antimicrobial bonded/coated triple lumen catheter was placed in the right internal jugular vein using the Seldinger technique.  Biopatch applied.  Sutured at 15 cm.  Evaluation Blood flow good Complications: No apparent complications Patient did tolerate procedure well. Chest X-ray ordered to verify placement.  CXR: pending.  Procedure performed with ultrasound guidance for real time vessel cannulation.     Kennieth Rad, AGACNP-BC [facility] Pgr: [phone] or if no answer [phone] 12/26/2016, 7:18 PM

## 2016-12-26 NOTE — Progress Notes (Signed)
Patient ID: Pamela Hampton, female   DOB: [birth year], 76 y.o.   MRN: [id number] I am concerned patient may have typical HUS (assoc with E coli enteritis).  Rec getting Hematology involved. Cannot explain normal LDH, will repeat.

## 2016-12-26 NOTE — Consult Note (Signed)
[place] CONSULT NOTE  Patient Care Team: Marletta Lor, MD as PCP - General (Internal Medicine)  CHIEF COMPLAINTS/PURPOSE OF CONSULTATION:  Severe anemia, acute renal failure, for further management  HISTORY OF PRESENTING ILLNESS:  Pamela Hampton 76 y.o. female is known to me.  I saw her a year ago as an inpatient consult for severe iron deficiency anemia but was never able to get hold of her to set up outpatient follow-up appointment. At that time in 2017, she had acute on chronic renal failure secondary to GI bleed, coagulopathy due to poor oral intake and acute on chronic renal failure. 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  She has mild anemia with hemoglobin of 9.6 and normal platelet count She presented to the emergency department due to significant neck pain.  In the emergency department, she underwent CT scan angiogram of the head and neck for evaluation.  She received IV contrast at that time.  Yesterday, she presented to the emergency department with generalized abdominal pain, nausea and vomiting with signs of acute renal failure, severe hyperkalemia and elevated troponin, along with severe anemia, leukocytosis and mild thrombocytopenia.  Apparently, this has been ongoing since her last ER visit. With aggressive IV fluid resuscitation, her hemoglobin dropped further with mild associated worsening thrombocytopenia. CT scan of the abdomen show possible mild thickening of the cecum The patient was admitted to the ICU for treatment  Today, she complained of chills.  She denies abdominal pain or nausea. Blood transfusion has been arranged  MEDICAL HISTORY:  Past Medical History:  Diagnosis Date  . Allergy    Rhinitis  . Anemia   . Arthritis   . Barrett's esophagus 07/19/2004  . Blood transfusion   .  COPD (chronic obstructive pulmonary disease) (HCC)    wears 3 liters of o2 continious  . Depression   . Emphysema of lung ([place])   . GERD (gastroesophageal reflux disease)    Barrett's esophagus  . Hepatitis   . Hiatal hernia   . History of oxygen administration    oxygen concentrator @ 4 l/m nasally 24/ 7  . Hyperlipidemia   . Hypertension   . Impaired hearing   . Obesity   . Osteoporosis   . Pneumonia   . Seizures ([place])   . Sleep apnea   . Stroke ([place])   . Ulcer   . Vaginal cancer ([place])    radiation implant '97. radiation. chemo.    SURGICAL HISTORY: Past Surgical History:  Procedure Laterality Date  . ABDOMINAL HYSTERECTOMY    . abdominal laproscopy surgery    . CHOLECYSTECTOMY    . ESOPHAGOGASTRODUODENOSCOPY (EGD) WITH PROPOFOL N/A 12/21/2014   Procedure: ESOPHAGOGASTRODUODENOSCOPY (EGD) WITH PROPOFOL;  Surgeon: Inda Castle, MD;  Location: [facility];  Service: Endoscopy;  Laterality: N/A;  . HEMORRHOID SURGERY    . JOINT REPLACEMENT    . ORIF HIP FRACTURE Right   . TOTAL HIP ARTHROPLASTY Right   . 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 Marital status: Divorced    Spouse name: N/A  . Number of children: 4  . Years of education: N/A   Occupational History  . disaled Retired   Social History Main Topics  . Smoking status: Former  Smoker    Packs/day: 2.00    Years: 30.00    Types: Cigarettes    Quit date: 07/28/2002  . Smokeless tobacco: Never Used  . Alcohol use No  . Drug use: No  . Sexual activity: Not Currently   Other Topics Concern  . Not on file   Social History Narrative  . No narrative on file    FAMILY HISTORY: Family History  Problem Relation Age of Onset  . Heart disease Mother        MI  . Diabetes Mother   . Uterine cancer Mother   . Anemia Mother   . Hypertension Mother   . Stomach cancer Father   . Esophageal  cancer Father   . Hypertension Father   . Diabetes Sister   . Anemia Sister   . Esophageal cancer Brother   . Hypertension Brother   . Diabetes Son   . Diabetes Son   . Brain cancer Brother   . Lung cancer Sister   . Liver cancer Sister   . Colon cancer Neg Hx   . Rectal cancer Neg Hx     ALLERGIES:  has No Known Allergies.  MEDICATIONS:  Current Facility-Administered Medications  Medication Dose Route Frequency Provider Last Rate Last Dose  . acetaminophen (TYLENOL) tablet 650 mg  650 mg Oral Q6H PRN Ivor Costa, MD       Or  . acetaminophen (TYLENOL) suppository 650 mg  650 mg Rectal Q6H PRN Ivor Costa, MD      . albuterol (PROVENTIL) (2.5 MG/3ML) 0.083% nebulizer solution 2.5 mg  2.5 mg Nebulization Q4H PRN Ivor Costa, MD      . budesonide (PULMICORT) nebulizer solution 0.5 mg  0.5 mg Nebulization BID Ivor Costa, MD   0.5 mg at 12/26/16 0909  . ciprofloxacin (CIPRO) IVPB 400 mg  400 mg Intravenous QHS Dorrene German, [facility]   Stopped at 12/26/16 0246  . fluticasone (FLONASE) 50 MCG/ACT nasal spray 1 spray  1 spray Each Nare Daily Ivor Costa, MD   1 spray at 12/26/16 1148  . furosemide (LASIX) injection 40 mg  40 mg Intravenous Once Ivor Costa, MD      . ipratropium-albuterol (DUONEB) 0.5-2.5 (3) MG/3ML nebulizer solution 3 mL  3 mL Nebulization QID Ivor Costa, MD   3 mL at 12/26/16 1129  . MEDLINE mouth rinse  15 mL Mouth Rinse BID Patrecia Pour, Christean Grief, MD   15 mL at 12/26/16 1151  . metroNIDAZOLE (FLAGYL) IVPB 500 mg  500 mg Intravenous Cleophas Dunker, MD 100 mL/hr at 12/26/16 1548 500 mg at 12/26/16 1548  . morphine 2 MG/ML injection 1 mg  1 mg Intravenous Q4H PRN Ivor Costa, MD      . ondansetron [facility]) injection 4 mg  4 mg Intravenous Q8H PRN Ivor Costa, MD   4 mg at 12/26/16 0503  . pantoprazole (PROTONIX) injection 40 mg  40 mg Intravenous Daily Corey Harold, NP   40 mg at 12/26/16 1129  . phenylephrine (NEOSYNEPHRINE) 10-0.9 MG/250ML-% infusion  0-400 mcg/min  Intravenous Titrated Corey Harold, NP 30 mL/hr at 12/26/16 1633 20 mcg/min at 12/26/16 1633  . predniSONE (DELTASONE) tablet 10 mg  10 mg Oral Q breakfast Ivor Costa, MD   10 mg at 12/26/16 0759  . roflumilast (DALIRESP) tablet 500 mcg  500 mcg Oral Daily Ivor Costa, MD   500 mcg at 12/26/16 1128  . sodium bicarbonate 150 mEq in dextrose 5 % 1,000 mL infusion  Intravenous Continuous Mauricia Area, MD 125 mL/hr at 12/26/16 1637    . sodium chloride flush (NS) 0.9 % injection 3 mL  3 mL Intravenous Q12H Ivor Costa, MD   3 mL at 12/26/16 0100    REVIEW OF SYSTEMS:   Eyes: Denies blurriness of vision, double vision or watery eyes Ears, nose, mouth, throat, and face: Denies mucositis or sore throat Respiratory: Denies cough, dyspnea or wheezes Cardiovascular: Denies palpitation, chest discomfort or lower extremity swelling Gastrointestinal:  Denies nausea, heartburn or change in bowel habits Skin: Denies abnormal skin rashes Lymphatics: Denies new lymphadenopathy or easy bruising Neurological:Denies numbness, tingling or new weaknesses Behavioral/Psych: Mood is stable, no new changes  All other systems were reviewed with the patient and are negative.  PHYSICAL EXAMINATION: ECOG PERFORMANCE STATUS: 3 - Symptomatic, >50% confined to bed  Vitals:   12/26/16 1625 12/26/16 1646  BP:  (!) 138/114  Pulse: (!) 112 (!) 106  Resp: (!) 21 19  Temp:  98.9 F (37.2 C)   Filed Weights   12/19/2016 1658  Weight: 130 lb (59 kg)    GENERAL:alert, ill-appearing with evidence of central cyanosis.  Oxygen being delivered via nasal cannula SKIN: She looks very pale EYES: normal, conjunctiva are pale and non-injected, sclera clear OROPHARYNX:no exudate, no erythema and lips, buccal mucosa, and tongue normal.  She is pursing her lips with evidence of central cyanosis NECK: supple, thyroid normal size, non-tender, without nodularity LYMPH:  no palpable lymphadenopathy in the cervical, axillary or  inguinal LUNGS: clear to auscultation and percussion with normal breathing effort HEART: Tachycardia, no murmurs with and no lower extremity edema ABDOMEN:abdomen soft, non-tender and normal bowel sounds Musculoskeletal:no cyanosis of digits and no clubbing  PSYCH: alert & oriented x 3 with fluent speech NEURO: no focal motor/sensory deficits  LABORATORY DATA:  I have reviewed the data as listed Lab Results  Component Value Date   WBC 11.1 (H) 12/26/2016   HGB 6.6 (LL) 12/26/2016   HCT 19.5 (L) 12/26/2016   MCV 84.8 12/26/2016   PLT 101 (L) 12/26/2016    Recent Labs  03/11/16 1446 12/05/16 2124  12/01/2016 1958 12/17/2016 2326 12/26/16 0701 12/26/16 1239  NA 143 139  < > 134* 135 136 137  K 4.0 4.0  < > 6.4* 6.4* 5.8* 5.4*  CL 102 106  < > 107 109 110 112*  CO2 35* 23  < > 17* 16* 14* 15*  GLUCOSE 94 105*  < > 89 89 123* 177*  BUN 15 14  < > 58* 57* 54* 55*  CREATININE 1.02 0.91  < > 3.19* 3.06* 3.01* 2.99*  CALCIUM 9.6 9.0  < > 7.6* 7.5* 7.5* 7.2*  GFRNONAA  --  >60  --  13* 14* 14* 15*  GFRAA  --  >60  --  16* 16* 17* 17*  PROT 7.3 7.4  --  6.1*  --   --   --   ALBUMIN 3.4* 2.9*  --  1.8*  --   --   --   AST 10 8*  --  25  --   --   --   ALT 4 <5*  --  19  --   --   --   ALKPHOS 66 58  --  229*  --   --   --   BILITOT 0.2 0.2*  --  0.6  --   --   --   BILIDIR 0.1  --   --  0.3  --   --   --  IBILI  --   --   --  0.3  --   --   --   < > = values in this interval not displayed.  RADIOGRAPHIC STUDIES: I have personally reviewed the radiological images as listed and agreed with the findings in the report. Ct Abdomen Pelvis Wo Contrast  Result Date: 11/28/2016 CLINICAL DATA:  Nausea vomiting and hypotension EXAM: CT ABDOMEN AND PELVIS WITHOUT CONTRAST TECHNIQUE: Multidetector CT imaging of the abdomen and pelvis was performed following the standard protocol without IV contrast. COMPARISON:  11/12/2016, PET-CT 10/30/2016, CT abdomen pelvis 01/16/2005 FINDINGS: Lower chest:  Lung bases demonstrate no acute consolidation or pleural effusion. Large hiatal hernia. The heart is slightly enlarged. There are coronary artery calcifications. Hepatobiliary: No intra hepatic biliary dilatation. Surgical clips in the gallbladder fossa. Stable enlarged extrahepatic common bile duct. Pancreas: Unremarkable. No pancreatic ductal dilatation or surrounding inflammatory changes. Spleen: Small splenic tissue in the left upper quadrant. Adrenals/Urinary Tract: Adrenal glands are within normal limits. Nonspecific perinephric fat stranding. Prominent renal pelvises bilaterally without ureteral stone. Small exophytic lesion mid right kidney. Small stones versus intrarenal vascular calcifications within both kidneys. The bladder is unremarkable. Stomach/Bowel: Stomach nonenlarged. No dilated small bowel. None questionable mild wall thickening involving the cecum, and ascending colon with minimal haziness in the adjacent pericolonic fat. Appendix not well visualized. Remainder of the colon shows no focal wall thickening. Sigmoid colon diverticula. Vascular/Lymphatic: Extensive vascular calcifications. Few nonspecific lymph nodes within the hiatal hernia fat. No significantly enlarged pelvic lymph nodes. Reproductive: Status post hysterectomy. No adnexal masses. Other: Soft tissue density with scattered calcifications extending to the skin, posterior to the left inferior sacrum as before. This measures up to 5 cm. A fat density mass was noted in the region on 2006 CT. No free air or free fluid. Musculoskeletal: Scoliosis of the spine. Status post right hip replacement with artifact and limiting factors. IMPRESSION: 1. Questionable mild wall thickening involving the cecum and ascending colon, difficult to exclude a mild colitis or enteritis. No evidence for a bowel obstruction. The remainder of the colon is within normal limits. 2. Post cholecystectomy with enlarged extrahepatic common bowel duct, likely due to  post cholecystectomy change 3. Stable soft tissue mass with scattered calcifications posterior to the sacrum. Electronically Signed   By: Donavan Foil M.D.   On: 12/18/2016 21:11   Ct Angio Head W Or Wo Contrast  Result Date: 12/05/2016 CLINICAL DATA:  76 y/o  F; nontraumatic neck pain. EXAM: CT ANGIOGRAPHY HEAD AND NECK TECHNIQUE: Multidetector CT imaging of the head and neck was performed using the standard protocol during bolus administration of intravenous contrast. Multiplanar CT image reconstructions and MIPs were obtained to evaluate the vascular anatomy. Carotid stenosis measurements (when applicable) are obtained utilizing NASCET criteria, using the distal internal carotid diameter as the denominator. CONTRAST:  100 cc Isovue 370 COMPARISON:  03/18/2007 CT head FINDINGS: CT HEAD FINDINGS Brain: No evidence of acute infarction, hemorrhage, hydrocephalus, extra-axial collection or mass lesion/mass effect. Stable chronic lacunar infarct within the left anterior lentiform nucleus and periventricular white matter of left frontal lobe. Mild chronic microvascular ischemic changes and parenchymal volume loss of the brain. Vascular: Extensive calcific atherosclerosis of carotid siphons. Skull: Normal. Negative for fracture or focal lesion. Sinuses: Mild ethmoid sinus mucosal thickening. Right-greater-than-left mastoid effusions. Orbits: No acute finding. Review of the MIP images confirms the above findings CTA NECK FINDINGS Aortic arch: Standard branching. Imaged portion shows no evidence of aneurysm or dissection. Moderate calcific  atherosclerosis of the aortic arch. Calcified plaque of left subclavian artery origin with 30% stenosis. Right carotid system: No evidence of dissection, stenosis (50% or greater) or occlusion. Mixed plaque of common carotid artery and of the carotid bifurcation with minimal less than 30% stenosis of proximal ICA. Left carotid system: Motion degraded left common carotid artery  origin, question mild stenosis. Mixed plaque of common carotid artery and of the carotid bifurcation with mild 40% proximal ICA stenosis. Vertebral arteries: Mild left dominance. No evidence of dissection, stenosis (50% or greater) or occlusion. Calcified plaque of left distal V1 segment with mild stenosis. Skeleton: Mild cervical spondylosis for age greatest at the C5 through C7 levels. No high-grade bony canal stenosis. Other neck: Subcentimeter nodules in the bilateral lobes of the thyroid gland. Upper chest: Moderate pulmonary emphysema. Review of the MIP images confirms the above findings CTA HEAD FINDINGS Anterior circulation: No significant stenosis, proximal occlusion, aneurysm, or vascular malformation. Moderate calcific atherosclerosis of bilateral cavernous and paraclinoid segments of the internal carotid arteries with areas of mild stenosis. Posterior circulation: No significant stenosis, proximal occlusion, aneurysm, or vascular malformation. Venous sinuses: As permitted by contrast timing, patent. Anatomic variants: Patent anterior communicating artery and bilateral posterior communicating arteries. Delayed phase: No abnormal intracranial enhancement. Review of the MIP images confirms the above findings IMPRESSION: 1. No acute intracranial abnormality identified. No abnormal enhancement of the brain. 2. Mild chronic microvascular ischemic changes and mild parenchymal volume loss of the brain. 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 3. Right-greater-than-left mastoid air cell effusions. 4. Patent carotid and vertebral arteries. No dissection, aneurysm, or significant stenosis is identified. 5. Mixed plaque of bilateral carotid bifurcations with proximal ICA 30-40% mild stenosis. 6. No mass or inflammatory process of the neck identified. 7. Mild cervical spine spondylosis for age greatest at the C5 through C7 levels. No high-grade bony canal stenosis. 8. Patent circle of Willis. No large  vessel occlusion, aneurysm, or significant stenosis is identified. Electronically Signed   By: Kristine Garbe M.D.   On: 12/05/2016 23:38   Dg Chest 2 View  Result Date: 12/05/2016 CLINICAL DATA:  Cough.  Neck pain. EXAM: CHEST  2 VIEW COMPARISON:  Chest CT 10/09/2016 FINDINGS: Hyperinflation and emphysema. The right lower lobe pulmonary nodule on CT is not well seen radiographically. Unchanged heart size and mediastinal contours with large hiatal hernia. No pulmonary edema, pleural effusion, focal airspace disease or pneumothorax. The bones are under mineralized. IMPRESSION: Emphysema without acute abnormality. Known right lower lobe pulmonary nodule not well seen radiographically. Electronically Signed   By: Jeb Levering M.D.   On: 12/05/2016 21:51   Ct Angio Neck W Or Wo Contrast  Result Date: 12/05/2016 CLINICAL DATA:  76 y/o  F; nontraumatic neck pain. EXAM: CT ANGIOGRAPHY HEAD AND NECK TECHNIQUE: Multidetector CT imaging of the head and neck was performed using the standard protocol during bolus administration of intravenous contrast. Multiplanar CT image reconstructions and MIPs were obtained to evaluate the vascular anatomy. Carotid stenosis measurements (when applicable) are obtained utilizing NASCET criteria, using the distal internal carotid diameter as the denominator. CONTRAST:  100 cc Isovue 370 COMPARISON:  03/18/2007 CT head FINDINGS: CT HEAD FINDINGS Brain: No evidence of acute infarction, hemorrhage, hydrocephalus, extra-axial collection or mass lesion/mass effect. Stable chronic lacunar infarct within the left anterior lentiform nucleus and periventricular white matter of left frontal lobe. Mild chronic microvascular ischemic changes and parenchymal volume loss of the brain. Vascular: Extensive calcific atherosclerosis of carotid siphons. Skull:  Normal. Negative for fracture or focal lesion. Sinuses: Mild ethmoid sinus mucosal thickening. Right-greater-than-left mastoid  effusions. Orbits: No acute finding. Review of the MIP images confirms the above findings CTA NECK FINDINGS Aortic arch: Standard branching. Imaged portion shows no evidence of aneurysm or dissection. Moderate calcific atherosclerosis of the aortic arch. Calcified plaque of left subclavian artery origin with 30% stenosis. Right carotid system: No evidence of dissection, stenosis (50% or greater) or occlusion. Mixed plaque of common carotid artery and of the carotid bifurcation with minimal less than 30% stenosis of proximal ICA. Left carotid system: Motion degraded left common carotid artery origin, question mild stenosis. Mixed plaque of common carotid artery and of the carotid bifurcation with mild 40% proximal ICA stenosis. Vertebral arteries: Mild left dominance. No evidence of dissection, stenosis (50% or greater) or occlusion. Calcified plaque of left distal V1 segment with mild stenosis. Skeleton: Mild cervical spondylosis for age greatest at the C5 through C7 levels. No high-grade bony canal stenosis. Other neck: Subcentimeter nodules in the bilateral lobes of the thyroid gland. Upper chest: Moderate pulmonary emphysema. Review of the MIP images confirms the above findings CTA HEAD FINDINGS Anterior circulation: No significant stenosis, proximal occlusion, aneurysm, or vascular malformation. Moderate calcific atherosclerosis of bilateral cavernous and paraclinoid segments of the internal carotid arteries with areas of mild stenosis. Posterior circulation: No significant stenosis, proximal occlusion, aneurysm, or vascular malformation. Venous sinuses: As permitted by contrast timing, patent. Anatomic variants: Patent anterior communicating artery and bilateral posterior communicating arteries. Delayed phase: No abnormal intracranial enhancement. Review of the MIP images confirms the above findings IMPRESSION: 1. No acute intracranial abnormality identified. No abnormal enhancement of the brain. 2. Mild  chronic microvascular ischemic changes and mild parenchymal volume loss of the brain. 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 3. Right-greater-than-left mastoid air cell effusions. 4. Patent carotid and vertebral arteries. No dissection, aneurysm, or significant stenosis is identified. 5. Mixed plaque of bilateral carotid bifurcations with proximal ICA 30-40% mild stenosis. 6. No mass or inflammatory process of the neck identified. 7. Mild cervical spine spondylosis for age greatest at the C5 through C7 levels. No high-grade bony canal stenosis. 8. Patent circle of Willis. No large vessel occlusion, aneurysm, or significant stenosis is identified. Electronically Signed   By: Kristine Garbe M.D.   On: 12/05/2016 23:38   Dg Chest Port 1 View  Result Date: 11/30/2016 CLINICAL DATA:  76 year old presenting with acute onset of shortness of breath. EXAM: PORTABLE CHEST 1 VIEW COMPARISON:  12/05/2016, 11/06/2014 and earlier, including CT chest 10/09/2016 and earlier. PET-CT 10/30/2016. FINDINGS: Suboptimal inspiration accounts for crowded bronchovascular markings diffusely and atelectasis in the bases, and accentuates the cardiac silhouette. Taking this into account, cardiac silhouette upper normal in size to slightly enlarged but stable. The previously identified large hiatal hernia is less conspicuous due to technique. Lungs otherwise clear. Pulmonary vascularity normal. No visible pleural effusions. IMPRESSION: Suboptimal inspiration accounts for bibasilar atelectasis. No acute cardiopulmonary disease otherwise. Stable borderline heart size. Electronically Signed   By: Evangeline Dakin M.D.   On: 12/17/2016 18:23   I have reviewed her peripheral smear.  Noted hypochromic microcytic anemia with presence of occasional target cells.  Noted numerous bands.  Occasional schistocytes but not a prominent feature ASSESSMENT & PLAN:   Severe anemia Multifactorial, likely background of iron  deficiency anemia with severe anemia associated with renal failure The presentation is not compatible with hemolytic anemia I agree with blood transfusion to keep hemoglobin greater than 8  Acute thrombocytopenia Likely due to sepsis/consumption This is not compatible with ITP, TTP or HUS No need for transfusion unless platelet count less than 20,000 or the patient have signs of bleeding  Elevated troponin Recent severe abdominal pain with abnormal CT imaging All these findings are likely induced by ischemia secondary to severe anemia  The abnormal changes on the CT could be due to ischemic colitis  Acute renal failure with hyperkalemia Likely secondary to contrast exposure on 12/05/2016 Could be also related to sepsis Agree with aggressive IV fluid resuscitation and management of hyperkalemia  Abnormal PET CT scan 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

## 2016-12-26 NOTE — Telephone Encounter (Signed)
She is now in [place] inpatient  Dr. Brand Males, M.D., [facility].C.P Pulmonary and Critical Care Medicine Staff Physician [facility] Pager: [phone], If no answer or between  15:00h - 7:00h: call [phone]  12/26/2016 6:07 AM

## 2016-12-26 NOTE — Progress Notes (Signed)
PROGRESS NOTE [facility]   Pamela Hampton   TFT:[id number] DOB: May 05, 1941  DOA: 12/16/2016 PCP: Marletta Lor, MD   Brief Narrative:  Pamela Hampton is a 76 y.o. female with medical history significant of hypertension, hyperlipidemia, COPD, chronic respiratory failure on 4 L oxygen, GERD, depression, stroke, seizure, ulcer, anemia, Barrett's esophagus, blood disorder (patient had some type of blood disorder per her daughter, but not sure which type, not treated), vaginal cancer (s/p of hysterectomy, radiation implant 1997, and chemotherapy), who presents with nausea, vomiting, abdominal pain and generalized weakness. In the ED patient was found to have a CT scan concerning for possible colitis or enteritis and was hypotensive with SBP to the 70s, hemoglobin of 7.6, creatinine 3.19 and potassium of 7.2. Patient was admitted for septic shock and acute renal failure.  Subjective: Patient seen and examined, she report abdominal pain has improved. Still having diarrhea, non bloody.   Assessment & Plan: Septic shock due to enteritis/colitis  Patient was started on Neo, tapering down  BP responding well to IVF Keep MAP above > 65  Continue broad spectrum abx - Cipro and Flagyl   AKI w/ hyperkalemia and metabolic acidosis - Cr trending down although HyperK has persist despite treatment  Continue IVF, Patient with poor urine output  Will consult nephrology   Elevated TNI  Demand ischemia, no significant EKG changes, ECHO relative normal. No further cardiac work up at this time   Chronic hypoxia/COPD stage IV on 4 L - Stable  Oxygen requirement at baseline 4L [place]  Continue current regimen     Anemia unknown etiology at this time  Anemia panel  Transfuse 1 units of PRBC FOBT  Monitor CBC after transfusion   DVT prophylaxis: SCD's Code Status: DNR, I have discussed with son whom is POA and confirm this status  Family Communication: Discussed with son via phone call    Disposition Plan: unable to determine at this time   Consultants:   PCCM   Nephrology   Procedures:   ECHO   Antimicrobials: Anti-infectives    Start     Dose/Rate Route Frequency Ordered Stop   12/26/16 0000  ciprofloxacin (CIPRO) IVPB 400 mg     400 mg 200 mL/hr over 60 Minutes Intravenous Daily at bedtime 12/15/2016 2350     12/20/2016 2359  metroNIDAZOLE (FLAGYL) IVPB 500 mg     500 mg 100 mL/hr over 60 Minutes Intravenous Every 8 hours 11/26/2016 2304         Objective: Vitals:   12/26/16 1129 12/26/16 1200 12/26/16 1245 12/26/16 1300  BP:   (!) 104/50 (!) 109/54  Pulse:   94 95  Resp:   19 (!) 22  Temp:  97.9 F (36.6 C)    TempSrc:  Oral    SpO2: 100%  100% 99%  Weight:      Height:        Intake/Output Summary (Last 24 hours) at 12/26/16 1343 Last data filed at 12/26/16 1200  Gross per 24 hour  Intake          4877.83 ml  Output              170 ml  Net          4707.83 ml   Filed Weights   12/24/2016 1658  Weight: 59 kg (130 lb)    Examination:  General exam: NAD, Pale  Respiratory system: Decrease breath sound and scattered rhonchi  Cardiovascular system: S1S2 RRR  Gastrointestinal system: Adb soft non distended, diffuse tenderness  Central nervous system: Alert and oriented. . Extremities: No pedal edema.  Skin: No rashes, lesions or ulcers Psychiatry: Judgement and insight appear normal. Mood & affect appropriate.    Data Reviewed: I have personally reviewed following labs and imaging studies  CBC:  Recent Labs Lab 12/24/2016 1859 11/27/2016 1958 12/26/16 0701  WBC  --  14.5* 11.1*  NEUTROABS  --  11.3* 9.5*  HGB 17.7* 7.6* 6.6*  HCT 52.0* 21.9* 19.5*  MCV  --  83.6 84.8  PLT  --  122* 174*   Basic Metabolic Panel:  Recent Labs Lab 12/14/2016 1112 12/20/2016 1859 11/26/2016 1958 12/04/2016 2326 12/26/16 0701 12/26/16 1239  NA 131* 131* 134* 135 136 137  K 5.9* 7.2* 6.4* 6.4* 5.8* 5.4*  CL 101 105 107 109 110 112*  CO2 19  --  17*  16* 14* 15*  GLUCOSE 132* 85 89 89 123* 177*  BUN 54* 82* 58* 57* 54* 55*  CREATININE 3.02* 3.20* 3.19* 3.06* 3.01* 2.99*  CALCIUM 8.4  --  7.6* 7.5* 7.5* 7.2*   GFR: Estimated Creatinine Clearance: 13.9 mL/min (A) (by C-G formula based on SCr of 2.99 mg/dL (H)). Liver Function Tests:  Recent Labs Lab 12/17/2016 1958  AST 25  ALT 19  ALKPHOS 229*  BILITOT 0.6  PROT 6.1*  ALBUMIN 1.8*    Recent Labs Lab 11/27/2016 1958  LIPASE 26   No results for input(s): AMMONIA in the last 168 hours. Coagulation Profile: No results for input(s): INR, PROTIME in the last 168 hours. Cardiac Enzymes:  Recent Labs Lab 12/26/16 0701 12/26/16 1239  TROPONINI 0.09* 0.08*   BNP (last 3 results) No results for input(s): PROBNP in the last 8760 hours. HbA1C: No results for input(s): HGBA1C in the last 72 hours. CBG:  Recent Labs Lab 12/26/16 0742  GLUCAP 122*   Lipid Profile:  Recent Labs  12/26/16 0701  CHOL 65  HDL 14*  LDLCALC 30  TRIG 103  CHOLHDL 4.6   Thyroid Function Tests: No results for input(s): TSH, T4TOTAL, FREET4, T3FREE, THYROIDAB in the last 72 hours. Anemia Panel: No results for input(s): VITAMINB12, FOLATE, FERRITIN, TIBC, IRON, RETICCTPCT in the last 72 hours. Sepsis Labs:  Recent Labs Lab 12/17/2016 1859 12/14/2016 2229 12/26/16 0701  PROCALCITON  --   --  2.59  LATICACIDVEN 1.69 1.4  --     Recent Results (from the past 240 hour(s))  MRSA PCR Screening     Status: None   Collection Time: 11/28/2016 11:25 PM  Result Value Ref Range Status   MRSA by PCR NEGATIVE NEGATIVE Final    Comment:        The GeneXpert MRSA Assay (FDA approved for NASAL specimens only), is one component of a comprehensive MRSA colonization surveillance program. It is not intended to diagnose MRSA infection nor to guide or monitor treatment for MRSA infections.      Radiology Studies: Ct Abdomen Pelvis Wo Contrast  Result Date: 12/08/2016 CLINICAL DATA:  Nausea  vomiting and hypotension EXAM: CT ABDOMEN AND PELVIS WITHOUT CONTRAST TECHNIQUE: Multidetector CT imaging of the abdomen and pelvis was performed following the standard protocol without IV contrast. COMPARISON:  11/12/2016, PET-CT 10/30/2016, CT abdomen pelvis 01/16/2005 FINDINGS: Lower chest: Lung bases demonstrate no acute consolidation or pleural effusion. Large hiatal hernia. The heart is slightly enlarged. There are coronary artery calcifications. Hepatobiliary: No intra hepatic biliary dilatation. Surgical clips in the gallbladder fossa. Stable enlarged extrahepatic  common bile duct. Pancreas: Unremarkable. No pancreatic ductal dilatation or surrounding inflammatory changes. Spleen: Small splenic tissue in the left upper quadrant. Adrenals/Urinary Tract: Adrenal glands are within normal limits. Nonspecific perinephric fat stranding. Prominent renal pelvises bilaterally without ureteral stone. Small exophytic lesion mid right kidney. Small stones versus intrarenal vascular calcifications within both kidneys. The bladder is unremarkable. Stomach/Bowel: Stomach nonenlarged. No dilated small bowel. None questionable mild wall thickening involving the cecum, and ascending colon with minimal haziness in the adjacent pericolonic fat. Appendix not well visualized. Remainder of the colon shows no focal wall thickening. Sigmoid colon diverticula. Vascular/Lymphatic: Extensive vascular calcifications. Few nonspecific lymph nodes within the hiatal hernia fat. No significantly enlarged pelvic lymph nodes. Reproductive: Status post hysterectomy. No adnexal masses. Other: Soft tissue density with scattered calcifications extending to the skin, posterior to the left inferior sacrum as before. This measures up to 5 cm. A fat density mass was noted in the region on 2006 CT. No free air or free fluid. Musculoskeletal: Scoliosis of the spine. Status post right hip replacement with artifact and limiting factors. IMPRESSION: 1.  Questionable mild wall thickening involving the cecum and ascending colon, difficult to exclude a mild colitis or enteritis. No evidence for a bowel obstruction. The remainder of the colon is within normal limits. 2. Post cholecystectomy with enlarged extrahepatic common bowel duct, likely due to post cholecystectomy change 3. Stable soft tissue mass with scattered calcifications posterior to the sacrum. Electronically Signed   By: Donavan Foil M.D.   On: 12/22/2016 21:11   Dg Chest Port 1 View  Result Date: 12/07/2016 CLINICAL DATA:  76 year old presenting with acute onset of shortness of breath. EXAM: PORTABLE CHEST 1 VIEW COMPARISON:  12/05/2016, 11/06/2014 and earlier, including CT chest 10/09/2016 and earlier. PET-CT 10/30/2016. FINDINGS: Suboptimal inspiration accounts for crowded bronchovascular markings diffusely and atelectasis in the bases, and accentuates the cardiac silhouette. Taking this into account, cardiac silhouette upper normal in size to slightly enlarged but stable. The previously identified large hiatal hernia is less conspicuous due to technique. Lungs otherwise clear. Pulmonary vascularity normal. No visible pleural effusions. IMPRESSION: Suboptimal inspiration accounts for bibasilar atelectasis. No acute cardiopulmonary disease otherwise. Stable borderline heart size. Electronically Signed   By: Evangeline Dakin M.D.   On: 12/21/2016 18:23    Scheduled Meds: . budesonide (PULMICORT) nebulizer solution  0.5 mg Nebulization BID  . fluticasone  1 spray Each Nare Daily  . furosemide  40 mg Intravenous Once  . ipratropium-albuterol  3 mL Nebulization QID  . mouth rinse  15 mL Mouth Rinse BID  . pantoprazole (PROTONIX) IV  40 mg Intravenous Daily  . predniSONE  10 mg Oral Q breakfast  . roflumilast  500 mcg Oral Daily  . sodium chloride flush  3 mL Intravenous Q12H   Continuous Infusions: . sodium chloride    . ciprofloxacin Stopped (12/26/16 0246)  . metronidazole Stopped  (12/26/16 1231)  . phenylephrine (NEO-SYNEPHRINE) Adult infusion 30 mcg/min (12/26/16 1245)  .  sodium bicarbonate  infusion 1000 mL 50 mL/hr at 12/26/16 1159     LOS: 1 day    Chipper Oman, MD Pager: Text Page via [email or web address]  [phone]  If 7PM-7AM, please contact night-coverage [email or web address] Password [email or web address] 12/26/2016, 1:43 PM

## 2016-12-26 NOTE — Progress Notes (Signed)
Spoke with Staff RN about PICC line placement made aware that IV Team could not place PICC today we will be able to do so tomorrow , If patient needing more access before then recommended central line place by MD.

## 2016-12-26 NOTE — Care Management Note (Signed)
Case Management Note  Patient Details  Name: XYLA LEISNER MRN: [id number] Date of Birth: [birth year]  Subjective/Objective:                  76 y.o. female with medical history significant of hypertension, hyperlipidemia, COPD, chronic respiratory failure on 4 L oxygen, GERD, depression, stroke, seizure, ulcer, anemia, Barrett's esophagus, blood disorder (patient had some type of blood disorder per her daughter, but not sure which type, not treated), vaginal cancer (s/p of hysterectomy, radiation implant 1997, and chemotherapy), who presents with nausea, vomiting, abdominal pain and generalized weakness.  Per patient's daughter, patient has been having nausea and vomiting and abdominal pain for almost 20 days. She vomited 3-4 times each day. No diarrhea, fever or chills. Her abdominal pain is located in the epigastric area, constant, moderate, nonradiating. It is not aggravated or alleviated by any factors. Patient has mild cough and shortness rest, which is at the baseline. No wheezing or sputum production. Patient states that she had mild lower chest pain recently, but denies any chest pain today. Denies symptoms of UTI or unilateral weakness. No hematemesis or hematochezia. Patient was seen by her pulmonologist today, and referred to PCP due to hypotension, and her PCP sent pt to ED for further evaluation and treatment. Daughter states that the patient had mild confusion, but currently patient is alert, oriented 3 when I saw patient in ED. Pt was noted to have swelling and redness in left 2nd and 4th toe by her daughter. Pt states that she has pain all over, particularly in right upper back.  ED Course: pt was found to have  hypotension with SBP 70s-80s which responded to IV fluid resuscitation, and improved to SBP above 90, WBC 14.5, hemoglobin 7.6 which was 9.6 on 12/05/16, repeat 122, lactic acid to 1.69, potassium 7.2, 6.4, bicarbonate 17, acute renal injury with creatinine 3.19, BUN 58, normal  abdomen, pending urinalysis, temperature normal, bradycardia, oxygen saturation 91-97% on 4 L oxygen, negative chest x-ray for acute abnormalities, lipase 26, negative FOBT. CT abdomen/pelvis showed possible colitis or enteritis. Patient is admitted to stepdown as inpatient. Per EDP, lab called and reported abnormal blood cell abnormality, but needs pulmonologist to review for diagnosis.    Action/Plan: Date:  December 26, 2016  Chart reviewed for concurrent status and case management needs.  Will continue to follow patient progress.  Discharge Planning: following for needs  Expected discharge date: [id number]  Velva Harman, BSN, [place], [place]   Expected Discharge Date:                  Expected Discharge Plan:  [place]  In-House Referral:     Discharge planning Services  CM Consult  Post Acute Care Choice:    Choice offered to:  NA  DME Arranged:    DME Agency:     HH Arranged:  [facility] [place] Agency:     Status of Service:  In process, will continue to follow  If discussed at Long Length of Stay Meetings, dates discussed:    Additional Comments:  Leeroy Cha, RN 12/26/2016, 9:14 AM

## 2016-12-26 NOTE — Progress Notes (Signed)
  Echocardiogram 2D Echocardiogram has been performed.  Darlina Sicilian M 12/26/2016, 8:53 AM

## 2016-12-26 NOTE — Consult Note (Signed)
Reason for Consult:AKI  Referring Physician: Dr. Neldon Labella Pamela Hampton is an 76 y.o. female.  HPI: 76 yr female with O2 dependent COPD, DJD, HTN, depression , anemia, osteoporosis, HOH, visual impairment,^lipids, Hepatitis in past??type, Vag Ca with RAD Rx, chemo, implants, CVA, now with 2-3 wk of poor intake, presented with N, V, D yesterday.  Was weak, low bp,felt to be septic.  CT with evidence enteritis , colitis.   Has recently, last 4 wk or so with hematuria, recurrent UTIs. On Trimethoprim.  Claims kidney prob years ago with urine infx , severe.  Has been on Goodys, and taking Lisinopril/HCTZ.   Admitted with Cr of 3.2, and now 2.99.  Has normal lactate, ^ PCT.   Also has platelets of 101K with normal baseline.  Also anemic with Hb going 9.6 to 7.6. Urine 6-30 RBC, and 6-30 RBC.   Constitutional: as above, aching all over Eyes: legally blind Ears, nose, mouth, throat, and face: HOH, dry  mouth Respiratory: SOB Cardiovascular: negative Gastrointestinal: V and D only 48 h. Genitourinary:hematuria, recurrent UTIs, noct 3-4 Integument/breast: bruises easily Hematologic/lymphatic: anemia Musculoskeletal:anemia Allergic/Immunologic: negative  Diffuse aches and pains   Past Medical History:  Diagnosis Date  . Allergy    Rhinitis  . Anemia   . Arthritis   . Barrett's esophagus 07/19/2004  . Blood transfusion   . COPD (chronic obstructive pulmonary disease) (HCC)    wears 3 liters of o2 continious  . Depression   . Emphysema of lung ([place])   . GERD (gastroesophageal reflux disease)    Barrett's esophagus  . Hepatitis   . Hiatal hernia   . History of oxygen administration    oxygen concentrator @ 4 l/m nasally 24/ 7  . Hyperlipidemia   . Hypertension   . Impaired hearing   . Obesity   . Osteoporosis   . Pneumonia   . Seizures ([place])   . Sleep apnea   . Stroke ([place])   . Ulcer   . Vaginal cancer ([place])    radiation implant '97. radiation. chemo.    Past Surgical History:   Procedure Laterality Date  . ABDOMINAL HYSTERECTOMY    . abdominal laproscopy surgery    . CHOLECYSTECTOMY    . ESOPHAGOGASTRODUODENOSCOPY (EGD) WITH PROPOFOL N/A 12/21/2014   Procedure: ESOPHAGOGASTRODUODENOSCOPY (EGD) WITH PROPOFOL;  Surgeon: Inda Castle, MD;  Location: [facility];  Service: Endoscopy;  Laterality: N/A;  . HEMORRHOID SURGERY    . JOINT REPLACEMENT    . ORIF HIP FRACTURE Right   . TOTAL HIP ARTHROPLASTY Right   . 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 Heart disease Mother        MI  . Diabetes Mother   . Uterine cancer Mother   . Anemia Mother   . Hypertension Mother   . Stomach cancer Father   . Esophageal cancer Father   . Hypertension Father   . Diabetes Sister   . Anemia Sister   . Esophageal cancer Brother   . Hypertension Brother   . Diabetes Son   . Diabetes Son   . Brain cancer Brother   . Lung cancer Sister   . Liver cancer Sister   . Colon cancer Neg Hx   . Rectal cancer Neg Hx     Social History:  reports that she quit smoking about 14 years ago. Her smoking use included  Cigarettes. She has a 60.00 pack-year smoking history. She has never used smokeless tobacco. She reports that she does not drink alcohol or use drugs.  Allergies: No Known Allergies  Medications:  I have reviewed the patient's current medications. Prior to Admission:  Prescriptions Prior to Admission  Medication Sig Dispense Refill Last Dose  . albuterol (PROVENTIL) (2.5 MG/3ML) 0.083% nebulizer solution INHALE THE CONTENTS OF 1 VIAL VIA NEBULIZER 4 TIMES A DAY. 360 mL 2 Past Week at Unknown time  . beclomethasone (QVAR) 80 MCG/ACT inhaler Inhale 2 puffs into the lungs 2 (two) times daily. 8.7 g 6 12/24/2016 at Unknown time  . DALIRESP 500 MCG TABS tablet TAKE ONE TABLET BY MOUTH ONCE DAILY 30 tablet 3 11/26/2016 at 1230  . escitalopram (LEXAPRO) 10 MG tablet  TAKE ONE TABLET BY MOUTH ONCE DAILY 30 tablet 5 12/17/2016 at Unknown time  . ferrous sulfate 325 (65 FE) MG tablet Take 325 mg by mouth 2 (two) times daily.  6 12/08/2016 at Unknown time  . fluticasone (FLONASE) 50 MCG/ACT nasal spray PLACE 2 SPRAYS INTO BOTH NOSTRILS DAILY. 16 g 5 12/01/2016 at Unknown time  . ibandronate (BONIVA) 150 MG tablet take 1 tablet by mouth in the morning on an empty stomach once monthly 1 tablet 5 Past Month at Unknown time  . ipratropium (ATROVENT) 0.02 % nebulizer solution INHALE THE CONTENTS OF 1 VIAL VIA NEBULIZER 4 TIMES A DAY. 300 mL 7 Past Week at Unknown time  . omeprazole (PRILOSEC) 20 MG capsule TAKE ONE CAPSULE BY MOUTH ONCE DAILY 90 capsule 3 12/20/2016 at Unknown time  . OXYGEN Inhale 4 L/min into the lungs continuous.   12/14/2016 at Unknown time  . potassium chloride SA (K-DUR,KLOR-CON) 20 MEQ tablet TAKE ONE TABLET BY MOUTH ONCE DAILY 30 tablet 0 12/24/2016 at Unknown time  . rosuvastatin (CRESTOR) 10 MG tablet TAKE ONE TABLET BY MOUTH ONCE DAILY 112 tablet 1 11/25/2016 at Unknown time  . traZODone (DESYREL) 100 MG tablet Take 1 tablet (100 mg total) by mouth at bedtime. 90 tablet 1 12/24/2016 at Unknown time  . trimethoprim (TRIMPEX) 100 MG tablet Take 100 mg by mouth daily.  11 12/21/2016 at Unknown time  . ondansetron (ZOFRAN) 4 MG tablet Take 1 tablet (4 mg total) by mouth every 8 (eight) hours as needed for nausea or vomiting. 20 tablet 0 unknown  . polyethylene glycol powder (GLYCOLAX/MIRALAX) powder Take 17 g by mouth 2 (two) times daily as needed. (Patient taking differently: Take 17 g by mouth daily as needed for mild constipation. ) 3350 g 1 unknown  . PROAIR HFA 108 (90 Base) MCG/ACT inhaler inhale TWO PUFFS BY MOUTH EVERY 4 HOURS AS NEEDED for SHORTNESS OF BREATH 8.5 g 5 unknown    Results for orders placed or performed during the hospital encounter of 11/29/2016 (from the past 48 hour(s))  I-stat troponin, ED     Status: Abnormal   Collection Time:  11/30/2016  6:57 PM  Result Value Ref Range   Troponin i, poc 0.14 (HH) 0.00 - 0.08 ng/mL   Comment NOTIFIED PHYSICIAN    Comment 3            Comment: Due to the release kinetics of cTnI, a negative result within the first hours of the onset of symptoms does not rule out myocardial infarction with certainty. If myocardial infarction is still suspected, repeat the test at appropriate intervals.   I-stat chem 8, ed     Status: Abnormal  Collection Time: 11/27/2016  6:59 PM  Result Value Ref Range   Sodium 131 (L) 135 - 145 mmol/L   Potassium 7.2 (HH) 3.5 - 5.1 mmol/L   Chloride 105 101 - 111 mmol/L   BUN 82 (H) 6 - 20 mg/dL   Creatinine, Ser 3.20 (H) 0.44 - 1.00 mg/dL   Glucose, Bld 85 65 - 99 mg/dL   Calcium, Ion 1.05 (L) 1.15 - 1.40 mmol/L   TCO2 21 0 - 100 mmol/L   Hemoglobin 17.7 (H) 12.0 - 15.0 g/dL   HCT 52.0 (H) 36.0 - 46.0 %   Comment NOTIFIED PHYSICIAN   I-Stat CG4 Lactic Acid, ED     Status: None   Collection Time: 12/21/2016  6:59 PM  Result Value Ref Range   Lactic Acid, Venous 1.69 0.5 - 1.9 mmol/L  CBC with Differential/Platelet     Status: Abnormal   Collection Time: 12/16/2016  7:58 PM  Result Value Ref Range   WBC 14.5 (H) 4.0 - 10.5 K/uL   RBC 2.62 (L) 3.87 - 5.11 MIL/uL   Hemoglobin 7.6 (L) 12.0 - 15.0 g/dL    Comment: DELTA CHECK NOTED REPEATED TO VERIFY    HCT 21.9 (L) 36.0 - 46.0 %   MCV 83.6 78.0 - 100.0 fL   MCH 29.0 26.0 - 34.0 pg   MCHC 34.7 30.0 - 36.0 g/dL   RDW 19.1 (H) 11.5 - 15.5 %   Platelets 122 (L) 150 - 400 K/uL   Neutrophils Relative % 77 %   Lymphocytes Relative 6 %   Monocytes Relative 15 %   Eosinophils Relative 0 %   Basophils Relative 0 %   Promyelocytes Absolute 1 %   Blasts 1 %   Neutro Abs 11.3 (H) 1.7 - 7.7 K/uL   Lymphs Abs 0.9 0.7 - 4.0 K/uL   Monocytes Absolute 2.2 (H) 0.1 - 1.0 K/uL   Eosinophils Absolute 0.0 0.0 - 0.7 K/uL   Basophils Absolute 0.0 0.0 - 0.1 K/uL   WBC Morphology MILD LEFT SHIFT (1-5% METAS, OCC MYELO,  OCC BANDS)     Comment: BLASTS  Basic metabolic panel     Status: Abnormal   Collection Time: 12/11/2016  7:58 PM  Result Value Ref Range   Sodium 134 (L) 135 - 145 mmol/L   Potassium 6.4 (HH) 3.5 - 5.1 mmol/L    Comment: RESULTS VERIFIED VIA RECOLLECT NO VISIBLE HEMOLYSIS CRITICAL RESULT CALLED TO, READ BACK BY AND VERIFIED WITH: Satira Mccallum [id number] @ 2031 BY J SCOTTON    Chloride 107 101 - 111 mmol/L   CO2 17 (L) 22 - 32 mmol/L   Glucose, Bld 89 65 - 99 mg/dL   BUN 58 (H) 6 - 20 mg/dL   Creatinine, Ser 3.19 (H) 0.44 - 1.00 mg/dL   Calcium 7.6 (L) 8.9 - 10.3 mg/dL   GFR calc non Af Amer 13 (L) >60 mL/min   GFR calc Af Amer 16 (L) >60 mL/min    Comment: (NOTE) The eGFR has been calculated using the CKD EPI equation. This calculation has not been validated in all clinical situations. eGFR's persistently <60 mL/min signify possible Chronic Kidney Disease.    Anion gap 10 5 - 15  Lipase, blood     Status: None   Collection Time: 12/08/2016  7:58 PM  Result Value Ref Range   Lipase 26 11 - 51 U/L  Hepatic function panel     Status: Abnormal   Collection Time: 12/15/2016  7:58 PM  Result Value Ref Range   Total Protein 6.1 (L) 6.5 - 8.1 g/dL   Albumin 1.8 (L) 3.5 - 5.0 g/dL   AST 25 15 - 41 U/L   ALT 19 14 - 54 U/L   Alkaline Phosphatase 229 (H) 38 - 126 U/L   Total Bilirubin 0.6 0.3 - 1.2 mg/dL   Bilirubin, Direct 0.3 0.1 - 0.5 mg/dL   Indirect Bilirubin 0.3 0.3 - 0.9 mg/dL  Pathologist smear review     Status: None   Collection Time: 12/18/2016  7:58 PM  Result Value Ref Range   Path Review Reviewed By Violet Baldy, M.D.     Comment: 6.1.18 NORMOCYTIC ANEMIA. LEUKOCYTOSIS WITH CIRCULATING BLASTS.THROMBOCYTOPENIA. HEMATOLOGIC EVALUATION IS RECOMMENDED.   Urinalysis, Routine w reflex microscopic     Status: Abnormal   Collection Time: 12/18/2016 10:29 PM  Result Value Ref Range   Color, Urine AMBER (A) YELLOW    Comment: BIOCHEMICALS MAY BE AFFECTED BY COLOR   APPearance  CLOUDY (A) CLEAR   Specific Gravity, Urine 1.021 1.005 - 1.030   pH 5.0 5.0 - 8.0   Glucose, UA NEGATIVE NEGATIVE mg/dL   Hgb urine dipstick MODERATE (A) NEGATIVE   Bilirubin Urine SMALL (A) NEGATIVE   Ketones, ur NEGATIVE NEGATIVE mg/dL   Protein, ur 100 (A) NEGATIVE mg/dL   Nitrite NEGATIVE NEGATIVE   Leukocytes, UA SMALL (A) NEGATIVE   RBC / HPF 6-30 0 - 5 RBC/hpf   WBC, UA 6-30 0 - 5 WBC/hpf   Bacteria, UA MANY (A) NONE SEEN   Squamous Epithelial / LPF 0-5 (A) NONE SEEN   Mucous PRESENT    Amorphous Crystal PRESENT   Creatinine, urine, random     Status: None   Collection Time: 12/19/2016 10:29 PM  Result Value Ref Range   Creatinine, Urine 130.06 mg/dL    Comment: Performed at [facility], [place] [address], [place], [place] [address]  Sodium, urine, random     Status: None   Collection Time: 12/21/2016 10:29 PM  Result Value Ref Range   Sodium, Ur 55 mmol/L    Comment: Performed at [facility] [address] [place], [place] [address]  Lactic acid, plasma     Status: None   Collection Time: 12/19/2016 10:29 PM  Result Value Ref Range   Lactic Acid, Venous 1.4 0.5 - 1.9 mmol/L  MRSA PCR Screening     Status: None   Collection Time: 12/10/2016 11:25 PM  Result Value Ref Range   MRSA by PCR NEGATIVE NEGATIVE    Comment:        The GeneXpert MRSA Assay (FDA approved for NASAL specimens only), is one component of a comprehensive MRSA colonization surveillance program. It is not intended to diagnose MRSA infection nor to guide or monitor treatment for MRSA infections.   Basic metabolic panel     Status: Abnormal   Collection Time: 12/07/2016 11:26 PM  Result Value Ref Range   Sodium 135 135 - 145 mmol/L   Potassium 6.4 (HH) 3.5 - 5.1 mmol/L    Comment: NO VISIBLE HEMOLYSIS CRITICAL RESULT CALLED TO, READ BACK BY AND VERIFIED WITH: Audree Camel [id number] @ 0042 BY J SCOTTON    Chloride 109 101 - 111 mmol/L   CO2 16 (L) 22 - 32 mmol/L   Glucose, Bld 89 65 -  99 mg/dL   BUN 57 (H) 6 - 20 mg/dL   Creatinine, Ser 3.06 (H) 0.44 - 1.00 mg/dL  Calcium 7.5 (L) 8.9 - 10.3 mg/dL   GFR calc non Af Amer 14 (L) >60 mL/min   GFR calc Af Amer 16 (L) >60 mL/min    Comment: (NOTE) The eGFR has been calculated using the CKD EPI equation. This calculation has not been validated in all clinical situations. eGFR's persistently <60 mL/min signify possible Chronic Kidney Disease.    Anion gap 10 5 - 15  Lactate dehydrogenase     Status: None   Collection Time: 12/26/16  7:01 AM  Result Value Ref Range   LDH 188 98 - 192 U/L  Type and screen [place]     Status: None (Preliminary result)   Collection Time: 12/26/16  7:01 AM  Result Value Ref Range   ABO/RH(D) O POS    Antibody Screen NEG    Sample Expiration 12/29/2016    Unit Number [id number]    Blood Component Type RED CELLS,LR    Unit division 00    Status of Unit ALLOCATED    Transfusion Status OK TO TRANSFUSE    Crossmatch Result Compatible    Unit Number [id number]    Blood Component Type RED CELLS,LR    Unit division 00    Status of Unit ALLOCATED    Transfusion Status OK TO TRANSFUSE    Crossmatch Result Compatible   Procalcitonin     Status: None   Collection Time: 12/26/16  7:01 AM  Result Value Ref Range   Procalcitonin 2.59 ng/mL    Comment:        Interpretation: PCT > 2 ng/mL: Systemic infection (sepsis) is likely, unless other causes are known. (NOTE)         ICU PCT Algorithm               Non ICU PCT Algorithm    ----------------------------     ------------------------------         PCT < 0.25 ng/mL                 PCT < 0.1 ng/mL     Stopping of antibiotics            Stopping of antibiotics       strongly encouraged.               strongly encouraged.    ----------------------------     ------------------------------       PCT level decrease by               PCT < 0.25 ng/mL       >= 80% from peak PCT       OR PCT 0.25 - 0.5 ng/mL           Stopping of antibiotics                                             encouraged.     Stopping of antibiotics           encouraged.    ----------------------------     ------------------------------       PCT level decrease by              PCT >= 0.25 ng/mL       < 80% from peak PCT        AND PCT >= 0.5 ng/mL  Continuing antibiotics                                               encouraged.       Continuing antibiotics            encouraged.    ----------------------------     ------------------------------     PCT level increase compared          PCT > 0.5 ng/mL         with peak PCT AND          PCT >= 0.5 ng/mL             Escalation of antibiotics                                          strongly encouraged.      Escalation of antibiotics        strongly encouraged.   Basic metabolic panel     Status: Abnormal   Collection Time: 12/26/16  7:01 AM  Result Value Ref Range   Sodium 136 135 - 145 mmol/L   Potassium 5.8 (H) 3.5 - 5.1 mmol/L   Chloride 110 101 - 111 mmol/L   CO2 14 (L) 22 - 32 mmol/L   Glucose, Bld 123 (H) 65 - 99 mg/dL   BUN 54 (H) 6 - 20 mg/dL   Creatinine, Ser 3.01 (H) 0.44 - 1.00 mg/dL   Calcium 7.5 (L) 8.9 - 10.3 mg/dL   GFR calc non Af Amer 14 (L) >60 mL/min   GFR calc Af Amer 17 (L) >60 mL/min    Comment: (NOTE) The eGFR has been calculated using the CKD EPI equation. This calculation has not been validated in all clinical situations. eGFR's persistently <60 mL/min signify possible Chronic Kidney Disease.    Anion gap 12 5 - 15  C-reactive protein     Status: Abnormal   Collection Time: 12/26/16  7:01 AM  Result Value Ref Range   CRP 25.2 (H) <1.0 mg/dL    Comment: Performed at [facility] [address] [place], [place] [address]  Troponin I (q 6hr x 3)     Status: Abnormal   Collection Time: 12/26/16  7:01 AM  Result Value Ref Range   Troponin I 0.09 (HH) <0.03 ng/mL    Comment: CRITICAL RESULT CALLED TO, READ BACK BY AND  VERIFIED WITH: S.RUSSELL RN [phone] [id number] A.QUIZON   Lipid panel     Status: Abnormal   Collection Time: 12/26/16  7:01 AM  Result Value Ref Range   Cholesterol 65 0 - 200 mg/dL   Triglycerides 103 <150 mg/dL   HDL 14 (L) >40 mg/dL   Total CHOL/HDL Ratio 4.6 RATIO   VLDL 21 0 - 40 mg/dL   LDL Cholesterol 30 0 - 99 mg/dL    Comment:        Total Cholesterol/HDL:CHD Risk Coronary Heart Disease Risk Table                     Men   Women  1/2 Average Risk   3.4   3.3  Average Risk       5.0   4.4  2 X Average Risk  9.6   7.1  3 X Average Risk  23.4   11.0        Use the calculated Patient Ratio above and the CHD Risk Table to determine the patient's CHD Risk.        ATP III CLASSIFICATION (LDL):  <100     mg/dL   Optimal  100-129  mg/dL   Near or Above                    Optimal  130-159  mg/dL   Borderline  160-189  mg/dL   High  >190     mg/dL   Very High Performed at [facility] [address], [place], [place] [address]   CBC with Differential/Platelet     Status: Abnormal   Collection Time: 12/26/16  7:01 AM  Result Value Ref Range   WBC 11.1 (H) 4.0 - 10.5 K/uL   RBC 2.30 (L) 3.87 - 5.11 MIL/uL   Hemoglobin 6.6 (LL) 12.0 - 15.0 g/dL    Comment: REPEATED TO VERIFY RESULT CHECKED CRITICAL RESULT CALLED TO, READ BACK BY AND VERIFIED WITH: WRESTLE, S. RN @0822  ON 6.1.18 BY NMCCOY    HCT 19.5 (L) 36.0 - 46.0 %   MCV 84.8 78.0 - 100.0 fL   MCH 28.7 26.0 - 34.0 pg   MCHC 33.8 30.0 - 36.0 g/dL   RDW 19.7 (H) 11.5 - 15.5 %   Platelets 101 (L) 150 - 400 K/uL    Comment: REPEATED TO VERIFY SPECIMEN CHECKED FOR CLOTS PLATELET COUNT CONFIRMED BY SMEAR    Neutrophils Relative % 83 %   Lymphocytes Relative 3 %   Monocytes Relative 10 %   Eosinophils Relative 0 %   Basophils Relative 0 %   Myelocytes 3 %   Blasts 1 %   Neutro Abs 9.5 (H) 1.7 - 7.7 K/uL   Lymphs Abs 0.3 (L) 0.7 - 4.0 K/uL   Monocytes Absolute 1.1 (H) 0.1 - 1.0 K/uL   Eosinophils Absolute 0.0  0.0 - 0.7 K/uL   Basophils Absolute 0.0 0.0 - 0.1 K/uL   RBC Morphology TARGET CELLS    WBC Morphology MILD LEFT SHIFT (1-5% METAS, OCC MYELO, OCC BANDS)     Comment: BLASTS  Glucose, capillary     Status: Abnormal   Collection Time: 12/26/16  7:42 AM  Result Value Ref Range   Glucose-Capillary 122 (H) 65 - 99 mg/dL  Troponin I (q 6hr x 3)     Status: Abnormal   Collection Time: 12/26/16 12:39 PM  Result Value Ref Range   Troponin I 0.08 (HH) <0.03 ng/mL    Comment: CRITICAL VALUE NOTED.  VALUE IS CONSISTENT WITH PREVIOUSLY REPORTED AND CALLED VALUE.  Basic metabolic panel     Status: Abnormal   Collection Time: 12/26/16 12:39 PM  Result Value Ref Range   Sodium 137 135 - 145 mmol/L   Potassium 5.4 (H) 3.5 - 5.1 mmol/L   Chloride 112 (H) 101 - 111 mmol/L   CO2 15 (L) 22 - 32 mmol/L   Glucose, Bld 177 (H) 65 - 99 mg/dL   BUN 55 (H) 6 - 20 mg/dL   Creatinine, Ser 2.99 (H) 0.44 - 1.00 mg/dL   Calcium 7.2 (L) 8.9 - 10.3 mg/dL   GFR calc non Af Amer 15 (L) >60 mL/min   GFR calc Af Amer 17 (L) >60 mL/min    Comment: (NOTE) The eGFR has been calculated using the CKD EPI  equation. This calculation has not been validated in all clinical situations. eGFR's persistently <60 mL/min signify possible Chronic Kidney Disease.    Anion gap 10 5 - 15  Occult blood card to lab, stool RN will collect     Status: None   Collection Time: 12/26/16  1:25 PM  Result Value Ref Range   Fecal Occult Bld NEGATIVE NEGATIVE    Ct Abdomen Pelvis Wo Contrast  Result Date: 12/15/2016 CLINICAL DATA:  Nausea vomiting and hypotension EXAM: CT ABDOMEN AND PELVIS WITHOUT CONTRAST TECHNIQUE: Multidetector CT imaging of the abdomen and pelvis was performed following the standard protocol without IV contrast. COMPARISON:  11/12/2016, PET-CT 10/30/2016, CT abdomen pelvis 01/16/2005 FINDINGS: Lower chest: Lung bases demonstrate no acute consolidation or pleural effusion. Large hiatal hernia. The heart is slightly  enlarged. There are coronary artery calcifications. Hepatobiliary: No intra hepatic biliary dilatation. Surgical clips in the gallbladder fossa. Stable enlarged extrahepatic common bile duct. Pancreas: Unremarkable. No pancreatic ductal dilatation or surrounding inflammatory changes. Spleen: Small splenic tissue in the left upper quadrant. Adrenals/Urinary Tract: Adrenal glands are within normal limits. Nonspecific perinephric fat stranding. Prominent renal pelvises bilaterally without ureteral stone. Small exophytic lesion mid right kidney. Small stones versus intrarenal vascular calcifications within both kidneys. The bladder is unremarkable. Stomach/Bowel: Stomach nonenlarged. No dilated small bowel. None questionable mild wall thickening involving the cecum, and ascending colon with minimal haziness in the adjacent pericolonic fat. Appendix not well visualized. Remainder of the colon shows no focal wall thickening. Sigmoid colon diverticula. Vascular/Lymphatic: Extensive vascular calcifications. Few nonspecific lymph nodes within the hiatal hernia fat. No significantly enlarged pelvic lymph nodes. Reproductive: Status post hysterectomy. No adnexal masses. Other: Soft tissue density with scattered calcifications extending to the skin, posterior to the left inferior sacrum as before. This measures up to 5 cm. A fat density mass was noted in the region on 2006 CT. No free air or free fluid. Musculoskeletal: Scoliosis of the spine. Status post right hip replacement with artifact and limiting factors. IMPRESSION: 1. Questionable mild wall thickening involving the cecum and ascending colon, difficult to exclude a mild colitis or enteritis. No evidence for a bowel obstruction. The remainder of the colon is within normal limits. 2. Post cholecystectomy with enlarged extrahepatic common bowel duct, likely due to post cholecystectomy change 3. Stable soft tissue mass with scattered calcifications posterior to the sacrum.  Electronically Signed   By: Donavan Foil M.D.   On: 12/17/2016 21:11   Dg Chest Port 1 View  Result Date: 12/22/2016 CLINICAL DATA:  77 year old presenting with acute onset of shortness of breath. EXAM: PORTABLE CHEST 1 VIEW COMPARISON:  12/05/2016, 11/06/2014 and earlier, including CT chest 10/09/2016 and earlier. PET-CT 10/30/2016. FINDINGS: Suboptimal inspiration accounts for crowded bronchovascular markings diffusely and atelectasis in the bases, and accentuates the cardiac silhouette. Taking this into account, cardiac silhouette upper normal in size to slightly enlarged but stable. The previously identified large hiatal hernia is less conspicuous due to technique. Lungs otherwise clear. Pulmonary vascularity normal. No visible pleural effusions. IMPRESSION: Suboptimal inspiration accounts for bibasilar atelectasis. No acute cardiopulmonary disease otherwise. Stable borderline heart size. Electronically Signed   By: Evangeline Dakin M.D.   On: 12/19/2016 18:23    ROS Blood pressure (!) 109/54, pulse 95, temperature 97.9 F (36.6 C), temperature source Oral, resp. rate (!) 22, height 5' 3"  (1.6 m), weight 59 kg (130 lb), SpO2 99 %. Physical Exam Physical Examination: General appearance - chronically ill appearing and pale, anxious Mental status -  alert, oriented to person, place, and time Eyes - pupils equal and reactive, extraocular eye movements intact, funduscopic exam normal, discs flat and sharp Mouth - no teeth, dry, atrophic Neck - adenopathy noted PCL Lymphatics - posterior cervical nodes Chest - decreased bs, scattered rhonchi Heart - S1 and S2 normal, decreased HS Abdomen - pos bs, lower abdm tender Musculoskeletal - no deformity, diffuse tender Extremities - peripheral pulses normal, no pedal edema, no clubbing or cyanosis Skin - pale, no rash  Assessment/Plan: 1 AKI  Low bps in setting of ACEI, Now acidemic on pressors, oliguric.  Will cont to expand and give bicarb. Low  ptlts disturbing in this setting.  Not typical of HSP, or atypical HUS (normal LDH) or TTP but if cont to drop ,needs heme eval. Will check Hapto as has been ^ in past. Need to r/o obstuct with hx UTIs, and Radrx.  Overall suspect hemodynamic 2 Septic syndrome, need to consider secondary effect ie HSP but most likely viral 3 Hypertension: not an issue 4. Anemia recheck LDH, Hapto 5. Colitis/enteritis 6 Severe COPD 7 DJD 8 Low ptlts  Worrisome 9 Malnutition p hapto, U/S, repeat UA, urine chem,  Check LFTs,  Iv Na bicarb isotonic  Sairah Knobloch L 12/26/2016, 3:09 PM

## 2016-12-26 DEATH — deceased

## 2016-12-27 ENCOUNTER — Inpatient Hospital Stay (HOSPITAL_COMMUNITY): Payer: Medicare HMO

## 2016-12-27 DIAGNOSIS — I959 Hypotension, unspecified: Secondary | ICD-10-CM

## 2016-12-27 DIAGNOSIS — K529 Noninfective gastroenteritis and colitis, unspecified: Secondary | ICD-10-CM

## 2016-12-27 DIAGNOSIS — J449 Chronic obstructive pulmonary disease, unspecified: Secondary | ICD-10-CM

## 2016-12-27 LAB — CBC WITH DIFFERENTIAL/PLATELET
BASOS PCT: 0 %
Band Neutrophils: 25 %
Basophils Absolute: 0 10*3/uL (ref 0.0–0.1)
Blasts: 1 %
EOS PCT: 0 %
Eosinophils Absolute: 0 10*3/uL (ref 0.0–0.7)
HEMATOCRIT: 21.1 % — AB (ref 36.0–46.0)
Hemoglobin: 7.3 g/dL — ABNORMAL LOW (ref 12.0–15.0)
LYMPHS ABS: 1.4 10*3/uL (ref 0.7–4.0)
LYMPHS PCT: 10 %
MCH: 29.2 pg (ref 26.0–34.0)
MCHC: 34.6 g/dL (ref 30.0–36.0)
MCV: 84.4 fL (ref 78.0–100.0)
MONO ABS: 1.6 10*3/uL — AB (ref 0.1–1.0)
MYELOCYTES: 1 %
Metamyelocytes Relative: 2 %
Monocytes Relative: 11 %
NEUTROS PCT: 50 %
NRBC: 0 /100{WBCs}
Neutro Abs: 11.2 10*3/uL — ABNORMAL HIGH (ref 1.7–7.7)
OTHER: 0 %
PLATELETS: 71 10*3/uL — AB (ref 150–400)
Promyelocytes Absolute: 0 %
RBC: 2.5 MIL/uL — AB (ref 3.87–5.11)
RDW: 19.1 % — AB (ref 11.5–15.5)
WBC: 14.3 10*3/uL — AB (ref 4.0–10.5)

## 2016-12-27 LAB — IRON AND TIBC
IRON: 10 ug/dL — AB (ref 28–170)
Saturation Ratios: 9 % — ABNORMAL LOW (ref 10.4–31.8)
TIBC: 111 ug/dL — AB (ref 250–450)
UIBC: 101 ug/dL

## 2016-12-27 LAB — TROPONIN I: TROPONIN I: 0.09 ng/mL — AB (ref <0.03)

## 2016-12-27 LAB — COMPREHENSIVE METABOLIC PANEL
ALBUMIN: 1.9 g/dL — AB (ref 3.5–5.0)
ALK PHOS: 225 U/L — AB (ref 38–126)
ALT: 31 U/L (ref 14–54)
AST: 81 U/L — AB (ref 15–41)
Anion gap: 9 (ref 5–15)
BILIRUBIN TOTAL: 0.6 mg/dL (ref 0.3–1.2)
BUN: 55 mg/dL — ABNORMAL HIGH (ref 6–20)
CALCIUM: 6.5 mg/dL — AB (ref 8.9–10.3)
CO2: 22 mmol/L (ref 22–32)
Chloride: 105 mmol/L (ref 101–111)
Creatinine, Ser: 3.14 mg/dL — ABNORMAL HIGH (ref 0.44–1.00)
GFR calc Af Amer: 16 mL/min — ABNORMAL LOW (ref >60)
GFR, EST NON AFRICAN AMERICAN: 14 mL/min — AB (ref >60)
GLUCOSE: 181 mg/dL — AB (ref 65–99)
Potassium: 4.7 mmol/L (ref 3.5–5.1)
Sodium: 136 mmol/L (ref 135–145)
TOTAL PROTEIN: 5.5 g/dL — AB (ref 6.5–8.1)

## 2016-12-27 LAB — HEMOGLOBIN A1C
HEMOGLOBIN A1C: 6.1 % — AB (ref 4.8–5.6)
Mean Plasma Glucose: 128 mg/dL

## 2016-12-27 LAB — HAPTOGLOBIN: Haptoglobin: 371 mg/dL — ABNORMAL HIGH (ref 34–200)

## 2016-12-27 LAB — GLUCOSE, CAPILLARY: Glucose-Capillary: 168 mg/dL — ABNORMAL HIGH (ref 65–99)

## 2016-12-27 LAB — PHOSPHORUS: Phosphorus: 4.1 mg/dL (ref 2.5–4.6)

## 2016-12-27 LAB — VITAMIN B12: VITAMIN B 12: 704 pg/mL (ref 180–914)

## 2016-12-27 LAB — PREPARE RBC (CROSSMATCH)

## 2016-12-27 LAB — URINE CULTURE: Culture: NO GROWTH

## 2016-12-27 MED ORDER — RISAQUAD PO CAPS
1.0000 | ORAL_CAPSULE | Freq: Every day | ORAL | Status: DC
  Administered 2016-12-27: 1 via ORAL
  Filled 2016-12-27: qty 1

## 2016-12-27 MED ORDER — LEVALBUTEROL HCL 0.63 MG/3ML IN NEBU: 0.6300 mg | INHALATION_SOLUTION | RESPIRATORY_TRACT | Status: DC | PRN

## 2016-12-27 MED ORDER — SODIUM CHLORIDE 0.9 % IV BOLUS (SEPSIS)
1000.0000 mL | Freq: Once | INTRAVENOUS | Status: AC
  Administered 2016-12-27: 1000 mL via INTRAVENOUS

## 2016-12-27 MED ORDER — DEXTROSE-NACL 5-0.9 % IV SOLN
INTRAVENOUS | Status: DC
  Administered 2016-12-27: 10:00:00 via INTRAVENOUS
  Administered 2016-12-27: 100 mL via INTRAVENOUS

## 2016-12-27 MED ORDER — METOPROLOL TARTRATE 5 MG/5ML IV SOLN
2.5000 mg | INTRAVENOUS | Status: DC | PRN
  Administered 2016-12-28 (×2): 2.5 mg via INTRAVENOUS
  Filled 2016-12-27: qty 5

## 2016-12-27 MED ORDER — FENTANYL CITRATE (PF) 100 MCG/2ML IJ SOLN
12.5000 ug | INTRAMUSCULAR | Status: DC | PRN
  Administered 2016-12-27 – 2016-12-28 (×2): 25 ug via INTRAVENOUS
  Filled 2016-12-27 (×2): qty 2

## 2016-12-27 MED ORDER — CHLORHEXIDINE GLUCONATE CLOTH 2 % EX PADS
6.0000 | MEDICATED_PAD | Freq: Every day | CUTANEOUS | Status: DC
  Administered 2016-12-27: 6 via TOPICAL

## 2016-12-27 MED ORDER — MORPHINE SULFATE (PF) 2 MG/ML IV SOLN
2.0000 mg | Freq: Once | INTRAVENOUS | Status: AC
  Administered 2016-12-28: 2 mg via INTRAVENOUS
  Filled 2016-12-27: qty 1

## 2016-12-27 NOTE — Assessment & Plan Note (Signed)
Repeat ct chest wo contrast 12/27/2016

## 2016-12-27 NOTE — Progress Notes (Signed)
Subjective: Interval History: ongoing D,  Breathing easier.  Objective: Vital signs in last 24 hours: Temp:  [96.8 F (36 C)-99 F (37.2 C)] 96.8 F (36 C) (06/02 0752) Pulse Rate:  [80-115] 83 (06/02 0700) Resp:  [15-27] 22 (06/02 0700) BP: (89-240)/(29-220) 106/54 (06/02 0700) SpO2:  [70 %-100 %] 96 % (06/02 0746) Weight change:   Intake/Output from previous day: 06/01 0701 - 06/02 0700 In: 2665.3 [I.V.:2330.3; Blood:335] Out: 140 [Urine:140] Intake/Output this shift: Total I/O In: -  Out: 100 [Urine:100]  General appearance: alert, cooperative, mildly obese and pale Resp: diminished breath sounds bilaterally and rhonchi bibasilar Cardio: S1, S2 normal and systolic murmur: holosystolic 2/6, blowing at apex GI: pos bs, mild tender Extremities: edema 1+  Lab Results:  Recent Labs  12/26/16 0701 12/26/16 1509 12/27/16 0512  WBC 11.1*  --  14.3*  HGB 6.6*  --  7.3*  HCT 19.5*  --  21.1*  PLT 101* 69* 71*   BMET:  Recent Labs  12/26/16 1239 12/27/16 0512  NA 137 136  K 5.4* 4.7  CL 112* 105  CO2 15* 22  GLUCOSE 177* 181*  BUN 55* 55*  CREATININE 2.99* 3.14*  CALCIUM 7.2* 6.5*   No results for input(s): PTH in the last 72 hours. Iron Studies:  Recent Labs  12/26/16 1530  IRON 10*  TIBC 111*    Studies/Results: Ct Abdomen Pelvis Wo Contrast  Result Date: 12/04/2016 CLINICAL DATA:  Nausea vomiting and hypotension EXAM: CT ABDOMEN AND PELVIS WITHOUT CONTRAST TECHNIQUE: Multidetector CT imaging of the abdomen and pelvis was performed following the standard protocol without IV contrast. COMPARISON:  11/12/2016, PET-CT 10/30/2016, CT abdomen pelvis 01/16/2005 FINDINGS: Lower chest: Lung bases demonstrate no acute consolidation or pleural effusion. Large hiatal hernia. The heart is slightly enlarged. There are coronary artery calcifications. Hepatobiliary: No intra hepatic biliary dilatation. Surgical clips in the gallbladder fossa. Stable enlarged  extrahepatic common bile duct. Pancreas: Unremarkable. No pancreatic ductal dilatation or surrounding inflammatory changes. Spleen: Small splenic tissue in the left upper quadrant. Adrenals/Urinary Tract: Adrenal glands are within normal limits. Nonspecific perinephric fat stranding. Prominent renal pelvises bilaterally without ureteral stone. Small exophytic lesion mid right kidney. Small stones versus intrarenal vascular calcifications within both kidneys. The bladder is unremarkable. Stomach/Bowel: Stomach nonenlarged. No dilated small bowel. None questionable mild wall thickening involving the cecum, and ascending colon with minimal haziness in the adjacent pericolonic fat. Appendix not well visualized. Remainder of the colon shows no focal wall thickening. Sigmoid colon diverticula. Vascular/Lymphatic: Extensive vascular calcifications. Few nonspecific lymph nodes within the hiatal hernia fat. No significantly enlarged pelvic lymph nodes. Reproductive: Status post hysterectomy. No adnexal masses. Other: Soft tissue density with scattered calcifications extending to the skin, posterior to the left inferior sacrum as before. This measures up to 5 cm. A fat density mass was noted in the region on 2006 CT. No free air or free fluid. Musculoskeletal: Scoliosis of the spine. Status post right hip replacement with artifact and limiting factors. IMPRESSION: 1. Questionable mild wall thickening involving the cecum and ascending colon, difficult to exclude a mild colitis or enteritis. No evidence for a bowel obstruction. The remainder of the colon is within normal limits. 2. Post cholecystectomy with enlarged extrahepatic common bowel duct, likely due to post cholecystectomy change 3. Stable soft tissue mass with scattered calcifications posterior to the sacrum. Electronically Signed   By: Donavan Foil M.D.   On: 12/02/2016 21:11   Ct Chest Wo Contrast  Result Date:  12/27/2016 CLINICAL DATA:  Chronic respiratory  failure. EXAM: CT CHEST WITHOUT CONTRAST TECHNIQUE: Multidetector CT imaging of the chest was performed following the standard protocol without IV contrast. COMPARISON:  10/09/2016 FINDINGS: Cardiovascular: Heart is enlarged. Coronary artery calcification is noted. Atherosclerotic calcification is noted in the wall of the thoracic aorta. Mediastinum/Nodes: Scattered small mediastinal lymph nodes. No evidence for gross hilar lymphadenopathy although assessment is limited by the lack of intravenous contrast on today's study. Fluid in the esophagus may be related to reflux or dysmotility. Patient is noted to have a moderate to large hiatal hernia. Lungs/Pleura: Centrilobular and paraseptal emphysema. Bilateral lower lobe collapse/ consolidation, right greater than left. Small bilateral pleural effusions, right greater than left. Upper Abdomen: Atherosclerotic calcification in the incompletely visualized abdominal aorta. Musculoskeletal: Bone windows reveal no worrisome lytic or sclerotic osseous lesions. IMPRESSION: 1. Bilateral lower lobe collapse/consolidation with bilateral pleural effusions. 2. 1 cm spiculated right lower lobe nodule seen on prior study obscured by the collapse/consolidation today. 3. Thoraco abdominal aortic atherosclerosis. 4. Emphysema. Electronically Signed   By: Misty Stanley M.D.   On: 12/27/2016 10:43   US Renal  Result Date: 12/26/2016 CLINICAL DATA:  Acute kidney injury EXAM: RENAL / URINARY TRACT ULTRASOUND COMPLETE COMPARISON:  CT from yesterday FINDINGS: Right Kidney: Length: 12 cm. Increased echogenicity. No hydronephrosis. Incidental 11 mm cyst. Left Kidney: Length: 12 cm.  Increased echogenicity.  No hydronephrosis or mass. Bladder: Not visualized.  Reportedly there is a Foley catheter present. IMPRESSION: 1. No hydronephrosis. 2. Echogenic cortex as with medical renal disease. Negative for atrophy. Electronically Signed   By: Monte Fantasia M.D.   On: 12/26/2016 19:01   Dg  Chest Port 1 View  Result Date: 12/26/2016 CLINICAL DATA:  Status post central line placement today. EXAM: PORTABLE CHEST 1 VIEW COMPARISON:  Single-view of the chest 12/24/2016. FINDINGS: New right IJ catheter is in place with the tip projecting in the mid to lower superior vena cava. No pneumothorax. Small right pleural effusion and basilar airspace disease are seen. The left lung appears clear. Heart size is normal. Atherosclerosis is identified. Hiatal hernia is noted. IMPRESSION: Tip of right IJ catheter projects in the mid to lower superior vena cava. No pneumothorax. Small right effusion and basilar airspace disease, likely atelectasis. Hiatal hernia. Atherosclerosis. Electronically Signed   By: Inge Rise M.D.   On: 12/26/2016 19:40   Dg Chest Port 1 View  Result Date: 12/06/2016 CLINICAL DATA:  76 year old presenting with acute onset of shortness of breath. EXAM: PORTABLE CHEST 1 VIEW COMPARISON:  12/05/2016, 11/06/2014 and earlier, including CT chest 10/09/2016 and earlier. PET-CT 10/30/2016. FINDINGS: Suboptimal inspiration accounts for crowded bronchovascular markings diffusely and atelectasis in the bases, and accentuates the cardiac silhouette. Taking this into account, cardiac silhouette upper normal in size to slightly enlarged but stable. The previously identified large hiatal hernia is less conspicuous due to technique. Lungs otherwise clear. Pulmonary vascularity normal. No visible pleural effusions. IMPRESSION: Suboptimal inspiration accounts for bibasilar atelectasis. No acute cardiopulmonary disease otherwise. Stable borderline heart size. Electronically Signed   By: Evangeline Dakin M.D.   On: 12/22/2016 18:23    I have reviewed the patient's current medications.  Assessment/Plan: 1 AKI Cr stable, making some urine.  U/S normal size but echodense.  FENA hypoperfusion.  WBC/RBC in urine.  Laural Golden.and shisto on smear.  ^ D-dimer, Fbrg, and coags ^, mild, falling ptlt.  Etiology  hemodynamic but also compatibe with TMA ie DIC, or HUS.  Not typical for  contrast. 2 TMA  Follow closely 3 COPD severe 4 anemia hemolysis, dilution 5 low bps improving 6 Sepsis like syndrome P ivf, follow counts, keep bicarb up, cont AB   LOS: 2 days   Doryce Mcgregory L 12/27/2016,10:53 AM

## 2016-12-27 NOTE — Progress Notes (Signed)
MD made aware of decreased urinary output, ~190 mL since 0800 this morning.  Orders provided to give pt a 1L bolus of NS.  Will continue to monitor.

## 2016-12-27 NOTE — Assessment & Plan Note (Addendum)
Stable without aecopd  planc Continue o2 for pulse ox > 88% Continue nebs No more daliresp for aecopd given weight loss and colitis

## 2016-12-27 NOTE — Assessment & Plan Note (Signed)
Anemia c/w chronic and critical illnes No evidence of bleeding or MI  Plan - PRBC for hgb </= 6.9gm%  - THEREFORE WILL DC PRBC ORDR (d/w Dr Quincy Simmonds)  - exceptions are   -  if ACS susepcted/confirmed then transfuse for hgb </= 8.0gm%,  or    -  active bleeding with hemodynamic instability, then transfuse regardless of hemoglobin value   At at all times try to transfuse 1 unit prbc as possible with exception of active hemorrhage

## 2016-12-27 NOTE — Consult Note (Addendum)
PULMONARY / CRITICAL CARE MEDICINE   Name: Pamela Hampton MRN: [id number] DOB: 02/27/1941    ADMISSION DATE:  11/29/2016 CONSULTATION DATE:  12/07/2016  REFERRING MD:  Dr. Blaine Hamper  CHIEF COMPLAINT:  Hypotension  brief 76 year old female with PMH as below, which is significant for COPD on 4L O2, HTN, hiatal hernia, vaginal cancer, and hepatitis. She has been experiencing nausea and vomiting for several weeks by report 3-4 times per day and described poor appetite for almost a year now. She presented to pulmonary clinic 5/31 with complaints of generalized pain, SOB, and cough. Dr. Chase Caller referred her to PCP for overall decline and felt that pulmonary problems were stable. Offered home hospice as well. She presented to her PCP office with these complaints and requested ambulance to be transported to ER.   In ER she was found to by hypotensive which responded to IVF. Laboratory eval significant for WBC 14.5, hemoglobin 7.6 which was 9.6 on 12/05/16, repeat 122, lactic acid to 1.69, potassium 7.2, 6.4, bicarbonate 17, acute renal injury with creatinine 3.19. She underwent CT scan of her abdomen due to vomiting and was found to have findings consistent with mild enteritis and a large hiatal hernia.     STUDIES:  Ct abd 5/31 > Questionable mild wall thickening involving the cecum and ascending colon, difficult to exclude a mild colitis or enteritis. No evidence for a bowel obstruction. The remainder of the colon is within normal limits. Post cholecystectomy with enlarged extrahepatic common bowel duct, likely due to post cholecystectomy change. Stable soft tissue mass with scattered calcifications posterior to the sacrum.  CULTURES: Blood 5/31 > Urine 5/31 > Sputum 5/31 >  ANTIBIOTICS: Ciprofloxacin 5/31 > Metronidazole 5/31 >  SIGNIFICANT EVENTS: 5/31 Admit 6/1 - ccm consult   SUBJECTIVE/OVERNIGHT/INTERVAL HX 12/27/16 - off pressors sinc emorning but bp running low. She is limietd code.  Creat unmimproved. Current hx notes: May 2017 started on daliresp for recurrent aecopd prophylasxis and then developed weight loss (seen in > 10-20% of patients). In aguust 2017 I asked her to stop the drug but she refused because it of percevid benefit. Noted to have large hiatal hernian. Colitis not reported with daliresp but nausa and diarreha reported 5-10%  Recently dealing with RLL nodule growth   VITAL SIGNS: BP (!) 106/54   Pulse 83   Temp (!) 96.8 F (36 C) (Axillary)   Resp (!) 22   Ht 5\' 3"  (1.6 m)   Wt 59 kg (130 lb)   SpO2 96%   BMI 23.03 kg/m   HEMODYNAMICS:    VENTILATOR SETTINGS:    INTAKE / OUTPUT: I/O last 3 completed shifts: In: 7412.6 [I.V.:2457.6; Blood:335; IV Piggyback:4620] Out: 265 [Urine:265]   EXAM  General Appearance:    Looks emaciiated  Head:    Normocephalic, without obvious abnormality, atraumatic  Eyes:    PERRL - yes, conjunctiva/corneas - clear left eye blind      Ears:    Normal external ear canals, both ears  Nose:   NG tube - no  Throat:  ETT TUBE - no , OG tube - no  Neck:   Supple,  No enlargement/tenderness/nodules     Lungs:     Clear to auscultation bilaterally, overall dimished, expiration prolonged  Chest wall:    No deformity  Heart:    S1 and S2 normal, no murmur, CVP - no.  Pressors - no - just off  Abdomen:     Soft, no masses, no  organomegaly  Genitalia:    Not done  Rectal:   not done  Extremities:   Extremities- no cyangosis     Skin:   Intact in exposed areas .     Neurologic:   Sedation - none -> RASS - +1 . Moves all 4s - yues. CAM-ICU - unable to assess . Orientation - unable to assess       LABS  PULMONARY  Recent Labs Lab 12/16/2016 1859  TCO2 21    CBC  Recent Labs Lab 12/24/2016 1958 12/26/16 0701 12/26/16 1509 12/27/16 0512  HGB 7.6* 6.6*  --  7.3*  HCT 21.9* 19.5*  --  21.1*  WBC 14.5* 11.1*  --  14.3*  PLT 122* 101* 69* 71*    COAGULATION  Recent Labs Lab 12/26/16 1509  INR  1.38    CARDIAC   Recent Labs Lab 12/26/16 0701 12/26/16 1239  TROPONINI 0.09* 0.08*   No results for input(s): PROBNP in the last 168 hours.   CHEMISTRY  Recent Labs Lab 12/07/2016 1958 11/29/2016 2326 12/26/16 0701 12/26/16 1239 12/27/16 0512  NA 134* 135 136 137 136  K 6.4* 6.4* 5.8* 5.4* 4.7  CL 107 109 110 112* 105  CO2 17* 16* 14* 15* 22  GLUCOSE 89 89 123* 177* 181*  BUN 58* 57* 54* 55* 55*  CREATININE 3.19* 3.06* 3.01* 2.99* 3.14*  CALCIUM 7.6* 7.5* 7.5* 7.2* 6.5*  PHOS  --   --   --   --  4.1   Estimated Creatinine Clearance: 13.2 mL/min (A) (by C-G formula based on SCr of 3.14 mg/dL (H)).   LIVER  Recent Labs Lab 12/08/2016 1958 12/26/16 1509 12/27/16 0512  AST 25  --  81*  ALT 19  --  31  ALKPHOS 229*  --  225*  BILITOT 0.6  --  0.6  PROT 6.1*  --  5.5*  ALBUMIN 1.8*  --  1.9*  INR  --  1.38  --      INFECTIOUS  Recent Labs Lab 11/27/2016 1859 11/29/2016 2229 12/26/16 0701  LATICACIDVEN 1.69 1.4  --   PROCALCITON  --   --  2.59     ENDOCRINE CBG (last 3)   Recent Labs  12/26/16 0742 12/27/16 0739  GLUCAP 122* 168*         IMAGING x48h  - image(s) personally visualized  -   highlighted in bold Ct Abdomen Pelvis Wo Contrast  Result Date: 12/03/2016 CLINICAL DATA:  Nausea vomiting and hypotension EXAM: CT ABDOMEN AND PELVIS WITHOUT CONTRAST TECHNIQUE: Multidetector CT imaging of the abdomen and pelvis was performed following the standard protocol without IV contrast. COMPARISON:  11/12/2016, PET-CT 10/30/2016, CT abdomen pelvis 01/16/2005 FINDINGS: Lower chest: Lung bases demonstrate no acute consolidation or pleural effusion. Large hiatal hernia. The heart is slightly enlarged. There are coronary artery calcifications. Hepatobiliary: No intra hepatic biliary dilatation. Surgical clips in the gallbladder fossa. Stable enlarged extrahepatic common bile duct. Pancreas: Unremarkable. No pancreatic ductal dilatation or surrounding  inflammatory changes. Spleen: Small splenic tissue in the left upper quadrant. Adrenals/Urinary Tract: Adrenal glands are within normal limits. Nonspecific perinephric fat stranding. Prominent renal pelvises bilaterally without ureteral stone. Small exophytic lesion mid right kidney. Small stones versus intrarenal vascular calcifications within both kidneys. The bladder is unremarkable. Stomach/Bowel: Stomach nonenlarged. No dilated small bowel. None questionable mild wall thickening involving the cecum, and ascending colon with minimal haziness in the adjacent pericolonic fat. Appendix not well visualized. Remainder of the colon  shows no focal wall thickening. Sigmoid colon diverticula. Vascular/Lymphatic: Extensive vascular calcifications. Few nonspecific lymph nodes within the hiatal hernia fat. No significantly enlarged pelvic lymph nodes. Reproductive: Status post hysterectomy. No adnexal masses. Other: Soft tissue density with scattered calcifications extending to the skin, posterior to the left inferior sacrum as before. This measures up to 5 cm. A fat density mass was noted in the region on 2006 CT. No free air or free fluid. Musculoskeletal: Scoliosis of the spine. Status post right hip replacement with artifact and limiting factors. IMPRESSION: 1. Questionable mild wall thickening involving the cecum and ascending colon, difficult to exclude a mild colitis or enteritis. No evidence for a bowel obstruction. The remainder of the colon is within normal limits. 2. Post cholecystectomy with enlarged extrahepatic common bowel duct, likely due to post cholecystectomy change 3. Stable soft tissue mass with scattered calcifications posterior to the sacrum. Electronically Signed   By: Donavan Foil M.D.   On: 11/27/2016 21:11   US Renal  Result Date: 12/26/2016 CLINICAL DATA:  Acute kidney injury EXAM: RENAL / URINARY TRACT ULTRASOUND COMPLETE COMPARISON:  CT from yesterday FINDINGS: Right Kidney: Length: 12 cm.  Increased echogenicity. No hydronephrosis. Incidental 11 mm cyst. Left Kidney: Length: 12 cm.  Increased echogenicity.  No hydronephrosis or mass. Bladder: Not visualized.  Reportedly there is a Foley catheter present. IMPRESSION: 1. No hydronephrosis. 2. Echogenic cortex as with medical renal disease. Negative for atrophy. Electronically Signed   By: Monte Fantasia M.D.   On: 12/26/2016 19:01   Dg Chest Port 1 View  Result Date: 12/26/2016 CLINICAL DATA:  Status post central line placement today. EXAM: PORTABLE CHEST 1 VIEW COMPARISON:  Single-view of the chest 11/25/2016. FINDINGS: New right IJ catheter is in place with the tip projecting in the mid to lower superior vena cava. No pneumothorax. Small right pleural effusion and basilar airspace disease are seen. The left lung appears clear. Heart size is normal. Atherosclerosis is identified. Hiatal hernia is noted. IMPRESSION: Tip of right IJ catheter projects in the mid to lower superior vena cava. No pneumothorax. Small right effusion and basilar airspace disease, likely atelectasis. Hiatal hernia. Atherosclerosis. Electronically Signed   By: Inge Rise M.D.   On: 12/26/2016 19:40   Dg Chest Port 1 View  Result Date: 11/29/2016 CLINICAL DATA:  76 year old presenting with acute onset of shortness of breath. EXAM: PORTABLE CHEST 1 VIEW COMPARISON:  12/05/2016, 11/06/2014 and earlier, including CT chest 10/09/2016 and earlier. PET-CT 10/30/2016. FINDINGS: Suboptimal inspiration accounts for crowded bronchovascular markings diffusely and atelectasis in the bases, and accentuates the cardiac silhouette. Taking this into account, cardiac silhouette upper normal in size to slightly enlarged but stable. The previously identified large hiatal hernia is less conspicuous due to technique. Lungs otherwise clear. Pulmonary vascularity normal. No visible pleural effusions. IMPRESSION: Suboptimal inspiration accounts for bibasilar atelectasis. No acute  cardiopulmonary disease otherwise. Stable borderline heart size. Electronically Signed   By: Evangeline Dakin M.D.   On: 12/09/2016 18:23      DISCUSSION: 76 year old female with chronic respiratory failure due to advanced COPD. Admitted for suspected gastroenteritis with am months worth of vomiting and poor PO intake. She was hypotensive in the ED which initially responded to volume, but eventually her BP dropped again and PCCM consulted.     ASSESSMENT and PLAN  Stage 4 very severe COPD by GOLD classification ([place]) Stable without aecopd  planc Continue o2 for pulse ox > 88% Continue nebs No more daliresp  for aecopd given weight loss and colitis  Colitis Unclear cause. Not reported with daliresp but lot of gi side effects of N, V , D and weight loss seen with this drug.   Plan No more daliresp  Normocytic anemia Anemia c/w chronic and critical illnes No evidence of bleeding or MI  Plan - PRBC for hgb </= 6.9gm%  - THEREFORE WILL DC PRBC ORDR (d/w Dr Quincy Simmonds)  - exceptions are   -  if ACS susepcted/confirmed then transfuse for hgb </= 8.0gm%,  or    -  active bleeding with hemodynamic instability, then transfuse regardless of hemoglobin value   At at all times try to transfuse 1 unit prbc as possible with exception of active hemorrhage    AKI (acute kidney injury) ([place]) Per renal but change morphine for pain to fent prn   Hypotension Likely septic and dehydration. COming off pressors 12/27/2016 8:48 AM but bp soft  Plan Map goal > 65  Nodule of right lung Repeat ct chest wo contrast 12/27/2016         FAMILY  - Updates: 12/27/2016 --> none at bedside  - Inter-disciplinary family meet or Palliative Care meeting due by:  DAy 7. Current LOS is LOS 2 days  CODE STATUS    Code Status Orders        Start     Ordered   12/26/16 1145  Do not attempt resuscitation (DNR)  Continuous    Question Answer Comment  In the event of cardiac or respiratory ARREST Do not  call a "code blue"   In the event of cardiac or respiratory ARREST Do not perform Intubation, CPR, defibrillation or ACLS   In the event of cardiac or respiratory ARREST Use medication by any route, position, wound care, and other measures to relive pain and suffering. May use oxygen, suction and manual treatment of airway obstruction as needed for comfort.      12/26/16 1144    Code Status History    Date Active Date Inactive Code Status Order ID Comments User Context   12/05/2016 11:01 PM 12/26/2016 11:44 AM Full Code [id number]  Ivor Costa, MD ED   11/28/2015 11:11 PM 11/30/2015  9:01 PM Full Code [id number]  Theressa Millard, MD Inpatient        DISPO Keep in ICU     The patient is critically ill with multiple organ systems failure and requires high complexity decision making for assessment and support, frequent evaluation and titration of therapies, application of advanced monitoring technologies and extensive interpretation of multiple databases.   Critical Care Time devoted to patient care services described in this note is  30  Minutes. This time reflects time of care of this signee Dr Brand Males. This critical care time does not reflect procedure time, or teaching time or supervisory time of PA/NP/Med student/Med Resident etc but could involve care discussion time    Dr. Brand Males, M.D., [facility].C.P Pulmonary and Critical Care Medicine Staff Physician [place] [facility] Pager: [phone], If no answer or between  15:00h - 7:00h: call [phone]  12/27/2016 8:33 AM

## 2016-12-27 NOTE — Assessment & Plan Note (Signed)
Unclear cause. Not reported with daliresp but lot of gi side effects of N, V , D and weight loss seen with this drug.   Plan No more daliresp

## 2016-12-27 NOTE — Progress Notes (Signed)
[facility] Progress Note Patient Name: Pamela Hampton DOB: [birth year] MRN: [id number]   Date of Service  12/27/2016  HPI/Events of Note  Intermittent AF/RVR with HR to the 150s with episodes of substernal CP.  BP 91/59 (65).  Had been on NEO gtt for BP support.  Slight elevation in trop.  eICU Interventions  Plan: PRN lopressor NEO for BP support MSO4 2 mg IV times one dose Cycle trop     Intervention Category Intermediate Interventions: Arrhythmia - evaluation and management  DETERDING,ELIZABETH 12/27/2016, 11:36 PM

## 2016-12-27 NOTE — Assessment & Plan Note (Addendum)
Per renal but change morphine for pain to fent prn

## 2016-12-27 NOTE — Assessment & Plan Note (Signed)
Likely septic and dehydration. COming off pressors 12/27/2016 8:48 AM but bp soft  Plan Map goal > 65

## 2016-12-27 NOTE — Progress Notes (Signed)
eLink Physician-Brief Progress Note Patient Name: Pamela Hampton DOB: [birth year] MRN: [id number]   Date of Service  12/27/2016  HPI/Events of Note  Patient with increased HR and c/o chest pain s/p Albuterol dose. Chest pain treated with Fentanyl IV with some relief. EKG - AFIB with RVR. Ventricular rate = 127. No evidence of ischemia. HR now = 111. Chest pain improved. Demand ischemia related to increased HR.  eICU Interventions  Will order: 1. Cycle Troponin. 2. D/C Albuterol PRN. 3. Xopenex 0.63 mg via neb Q 3 hours PRN.      Intervention Category Major Interventions: Arrhythmia - evaluation and management  Sommer,Steven Cornelia Copa 12/27/2016, 8:47 PM

## 2016-12-27 NOTE — Progress Notes (Signed)
PROGRESS NOTE [facility]   ERIKO ECONOMOS   WNU:[id number] DOB: October 09, 1940  DOA: 12/13/2016 PCP: Marletta Lor, MD   Brief Narrative:  CHAU SAVELL is a 76 y.o. female with medical history significant of hypertension, hyperlipidemia, COPD, chronic respiratory failure on 4 L oxygen, GERD, depression, stroke, seizure, ulcer, anemia, Barrett's esophagus, blood disorder (patient had some type of blood disorder per her daughter, but not sure which type, not treated), vaginal cancer (s/p of hysterectomy, radiation implant 1997, and chemotherapy), who presents with nausea, vomiting, abdominal pain and generalized weakness. In the ED patient was found to have a CT scan concerning for possible colitis or enteritis and was hypotensive with SBP to the 70s, hemoglobin of 7.6, creatinine 3.19 and potassium of 7.2. Patient was admitted for septic shock and acute renal failure.  Subjective: Patient seen and examined looks better this morning. Off pressors since this AM 12/27/16. Patient report her abdominal pain is better, although remains with diarrhea. Her breathing feel at baseline   Assessment & Plan: Septic shock due to enteritis/colitis - Off neo, BP remains soft, can try NS bolus to keep MAP > 65  Continue broad spectrum abx - Cipro and Flagyl  Daliresp d/ced can cause diarrhea, N/V  Will add Lactobacillus   AKI w/ hyperkalemia and metabolic acidosis -  Hyperkalemia has resolved, but Cr up today  HCO3 improved will d/c bicarb drip - switch to [facility]  Nephrology consult appreciated  Check Cr in AM    Elevated TNI  Demand ischemia, EKG with no specific ST changes, ECHO relative normal. No further cardiac work up at this time   Chronic hypoxia/COPD stage IV on 4 L - Stable  Oxygen requirement at baseline 4L [place]  Continue current regimen     Anemia multifactorial - chronic iron deffiency  Anemia panel pending  S/p 1 unit of PRBC  Transfuse if Hgb < 7 unless than patient is  actively bleeding  FOBT - negative  DVT prophylaxis: SCD's Code Status: DNR, I have discussed with son whom is POA and confirm this status  Family Communication: None at bedside  Disposition Plan: unable to determine at this time   Consultants:   PCCM   Nephrology   Procedures:   ECHO   Antimicrobials: Anti-infectives    Start     Dose/Rate Route Frequency Ordered Stop   12/26/16 0000  ciprofloxacin (CIPRO) IVPB 400 mg     400 mg 200 mL/hr over 60 Minutes Intravenous Daily at bedtime 12/01/2016 2350     12/24/2016 2359  metroNIDAZOLE (FLAGYL) IVPB 500 mg     500 mg 100 mL/hr over 60 Minutes Intravenous Every 8 hours 12/09/2016 2304        Objective: Vitals:   12/27/16 0700 12/27/16 0745 12/27/16 0746 12/27/16 0752  BP: (!) 106/54     Pulse: 83     Resp: (!) 22     Temp:    (!) 96.8 F (36 C)  TempSrc:    Axillary  SpO2: 98% 98% 96%   Weight:      Height:        Intake/Output Summary (Last 24 hours) at 12/27/16 0916 Last data filed at 12/27/16 0800  Gross per 24 hour  Intake          2534.75 ml  Output              220 ml  Net          2314.75 ml  Filed Weights   12/24/2016 1658  Weight: 59 kg (130 lb)    Examination:  General exam: NAD comfortable  Respiratory system: Lungs clear but air entry diminish b/l  Cardiovascular system: S1S2 no murmurs  Gastrointestinal system: Abd soft NT ND  Central nervous system: AAOx 3 Extremities: No LE edema Skin: No rash or lesions  Psychiatry: Mood appropriate     Data Reviewed: I have personally reviewed following labs and imaging studies  CBC:  Recent Labs Lab 12/24/2016 1859 12/16/2016 1958 12/26/16 0701 12/26/16 1509 12/27/16 0512  WBC  --  14.5* 11.1*  --  14.3*  NEUTROABS  --  11.3* 9.5*  --  11.2*  HGB 17.7* 7.6* 6.6*  --  7.3*  HCT 52.0* 21.9* 19.5*  --  21.1*  MCV  --  83.6 84.8  --  84.4  PLT  --  122* 101* 69* 71*   Basic Metabolic Panel:  Recent Labs Lab 12/15/2016 1958 12/21/2016 2326  12/26/16 0701 12/26/16 1239 12/27/16 0512  NA 134* 135 136 137 136  K 6.4* 6.4* 5.8* 5.4* 4.7  CL 107 109 110 112* 105  CO2 17* 16* 14* 15* 22  GLUCOSE 89 89 123* 177* 181*  BUN 58* 57* 54* 55* 55*  CREATININE 3.19* 3.06* 3.01* 2.99* 3.14*  CALCIUM 7.6* 7.5* 7.5* 7.2* 6.5*  PHOS  --   --   --   --  4.1   GFR: Estimated Creatinine Clearance: 13.2 mL/min (A) (by C-G formula based on SCr of 3.14 mg/dL (H)). Liver Function Tests:  Recent Labs Lab 11/26/2016 1958 12/27/16 0512  AST 25 81*  ALT 19 31  ALKPHOS 229* 225*  BILITOT 0.6 0.6  PROT 6.1* 5.5*  ALBUMIN 1.8* 1.9*    Recent Labs Lab 12/02/2016 1958  LIPASE 26   Coagulation Profile:  Recent Labs Lab 12/26/16 1509  INR 1.38   Cardiac Enzymes:  Recent Labs Lab 12/26/16 0701 12/26/16 1239 12/26/16 1530  CKTOTAL  --   --  34*  TROPONINI 0.09* 0.08*  --    BNP (last 3 results) No results for input(s): PROBNP in the last 8760 hours. HbA1C:  Recent Labs  12/26/16 0701  HGBA1C 6.1*   CBG:  Recent Labs Lab 12/26/16 0742 12/27/16 0739  GLUCAP 122* 168*   Lipid Profile:  Recent Labs  12/26/16 0701  CHOL 65  HDL 14*  LDLCALC 30  TRIG 103  CHOLHDL 4.6   Sepsis Labs:  Recent Labs Lab 12/19/2016 1859 12/16/2016 2229 12/26/16 0701  PROCALCITON  --   --  2.59  LATICACIDVEN 1.69 1.4  --     Recent Results (from the past 240 hour(s))  MRSA PCR Screening     Status: None   Collection Time: 12/21/2016 11:25 PM  Result Value Ref Range Status   MRSA by PCR NEGATIVE NEGATIVE Final    Comment:        The GeneXpert MRSA Assay (FDA approved for NASAL specimens only), is one component of a comprehensive MRSA colonization surveillance program. It is not intended to diagnose MRSA infection nor to guide or monitor treatment for MRSA infections.      Radiology Studies: Ct Abdomen Pelvis Wo Contrast  Result Date: 11/29/2016 CLINICAL DATA:  Nausea vomiting and hypotension EXAM: CT ABDOMEN AND PELVIS  WITHOUT CONTRAST TECHNIQUE: Multidetector CT imaging of the abdomen and pelvis was performed following the standard protocol without IV contrast. COMPARISON:  11/12/2016, PET-CT 10/30/2016, CT abdomen pelvis 01/16/2005 FINDINGS: Lower chest: Lung  bases demonstrate no acute consolidation or pleural effusion. Large hiatal hernia. The heart is slightly enlarged. There are coronary artery calcifications. Hepatobiliary: No intra hepatic biliary dilatation. Surgical clips in the gallbladder fossa. Stable enlarged extrahepatic common bile duct. Pancreas: Unremarkable. No pancreatic ductal dilatation or surrounding inflammatory changes. Spleen: Small splenic tissue in the left upper quadrant. Adrenals/Urinary Tract: Adrenal glands are within normal limits. Nonspecific perinephric fat stranding. Prominent renal pelvises bilaterally without ureteral stone. Small exophytic lesion mid right kidney. Small stones versus intrarenal vascular calcifications within both kidneys. The bladder is unremarkable. Stomach/Bowel: Stomach nonenlarged. No dilated small bowel. None questionable mild wall thickening involving the cecum, and ascending colon with minimal haziness in the adjacent pericolonic fat. Appendix not well visualized. Remainder of the colon shows no focal wall thickening. Sigmoid colon diverticula. Vascular/Lymphatic: Extensive vascular calcifications. Few nonspecific lymph nodes within the hiatal hernia fat. No significantly enlarged pelvic lymph nodes. Reproductive: Status post hysterectomy. No adnexal masses. Other: Soft tissue density with scattered calcifications extending to the skin, posterior to the left inferior sacrum as before. This measures up to 5 cm. A fat density mass was noted in the region on 2006 CT. No free air or free fluid. Musculoskeletal: Scoliosis of the spine. Status post right hip replacement with artifact and limiting factors. IMPRESSION: 1. Questionable mild wall thickening involving the cecum  and ascending colon, difficult to exclude a mild colitis or enteritis. No evidence for a bowel obstruction. The remainder of the colon is within normal limits. 2. Post cholecystectomy with enlarged extrahepatic common bowel duct, likely due to post cholecystectomy change 3. Stable soft tissue mass with scattered calcifications posterior to the sacrum. Electronically Signed   By: Donavan Foil M.D.   On: 11/30/2016 21:11   US Renal  Result Date: 12/26/2016 CLINICAL DATA:  Acute kidney injury EXAM: RENAL / URINARY TRACT ULTRASOUND COMPLETE COMPARISON:  CT from yesterday FINDINGS: Right Kidney: Length: 12 cm. Increased echogenicity. No hydronephrosis. Incidental 11 mm cyst. Left Kidney: Length: 12 cm.  Increased echogenicity.  No hydronephrosis or mass. Bladder: Not visualized.  Reportedly there is a Foley catheter present. IMPRESSION: 1. No hydronephrosis. 2. Echogenic cortex as with medical renal disease. Negative for atrophy. Electronically Signed   By: Monte Fantasia M.D.   On: 12/26/2016 19:01   Dg Chest Port 1 View  Result Date: 12/26/2016 CLINICAL DATA:  Status post central line placement today. EXAM: PORTABLE CHEST 1 VIEW COMPARISON:  Single-view of the chest 12/11/2016. FINDINGS: New right IJ catheter is in place with the tip projecting in the mid to lower superior vena cava. No pneumothorax. Small right pleural effusion and basilar airspace disease are seen. The left lung appears clear. Heart size is normal. Atherosclerosis is identified. Hiatal hernia is noted. IMPRESSION: Tip of right IJ catheter projects in the mid to lower superior vena cava. No pneumothorax. Small right effusion and basilar airspace disease, likely atelectasis. Hiatal hernia. Atherosclerosis. Electronically Signed   By: Inge Rise M.D.   On: 12/26/2016 19:40   Dg Chest Port 1 View  Result Date: 12/03/2016 CLINICAL DATA:  76 year old presenting with acute onset of shortness of breath. EXAM: PORTABLE CHEST 1 VIEW  COMPARISON:  12/05/2016, 11/06/2014 and earlier, including CT chest 10/09/2016 and earlier. PET-CT 10/30/2016. FINDINGS: Suboptimal inspiration accounts for crowded bronchovascular markings diffusely and atelectasis in the bases, and accentuates the cardiac silhouette. Taking this into account, cardiac silhouette upper normal in size to slightly enlarged but stable. The previously identified large hiatal hernia is less  conspicuous due to technique. Lungs otherwise clear. Pulmonary vascularity normal. No visible pleural effusions. IMPRESSION: Suboptimal inspiration accounts for bibasilar atelectasis. No acute cardiopulmonary disease otherwise. Stable borderline heart size. Electronically Signed   By: Evangeline Dakin M.D.   On: 12/07/2016 18:23    Scheduled Meds: . budesonide (PULMICORT) nebulizer solution  0.5 mg Nebulization BID  . Chlorhexidine Gluconate Cloth  6 each Topical Daily  . fluticasone  1 spray Each Nare Daily  . ipratropium-albuterol  3 mL Nebulization QID  . mouth rinse  15 mL Mouth Rinse BID  . pantoprazole (PROTONIX) IV  40 mg Intravenous Daily  . predniSONE  10 mg Oral Q breakfast  . roflumilast  500 mcg Oral Daily  . sodium chloride flush  3 mL Intravenous Q12H   Continuous Infusions: . ciprofloxacin Stopped (12/26/16 2342)  . metronidazole Stopped (12/27/16 0106)  . phenylephrine (NEO-SYNEPHRINE) Adult infusion 10 mcg/min (12/27/16 0408)  .  sodium bicarbonate  infusion 1000 mL 125 mL/hr at 12/27/16 0407     LOS: 2 days    Chipper Oman, MD Pager: Text Page via [email or web address]  [phone]  If 7PM-7AM, please contact night-coverage [email or web address] Password [facility] 12/27/2016, 9:16 AM

## 2016-12-27 NOTE — Progress Notes (Signed)
Pamela Hampton   DOB:1941/07/05   HO#:[id number]    Subjective: The patient is sleepy.  She felt more energy after blood transfusion.  She denies abdominal pain.  Objective:  Vitals:   12/27/16 0700 12/27/16 0752  BP: (!) 106/54   Pulse: 83   Resp: (!) 22   Temp:  (!) 96.8 F (36 C)     Intake/Output Summary (Last 24 hours) at 12/27/16 0827 Last data filed at 12/27/16 0300  Gross per 24 hour  Intake          2534.75 ml  Output              120 ml  Net          2414.75 ml    GENERAL:alert, no distress and comfortable SKIN: skin color is pale, texture, turgor are normal, no rashes or significant lesions. Noted central cyanosis EYES: normal, Conjunctiva are pale and non-injected, sclera clear OROPHARYNX:no exudate, no erythema and lips, buccal mucosa, and tongue normal  NECK: supple, thyroid normal size, non-tender, without nodularity LYMPH:  no palpable lymphadenopathy in the cervical, axillary or inguinal LUNGS: clear to auscultation and percussion with normal breathing effort HEART: regular rate & rhythm and no murmurs and no lower extremity edema ABDOMEN:abdomen soft, non-tender and normal bowel sounds Musculoskeletal:no cyanosis of digits and no clubbing  NEURO: alert & oriented x 3 with fluent speech, no focal motor/sensory deficits   Labs:  Lab Results  Component Value Date   WBC 14.3 (H) 12/27/2016   HGB 7.3 (L) 12/27/2016   HCT 21.1 (L) 12/27/2016   MCV 84.4 12/27/2016   PLT 71 (L) 12/27/2016   NEUTROABS 11.2 (H) 12/27/2016    Lab Results  Component Value Date   NA 136 12/27/2016   K 4.7 12/27/2016   CL 105 12/27/2016   CO2 22 12/27/2016    Studies:  Ct Abdomen Pelvis Wo Contrast  Result Date: 12/06/2016 CLINICAL DATA:  Nausea vomiting and hypotension EXAM: CT ABDOMEN AND PELVIS WITHOUT CONTRAST TECHNIQUE: Multidetector CT imaging of the abdomen and pelvis was performed following the standard protocol without IV contrast. COMPARISON:  11/12/2016, PET-CT  10/30/2016, CT abdomen pelvis 01/16/2005 FINDINGS: Lower chest: Lung bases demonstrate no acute consolidation or pleural effusion. Large hiatal hernia. The heart is slightly enlarged. There are coronary artery calcifications. Hepatobiliary: No intra hepatic biliary dilatation. Surgical clips in the gallbladder fossa. Stable enlarged extrahepatic common bile duct. Pancreas: Unremarkable. No pancreatic ductal dilatation or surrounding inflammatory changes. Spleen: Small splenic tissue in the left upper quadrant. Adrenals/Urinary Tract: Adrenal glands are within normal limits. Nonspecific perinephric fat stranding. Prominent renal pelvises bilaterally without ureteral stone. Small exophytic lesion mid right kidney. Small stones versus intrarenal vascular calcifications within both kidneys. The bladder is unremarkable. Stomach/Bowel: Stomach nonenlarged. No dilated small bowel. None questionable mild wall thickening involving the cecum, and ascending colon with minimal haziness in the adjacent pericolonic fat. Appendix not well visualized. Remainder of the colon shows no focal wall thickening. Sigmoid colon diverticula. Vascular/Lymphatic: Extensive vascular calcifications. Few nonspecific lymph nodes within the hiatal hernia fat. No significantly enlarged pelvic lymph nodes. Reproductive: Status post hysterectomy. No adnexal masses. Other: Soft tissue density with scattered calcifications extending to the skin, posterior to the left inferior sacrum as before. This measures up to 5 cm. A fat density mass was noted in the region on 2006 CT. No free air or free fluid. Musculoskeletal: Scoliosis of the spine. Status post right hip replacement with artifact and limiting factors.  IMPRESSION: 1. Questionable mild wall thickening involving the cecum and ascending colon, difficult to exclude a mild colitis or enteritis. No evidence for a bowel obstruction. The remainder of the colon is within normal limits. 2. Post  cholecystectomy with enlarged extrahepatic common bowel duct, likely due to post cholecystectomy change 3. Stable soft tissue mass with scattered calcifications posterior to the sacrum. Electronically Signed   By: Donavan Foil M.D.   On: 12/17/2016 21:11   US Renal  Result Date: 12/26/2016 CLINICAL DATA:  Acute kidney injury EXAM: RENAL / URINARY TRACT ULTRASOUND COMPLETE COMPARISON:  CT from yesterday FINDINGS: Right Kidney: Length: 12 cm. Increased echogenicity. No hydronephrosis. Incidental 11 mm cyst. Left Kidney: Length: 12 cm.  Increased echogenicity.  No hydronephrosis or mass. Bladder: Not visualized.  Reportedly there is a Foley catheter present. IMPRESSION: 1. No hydronephrosis. 2. Echogenic cortex as with medical renal disease. Negative for atrophy. Electronically Signed   By: Monte Fantasia M.D.   On: 12/26/2016 19:01   Dg Chest Port 1 View  Result Date: 12/26/2016 CLINICAL DATA:  Status post central line placement today. EXAM: PORTABLE CHEST 1 VIEW COMPARISON:  Single-view of the chest 12/19/2016. FINDINGS: New right IJ catheter is in place with the tip projecting in the mid to lower superior vena cava. No pneumothorax. Small right pleural effusion and basilar airspace disease are seen. The left lung appears clear. Heart size is normal. Atherosclerosis is identified. Hiatal hernia is noted. IMPRESSION: Tip of right IJ catheter projects in the mid to lower superior vena cava. No pneumothorax. Small right effusion and basilar airspace disease, likely atelectasis. Hiatal hernia. Atherosclerosis. Electronically Signed   By: Inge Rise M.D.   On: 12/26/2016 19:40   Dg Chest Port 1 View  Result Date: 12/24/2016 CLINICAL DATA:  76 year old presenting with acute onset of shortness of breath. EXAM: PORTABLE CHEST 1 VIEW COMPARISON:  12/05/2016, 11/06/2014 and earlier, including CT chest 10/09/2016 and earlier. PET-CT 10/30/2016. FINDINGS: Suboptimal inspiration accounts for crowded  bronchovascular markings diffusely and atelectasis in the bases, and accentuates the cardiac silhouette. Taking this into account, cardiac silhouette upper normal in size to slightly enlarged but stable. The previously identified large hiatal hernia is less conspicuous due to technique. Lungs otherwise clear. Pulmonary vascularity normal. No visible pleural effusions. IMPRESSION: Suboptimal inspiration accounts for bibasilar atelectasis. No acute cardiopulmonary disease otherwise. Stable borderline heart size. Electronically Signed   By: Evangeline Dakin M.D.   On: 11/25/2016 18:23    Assessment & Plan:   Severe anemia Multifactorial, likely background of iron deficiency anemia with severe anemia associated with renal failure The presentation is not compatible with hemolytic anemia I recommend blood transfusion to keep hemoglobin greater than 8 but would defer to primary service  Acute thrombocytopenia Likely due to sepsis/consumption This is not compatible with ITP, TTP or HUS No need for transfusion unless platelet count less than 20,000 or the patient have signs of bleeding  Elevated troponin Recent severe abdominal pain with abnormal CT imaging All these findings are likely induced by ischemia secondary to severe anemia  The abnormal changes on the CT could be due to ischemic colitis  Acute renal failure with hyperkalemia Likely secondary to contrast exposure on 12/05/2016 Could be also related to sepsis Agree with aggressive IV fluid resuscitation and management of hyperkalemia  Severe protein calorie malnutrition Defer to dietitian  Abnormal PET CT scan 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

## 2016-12-28 LAB — RENAL FUNCTION PANEL
Albumin: 1.8 g/dL — ABNORMAL LOW (ref 3.5–5.0)
Anion gap: 12 (ref 5–15)
BUN: 56 mg/dL — AB (ref 6–20)
CHLORIDE: 107 mmol/L (ref 101–111)
CO2: 19 mmol/L — AB (ref 22–32)
CREATININE: 3.1 mg/dL — AB (ref 0.44–1.00)
Calcium: 5.8 mg/dL — CL (ref 8.9–10.3)
GFR calc Af Amer: 16 mL/min — ABNORMAL LOW (ref >60)
GFR, EST NON AFRICAN AMERICAN: 14 mL/min — AB (ref >60)
GLUCOSE: 129 mg/dL — AB (ref 65–99)
POTASSIUM: 4.2 mmol/L (ref 3.5–5.1)
Phosphorus: 4.5 mg/dL (ref 2.5–4.6)
Sodium: 138 mmol/L (ref 135–145)

## 2016-12-28 LAB — CBC
HEMATOCRIT: 20.9 % — AB (ref 36.0–46.0)
Hemoglobin: 7.3 g/dL — ABNORMAL LOW (ref 12.0–15.0)
MCH: 29.2 pg (ref 26.0–34.0)
MCHC: 34.9 g/dL (ref 30.0–36.0)
MCV: 83.6 fL (ref 78.0–100.0)
PLATELETS: 64 10*3/uL — AB (ref 150–400)
RBC: 2.5 MIL/uL — ABNORMAL LOW (ref 3.87–5.11)
RDW: 19.4 % — AB (ref 11.5–15.5)
WBC: 14.5 10*3/uL — AB (ref 4.0–10.5)

## 2016-12-28 LAB — HAPTOGLOBIN: Haptoglobin: 374 mg/dL — ABNORMAL HIGH (ref 34–200)

## 2016-12-28 LAB — LACTATE DEHYDROGENASE: LDH: 941 U/L — ABNORMAL HIGH (ref 98–192)

## 2016-12-28 LAB — TROPONIN I: Troponin I: 0.09 ng/mL (ref <0.03)

## 2016-12-28 MED ORDER — CALCIUM GLUCONATE 10 % IV SOLN
2.0000 g | Freq: Once | INTRAVENOUS | Status: AC
Start: 2016-12-28 — End: 2016-12-28
  Administered 2016-12-28: 2 g via INTRAVENOUS
  Filled 2016-12-28: qty 20

## 2016-12-28 MED ORDER — SODIUM CHLORIDE 0.9 % IV SOLN
510.0000 mg | Freq: Once | INTRAVENOUS | Status: DC
  Filled 2016-12-28: qty 17

## 2016-12-29 LAB — POCT I-STAT, CHEM 8
BUN: 82 mg/dL — ABNORMAL HIGH (ref 6–20)
CREATININE: 3.2 mg/dL — AB (ref 0.44–1.00)
Calcium, Ion: 1.05 mmol/L — ABNORMAL LOW (ref 1.15–1.40)
Chloride: 105 mmol/L (ref 101–111)
Glucose, Bld: 85 mg/dL (ref 65–99)
HCT: 52 % — ABNORMAL HIGH (ref 36.0–46.0)
Hemoglobin: 17.7 g/dL — ABNORMAL HIGH (ref 12.0–15.0)
Potassium: 7.2 mmol/L (ref 3.5–5.1)
Sodium: 131 mmol/L — ABNORMAL LOW (ref 135–145)
TCO2: 21 mmol/L (ref 0–100)

## 2016-12-29 LAB — HEPATITIS C ANTIBODY (REFLEX)

## 2016-12-29 LAB — HCV COMMENT:

## 2016-12-29 LAB — HEPATITIS B SURFACE ANTIGEN: HEP B S AG: NEGATIVE

## 2016-12-30 LAB — BPAM RBC
BLOOD PRODUCT EXPIRATION DATE: 201806202359
Blood Product Expiration Date: 201806252359
Blood Product Expiration Date: 201806252359
ISSUE DATE / TIME: 201806011614
Unit Type and Rh: 5100
Unit Type and Rh: 5100
Unit Type and Rh: 5100

## 2016-12-30 LAB — TYPE AND SCREEN
ABO/RH(D): O POS
Antibody Screen: NEGATIVE
Unit division: 0
Unit division: 0
Unit division: 0

## 2016-12-30 LAB — FOLATE RBC
Folate, Hemolysate: 176 ng/mL
Folate, RBC: UNDETERMINED ng/mL

## 2016-12-30 LAB — HEMATOLOGY COMMENTS:

## 2016-12-31 LAB — CULTURE, BLOOD (ROUTINE X 2)
CULTURE: NO GROWTH
Culture: NO GROWTH
Special Requests: ADEQUATE
Special Requests: ADEQUATE

## 2017-01-07 ENCOUNTER — Other Ambulatory Visit: Payer: Medicare HMO

## 2017-01-08 ENCOUNTER — Ambulatory Visit: Payer: Medicare HMO | Admitting: Acute Care

## 2017-01-25 NOTE — Progress Notes (Signed)
CRITICAL VALUE ALERT  Critical Value:  Calcium 5.8  Date & Time Notied:  01-04-17. 0400  Provider Notified: Dr. Jimmy Footman  Orders Received/Actions taken: replaced

## 2017-01-25 NOTE — Progress Notes (Signed)
[id number] Patient with no respirations or heart sounds auscultated.  Death pronounced by Prince Rome, RN and Jari Favre, RN.  Family and MD notified.

## 2017-01-25 NOTE — Progress Notes (Signed)
At about 6:25AM patient heart rate increased to the 150s and 170 intermittently. EKG was completed, showed Afib and another showed sinus Tach, patient was given a total of 5mg  metoprolol IV. Patient went into V-tach, and then brady down to the 50s. Her daughter Pamela Hampton was notified of these events and advised to come in and stay with patient. Patient complained of inability to breath, oxygen was increased from 4L to 6L and now to non-rebreather mask. Patient's lips became cyanotic, as her breathing became shallow and labored. Daughter was called again and informed of the situation. Staff remained at patient's bedside through this event.

## 2017-01-25 NOTE — ED Provider Notes (Signed)
[name] DEPT Provider Note   CSN: [id number] Arrival date & time: 11/26/2016  1626     History   Chief Complaint Chief Complaint  Patient presents with  . Nausea  . Emesis  . Hypotension  . Altered Mental Status    HPI Pamela Hampton is a 76 y.o. female. Chief complaint is low blood pressure weakness.  76 year old female with a history of hypertension, COPD, chronic respiratory failure on 4 L nasal cannula, hyperlipidemia, anemia, pedicles or disease, seizures, stroke, depression, Barrett's esophagus. Daughter states that she has been sick for several weeks nausea and vomiting per day increasing weakness epigastric abdominal pain shortness of breath. Seen by her pulmonologist today and referred for outpatient CT. Also was hypotensive and referred to her primary care. At primary care's office was hypotensive and was brought by EMS to [facility].  ED Course: pt was found to have  hypotension with SBP 70s-80s which responded to IV fluid resuscitation, and improved to SBP above 90, WBC 14.5, hemoglobin 7.6 which was 9.6 on 12/05/16, repeat 122, lactic acid to 1.69, potassium 7.2, 6.4, bicarbonate 17, acute renal injury with creatinine 3.19, BUN 58, normal abdomen, pending urinalysis, temperature normal, bradycardia, oxygen saturation 91-97% on 4 L oxygen, negative chest x-ray for acute abnormalities, lipase 26, negative FOBT. CT abdomen/pelvis showed possible colitis or enteritis. Patient is admitted to stepdown as inpatient. Per EDP, lab called and reported abnormal blood cell abnormality, but needs pulmonologist to review for diagnosis.     HPI  Past Medical History:  Diagnosis Date  . Allergy    Rhinitis  . Anemia   . Arthritis   . Barrett's esophagus 07/19/2004  . Blood transfusion   . COPD (chronic obstructive pulmonary disease) (HCC)    wears 3 liters of o2 continious  . Depression   . Emphysema of lung ([place])   . GERD (gastroesophageal reflux disease)    Barrett's esophagus  . Hepatitis   . Hiatal hernia   . History of oxygen administration    oxygen concentrator @ 4 l/m nasally 24/ 7  . Hyperlipidemia   . Hypertension   . Impaired hearing   . Obesity   . Osteoporosis   . Pneumonia   . Seizures ([place])   . Sleep apnea   . Stroke ([place])   . Ulcer   . Vaginal cancer ([place])    radiation implant '97. radiation. chemo.    Patient Active Problem List   Diagnosis Date Noted  . Colitis 12/27/2016  . Elevated troponin 12/26/2016  . Back pain 12/26/2016  . Sepsis ([place]) 12/01/2016  . Hyperkalemia 12/19/2016  . Hypotension 12/09/2016  . Nausea & vomiting 12/20/2016  . AKI (acute kidney injury) ([place]) 12/10/2016  . Abdominal pain 11/26/2016  . Normocytic anemia 12/05/2016  . COPD (chronic obstructive pulmonary disease) ([place]) 12/08/2016  . Nodule of right lung 04/11/2016  . Loss of weight 03/11/2016  . Stopped smoking with greater than 40 pack year history 03/11/2016  . Pressure ulcer stage II 11/29/2015  . Otalgia 03/20/2014  . Stage 4 very severe COPD by GOLD classification ([place]) 03/13/2014  . Chronic respiratory failure with hypoxia ([place]) 03/13/2014  . Personal history of arteriovenous malformation (AVM) 11/26/2011  . AVM (arteriovenous malformation) 11/26/2011  . Stricture and stenosis of esophagus 10/20/2011  . BARRETTS ESOPHAGUS 06/04/2009  . Iron deficiency anemia 05/07/2009  . VITAMIN B12 DEFICIENCY 08/13/2007  . HLD (hyperlipidemia) 05/04/2006  . Depression 05/04/2006  . Essential hypertension 05/04/2006  . GERD  05/04/2006  . Osteoporosis 05/04/2006  . SEIZURE DISORDER 05/04/2006    Past Surgical History:  Procedure Laterality Date  . ABDOMINAL HYSTERECTOMY    . abdominal laproscopy surgery    . CHOLECYSTECTOMY    . ESOPHAGOGASTRODUODENOSCOPY (EGD) WITH PROPOFOL N/A 12/21/2014   Procedure: ESOPHAGOGASTRODUODENOSCOPY (EGD) WITH PROPOFOL;  Surgeon: Inda Castle, MD;  Location: [facility] ENDOSCOPY;  Service: Endoscopy;   Laterality: N/A;  . HEMORRHOID SURGERY    . JOINT REPLACEMENT    . ORIF HIP FRACTURE Right   . TOTAL HIP ARTHROPLASTY Right   . 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 Authorizing Provider  albuterol (PROVENTIL) (2.5 MG/3ML) 0.083% nebulizer solution INHALE THE CONTENTS OF 1 VIAL VIA NEBULIZER 4 TIMES A DAY. 11/08/15  Yes Brand Males, MD  beclomethasone (QVAR) 80 MCG/ACT inhaler Inhale 2 puffs into the lungs 2 (two) times daily. 12/01/14  Yes Brand Males, MD  DALIRESP 500 MCG TABS tablet TAKE ONE TABLET BY MOUTH ONCE DAILY 12/08/16  Yes Brand Males, MD  escitalopram (LEXAPRO) 10 MG tablet TAKE ONE TABLET BY MOUTH ONCE DAILY 10/28/16  Yes Marletta Lor, MD  ferrous sulfate 325 (65 FE) MG tablet Take 325 mg by mouth 2 (two) times daily. 12/03/16  Yes [provider]  fluticasone (FLONASE) 50 MCG/ACT nasal spray PLACE 2 SPRAYS INTO BOTH NOSTRILS DAILY. 07/06/15  Yes Marletta Lor, MD  ibandronate (BONIVA) 150 MG tablet take 1 tablet by mouth in the morning on an empty stomach once monthly 10/02/16  Yes Marletta Lor, MD  ipratropium (ATROVENT) 0.02 % nebulizer solution INHALE THE CONTENTS OF 1 VIAL VIA NEBULIZER 4 TIMES A DAY. 10/17/16  Yes Brand Males, MD  omeprazole (PRILOSEC) 20 MG capsule TAKE ONE CAPSULE BY MOUTH ONCE DAILY 12/08/16  Yes Brand Males, MD  OXYGEN Inhale 4 L/min into the lungs continuous.   Yes [provider]  potassium chloride SA (K-DUR,KLOR-CON) 20 MEQ tablet TAKE ONE TABLET BY MOUTH ONCE DAILY 12/03/16  Yes Marletta Lor, MD  rosuvastatin (CRESTOR) 10 MG tablet TAKE ONE TABLET BY MOUTH ONCE DAILY 12/08/16  Yes Marin Olp, MD    traZODone (DESYREL) 100 MG tablet Take 1 tablet (100 mg total) by mouth at bedtime. 10/03/16  Yes Marletta Lor, MD  trimethoprim (TRIMPEX) 100 MG tablet Take 100 mg by mouth daily. 10/27/16  Yes [provider]  ondansetron (ZOFRAN) 4 MG tablet Take 1 tablet (4 mg total) by mouth every 8 (eight) hours as needed for nausea or vomiting. 12/12/16   Marletta Lor, MD  polyethylene glycol powder (GLYCOLAX/MIRALAX) powder Take 17 g by mouth 2 (two) times daily as needed. Patient taking differently: Take 17 g by mouth daily as needed for mild constipation.  08/30/14   Marletta Lor, MD  PROAIR HFA 108 [phone] Base) MCG/ACT inhaler inhale TWO PUFFS BY MOUTH EVERY 4 HOURS AS NEEDED for SHORTNESS OF BREATH 10/28/16   Marletta Lor, MD    Family History Family History  Problem Relation Age of Onset  . Heart disease Mother  MI  . Diabetes Mother   . Uterine cancer Mother   . Anemia Mother   . Hypertension Mother   . Stomach cancer Father   . Esophageal cancer Father   . Hypertension Father   . Diabetes Sister   . Anemia Sister   . Esophageal cancer Brother   . Hypertension Brother   . Diabetes Son   . Diabetes Son   . Brain cancer Brother   . Lung cancer Sister   . Liver cancer Sister   . Colon cancer Neg Hx   . Rectal cancer Neg Hx     Social History Social History  Substance Use Topics  . Smoking status: Former Smoker    Packs/day: 2.00    Years: 30.00    Types: Cigarettes    Quit date: 07/28/2002  . Smokeless tobacco: Never Used  . Alcohol use No     Allergies   Patient has no known allergies.   Review of Systems Review of Systems  Constitutional: Positive for fatigue. Negative for appetite change, chills, diaphoresis and fever.  HENT: Negative for mouth sores, sore throat and trouble swallowing.   Eyes: Negative for visual disturbance.  Respiratory: Positive for shortness of breath. Negative for cough, chest tightness and wheezing.    Cardiovascular: Negative for chest pain.  Gastrointestinal: Positive for nausea and vomiting. Negative for abdominal distention, abdominal pain and diarrhea.  Endocrine: Negative for polydipsia, polyphagia and polyuria.  Genitourinary: Negative for dysuria, frequency and hematuria.  Musculoskeletal: Negative for gait problem.  Skin: Negative for color change, pallor and rash.  Neurological: Positive for weakness. Negative for dizziness, syncope, light-headedness and headaches.  Hematological: Does not bruise/bleed easily.  Psychiatric/Behavioral: Negative for behavioral problems and confusion.     Physical Exam Updated Vital Signs BP (!) 97/43   Pulse (!) 135   Temp 98.2 F (36.8 C) (Oral)   Resp (!) 0   Ht 5\' 3"  (1.6 m)   Wt 59 kg (130 lb 1.1 oz)   SpO2 95%   BMI 23.04 kg/m   Physical Exam  Constitutional: She is oriented to person, place, and time. She appears well-developed and well-nourished. No distress.  HENT:  Head: Normocephalic.  Eyes: Conjunctivae are normal. Pupils are equal, round, and reactive to light. No scleral icterus.  Neck: Normal range of motion. Neck supple. No thyromegaly present.  Cardiovascular: Normal rate and regular rhythm.  Exam reveals no gallop and no friction rub.   No murmur heard. Pulmonary/Chest: Effort normal and breath sounds normal. No respiratory distress. She has no wheezes. She has no rales.  Tachypneic.  Abdominal: Soft. Bowel sounds are normal. She exhibits no distension. There is no tenderness. There is no rebound.  Musculoskeletal: Normal range of motion.  Neurological: She is alert and oriented to person, place, and time.  Skin: Skin is warm and dry. No rash noted.  Psychiatric: She has a normal mood and affect. Her behavior is normal.     ED Treatments / Results  Labs (all labs ordered are listed, but only abnormal results are displayed) Labs Reviewed  URINALYSIS, ROUTINE W REFLEX MICROSCOPIC - Abnormal; Notable for the  following:       Result Value   Color, Urine AMBER (*)    APPearance CLOUDY (*)    Hgb urine dipstick MODERATE (*)    Bilirubin Urine SMALL (*)    Protein, ur 100 (*)    Leukocytes, UA SMALL (*)    Bacteria, UA MANY (*)  Squamous Epithelial / LPF 0-5 (*)    All other components within normal limits  CBC WITH DIFFERENTIAL/PLATELET - Abnormal; Notable for the following:    WBC 14.5 (*)    RBC 2.62 (*)    Hemoglobin 7.6 (*)    HCT 21.9 (*)    RDW 19.1 (*)    Platelets 122 (*)    Neutro Abs 11.3 (*)    Monocytes Absolute 2.2 (*)    All other components within normal limits  BASIC METABOLIC PANEL - Abnormal; Notable for the following:    Sodium 134 (*)    Potassium 6.4 (*)    CO2 17 (*)    BUN 58 (*)    Creatinine, Ser 3.19 (*)    Calcium 7.6 (*)    GFR calc non Af Amer 13 (*)    GFR calc Af Amer 16 (*)    All other components within normal limits  HEPATIC FUNCTION PANEL - Abnormal; Notable for the following:    Total Protein 6.1 (*)    Albumin 1.8 (*)    Alkaline Phosphatase 229 (*)    All other components within normal limits  HAPTOGLOBIN - Abnormal; Notable for the following:    Haptoglobin 371 (*)    All other components within normal limits  BASIC METABOLIC PANEL - Abnormal; Notable for the following:    Potassium 6.4 (*)    CO2 16 (*)    BUN 57 (*)    Creatinine, Ser 3.06 (*)    Calcium 7.5 (*)    GFR calc non Af Amer 14 (*)    GFR calc Af Amer 16 (*)    All other components within normal limits  BASIC METABOLIC PANEL - Abnormal; Notable for the following:    Potassium 5.8 (*)    CO2 14 (*)    Glucose, Bld 123 (*)    BUN 54 (*)    Creatinine, Ser 3.01 (*)    Calcium 7.5 (*)    GFR calc non Af Amer 14 (*)    GFR calc Af Amer 17 (*)    All other components within normal limits  C-REACTIVE PROTEIN - Abnormal; Notable for the following:    CRP 25.2 (*)    All other components within normal limits  TROPONIN I - Abnormal; Notable for the following:     Troponin I 0.09 (*)    All other components within normal limits  TROPONIN I - Abnormal; Notable for the following:    Troponin I 0.08 (*)    All other components within normal limits  HEMOGLOBIN A1C - Abnormal; Notable for the following:    Hgb A1c MFr Bld 6.1 (*)    All other components within normal limits  LIPID PANEL - Abnormal; Notable for the following:    HDL 14 (*)    All other components within normal limits  CBC WITH DIFFERENTIAL/PLATELET - Abnormal; Notable for the following:    WBC 11.1 (*)    RBC 2.30 (*)    Hemoglobin 6.6 (*)    HCT 19.5 (*)    RDW 19.7 (*)    Platelets 101 (*)    Neutro Abs 9.5 (*)    Lymphs Abs 0.3 (*)    Monocytes Absolute 1.1 (*)    All other components within normal limits  GLUCOSE, CAPILLARY - Abnormal; Notable for the following:    Glucose-Capillary 122 (*)    All other components within normal limits  BASIC METABOLIC PANEL - Abnormal; Notable for  the following:    Potassium 5.4 (*)    Chloride 112 (*)    CO2 15 (*)    Glucose, Bld 177 (*)    BUN 55 (*)    Creatinine, Ser 2.99 (*)    Calcium 7.2 (*)    GFR calc non Af Amer 15 (*)    GFR calc Af Amer 17 (*)    All other components within normal limits  CK - Abnormal; Notable for the following:    Total CK 34 (*)    All other components within normal limits  URIC ACID - Abnormal; Notable for the following:    Uric Acid, Serum 7.6 (*)    All other components within normal limits  HAPTOGLOBIN - Abnormal; Notable for the following:    Haptoglobin 374 (*)    All other components within normal limits  IRON AND TIBC - Abnormal; Notable for the following:    Iron 10 (*)    TIBC 111 (*)    Saturation Ratios 9 (*)    All other components within normal limits  URINALYSIS, ROUTINE W REFLEX MICROSCOPIC - Abnormal; Notable for the following:    APPearance TURBID (*)    Hgb urine dipstick MODERATE (*)    Protein, ur 100 (*)    Leukocytes, UA LARGE (*)    Bacteria, UA MANY (*)    Squamous  Epithelial / LPF 0-5 (*)    All other components within normal limits  DIC (DISSEMINATED INTRAVASCULAR COAGULATION) PANEL - Abnormal; Notable for the following:    Prothrombin Time 17.1 (*)    aPTT 40 (*)    Fibrinogen 580 (*)    D-Dimer, Quant 14.65 (*)    Platelets 69 (*)    All other components within normal limits  LACTATE DEHYDROGENASE - Abnormal; Notable for the following:    LDH 344 (*)    All other components within normal limits  CBC WITH DIFFERENTIAL/PLATELET - Abnormal; Notable for the following:    WBC 14.3 (*)    RBC 2.50 (*)    Hemoglobin 7.3 (*)    HCT 21.1 (*)    RDW 19.1 (*)    Platelets 71 (*)    Neutro Abs 11.2 (*)    Monocytes Absolute 1.6 (*)    All other components within normal limits  COMPREHENSIVE METABOLIC PANEL - Abnormal; Notable for the following:    Glucose, Bld 181 (*)    BUN 55 (*)    Creatinine, Ser 3.14 (*)    Calcium 6.5 (*)    Total Protein 5.5 (*)    Albumin 1.9 (*)    AST 81 (*)    Alkaline Phosphatase 225 (*)    GFR calc non Af Amer 14 (*)    GFR calc Af Amer 16 (*)    All other components within normal limits  GLUCOSE, CAPILLARY - Abnormal; Notable for the following:    Glucose-Capillary 168 (*)    All other components within normal limits  RENAL FUNCTION PANEL - Abnormal; Notable for the following:    CO2 19 (*)    Glucose, Bld 129 (*)    BUN 56 (*)    Creatinine, Ser 3.10 (*)    Calcium 5.8 (*)    Albumin 1.8 (*)    GFR calc non Af Amer 14 (*)    GFR calc Af Amer 16 (*)    All other components within normal limits  CBC - Abnormal; Notable for the following:    WBC 14.5 (*)  RBC 2.50 (*)    Hemoglobin 7.3 (*)    HCT 20.9 (*)    RDW 19.4 (*)    Platelets 64 (*)    All other components within normal limits  LACTATE DEHYDROGENASE - Abnormal; Notable for the following:    LDH 941 (*)    All other components within normal limits  TROPONIN I - Abnormal; Notable for the following:    Troponin I 0.09 (*)    All other  components within normal limits  TROPONIN I - Abnormal; Notable for the following:    Troponin I 0.09 (*)    All other components within normal limits  I-STAT CHEM 8, ED - Abnormal; Notable for the following:    Sodium 131 (*)    Potassium 7.2 (*)    BUN 82 (*)    Creatinine, Ser 3.20 (*)    Calcium, Ion 1.05 (*)    Hemoglobin 17.7 (*)    HCT 52.0 (*)    All other components within normal limits  I-STAT TROPOININ, ED - Abnormal; Notable for the following:    Troponin i, poc 0.14 (*)    All other components within normal limits  POCT I-STAT, CHEM 8 - Abnormal; Notable for the following:    Sodium 131 (*)    Potassium 7.2 (*)    BUN 82 (*)    Creatinine, Ser 3.20 (*)    Calcium, Ion 1.05 (*)    Hemoglobin 17.7 (*)    HCT 52.0 (*)    All other components within normal limits  CULTURE, BLOOD (ROUTINE X 2)  CULTURE, BLOOD (ROUTINE X 2)  URINE CULTURE  MRSA PCR SCREENING  LIPASE, BLOOD  CREATININE, URINE, RANDOM  SODIUM, URINE, RANDOM  LACTATE DEHYDROGENASE  PATHOLOGIST SMEAR REVIEW  LACTIC ACID, PLASMA  PROCALCITONIN  OCCULT BLOOD X 1 CARD TO LAB, STOOL  CREATININE, URINE, RANDOM  SODIUM, URINE, RANDOM  VITAMIN B12  FOLATE RBC  HEPATITIS B SURFACE ANTIGEN  HEPATITIS C ANTIBODY (REFLEX)  PHOSPHORUS  HCV COMMENT:  HEMATOLOGY COMMENTS:  CBC WITH DIFFERENTIAL/PLATELET  I-STAT CG4 LACTIC ACID, ED  TYPE AND SCREEN  PREPARE RBC (CROSSMATCH)  PREPARE RBC (CROSSMATCH)    EKG  EKG Interpretation  Date/Time:  Thursday Dec 25 2016 20:24:41 EDT Ventricular Rate:  81 PR Interval:    QRS Duration: 95 QT Interval:  373 QTC Calculation: 433 R Axis:   62 Text Interpretation:  Sinus rhythm Atrial premature complex Borderline low voltage, extremity leads Nonspecific T abnrm, anterolateral leads Confirmed by Tanna Furry [id number]) on 11/29/2016 8:28:45 PM       Radiology No results found.  Procedures Procedures (including critical care time)  Medications Ordered in  ED Medications  sodium chloride 0.9 % bolus 1,000 mL (1,000 mLs Intravenous New Bag/Given 12/16/2016 1917)  ondansetron (ZOFRAN) injection 4 mg (4 mg Intravenous Given 12/13/2016 1917)  sodium chloride 0.9 % bolus 1,000 mL (0 mLs Intravenous Stopped 12/06/2016 2028)  0.9 %  sodium chloride infusion ( Intravenous Stopped 12/26/16 0600)  dextrose 50 % solution 50 mL (50 mLs Intravenous Given 12/26/16 0123)  calcium gluconate 2 g in sodium chloride 0.9 % 100 mL IVPB (0 g Intravenous Stopped 12/26/16 0245)  insulin aspart (novoLOG) injection 5 Units (5 Units Intravenous Given 12/26/16 0122)  albumin human 25 % solution 50 g (0 g Intravenous Stopped 12/26/16 0442)  sodium chloride 0.9 % bolus 1,000 mL (0 mLs Intravenous Stopped 12/26/16 0200)  sodium polystyrene (KAYEXALATE) 15 GM/60ML suspension 30 g (30 g  Oral Given 12/26/16 0358)  sodium chloride 0.9 % bolus 1,000 mL (0 mLs Intravenous Stopped 12/26/16 0530)  0.9 %  sodium chloride infusion ( Intravenous Stopped 12/27/16 1709)  sodium chloride 0.9 % bolus 1,000 mL (1,000 mLs Intravenous New Bag/Given 12/27/16 1727)  morphine 2 MG/ML injection 2 mg (2 mg Intravenous Given 01/08/2017 0000)  calcium gluconate 2 g in sodium chloride 0.9 % 100 mL IVPB (0 g Intravenous Stopped 08-Jan-2017 0538)     Initial Impression / Assessment and Plan / ED Course  I have reviewed the triage vital signs and the nursing notes.  Pertinent labs & imaging results that were available during my care of the patient were reviewed by me and considered in my medical decision making (see chart for details).   patient hypotensive, acute renal failure, hyperkalemic. Given IV fluids and blood pressure improves. Give IV calcium. Given nebulized albuterol. Sepsis labs drawn.  Discussed with hospitalist. Patient is to be admitted.  CRITICAL CARE Performed by: Tanna Furry JOSEPH   Total critical care time: 30 minutes  Critical care time was exclusive of separately billable procedures and treating other  patients.  Critical care was necessary to treat or prevent imminent or life-threatening deterioration.  Critical care was time spent personally by me on the following activities: development of treatment plan with patient and/or surrogate as well as nursing, discussions with consultants, evaluation of patient's response to treatment, examination of patient, obtaining history from patient or surrogate, ordering and performing treatments and interventions, ordering and review of laboratory studies, ordering and review of radiographic studies, pulse oximetry and re-evaluation of patient's condition. care  Final Clinical Impressions(s) / ED Diagnoses   Final diagnoses:  AKI (acute kidney injury) ([place])  Encounter for central line placement  Nodule of right lung  Iron deficiency anemia due to chronic blood loss    New Prescriptions Discharge Medication List as of 08-Jan-2017  1:53 PM       Tanna Furry, MD 01/05/17 0006

## 2017-01-25 NOTE — Progress Notes (Signed)
[facility] Progress Note Patient Name: Pamela Hampton DOB: 01-Mar-1941 MRN: [id number]   Date of Service  01/06/17  HPI/Events of Note  Hypocalcemia  eICU Interventions  Calcium replaced     Intervention Category Intermediate Interventions: Electrolyte abnormality - evaluation and management  Andrus Sharp January 06, 2017, 4:18 AM

## 2017-01-25 NOTE — Progress Notes (Signed)
[facility] arrived to lend support to family after pt passed. Pt's son, daughters, grandchildren and daughter-in-law were expected to arrive before they leave pt. Family was appropriately tearful, but thankful to know their mother is in heaven. Family wanted prayer. During our visit following prayer, pt's pastor and his wife arrived along w/other family members. Pt's pastor was grateful for Chaplain support of family prior to his arrival. Provided emotional, spiritual and hydration to family. Please page if additional support is needed. Chaplain Ernest Haber, M.Div.   2017/01/07 0900  Clinical Encounter Type  Visited With Family

## 2017-01-25 NOTE — Death Summary Note (Signed)
DEATH SUMMARY   Patient Details  Name: Pamela Hampton MRN: [id number] DOB: [birth year]  Admission/Discharge Information   Admit Date:  2017-01-12  Date of Death: Date of Death: 2017/01/15  Time of Death: Time of Death: 0732  Length of Stay: 3  Referring Physician: Marletta Lor, MD   Reason(s) for Hospitalization  Septic Shock due to enteritis/colitis  Diagnoses  Preliminary cause of death:  Secondary Diagnoses (including complications and co-morbidities):  Principal Problem:   Sepsis ([place]) Active Problems:   HLD (hyperlipidemia)   Iron deficiency anemia   Depression   Essential hypertension   GERD   Stage 4 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 year old female who Presented to the emergency department complaining nausea, vomiting, abdominal pain and generalized weakness. In the ED CT scan of the abdomen was done concerning for possible colitis or enteritis. She was noted to be hypotensive with SBP to the 70s and hemoglobin 7.6. She also was found to be on acute kidney failure with creatinine of 3.19 and potassium of 7.2. Patient was admitted to the ICU for septic shock was started on pressors and IVF for acute renal failure. Blood pressure improved but renal failure continued to progress. She was also found to be anemic for which she was transfused 1 unit of PRBCs with good response. She was evaluated by nephrologist whom recommended IV hydration for her acute renal failure but no improvement was noted. Oncology evaluation  was requested with the thought of hemolytic uremic syndrome, in oncology's opinion patient did not have any hemolytic issues, and thrombocytopenia with anemia was suspected due to sepsis with bone marrow suppression. The night of Jan 15, 2017 she developed tachycardia went into A. fib with elevated in troponins. This was thought to be secondary to demand ischemia due to septic shock. Patient became bradycardic with junctional rhythms and tachypneic and she was declared dead at 7:32 AM. Patient was DO NOT RESUSCITATE and no heroic measures were taken.  Pertinent Labs and Studies  Significant Diagnostic Studies Ct Abdomen Pelvis Wo Contrast  Result Date: 2017-01-12 CLINICAL DATA:  Nausea vomiting and hypotension EXAM: CT ABDOMEN AND PELVIS WITHOUT CONTRAST TECHNIQUE: Multidetector CT imaging of the abdomen and pelvis was performed following the standard protocol without IV contrast. COMPARISON:  11/12/2016, PET-CT 10/30/2016, CT abdomen pelvis 01/16/2005 FINDINGS: Lower chest: Lung bases demonstrate no acute consolidation or pleural effusion. Large hiatal hernia. The heart is slightly enlarged. There are coronary artery calcifications. Hepatobiliary: No intra hepatic biliary dilatation. Surgical clips in the gallbladder fossa. Stable enlarged extrahepatic common bile duct. Pancreas: Unremarkable. No pancreatic ductal dilatation or surrounding inflammatory changes. Spleen: Small splenic tissue in the left upper quadrant. Adrenals/Urinary Tract: Adrenal glands are within normal limits. Nonspecific perinephric fat stranding. Prominent renal pelvises bilaterally without ureteral stone. Small exophytic lesion mid right kidney. Small stones versus intrarenal vascular calcifications within both kidneys. The bladder is unremarkable. Stomach/Bowel: Stomach nonenlarged. No dilated small bowel. None questionable mild wall thickening involving the cecum, and ascending colon with  minimal haziness in the adjacent pericolonic  fat. Appendix not well visualized. Remainder of the colon shows no focal wall thickening. Sigmoid colon diverticula. Vascular/Lymphatic: Extensive vascular calcifications. Few nonspecific lymph nodes within the hiatal hernia fat. No significantly enlarged pelvic lymph nodes. Reproductive: Status post hysterectomy. No adnexal masses. Other: Soft tissue density with scattered calcifications extending to the skin, posterior to the left inferior sacrum as before. This measures up to 5 cm. A fat density mass was noted in the region on 2006 CT. No free air or free fluid. Musculoskeletal: Scoliosis of the spine. Status post right hip replacement with artifact and limiting factors. IMPRESSION: 1. Questionable mild wall thickening involving the cecum and ascending colon, difficult to exclude a mild colitis or enteritis. No evidence for a bowel obstruction. The remainder of the colon is within normal limits. 2. Post cholecystectomy with enlarged extrahepatic common bowel duct, likely due to post cholecystectomy change 3. Stable soft tissue mass with scattered calcifications posterior to the sacrum. Electronically Signed   By: Donavan Foil M.D.   On: 12/10/2016 21:11   Ct Angio Head W Or Wo Contrast  Result Date: 12/05/2016 CLINICAL DATA:  76 y/o  F; nontraumatic neck pain. EXAM: CT ANGIOGRAPHY HEAD AND NECK TECHNIQUE: Multidetector CT imaging of the head and neck was performed using the standard protocol during bolus administration of intravenous contrast. Multiplanar CT image reconstructions and MIPs were obtained to evaluate the vascular anatomy. Carotid stenosis measurements (when applicable) are obtained utilizing NASCET criteria, using the distal internal carotid diameter as the denominator. CONTRAST:  100 cc Isovue 370 COMPARISON:  03/18/2007 CT head FINDINGS: CT HEAD FINDINGS Brain: No evidence of acute infarction, hemorrhage, hydrocephalus, extra-axial collection or mass lesion/mass effect. Stable chronic lacunar  infarct within the left anterior lentiform nucleus and periventricular white matter of left frontal lobe. Mild chronic microvascular ischemic changes and parenchymal volume loss of the brain. Vascular: Extensive calcific atherosclerosis of carotid siphons. Skull: Normal. Negative for fracture or focal lesion. Sinuses: Mild ethmoid sinus mucosal thickening. Right-greater-than-left mastoid effusions. Orbits: No acute finding. Review of the MIP images confirms the above findings CTA NECK FINDINGS Aortic arch: Standard branching. Imaged portion shows no evidence of aneurysm or dissection. Moderate calcific atherosclerosis of the aortic arch. Calcified plaque of left subclavian artery origin with 30% stenosis. Right carotid system: No evidence of dissection, stenosis (50% or greater) or occlusion. Mixed plaque of common carotid artery and of the carotid bifurcation with minimal less than 30% stenosis of proximal ICA. Left carotid system: Motion degraded left common carotid artery origin, question mild stenosis. Mixed plaque of common carotid artery and of the carotid bifurcation with mild 40% proximal ICA stenosis. Vertebral arteries: Mild left dominance. No evidence of dissection, stenosis (50% or greater) or occlusion. Calcified plaque of left distal V1 segment with mild stenosis. Skeleton: Mild cervical spondylosis for age greatest at the C5 through C7 levels. No high-grade bony canal stenosis. Other neck: Subcentimeter nodules in the bilateral lobes of the thyroid gland. Upper chest: Moderate pulmonary emphysema. Review of the MIP images confirms the above findings CTA HEAD FINDINGS Anterior circulation: No significant stenosis, proximal occlusion, aneurysm, or vascular malformation. Moderate calcific atherosclerosis of bilateral cavernous and paraclinoid segments of the internal carotid arteries with areas of mild stenosis. Posterior circulation: No significant stenosis, proximal occlusion, aneurysm, or vascular  malformation. Venous sinuses: As permitted by contrast timing, patent. Anatomic variants: Patent anterior communicating artery and bilateral posterior communicating arteries. Delayed phase: No abnormal intracranial enhancement. Review of  the MIP images confirms the above findings IMPRESSION: 1. No acute intracranial abnormality identified. No abnormal enhancement of the brain. 2. Mild chronic microvascular ischemic changes and mild parenchymal volume loss of the brain. 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 3. Right-greater-than-left mastoid air cell effusions. 4. Patent carotid and vertebral arteries. No dissection, aneurysm, or significant stenosis is identified. 5. Mixed plaque of bilateral carotid bifurcations with proximal ICA 30-40% mild stenosis. 6. No mass or inflammatory process of the neck identified. 7. Mild cervical spine spondylosis for age greatest at the C5 through C7 levels. No high-grade bony canal stenosis. 8. Patent circle of Willis. No large vessel occlusion, aneurysm, or significant stenosis is identified. Electronically Signed   By: Kristine Garbe M.D.   On: 12/05/2016 23:38   Dg Chest 2 View  Result Date: 12/05/2016 CLINICAL DATA:  Cough.  Neck pain. EXAM: CHEST  2 VIEW COMPARISON:  Chest CT 10/09/2016 FINDINGS: Hyperinflation and emphysema. The right lower lobe pulmonary nodule on CT is not well seen radiographically. Unchanged heart size and mediastinal contours with large hiatal hernia. No pulmonary edema, pleural effusion, focal airspace disease or pneumothorax. The bones are under mineralized. IMPRESSION: Emphysema without acute abnormality. Known right lower lobe pulmonary nodule not well seen radiographically. Electronically Signed   By: Jeb Levering M.D.   On: 12/05/2016 21:51   Ct Angio Neck W Or Wo Contrast  Result Date: 12/05/2016 CLINICAL DATA:  76 y/o  F; nontraumatic neck pain. EXAM: CT ANGIOGRAPHY HEAD AND NECK TECHNIQUE: Multidetector CT  imaging of the head and neck was performed using the standard protocol during bolus administration of intravenous contrast. Multiplanar CT image reconstructions and MIPs were obtained to evaluate the vascular anatomy. Carotid stenosis measurements (when applicable) are obtained utilizing NASCET criteria, using the distal internal carotid diameter as the denominator. CONTRAST:  100 cc Isovue 370 COMPARISON:  03/18/2007 CT head FINDINGS: CT HEAD FINDINGS Brain: No evidence of acute infarction, hemorrhage, hydrocephalus, extra-axial collection or mass lesion/mass effect. Stable chronic lacunar infarct within the left anterior lentiform nucleus and periventricular white matter of left frontal lobe. Mild chronic microvascular ischemic changes and parenchymal volume loss of the brain. Vascular: Extensive calcific atherosclerosis of carotid siphons. Skull: Normal. Negative for fracture or focal lesion. Sinuses: Mild ethmoid sinus mucosal thickening. Right-greater-than-left mastoid effusions. Orbits: No acute finding. Review of the MIP images confirms the above findings CTA NECK FINDINGS Aortic arch: Standard branching. Imaged portion shows no evidence of aneurysm or dissection. Moderate calcific atherosclerosis of the aortic arch. Calcified plaque of left subclavian artery origin with 30% stenosis. Right carotid system: No evidence of dissection, stenosis (50% or greater) or occlusion. Mixed plaque of common carotid artery and of the carotid bifurcation with minimal less than 30% stenosis of proximal ICA. Left carotid system: Motion degraded left common carotid artery origin, question mild stenosis. Mixed plaque of common carotid artery and of the carotid bifurcation with mild 40% proximal ICA stenosis. Vertebral arteries: Mild left dominance. No evidence of dissection, stenosis (50% or greater) or occlusion. Calcified plaque of left distal V1 segment with mild stenosis. Skeleton: Mild cervical spondylosis for age greatest  at the C5 through C7 levels. No high-grade bony canal stenosis. Other neck: Subcentimeter nodules in the bilateral lobes of the thyroid gland. Upper chest: Moderate pulmonary emphysema. Review of the MIP images confirms the above findings CTA HEAD FINDINGS Anterior circulation: No significant stenosis, proximal occlusion, aneurysm, or vascular malformation. Moderate calcific atherosclerosis of bilateral cavernous and paraclinoid segments of  the internal carotid arteries with areas of mild stenosis. Posterior circulation: No significant stenosis, proximal occlusion, aneurysm, or vascular malformation. Venous sinuses: As permitted by contrast timing, patent. Anatomic variants: Patent anterior communicating artery and bilateral posterior communicating arteries. Delayed phase: No abnormal intracranial enhancement. Review of the MIP images confirms the above findings IMPRESSION: 1. No acute intracranial abnormality identified. No abnormal enhancement of the brain. 2. Mild chronic microvascular ischemic changes and mild parenchymal volume loss of the brain. 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 3. Right-greater-than-left mastoid air cell effusions. 4. Patent carotid and vertebral arteries. No dissection, aneurysm, or significant stenosis is identified. 5. Mixed plaque of bilateral carotid bifurcations with proximal ICA 30-40% mild stenosis. 6. No mass or inflammatory process of the neck identified. 7. Mild cervical spine spondylosis for age greatest at the C5 through C7 levels. No high-grade bony canal stenosis. 8. Patent circle of Willis. No large vessel occlusion, aneurysm, or significant stenosis is identified. Electronically Signed   By: Kristine Garbe M.D.   On: 12/05/2016 23:38   Ct Chest Wo Contrast  Result Date: 12/27/2016 CLINICAL DATA:  Chronic respiratory failure. EXAM: CT CHEST WITHOUT CONTRAST TECHNIQUE: Multidetector CT imaging of the chest was performed following the standard  protocol without IV contrast. COMPARISON:  10/09/2016 FINDINGS: Cardiovascular: Heart is enlarged. Coronary artery calcification is noted. Atherosclerotic calcification is noted in the wall of the thoracic aorta. Mediastinum/Nodes: Scattered small mediastinal lymph nodes. No evidence for gross hilar lymphadenopathy although assessment is limited by the lack of intravenous contrast on today's study. Fluid in the esophagus may be related to reflux or dysmotility. Patient is noted to have a moderate to large hiatal hernia. Lungs/Pleura: Centrilobular and paraseptal emphysema. Bilateral lower lobe collapse/ consolidation, right greater than left. Small bilateral pleural effusions, right greater than left. Upper Abdomen: Atherosclerotic calcification in the incompletely visualized abdominal aorta. Musculoskeletal: Bone windows reveal no worrisome lytic or sclerotic osseous lesions. IMPRESSION: 1. Bilateral lower lobe collapse/consolidation with bilateral pleural effusions. 2. 1 cm spiculated right lower lobe nodule seen on prior study obscured by the collapse/consolidation today. 3. Thoraco abdominal aortic atherosclerosis. 4. Emphysema. Electronically Signed   By: Misty Stanley M.D.   On: 12/27/2016 10:43   US Renal  Result Date: 12/26/2016 CLINICAL DATA:  Acute kidney injury EXAM: RENAL / URINARY TRACT ULTRASOUND COMPLETE COMPARISON:  CT from yesterday FINDINGS: Right Kidney: Length: 12 cm. Increased echogenicity. No hydronephrosis. Incidental 11 mm cyst. Left Kidney: Length: 12 cm.  Increased echogenicity.  No hydronephrosis or mass. Bladder: Not visualized.  Reportedly there is a Foley catheter present. IMPRESSION: 1. No hydronephrosis. 2. Echogenic cortex as with medical renal disease. Negative for atrophy. Electronically Signed   By: Monte Fantasia M.D.   On: 12/26/2016 19:01   Dg Chest Port 1 View  Result Date: 12/26/2016 CLINICAL DATA:  Status post central line placement today. EXAM: PORTABLE CHEST 1 VIEW  COMPARISON:  Single-view of the chest 12/24/2016. FINDINGS: New right IJ catheter is in place with the tip projecting in the mid to lower superior vena cava. No pneumothorax. Small right pleural effusion and basilar airspace disease are seen. The left lung appears clear. Heart size is normal. Atherosclerosis is identified. Hiatal hernia is noted. IMPRESSION: Tip of right IJ catheter projects in the mid to lower superior vena cava. No pneumothorax. Small right effusion and basilar airspace disease, likely atelectasis. Hiatal hernia. Atherosclerosis. Electronically Signed   By: Inge Rise M.D.   On: 12/26/2016 19:40   Dg  Chest Port 1 View  Result Date: 12/09/2016 CLINICAL DATA:  76 year old presenting with acute onset of shortness of breath. EXAM: PORTABLE CHEST 1 VIEW COMPARISON:  12/05/2016, 11/06/2014 and earlier, including CT chest 10/09/2016 and earlier. PET-CT 10/30/2016. FINDINGS: Suboptimal inspiration accounts for crowded bronchovascular markings diffusely and atelectasis in the bases, and accentuates the cardiac silhouette. Taking this into account, cardiac silhouette upper normal in size to slightly enlarged but stable. The previously identified large hiatal hernia is less conspicuous due to technique. Lungs otherwise clear. Pulmonary vascularity normal. No visible pleural effusions. IMPRESSION: Suboptimal inspiration accounts for bibasilar atelectasis. No acute cardiopulmonary disease otherwise. Stable borderline heart size. Electronically Signed   By: Evangeline Dakin M.D.   On: 11/25/2016 18:23    Microbiology Recent Results (from the past 240 hour(s))  Urine culture     Status: None   Collection Time: 11/28/2016 10:29 PM  Result Value Ref Range Status   Specimen Description URINE, RANDOM  Final   Special Requests NONE  Final   Culture   Final    NO GROWTH Performed at [facility], [address] [place], [place] [address]    Report Status 12/27/2016 FINAL  Final  MRSA PCR  Screening     Status: None   Collection Time: 12/20/2016 11:25 PM  Result Value Ref Range Status   MRSA by PCR NEGATIVE NEGATIVE Final    Comment:        The GeneXpert MRSA Assay (FDA approved for NASAL specimens only), is one component of a comprehensive MRSA colonization surveillance program. It is not intended to diagnose MRSA infection nor to guide or monitor treatment for MRSA infections.   Culture, blood (x 2)     Status: None (Preliminary result)   Collection Time: 12/26/16  6:19 AM  Result Value Ref Range Status   Specimen Description BLOOD LEFT ANTECUBITAL  Final   Special Requests IN PEDIATRIC BOTTLE Blood Culture adequate volume  Final   Culture   Final    NO GROWTH 1 DAY Performed at [facility], [place] [address], [place], [place] [address]    Report Status PENDING  Incomplete  Culture, blood (x 2)     Status: None (Preliminary result)   Collection Time: 12/26/16  7:01 AM  Result Value Ref Range Status   Specimen Description BLOOD LEFT ANTECUBITAL  Final   Special Requests IN PEDIATRIC BOTTLE Blood Culture adequate volume  Final   Culture   Final    NO GROWTH 1 DAY Performed at [facility], [place] [address], [place], [place] [address]    Report Status PENDING  Incomplete    Lab Basic Metabolic Panel:  Recent Labs Lab 11/25/2016 2326 12/26/16 0701 12/26/16 1239 12/27/16 0512 2017/01/20 0300  NA 135 136 137 136 138  K 6.4* 5.8* 5.4* 4.7 4.2  CL 109 110 112* 105 107  CO2 16* 14* 15* 22 19*  GLUCOSE 89 123* 177* 181* 129*  BUN 57* 54* 55* 55* 56*  CREATININE 3.06* 3.01* 2.99* 3.14* 3.10*  CALCIUM 7.5* 7.5* 7.2* 6.5* 5.8*  PHOS  --   --   --  4.1 4.5   Liver Function Tests:  Recent Labs Lab 12/04/2016 1958 12/27/16 0512 01-20-17 0300  AST 25 81*  --   ALT 19 31  --   ALKPHOS 229* 225*  --   BILITOT 0.6 0.6  --   PROT 6.1* 5.5*  --   ALBUMIN 1.8* 1.9* 1.8*  Recent Labs Lab 12/14/2016 1958  LIPASE 26   No results for input(s):  AMMONIA in the last 168 hours. CBC:  Recent Labs Lab 12/18/2016 1859 12/03/2016 1958 12/26/16 0701 12/26/16 1509 12/27/16 0512 01/06/2017 0300  WBC  --  14.5* 11.1*  --  14.3* 14.5*  NEUTROABS  --  11.3* 9.5*  --  11.2*  --   HGB 17.7* 7.6* 6.6*  --  7.3* 7.3*  HCT 52.0* 21.9* 19.5*  --  21.1* 20.9*  MCV  --  83.6 84.8  --  84.4 83.6  PLT  --  122* 101* 69* 71* 64*   Cardiac Enzymes:  Recent Labs Lab 12/26/16 0701 12/26/16 1239 12/26/16 1530 12/27/16 2049 January 06, 2017 0300  CKTOTAL  --   --  34*  --   --   TROPONINI 0.09* 0.08*  --  0.09* 0.09*   Sepsis Labs:  Recent Labs Lab 12/06/2016 1859 12/24/2016 1958 11/27/2016 2229 12/26/16 0701 12/27/16 0512 01-06-17 0300  PROCALCITON  --   --   --  2.59  --   --   WBC  --  14.5*  --  11.1* 14.3* 14.5*  LATICACIDVEN 1.69  --  1.4  --   --   --     Procedures/Operations  None   Chipper Oman, MD 06-Jan-2017, 8:55 AM

## 2017-01-25 DEATH — deceased

## 2017-04-21 ENCOUNTER — Ambulatory Visit: Payer: Medicare HMO | Admitting: Internal Medicine

## 2017-12-07 IMAGING — CT CT CHEST W/O CM
2 of 3 series · 15 of 36 positions shown, 18 images · non-contrast
Comparison: 10/09/2016

CLINICAL DATA: Chronic respiratory failure.

EXAM:
CT CHEST WITHOUT CONTRAST
TECHNIQUE: Multidetector CT imaging of the chest was performed following the
standard protocol without IV contrast.

[Series 2: thorax · axial · 0.67mm/px · z∈[-331,-99]mm · 12 of 138 slices shown, 15 images]
[im 11/138  mediastinal]
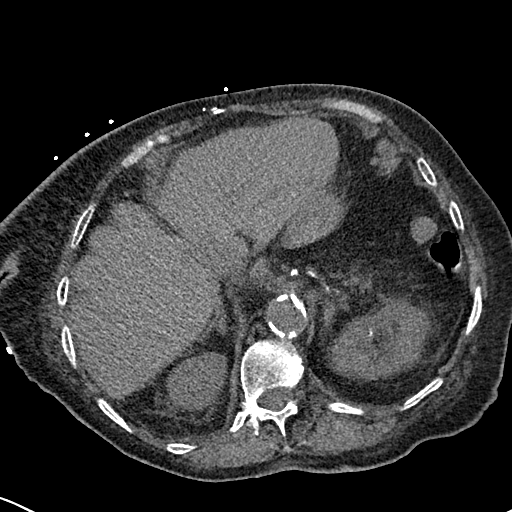
[im 11/138  lung]
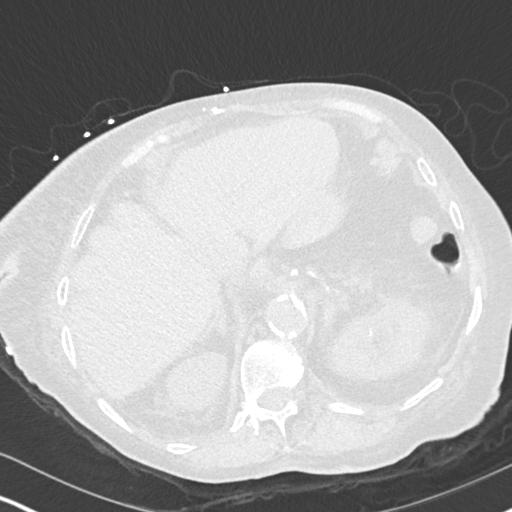
[im 21/138  lung]
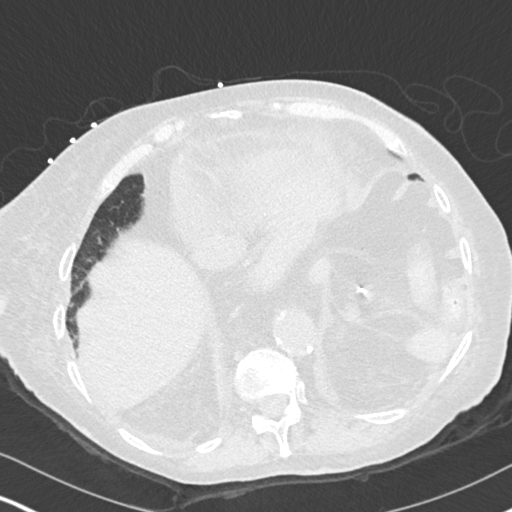
[im 31/138  lung]
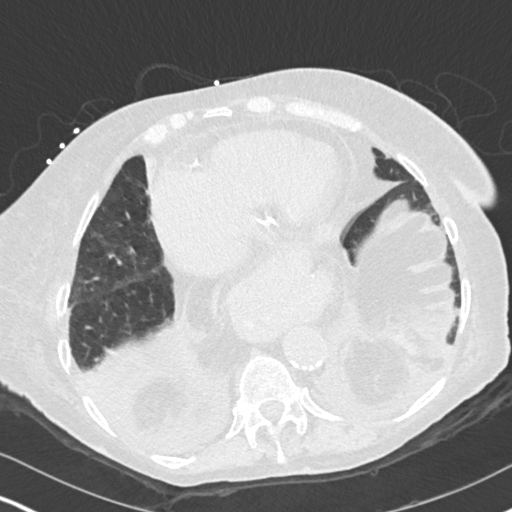
[im 41/138  lung]
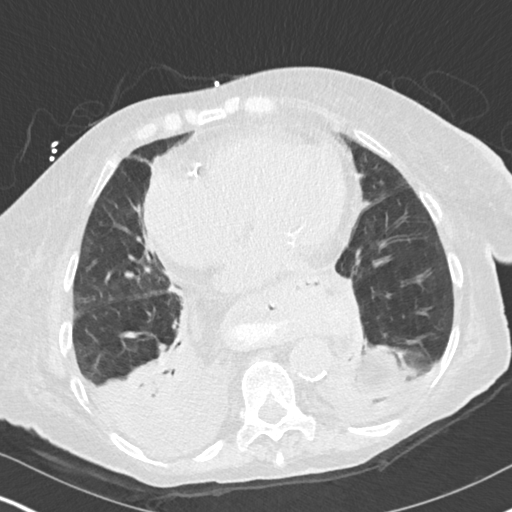
[im 51/138  mediastinal]
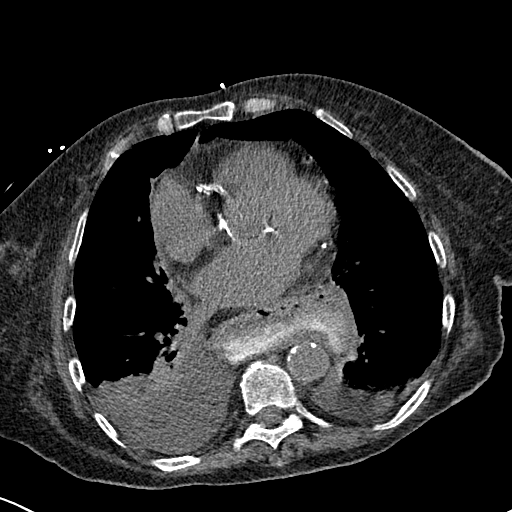
[im 51/138  lung]
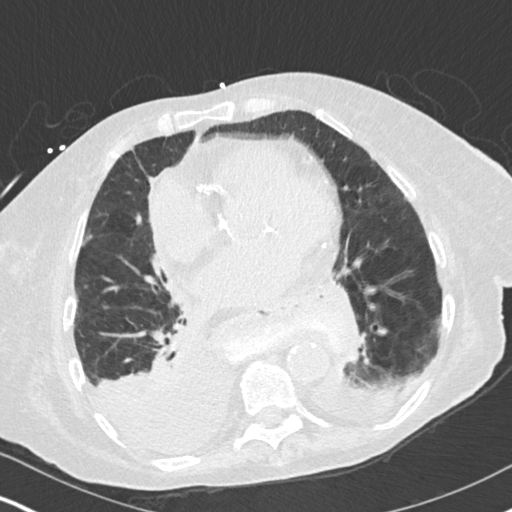
[im 61/138  lung]
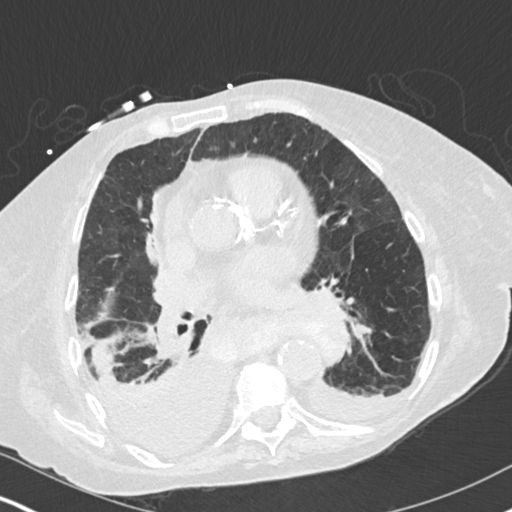
[im 77/138  lung]
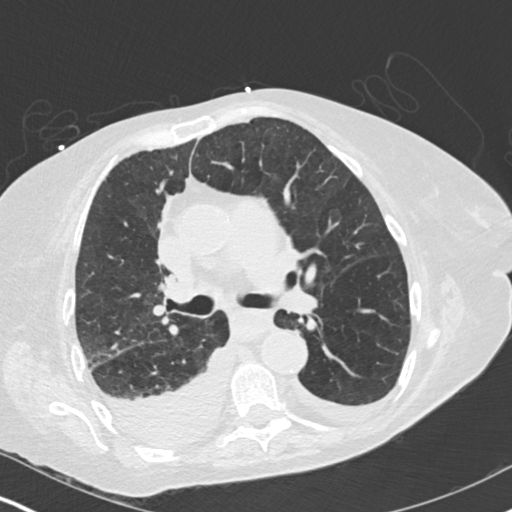
[im 87/138  lung]
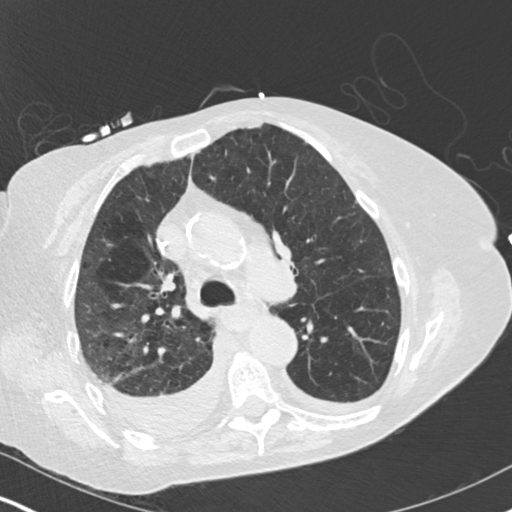
[im 97/138  mediastinal]
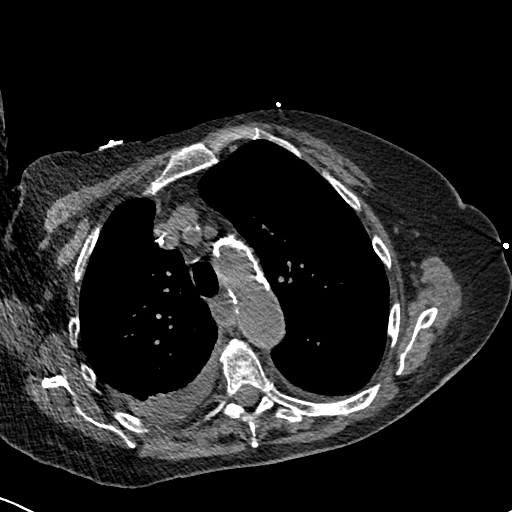
[im 97/138  lung]
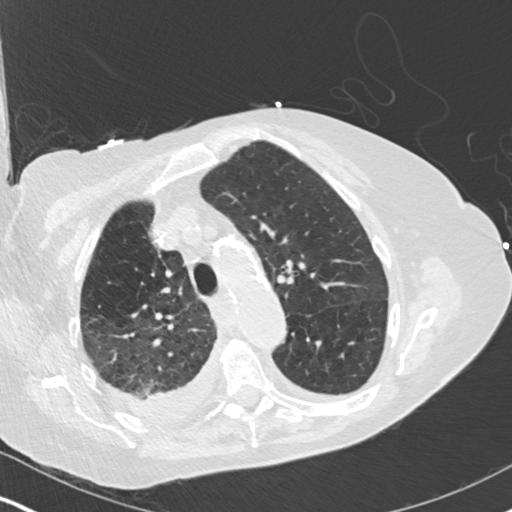
[im 107/138  lung]
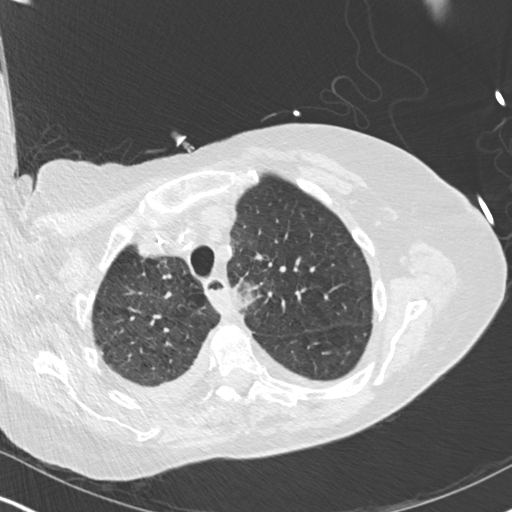
[im 117/138  lung]
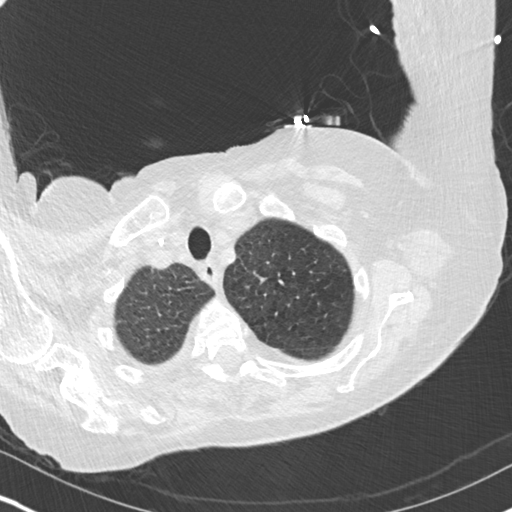
[im 127/138  lung]
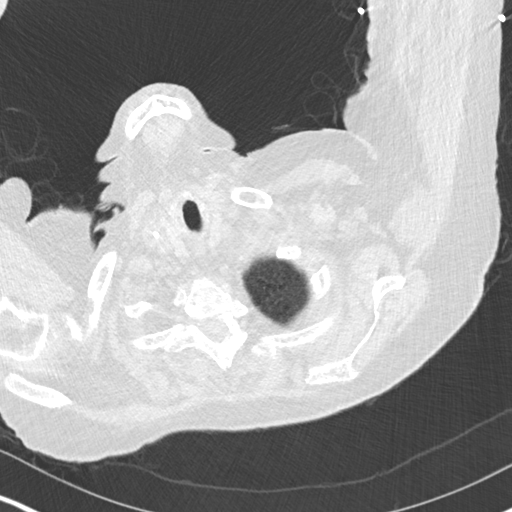

[Series 6: coronal · coronal · 0.56mm/px · 3 of 130 slices shown]
[im 26/130  lung]
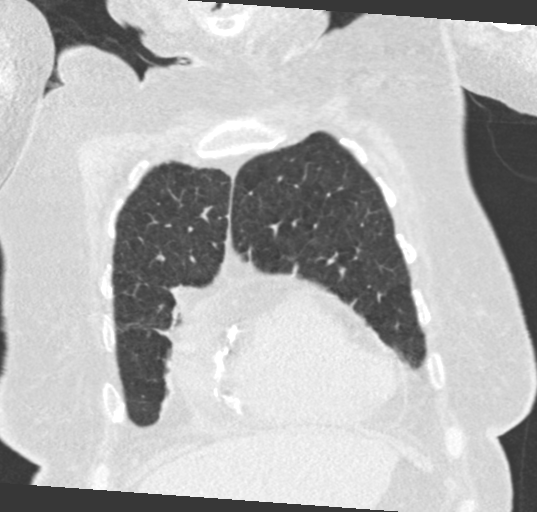
[im 52/130  lung]
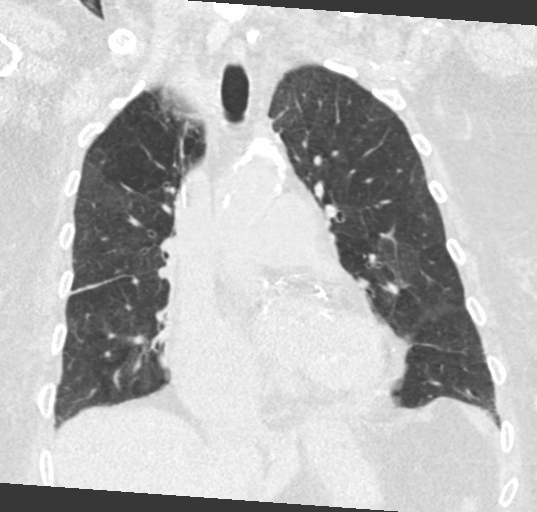
[im 78/130  lung]
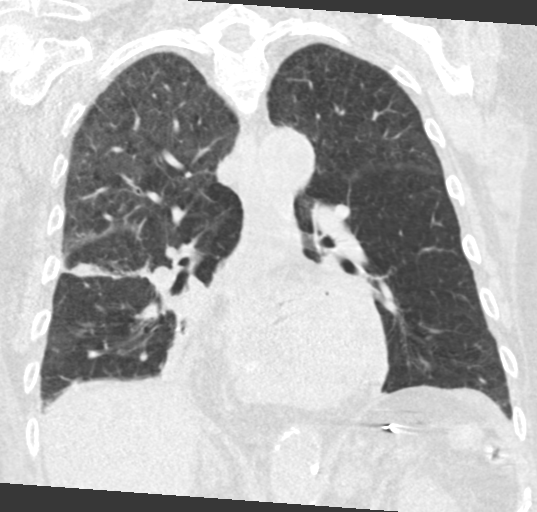

[15 of 36 positions shown; findings below may reference images not displayed]

FINDINGS: Cardiovascular: Heart is enlarged. Coronary artery calcification is
noted. Atherosclerotic calcification is noted in the wall of the
thoracic aorta.

Mediastinum/Nodes: Scattered small mediastinal lymph nodes. No
evidence for gross hilar lymphadenopathy although assessment is
limited by the lack of intravenous contrast on today's study. Fluid
in the esophagus may be related to reflux or dysmotility. Patient is
noted to have a moderate to large hiatal hernia.

Lungs/Pleura: Centrilobular and paraseptal emphysema. Bilateral
lower lobe collapse/ consolidation, right greater than left. Small
bilateral pleural effusions, right greater than left.

Upper Abdomen: Atherosclerotic calcification in the incompletely
visualized abdominal aorta.

Musculoskeletal: Bone windows reveal no worrisome lytic or sclerotic
osseous lesions.
IMPRESSION: 1. Bilateral lower lobe collapse/consolidation with bilateral
pleural effusions.
2. 1 cm spiculated right lower lobe nodule seen on prior study
obscured by the collapse/consolidation today.
3. Thoraco abdominal aortic atherosclerosis.
4. Emphysema.
